# Patient Record
Sex: Female | Born: 1950 | Race: White | Hispanic: No | Marital: Married | Smoking: Former smoker
Health system: Southern US, Community
[De-identification: ages and names within clinical notes are randomized; demographics above are authoritative.]

## PROBLEM LIST (undated history)

## (undated) DIAGNOSIS — G43909 Migraine, unspecified, not intractable, without status migrainosus: Secondary | ICD-10-CM

## (undated) DIAGNOSIS — M1990 Unspecified osteoarthritis, unspecified site: Secondary | ICD-10-CM

## (undated) DIAGNOSIS — F329 Major depressive disorder, single episode, unspecified: Secondary | ICD-10-CM

## (undated) DIAGNOSIS — R0600 Dyspnea, unspecified: Secondary | ICD-10-CM

## (undated) DIAGNOSIS — Z952 Presence of prosthetic heart valve: Secondary | ICD-10-CM

## (undated) DIAGNOSIS — I4819 Other persistent atrial fibrillation: Secondary | ICD-10-CM

## (undated) DIAGNOSIS — I493 Ventricular premature depolarization: Secondary | ICD-10-CM

## (undated) DIAGNOSIS — I5022 Chronic systolic (congestive) heart failure: Secondary | ICD-10-CM

## (undated) DIAGNOSIS — I428 Other cardiomyopathies: Secondary | ICD-10-CM

## (undated) DIAGNOSIS — F419 Anxiety disorder, unspecified: Secondary | ICD-10-CM

## (undated) DIAGNOSIS — I509 Heart failure, unspecified: Secondary | ICD-10-CM

## (undated) DIAGNOSIS — F32A Depression, unspecified: Secondary | ICD-10-CM

## (undated) HISTORY — DX: Migraine, unspecified, not intractable, without status migrainosus: G43.909

## (undated) HISTORY — DX: Presence of prosthetic heart valve: Z95.2

## (undated) HISTORY — DX: Chronic systolic (congestive) heart failure: I50.22

## (undated) HISTORY — DX: Anxiety disorder, unspecified: F41.9

## (undated) HISTORY — PX: TONSILLECTOMY AND ADENOIDECTOMY: SUR1326

## (undated) HISTORY — DX: Other persistent atrial fibrillation: I48.19

## (undated) HISTORY — DX: Ventricular premature depolarization: I49.3

---

## 1998-11-06 ENCOUNTER — Other Ambulatory Visit: Admission: RE | Admit: 1998-11-06 | Discharge: 1998-11-06 | Payer: Self-pay | Admitting: Obstetrics and Gynecology

## 2003-03-15 HISTORY — PX: EYE SURGERY: SHX253

## 2003-03-18 ENCOUNTER — Other Ambulatory Visit: Admission: RE | Admit: 2003-03-18 | Discharge: 2003-03-18 | Payer: Self-pay | Admitting: Obstetrics and Gynecology

## 2004-08-30 ENCOUNTER — Other Ambulatory Visit: Admission: RE | Admit: 2004-08-30 | Discharge: 2004-08-30 | Payer: Self-pay | Admitting: Obstetrics and Gynecology

## 2005-10-26 ENCOUNTER — Other Ambulatory Visit: Admission: RE | Admit: 2005-10-26 | Discharge: 2005-10-26 | Payer: Self-pay | Admitting: Obstetrics and Gynecology

## 2006-10-23 ENCOUNTER — Encounter: Admission: RE | Admit: 2006-10-23 | Discharge: 2006-10-23 | Payer: Self-pay | Admitting: Obstetrics and Gynecology

## 2006-12-12 ENCOUNTER — Other Ambulatory Visit: Admission: RE | Admit: 2006-12-12 | Discharge: 2006-12-12 | Payer: Self-pay | Admitting: Obstetrics and Gynecology

## 2008-02-01 ENCOUNTER — Emergency Department (HOSPITAL_COMMUNITY): Admission: EM | Admit: 2008-02-01 | Discharge: 2008-02-01 | Payer: Self-pay | Admitting: Emergency Medicine

## 2008-03-28 ENCOUNTER — Other Ambulatory Visit: Admission: RE | Admit: 2008-03-28 | Discharge: 2008-03-28 | Payer: Self-pay | Admitting: Obstetrics and Gynecology

## 2010-04-04 ENCOUNTER — Encounter: Payer: Self-pay | Admitting: Obstetrics and Gynecology

## 2010-07-27 NOTE — Consult Note (Signed)
Wanda [name], Wanda [name] NO.:  [id number]   MEDICAL RECORD NO.:  [id number]          PATIENT TYPE:  EMS   LOCATION:  [facility]                         FACILITY:  [facility]   PHYSICIAN:  Nicki [name], M.D.     DATE OF BIRTH:  [birth year]   DATE OF CONSULTATION:  DATE OF DISCHARGE:  02/01/2008                                 CONSULTATION   PATIENT PROFILE:  Wanda [name] is a 60 year old female who is referred to  [facility] Emergency Room by Wanda [name] for evaluation of chest  pain.  The patient was also seen by Wanda [name] in the [facility]  Emergency Room and cardiology evaluation was requested.   HISTORY OF PRESENT ILLNESS:  Wanda [name] is a 60 year old.  She  denies any known prior history of cardiac disease.  She admits to being  very healthy for most of her life.  On Wednesday evening, 2 days ago,  she first developed an episode of chest discomfort, which seemed to be  lower substernal/epigastric associated with mild burning.  She took Tums  with relief.  On Thursday evening, she again developed recurrent  episodes of chest discomfort.  At times, it felt like a burning.  It  seemed to go up epigastric to the chest and possibly back.  She did take  Tums with some mild benefit, but not complete relief.  This persisted  for most of the night.  It was not associated with any shortness of  breath or diaphoresis.  She saw Wanda [name] this morning at Urgent Care and  presented from there to [facility] for further evaluation.  She  has been pain free through the entire day.  She was evaluated in the  emergency room by Wanda [name] who felt most likely her pain was  GI.  She was given Protonix and has not had any recurrence of  discomfort.  Upon further questioning, the patient denies any exertional  component to any of her chest pain.  She denies any exertional shortness  of breath.  There is no history of known gallbladder disease.  She  denies any history of  known pancreatitis.   She admits to being allergic to DUST.   Her current medications include a transdermal estrogen patch as well as  oral progesterone.  She denies any prior surgical illness or any  surgeries.   Socially, she is the sister of a former patient of mine, Wanda [name], who unfortunately passed away in 2009/02/16secondary to  lung CA.  He did have aortic stenosis and had undergone aortic valve  replacement surgery.  She is married for 33 years.  She has 2 children,  ages 39 and 13, who are healthy.  She does not smoke.  She only drinks  alcohol rarely on a social basis.   FAMILY HISTORY:  Notable that her mother is aged 66, living in an  assisted living and has spinal stenosis.  Father died at age 23 with an  abdominal aortic aneurysm.  She had 1 brother, Wanda Hua  Mayford [name], who  passed away.  She has 1 sister who is well.   REVIEW OF SYSTEMS:  Negative for fever, chills, or night sweats.  She  recently was started on Adderall 20 mg approximately 6 weeks ago for  possible attention deficit disorder.  She does work in the [employer].  She denies any wheezing.  She denies any  shortness of breath.  She denies any tachy palpitations.  She is unaware  of any known heart murmur.  She denies any fatty food intolerance.  She  denies abdominal pain.  She denies any GI or GU history.  She denies any  cramps in her legs with walking.  She denies any calf swelling or  tenderness.  She denies edema.  She denies dysuria.   PHYSICAL EXAMINATION:  GENERAL:  She is a very pleasant 60 year old  female who is in no acute distress and looks comfortable.  VITAL SIGNS:  Her blood pressure was 120/78, pulse was regular at 74,  and respirations were 16.  HEENT:  Unremarkable.  She was normocephalic and atraumatic.  Pupils  were equal, round, and reactive to light and accommodation.  Her mouth  and pharynx were benign.  NECK:  There was no JVD.  She did not  have thyroid nodules.  CHEST:  She did not have chest wall tenderness to palpation.  LUNGS:  Clear.  HEART:  Rhythm was regular.  There was a faint 1/6 systolic murmur along  the left sternal border.  There was no S3 gallop.  There was no S4.  ABDOMEN:  She did not have epigastric tenderness to palpation.  She did  not have right upper quadrant tenderness to palpation.  Abdominal aorta  was not increased by palpation.  EXTREMITIES:  Pulses were 2+.  She had negative Homans sign.  She did  not have any calf tenderness.  She did not have any cords.  There was no  edema.   Her EKG from Urgent Care was reviewed which showed sinus rhythm at 72  beats per minute, even though the computer had read it as accelerated  junctional rhythm, which it was not.  There were nonspecific acute  changes.   I did repeat an EKG done at [facility] Emergency Room at 1446, which  shows normal sinus rhythm.  She has mild RV conduction delay, but there  are no significant ST-T changes.   Hemoglobin was 12.1, hematocrit 36.2, platelet count 264,000, and white  count was normal at 6.7.  Sodium 141, potassium 4.8, chloride 105, CO2  of 30, glucose 87, BUN 14, and creatinine 0.89.  Live function studies  were normal.  Lipase was normal at 19.  Urinalysis was notable in that  she did have 21-50 white blood cells with few bacteria.  A culture was  sent.  Oxygen by pulse oximetry was 99% saturation.   My impression is that [name] a very pleasant 60 year old female  who denies any known cardiac history.  She recently had a 2-day history  of chest discomfort typically with lying down, on both instances after  eating and initially relieved with Tums.  She was given a Protonix  earlier today in the emergency room and has been pain free ever since.  Her subsequent EKG was normal.  We will obtain cardiac markers.  If the  cardiac markers are negative, she will then be sent home today and will  be given a  prescription for empiric Protonix 40  mg as well as baby  aspirin.  She will also be given a prescription for Macrobid for 1 week.  She will be also told to take a baby aspirin 81 mg.  If her cardiac  markers are equivocal, she will be kept overnight for serial enzymes.  We plan to perform an outpatient nuclear stress test and 2-D echo  Doppler study early next week for further evaluation to make certain  this is not on any cardiac origin.  In the future, she may ultimately  need an abdominal ultrasound/gallbladder ultrasound.  Further  recommendation will be forthcoming upon the completion of this study.           ______________________________  Nicki [name], M.D.     TK/MEDQ  D:  02/01/2008  T:  02/02/2008  Job:  [id number]   cc:   Brett Canales A. Cleta [name], M.D.

## 2010-12-15 LAB — POCT CARDIAC MARKERS
CKMB, poc: <1 — ABNORMAL LOW
Myoglobin, poc: 33.4

## 2010-12-15 LAB — COMPREHENSIVE METABOLIC PANEL
AST: 15
Albumin: 3.7
Alkaline Phosphatase: 35 — ABNORMAL LOW
Chloride: 105
GFR calc Af Amer: >60
Potassium: 4.8
Sodium: 141
Total Bilirubin: 0.7
Total Protein: 5.5 — ABNORMAL LOW

## 2010-12-15 LAB — URINALYSIS, ROUTINE W REFLEX MICROSCOPIC
Bilirubin Urine: NEGATIVE
Hgb urine dipstick: NEGATIVE
Ketones, ur: NEGATIVE
Protein, ur: NEGATIVE
Urobilinogen, UA: 0.2

## 2010-12-15 LAB — DIFFERENTIAL
Basophils Absolute: 0
Basophils Relative: 0
Eosinophils Relative: 3
Monocytes Absolute: 0.5
Monocytes Relative: 7

## 2010-12-15 LAB — CBC
Platelets: 264
RDW: 13.5
WBC: 6.7

## 2010-12-15 LAB — CK TOTAL AND CKMB (NOT AT ARMC)
CK, MB: 0.7
Total CK: 23

## 2010-12-15 LAB — URINE CULTURE: Colony Count: 1000

## 2010-12-15 LAB — URINE MICROSCOPIC-ADD ON

## 2013-04-26 ENCOUNTER — Encounter: Payer: Self-pay | Admitting: Obstetrics & Gynecology

## 2013-05-14 ENCOUNTER — Ambulatory Visit: Payer: Self-pay | Admitting: Obstetrics and Gynecology

## 2013-05-14 ENCOUNTER — Ambulatory Visit: Payer: Self-pay | Admitting: Obstetrics & Gynecology

## 2013-05-27 ENCOUNTER — Encounter: Payer: Self-pay | Admitting: Obstetrics & Gynecology

## 2013-05-28 ENCOUNTER — Other Ambulatory Visit: Payer: Self-pay | Admitting: Obstetrics & Gynecology

## 2013-05-28 ENCOUNTER — Encounter: Payer: Self-pay | Admitting: Obstetrics & Gynecology

## 2013-05-28 ENCOUNTER — Ambulatory Visit (INDEPENDENT_AMBULATORY_CARE_PROVIDER_SITE_OTHER): Payer: BC Managed Care – PPO | Admitting: Obstetrics & Gynecology

## 2013-05-28 VITALS — BP 138/62 | HR 60 | Resp 16 | Ht 64.0 in | Wt 138.0 lb

## 2013-05-28 DIAGNOSIS — Z1239 Encounter for other screening for malignant neoplasm of breast: Secondary | ICD-10-CM

## 2013-05-28 DIAGNOSIS — E559 Vitamin D deficiency, unspecified: Secondary | ICD-10-CM

## 2013-05-28 DIAGNOSIS — Z0000 Encounter for general adult medical examination without abnormal findings: Secondary | ICD-10-CM

## 2013-05-28 DIAGNOSIS — Z01419 Encounter for gynecological examination (general) (routine) without abnormal findings: Secondary | ICD-10-CM

## 2013-05-28 DIAGNOSIS — E2839 Other primary ovarian failure: Secondary | ICD-10-CM

## 2013-05-28 DIAGNOSIS — Z23 Encounter for immunization: Secondary | ICD-10-CM

## 2013-05-28 DIAGNOSIS — Z1231 Encounter for screening mammogram for malignant neoplasm of breast: Secondary | ICD-10-CM

## 2013-05-28 DIAGNOSIS — Z1211 Encounter for screening for malignant neoplasm of colon: Secondary | ICD-10-CM

## 2013-05-28 LAB — HEMOGLOBIN, FINGERSTICK: Hemoglobin, fingerstick: 12.9 g/dL (ref 12.0–16.0)

## 2013-05-28 MED ORDER — ESTRADIOL 0.05 MG/24HR TD PTTW: 1.0000 | MEDICATED_PATCH | TRANSDERMAL | Status: DC

## 2013-05-28 MED ORDER — PROGESTERONE MICRONIZED 100 MG PO CAPS: 100.0000 mg | ORAL_CAPSULE | Freq: Every day | ORAL | Status: DC

## 2013-05-28 NOTE — Patient Instructions (Signed)

## 2013-05-28 NOTE — Progress Notes (Signed)
63 y.o. G3P2 MarriedCaucasianF here for annual exam.  Has had a lot going on with family.  Has been stretching the dosage out by using the patch for more than three or four days.  No vaginal bleeding.  Doesn't have hot flashes even when she misses changing her patch.  Patient's last menstrual period was 08/12/2004.          Sexually active: yes  The current method of family planning is vasectomy.    Exercising: yes  walking and exercise classes Smoker:  no  Health Maintenance: Pap:  05/11/12 WNL/negative HR HPV History of abnormal Pap:  no MMG:  10/23/06-normal Colonoscopy:  none BMD:   none TDaP:  today Screening Labs: today, Hb today: 12.9, Urine today: WBC-trace   reports that she has quit smoking. She has never used smokeless tobacco. She reports that she drinks alcohol. She reports that she does not use illicit drugs.  Past Medical History  Diagnosis Date  . Anxiety   . Migraine     Past Surgical History  Procedure Laterality Date  . Tonsillectomy and adenoidectomy      Current Outpatient Prescriptions  Medication Sig Dispense Refill  . ALPRAZolam (XANAX) 0.25 MG tablet Take 0.25 mg by mouth at bedtime as needed for anxiety.      Marland Kitchen. amphetamine-dextroamphetamine (ADDERALL) 20 MG tablet       . estradiol (VIVELLE-DOT) 0.05 MG/24HR patch Place 1 patch onto the skin 2 (two) times a week.      Marland Kitchen. ibuprofen (ADVIL,MOTRIN) 200 MG tablet Take 200 mg by mouth every 6 (six) hours as needed.      . progesterone (PROMETRIUM) 100 MG capsule Take 100 mg by mouth daily.      . Pseudoephedrine HCl (SUDAFED PO) Take by mouth as needed.      . rizatriptan (MAXALT-MLT) 10 MG disintegrating tablet Take 10 mg by mouth as needed for migraine. May repeat in 2 hours if needed       No current facility-administered medications for this visit.    Family History  Problem Relation Age of Onset  . Diabetes Mother   . Ovarian cancer Maternal Grandmother 80  . Breast cancer Sister [id number]  . Congestive  Heart Failure Maternal Grandfather   . Heart disease Brother     valve replacement  . Deep vein thrombosis Father   . Deep vein thrombosis Brother   . Deep vein thrombosis Paternal Aunt     x2  . Deep vein thrombosis Paternal Grandmother   . Lung cancer Brother     ROS:  Pertinent items are noted in HPI.  Otherwise, a comprehensive ROS was negative.  Exam:   BP 138/62  Pulse 60  Resp 16  Ht 5\' 4"  (1.626 m)  Wt 138 lb (62.596 kg)  BMI 23.68 kg/m2  LMP 08/12/2004  Weight change: +4lbs  Height: 5\' 4"  (162.6 cm)  Ht Readings from Last 3 Encounters:  05/28/13 5\' 4"  (1.626 m)    General appearance: alert, cooperative and appears stated age Head: Normocephalic, without obvious abnormality, atraumatic Neck: no adenopathy, supple, symmetrical, trachea midline and thyroid normal to inspection and palpation Lungs: clear to auscultation bilaterally Breasts: normal appearance, no masses or tenderness Heart: regular rate and rhythm Abdomen: soft, non-tender; bowel sounds normal; no masses,  no organomegaly Extremities: extremities normal, atraumatic, no cyanosis or edema Skin: Skin color, texture, turgor normal. No rashes or lesions Lymph nodes: Cervical, supraclavicular, and axillary nodes normal. No abnormal inguinal nodes palpated Neurologic:  Grossly normal   Pelvic: External genitalia:  no lesions              Urethra:  normal appearing urethra with no masses, tenderness or lesions              Bartholins and Skenes: normal                 Vagina: normal appearing vagina with normal color and discharge, no lesions              Cervix: no lesions              Pap taken: no Bimanual Exam:  Uterus:  normal size, contour, position, consistency, mobility, non-tender              Adnexa: normal adnexa and no mass, fullness, tenderness               Rectovaginal: Confirms               Anus:  normal sphincter tone, no lesions  A:  Well Woman with normal exam PMP, on  HRT Anxiety Migraines  P:   Mammogram and BMD scheduled 06/12/13 at 2pm for pt at breast center pap smear with neg HR HPV 2/14.  No pap today. Rx for one month for minivelle 0.05mg  twice weekly and prometrium 100mg  daily.  Will RF for year when MMG is done. Tdap today. Declines colonoscopy.  Ifob given. return annually or prn  An After Visit Summary was printed and given to the patient.

## 2013-05-29 LAB — LIPID PANEL
Cholesterol: 203 mg/dL — ABNORMAL HIGH (ref 0–200)
HDL: 80 mg/dL (ref >39)
LDL CALC: 112 mg/dL — AB (ref 0–99)
TRIGLYCERIDES: 53 mg/dL (ref <150)
Total CHOL/HDL Ratio: 2.5 Ratio
VLDL: 11 mg/dL (ref 0–40)

## 2013-05-29 LAB — COMPREHENSIVE METABOLIC PANEL
ALBUMIN: 4.6 g/dL (ref 3.5–5.2)
ALT: 9 U/L (ref 0–35)
AST: 13 U/L (ref 0–37)
Alkaline Phosphatase: 41 U/L (ref 39–117)
BUN: 12 mg/dL (ref 6–23)
CALCIUM: 9 mg/dL (ref 8.4–10.5)
CHLORIDE: 104 meq/L (ref 96–112)
CO2: 26 mEq/L (ref 19–32)
Creat: 1.05 mg/dL (ref 0.50–1.10)
GLUCOSE: 87 mg/dL (ref 70–99)
POTASSIUM: 4.3 meq/L (ref 3.5–5.3)
Sodium: 138 mEq/L (ref 135–145)
Total Bilirubin: 0.4 mg/dL (ref 0.2–1.2)
Total Protein: 6.7 g/dL (ref 6.0–8.3)

## 2013-05-29 LAB — TSH: TSH: 1.554 u[IU]/mL (ref 0.350–4.500)

## 2013-05-29 LAB — VITAMIN D 25 HYDROXY (VIT D DEFICIENCY, FRACTURES): Vit D, 25-Hydroxy: 12 ng/mL — ABNORMAL LOW (ref 30–89)

## 2013-06-04 MED ORDER — VITAMIN D (ERGOCALCIFEROL) 1.25 MG (50000 UNIT) PO CAPS: 50000.0000 [IU] | ORAL_CAPSULE | ORAL | Status: DC

## 2013-06-10 ENCOUNTER — Telehealth: Payer: Self-pay | Admitting: Obstetrics & Gynecology

## 2013-06-10 NOTE — Telephone Encounter (Signed)
Patient calling requesting refills for generic Maxalt be sent to [facility].

## 2013-06-10 NOTE — Telephone Encounter (Signed)
Patient also requesting information on where we scheduled her for her screening MMG this year? She says we scheduled it for her at the last visit.

## 2013-06-10 NOTE — Telephone Encounter (Signed)
Dr. Hyacinth Meeker, can you review? Patient requests refill for Maxalt.   Triage RN: Patient scheduled [facility] for 06/12/13 at 1400.

## 2013-06-11 MED ORDER — RIZATRIPTAN BENZOATE 10 MG PO TBDP: 10.0000 mg | ORAL_TABLET | ORAL | Status: DC | PRN

## 2013-06-11 NOTE — Telephone Encounter (Signed)
Spoke with patient. Patient advised that she has her Mammogram and Dexa scheduled at the [facility] for 06/12/13 at 1400 and that her refill for Maxalt was sent to [facility]. Patient verbalizes understanding and is agreeable.  Routing to provider for final review. Patient agreeable to disposition. Will close encounter

## 2013-06-11 NOTE — Telephone Encounter (Signed)
RX refill for maxalt done to [facility].

## 2013-06-12 ENCOUNTER — Ambulatory Visit
Admission: RE | Admit: 2013-06-12 | Discharge: 2013-06-12 | Disposition: A | Discharge status: Home / Self Care | Payer: BC Managed Care – PPO | Source: Ambulatory Visit | Attending: Obstetrics & Gynecology | Admitting: Obstetrics & Gynecology

## 2013-06-12 DIAGNOSIS — Z1231 Encounter for screening mammogram for malignant neoplasm of breast: Secondary | ICD-10-CM

## 2013-06-12 DIAGNOSIS — E2839 Other primary ovarian failure: Secondary | ICD-10-CM

## 2013-06-14 ENCOUNTER — Other Ambulatory Visit: Payer: Self-pay | Admitting: Obstetrics & Gynecology

## 2013-06-14 DIAGNOSIS — R928 Other abnormal and inconclusive findings on diagnostic imaging of breast: Secondary | ICD-10-CM

## 2013-06-20 ENCOUNTER — Ambulatory Visit
Admission: RE | Admit: 2013-06-20 | Discharge: 2013-06-20 | Disposition: A | Discharge status: Home / Self Care | Payer: BC Managed Care – PPO | Source: Ambulatory Visit | Attending: Obstetrics & Gynecology | Admitting: Obstetrics & Gynecology

## 2013-06-20 DIAGNOSIS — R928 Other abnormal and inconclusive findings on diagnostic imaging of breast: Secondary | ICD-10-CM

## 2013-06-26 ENCOUNTER — Other Ambulatory Visit: Payer: BC Managed Care – PPO

## 2013-07-19 ENCOUNTER — Other Ambulatory Visit: Payer: Self-pay | Admitting: Obstetrics & Gynecology

## 2013-07-19 NOTE — Telephone Encounter (Signed)
eScribe request from [facility] for refill on PROMETRIUM & MINIVELLE Last filled - 05/28/13 X 1 MONTH Last AEX - 05/28/13 Next AEX - 06/02/14 Diagnostic MMG and [place] on 06/20/13 showed benign findings.   Per last OV note:   Rx for one month for minivelle 0.05mg  twice weekly and prometrium 100mg  daily. Will RF for year when MMG is done.  Please advise refills.

## 2013-07-26 ENCOUNTER — Telehealth: Payer: Self-pay | Admitting: Obstetrics & Gynecology

## 2013-07-26 NOTE — Telephone Encounter (Signed)
Patient said she got a letter from [facility] saying to call the office about results from bone density.

## 2013-07-30 NOTE — Telephone Encounter (Signed)
Attempted to call patient on mobile. Unable to leave message. Voicemail not set up.

## 2013-07-30 NOTE — Telephone Encounter (Signed)
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  Hips have a little more osteopenia present.  Needs to get 1200-1500mg  total calcium (dietary and supplement) and 800 IU Vit D.  Recheck BMD 2 years ------

## 2013-07-30 NOTE — Telephone Encounter (Signed)
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 Hips have a little more osteopenia present. Needs to get 1200-1500mg  total calcium (dietary and supplement) and 800 IU Vit D. Recheck BMD 2 years

## 2013-07-31 NOTE — Telephone Encounter (Signed)
Call to patient. She was given message from Dr. Hyacinth Meeker.  She declines to schedule follow up vitamin D level check. She states she is taking the weekly vitamin D but not a daily calcium or a daily vitamin D. Discussed need for daily calcium and vitamin D for good bone health.   She will call back to schedule recheck of vitamin D.   Routing to provider for final review. Patient agreeable to disposition. Will close encounter

## 2013-09-03 ENCOUNTER — Telehealth: Payer: Self-pay | Admitting: Obstetrics & Gynecology

## 2013-09-03 NOTE — Telephone Encounter (Signed)
Routing to Dr. Hyacinth Meeker as Wanda [name]. Patient to reception states she will call back to reschedule. Will close encounter.

## 2013-09-03 NOTE — Telephone Encounter (Signed)
Patient cancel her vit d check lab for thrusday said she would call back to reschedule but wanted to make sure it didn't fall through.

## 2013-09-05 ENCOUNTER — Other Ambulatory Visit: Payer: BC Managed Care – PPO

## 2013-09-23 ENCOUNTER — Encounter: Payer: Self-pay | Admitting: Obstetrics & Gynecology

## 2013-09-23 NOTE — Progress Notes (Signed)
Pt did not return ifob kit.

## 2013-10-10 ENCOUNTER — Telehealth: Payer: Self-pay

## 2013-10-10 NOTE — Telephone Encounter (Signed)
Spoke with patient-she was at the hospital with in laws-? Father in law or brother in law not doing well. I apologized for catching her there and let her know if there is anything she needs from [place] to call. She appreciated the call with the Vitamin D recheck reminder. States she will try to remember to call back to schedule this when things slow down. Should I keep this in my reminder for a couple of months and then call back or take out of box? Please advise.

## 2013-10-10 NOTE — Telephone Encounter (Signed)
Message copied by Elisha Headland on Thu Oct 10, 2013  1:19 PM ------      Message from: Jerene Bears      Created: Wed Oct 09, 2013 10:46 AM      Regarding: labs       Pt hasn't come for follow up Vit D check.  Order is placed.  Can you call her please?            MSM ------

## 2013-10-10 NOTE — Telephone Encounter (Signed)
OK to remove from in box.  Thank you for calling.

## 2013-12-17 ENCOUNTER — Telehealth: Payer: Self-pay

## 2013-12-17 NOTE — Telephone Encounter (Signed)
Message copied by Elisha Headland on Tue Dec 17, 2013  9:42 AM ------      Message from: Jerene Bears      Created: Mon Nov 25, 2013  7:12 AM      Regarding: Vit D       Pt hasn't come for follow up Vit D level.  Can you call her please?  Order is placed.  Thanks.            MSM ------

## 2013-12-17 NOTE — Telephone Encounter (Signed)
Spoke with patient-states she is aware that this needs to be checked and is not trying to make excuses, but her father in law did pass and her mother in law is not doing well. She had her mammogram and was recalled for extra views. States she has been dealing with a lot of things and completely forgot about the vitamin d recheck. She will call back to schedule this when she has a day open. Aware can call same day and get in.//kn

## 2013-12-17 NOTE — Telephone Encounter (Signed)
Thank you. Encounter closed. 

## 2014-01-13 ENCOUNTER — Encounter: Payer: Self-pay | Admitting: Obstetrics & Gynecology

## 2014-06-02 ENCOUNTER — Telehealth: Payer: Self-pay | Admitting: Obstetrics & Gynecology

## 2014-06-02 ENCOUNTER — Ambulatory Visit: Payer: Self-pay | Admitting: Obstetrics & Gynecology

## 2014-06-02 NOTE — Telephone Encounter (Signed)
Unable to reach patient to reschedule her AEX for today with Dr. Hyacinth Meeker that was cancelled due to a surgery conflict.

## 2014-06-09 ENCOUNTER — Ambulatory Visit (INDEPENDENT_AMBULATORY_CARE_PROVIDER_SITE_OTHER): Payer: BC Managed Care – PPO | Admitting: Obstetrics & Gynecology

## 2014-06-09 ENCOUNTER — Encounter: Payer: Self-pay | Admitting: Obstetrics & Gynecology

## 2014-06-09 VITALS — BP 128/66 | HR 84 | Resp 16 | Ht 64.0 in | Wt 137.0 lb

## 2014-06-09 DIAGNOSIS — Z1211 Encounter for screening for malignant neoplasm of colon: Secondary | ICD-10-CM

## 2014-06-09 DIAGNOSIS — Z124 Encounter for screening for malignant neoplasm of cervix: Secondary | ICD-10-CM

## 2014-06-09 DIAGNOSIS — Z01419 Encounter for gynecological examination (general) (routine) without abnormal findings: Secondary | ICD-10-CM | POA: Diagnosis not present

## 2014-06-09 DIAGNOSIS — E559 Vitamin D deficiency, unspecified: Secondary | ICD-10-CM

## 2014-06-09 DIAGNOSIS — Z0000 Encounter for general adult medical examination without abnormal findings: Secondary | ICD-10-CM

## 2014-06-09 LAB — POCT URINALYSIS DIPSTICK
BILIRUBIN UA: NEGATIVE
GLUCOSE UA: NEGATIVE
Ketones, UA: NEGATIVE
Leukocytes, UA: NEGATIVE
Nitrite, UA: NEGATIVE
PH UA: 7
Protein, UA: NEGATIVE
RBC UA: NEGATIVE
Urobilinogen, UA: NEGATIVE

## 2014-06-09 MED ORDER — RIZATRIPTAN BENZOATE 10 MG PO TBDP: 10.0000 mg | ORAL_TABLET | ORAL | Status: DC | PRN

## 2014-06-09 MED ORDER — ESTRADIOL 0.05 MG/24HR TD PTTW: MEDICATED_PATCH | TRANSDERMAL | Status: DC

## 2014-06-09 MED ORDER — PROGESTERONE MICRONIZED 100 MG PO CAPS: ORAL_CAPSULE | ORAL | Status: DC

## 2014-06-09 NOTE — Progress Notes (Signed)
64 y.o. G3P2 MarriedCaucasianF here for annual exam.  Doing well.  No emergent or urgent care issues this past year.  D/w pt last year's MMG and breast density finding.  Reports the last year was hard as she lost her father-in-law and her mother.  They lived here so she provided a lot of care.    Denies vaginal bleeding.    Patient's last menstrual period was 08/12/2004.          Sexually active: Yes.    The current method of family planning is post menopausal status.    Exercising: Yes.    Walking, cardio class Smoker:  no  Health Maintenance: Pap: 04/2012 neg. HR HPV:Neg History of abnormal Pap:  no MMG: 06/20/13 [place] Right BIRADS2:Benign  Self Breast Exam: yes, every month.  Colonoscopy:  Never BMD:  06/2013, 0.3/-2.0 TDaP: 2015 Screening Labs: Will discuss with provider first, Hb today: , Urine today: pending   reports that she has quit smoking. She has never used smokeless tobacco. She reports that she drinks alcohol. She reports that she does not use illicit drugs.  Past Medical History  Diagnosis Date  . Anxiety   . Migraine     Past Surgical History  Procedure Laterality Date  . Tonsillectomy and adenoidectomy      Current Outpatient Prescriptions  Medication Sig Dispense Refill  . amphetamine-dextroamphetamine (ADDERALL) 20 MG tablet     . ibuprofen (ADVIL,MOTRIN) 200 MG tablet Take 200 mg by mouth every 6 (six) hours as needed.    Marland Kitchen LORazepam (ATIVAN) 0.5 MG tablet     . MINIVELLE 0.05 MG/24HR patch APPLY 1 PATCH 2 TIMES A WEEK AS DIRECTED. 8 patch 10  . Multiple Vitamin (MULTIVITAMIN) tablet Take 1 tablet by mouth daily.    . progesterone (PROMETRIUM) 100 MG capsule TAKE 1 CAPSULE AT BEDTIME. 30 capsule 10  . Pseudoephedrine HCl (SUDAFED PO) Take by mouth as needed.    . rizatriptan (MAXALT-MLT) 10 MG disintegrating tablet Take 1 tablet (10 mg total) by mouth as needed for migraine. May repeat in 2 hours if needed 9 tablet 11   No current facility-administered  medications for this visit.    Family History  Problem Relation Age of Onset  . Diabetes Mother   . Ovarian cancer Maternal Grandmother 80  . Breast cancer Sister 67  . Congestive Heart Failure Maternal Grandfather   . Heart disease Brother     valve replacement  . Deep vein thrombosis Father   . Deep vein thrombosis Brother   . Deep vein thrombosis Paternal Aunt     x2  . Deep vein thrombosis Paternal Grandmother   . Lung cancer Brother     ROS:  Pertinent items are noted in HPI.  Otherwise, a comprehensive ROS was negative.  Exam:    General appearance: alert, cooperative and appears stated age Head: Normocephalic, without obvious abnormality, atraumatic Neck: no adenopathy, supple, symmetrical, trachea midline and thyroid normal to inspection and palpation Lungs: clear to auscultation bilaterally Breasts: normal appearance, no masses or tenderness Heart: regular rate and rhythm Abdomen: soft, non-tender; bowel sounds normal; no masses,  no organomegaly Extremities: extremities normal, atraumatic, no cyanosis or edema Skin: Skin color, texture, turgor normal. No rashes or lesions Lymph nodes: Cervical, supraclavicular, and axillary nodes normal. No abnormal inguinal nodes palpated Neurologic: Grossly normal   Pelvic: External genitalia:  no lesions              Urethra:  normal appearing urethra with  no masses, tenderness or lesions              Bartholins and Skenes: normal                 Vagina: normal appearing vagina with normal color and discharge, no lesions              Cervix: no lesions              Pap taken: Yes.   Bimanual Exam:  Uterus:  normal size, contour, position, consistency, mobility, non-tender              Adnexa: normal adnexa and no mass, fullness, tenderness               Rectovaginal: Confirms               Anus:  normal sphincter tone, no lesions  Chaperone was present for exam.  A:  Well Woman with normal exam PMP, on  HRT Anxiety Migraines Vit D deficiency   P: Mammogram yearly recommended.  D/W pt 3D MMG due to breast density pap smear with neg HR HPV 2/14. Pap today. RF for Minivelle 0.05mg  patches twice weekly and Prometrium  nightly.  RFs x 1 year. Declines colonoscopy. Ifob given.  Was given last year but she didn't return. Lipids, CMP, TSH last year.  Repeat Vit D today. Maxalt MLT  prn migraine, may repeat 48 hrs.  #9/13 RF. return annually or prn

## 2014-06-10 LAB — VITAMIN D 25 HYDROXY (VIT D DEFICIENCY, FRACTURES): VIT D 25 HYDROXY: 23 ng/mL — AB (ref 30–100)

## 2014-06-11 LAB — IPS PAP TEST WITH REFLEX TO HPV

## 2014-07-06 ENCOUNTER — Encounter: Payer: Self-pay | Admitting: Obstetrics & Gynecology

## 2014-07-06 NOTE — Progress Notes (Signed)
Fecal occult test not returned.  Letter written to be mailed 07/07/14.

## 2014-08-05 NOTE — Progress Notes (Signed)
Fecal occult testing not returned.  Letter was written to remind pt. 

## 2015-06-16 ENCOUNTER — Other Ambulatory Visit: Payer: Self-pay | Admitting: Obstetrics & Gynecology

## 2015-06-16 NOTE — Telephone Encounter (Signed)
Medication refill request: Minivelle 0.05 mg ; Progesterone 100 mg Last AEX:  06/09/2014 with SM  Next AEX: 08/18/15 with SM Last MMG (if hormonal medication request): 06/20/2013 right breast mass ; right breast u/s showed a simple appearing cyst in the right breast; no evidence of malignancy bi-rads 2: benign Refill authorized: Please advise

## 2015-07-28 ENCOUNTER — Other Ambulatory Visit: Payer: Self-pay | Admitting: Obstetrics & Gynecology

## 2015-07-28 NOTE — Telephone Encounter (Signed)
Medication refill request: Maxalt   Last AEX:  06-09-14  Next AEX: 08-18-15 Last MMG (if hormonal medication request): 06-20-13 U/S WNL  Refill authorized: please advise

## 2015-08-18 ENCOUNTER — Encounter: Payer: Self-pay | Admitting: Obstetrics & Gynecology

## 2015-08-18 ENCOUNTER — Ambulatory Visit (INDEPENDENT_AMBULATORY_CARE_PROVIDER_SITE_OTHER): Payer: BC Managed Care – PPO | Admitting: Obstetrics & Gynecology

## 2015-08-18 VITALS — BP 144/60 | HR 72 | Resp 18 | Ht 63.5 in | Wt 140.0 lb

## 2015-08-18 DIAGNOSIS — Z0000 Encounter for general adult medical examination without abnormal findings: Secondary | ICD-10-CM

## 2015-08-18 DIAGNOSIS — Z205 Contact with and (suspected) exposure to viral hepatitis: Secondary | ICD-10-CM

## 2015-08-18 DIAGNOSIS — Z01419 Encounter for gynecological examination (general) (routine) without abnormal findings: Secondary | ICD-10-CM | POA: Diagnosis not present

## 2015-08-18 DIAGNOSIS — Z1211 Encounter for screening for malignant neoplasm of colon: Secondary | ICD-10-CM

## 2015-08-18 LAB — HEMOCCULT GUIAC POC 1CARD (OFFICE): Fecal Occult Blood, POC: NEGATIVE

## 2015-08-18 LAB — COMPREHENSIVE METABOLIC PANEL
ALBUMIN: 4.6 g/dL (ref 3.6–5.1)
ALT: 12 U/L (ref 6–29)
AST: 16 U/L (ref 10–35)
Alkaline Phosphatase: 40 U/L (ref 33–130)
BILIRUBIN TOTAL: 0.4 mg/dL (ref 0.2–1.2)
BUN: 21 mg/dL (ref 7–25)
CALCIUM: 9.7 mg/dL (ref 8.6–10.4)
CHLORIDE: 104 mmol/L (ref 98–110)
CO2: 25 mmol/L (ref 20–31)
CREATININE: 1.19 mg/dL — AB (ref 0.50–0.99)
Glucose, Bld: 88 mg/dL (ref 65–99)
Potassium: 5.2 mmol/L (ref 3.5–5.3)
SODIUM: 139 mmol/L (ref 135–146)
TOTAL PROTEIN: 6.9 g/dL (ref 6.1–8.1)

## 2015-08-18 LAB — LIPID PANEL
CHOLESTEROL: 247 mg/dL — AB (ref 125–200)
HDL: 108 mg/dL (ref >=46)
LDL Cholesterol: 124 mg/dL (ref <130)
Total CHOL/HDL Ratio: 2.3 Ratio (ref <=5.0)
Triglycerides: 73 mg/dL (ref <150)
VLDL: 15 mg/dL (ref <30)

## 2015-08-18 LAB — CBC
HCT: 40.4 % (ref 35.0–45.0)
Hemoglobin: 13.6 g/dL (ref 11.7–15.5)
MCH: 30.9 pg (ref 27.0–33.0)
MCHC: 33.7 g/dL (ref 32.0–36.0)
MCV: 91.8 fL (ref 80.0–100.0)
MPV: 11.4 fL (ref 7.5–12.5)
PLATELETS: 291 10*3/uL (ref 140–400)
RBC: 4.4 MIL/uL (ref 3.80–5.10)
RDW: 13.3 % (ref 11.0–15.0)
WBC: 7.1 10*3/uL (ref 3.8–10.8)

## 2015-08-18 LAB — TSH: TSH: 1.39 m[IU]/L

## 2015-08-18 MED ORDER — RIZATRIPTAN BENZOATE 10 MG PO TBDP: ORAL_TABLET | ORAL | Status: DC

## 2015-08-18 MED ORDER — ESTRADIOL 0.05 MG/24HR TD PTTW: 1.0000 | MEDICATED_PATCH | TRANSDERMAL | Status: DC

## 2015-08-18 MED ORDER — PROGESTERONE MICRONIZED 100 MG PO CAPS: ORAL_CAPSULE | ORAL | Status: DC

## 2015-08-18 MED ORDER — CYCLOBENZAPRINE HCL 10 MG PO TABS: 10.0000 mg | ORAL_TABLET | Freq: Three times a day (TID) | ORAL | Status: DC | PRN

## 2015-08-18 NOTE — Progress Notes (Signed)
65 y.o. G3P2 MarriedCaucasianF here for annual exam.  Having some anxiety today.  Woke up this morning and has a "still neck".  Reports she is having trouble moving her neck due to the pain.  Declines starting pneumonia vaccines at this time.  Has declined Zoster vaccine.    Continues to decline colonoscopy testing.  Declines doing stool test for blood at home.    Patient's last menstrual period was 08/12/2004.          Sexually active: No.  The current method of family planning is post menopausal status.    Exercising: Yes.    cardio, weights Smoker:  no  Health Maintenance: Pap:  06/09/14 Neg. 04/2012 Neg. HR HPV:neg History of abnormal Pap:  yes MMG:  06/20/13 [place] Right BIRADS2:benign  Colonoscopy:  Never BMD:   06/13/13 Osteopenia  TDaP:  05/2013 Pneumonia vaccine(s):  No Zostavax:   No Hep C testing: No Screening Labs: Here, Urine today: Unable to void    reports that she has quit smoking. She has never used smokeless tobacco. She reports that she drinks alcohol. She reports that she does not use illicit drugs.  Past Medical History  Diagnosis Date  . Anxiety   . Migraine     Past Surgical History  Procedure Laterality Date  . Tonsillectomy and adenoidectomy      Current Outpatient Prescriptions  Medication Sig Dispense Refill  . EVEKEO 10 MG TABS     . ibuprofen (ADVIL,MOTRIN) 200 MG tablet Take 200 mg by mouth every 6 (six) hours as needed.    Marland Kitchen LORazepam (ATIVAN) 0.5 MG tablet Take 0.5 mg by mouth as needed.     Marland Kitchen MINIVELLE 0.05 MG/24HR patch APPLY 1 PATCH 2 TIMES A WEEK AS DIRECTED. 8 patch 2  . Multiple Vitamin (MULTIVITAMIN) tablet Take 1 tablet by mouth daily.    . progesterone (PROMETRIUM) 100 MG capsule TAKE 1 CAPSULE AT BEDTIME. 30 capsule 2  . Pseudoephedrine HCl (SUDAFED PO) Take by mouth as needed.    . rizatriptan (MAXALT-MLT) 10 MG disintegrating tablet TAKE 1 TABLET AT ONSET OF HEADACHE AND 1 TABLET 2 HOURS LATER IF NEEDED. 9 tablet 0  .  amphetamine-dextroamphetamine (ADDERALL) 20 MG tablet Take 20 mg by mouth daily. Reported on 08/18/2015     No current facility-administered medications for this visit.    Family History  Problem Relation Age of Onset  . Diabetes Mother   . Ovarian cancer Maternal Grandmother 80  . Breast cancer Sister 35  . Congestive Heart Failure Maternal Grandfather   . Heart disease Brother     valve replacement  . Deep vein thrombosis Father   . Deep vein thrombosis Brother   . Deep vein thrombosis Paternal Aunt     x2  . Deep vein thrombosis Paternal Grandmother   . Lung cancer Brother     ROS:  Pertinent items are noted in HPI.  Otherwise, a comprehensive ROS was negative.  Exam:   BP 144/60 mmHg  Pulse 72  Resp 18  Ht 5' 3.5" (1.613 m)  Wt 140 lb (63.504 kg)  BMI 24.41 kg/m2  LMP 08/12/2004  Weight change: +2#  Height: 5' 3.5" (161.3 cm)  Ht Readings from Last 3 Encounters:  08/18/15 5' 3.5" (1.613 m)  06/09/14  (1.626 m)  05/28/13  (1.626 m)    General appearance: alert, cooperative and appears stated age Head: Normocephalic, without obvious abnormality, atraumatic Neck: no adenopathy, supple, symmetrical, trachea midline and thyroid normal  to inspection and palpation Lungs: clear to auscultation bilaterally Breasts: normal appearance, no masses or tenderness Heart: regular rate and rhythm Abdomen: soft, non-tender; bowel sounds normal; no masses,  no organomegaly Extremities: extremities normal, atraumatic, no cyanosis or edema Skin: Skin color, texture, turgor normal. No rashes or lesions Lymph nodes: Cervical, supraclavicular, and axillary nodes normal. No abnormal inguinal nodes palpated Neurologic: Grossly normal   Pelvic: External genitalia:  no lesions              Urethra:  normal appearing urethra with no masses, tenderness or lesions              Bartholins and Skenes: normal                 Vagina: normal appearing vagina with normal color and  discharge, no lesions              Cervix: no lesions              Pap taken: No. Bimanual Exam:  Uterus:  normal size, contour, position, consistency, mobility, non-tender              Adnexa: normal adnexa and no mass, fullness, tenderness               Rectovaginal: Confirms               Anus:  normal sphincter tone, no lesions  Chaperone was present for exam.  A:  Well Woman with normal exam PMP, on HRT Anxiety Migraines Neck pain Vit D deficiency  Skipped heart beats  P: Mammogram yearly recommended. D/W pt 3D MMG due to breast density pap smear with neg HR HPV 2/14. Pap today. RF for Minivelle 0.05mg  patches twice weekly and Prometrium 100mg  nightly. RFs x 1 year.  Pt does not want change dosage at this time.  Risks and benefits reviewed.  She is clearly aware and desires to continue. Declines colonoscopy. Ifob given. Was given last year but she didn't return. Lipids, CMP, TSH, CBC, Vit D.  Hep C antibody today. Declines starting pneumonia or zoster vaccine Maxalt 10mg  po x 1, repeat 2 hours prn migraine, may repeat 48 hrs. #9/5 RF. Called pt's PCP today for pt to be seen this week, if possible Flexeril 10mg  up ot TID for neck pain.  #30/0RF return annually or prn

## 2015-08-18 NOTE — Addendum Note (Signed)
Addended by: Jerene Bears on: 08/18/2015 03:58 PM   Modules accepted: Kipp Brood

## 2015-08-18 NOTE — Progress Notes (Signed)
Scheduled patient while in office for primary care appointment with Dr.Betty [place]and at [facility] at [place] on 6/7/207 at 8 am. She is agreeable to date and time.

## 2015-08-19 ENCOUNTER — Ambulatory Visit (INDEPENDENT_AMBULATORY_CARE_PROVIDER_SITE_OTHER): Payer: BC Managed Care – PPO | Admitting: Family Medicine

## 2015-08-19 ENCOUNTER — Encounter: Payer: Self-pay | Admitting: Family Medicine

## 2015-08-19 VITALS — BP 110/60 | HR 76 | Temp 98.6°F | Resp 12 | Ht 63.5 in | Wt 141.6 lb

## 2015-08-19 DIAGNOSIS — R944 Abnormal results of kidney function studies: Secondary | ICD-10-CM | POA: Diagnosis not present

## 2015-08-19 DIAGNOSIS — I493 Ventricular premature depolarization: Secondary | ICD-10-CM | POA: Diagnosis not present

## 2015-08-19 DIAGNOSIS — R010 Benign and innocent cardiac murmurs: Secondary | ICD-10-CM

## 2015-08-19 DIAGNOSIS — R011 Cardiac murmur, unspecified: Secondary | ICD-10-CM

## 2015-08-19 LAB — VITAMIN D 25 HYDROXY (VIT D DEFICIENCY, FRACTURES): VIT D 25 HYDROXY: 21 ng/mL — AB (ref 30–100)

## 2015-08-19 LAB — HEPATITIS C ANTIBODY: HCV Ab: NEGATIVE

## 2015-08-19 NOTE — Patient Instructions (Addendum)
A few things to remember from today's visit:   1. PVC (premature ventricular contraction)  - EKG 12-Lead  2. Heart murmur previously undiagnosed  Today physical exam was otherwise normal, except for heart normal. Because some abnormalities on your EKG today plus some shortness of breath when climbing stairs plus physical findings I think is appropriate to arrange cardiology appointment. Decrease caffeine consumption, avoid over-the-counter decongestants/cold medications, and seek immediate medical attention if any chest pain or other warning signs as discussed.  Labs done yesterday reviewed, no major abnormalities except for kidney (creatinine) slightly above normal. This can be re-checked in 3-4 months or before any test recommended by cardiologists.    If you sign-up for My chart, you can communicate easier with [place] in case you have any question or concern.

## 2015-08-19 NOTE — Progress Notes (Signed)
HPI:   Ms. Wanda [name] is a 65 y.o. female, who is here today concerned about "PVC" heard on examination during her routine gyn OV. According to pt, she was told to arrange appt with PCP to address this issue.  She is otherwise healthy except for mild hypercholesterinemia,migraine, and anxiety.  She exercises regularly(low impact and stretching exercises) about twice per week, no problems doing so. No history of hypertension or any heart disease. FHX negative for CAD and CVA. + CHF among aunts, brother had "electric" heart abnormality. Denies severe/frequent headache, visual changes, chest pain, dyspnea, palpitation, claudication, focal weakness, or edema.   She mentions that about 3-4 years ago she had an EKG done in an acute care facility because she was having mid chest pain, it was reported as "fine", she hasn't had any problems since then. She reports drinking 2 cups of coffee daily. Some shortness of breath when she climbs stairs for the past year, stable, attributed to age. No associated palpitations, dizziness, or diaphoresis.  She had labs done recently as part of her physical. She has Hx of ADHD and anxiety, on Evekeo and Lorazepam prn; currently she follows with psychiatrist. Leanora Ivanoff OTC Sudafed as needed for allergies, not sure if she has taken it recently.    Chemistry      Component Value Date/Time   NA 139 08/18/2015 1600   K 5.2 08/18/2015 1600   CL 104 08/18/2015 1600   CO2 25 08/18/2015 1600   BUN 21 08/18/2015 1600   CREATININE 1.19* 08/18/2015 1600   CREATININE 0.89 02/01/2008 1205      Component Value Date/Time   CALCIUM 9.7 08/18/2015 1600   ALKPHOS 40 08/18/2015 1600   AST 16 08/18/2015 1600   ALT 12 08/18/2015 1600   BILITOT 0.4 08/18/2015 1600     Lab Results  Component Value Date   TSH 1.39 08/18/2015     Review of Systems  Constitutional: Negative for fever, activity change, appetite change, fatigue and unexpected weight change.  HENT:  Negative for mouth sores, nosebleeds and trouble swallowing.   Eyes: Negative for redness and visual disturbance.  Respiratory: Negative for cough, shortness of breath and wheezing.   Cardiovascular: Negative for chest pain, palpitations and leg swelling.  Gastrointestinal: Negative for nausea, vomiting and abdominal pain.       Negative for changes in bowel habits.  Endocrine: Negative for cold intolerance and heat intolerance.  Genitourinary: Negative for dysuria, hematuria, decreased urine volume and difficulty urinating.  Skin: Negative for color change and rash.  Allergic/Immunologic: Positive for environmental allergies.  Neurological: Negative for dizziness, seizures, syncope, weakness, numbness and headaches.  Psychiatric/Behavioral: Negative for confusion and sleep disturbance. The patient is nervous/anxious.       Current Outpatient Prescriptions on File Prior to Visit  Medication Sig Dispense Refill  . cyclobenzaprine (FLEXERIL) 10 MG tablet Take 1 tablet (10 mg total) by mouth 3 (three) times daily as needed for muscle spasms. 30 tablet 0  . estradiol (MINIVELLE) 0.05 MG/24HR patch Place 1 patch (0.05 mg total) onto the skin 2 (two) times a week. 8 patch 13  . EVEKEO 10 MG TABS     . ibuprofen (ADVIL,MOTRIN) 200 MG tablet Take 200 mg by mouth every 6 (six) hours as needed.    Marland Kitchen LORazepam (ATIVAN) 0.5 MG tablet Take 0.5 mg by mouth as needed.     . Multiple Vitamin (MULTIVITAMIN) tablet Take 1 tablet by mouth daily.    Marland Kitchen  progesterone (PROMETRIUM) 100 MG capsule TAKE 1 CAPSULE AT BEDTIME. 30 capsule 13  . Pseudoephedrine HCl (SUDAFED PO) Take by mouth as needed.    . rizatriptan (MAXALT-MLT) 10 MG disintegrating tablet TAKE 1 TABLET AT ONSET OF HEADACHE AND 1 TABLET 2 HOURS LATER IF NEEDED. 9 tablet 5  . amphetamine-dextroamphetamine (ADDERALL) 20 MG tablet Take 20 mg by mouth daily. Reported on 08/18/2015     No current facility-administered medications on file prior to visit.       Past Medical History  Diagnosis Date  . Anxiety   . Migraine    Allergies  Allergen Reactions  . Codeine     nausea    Social History   Social History  . Marital Status: Married    Spouse Name: N/A  . Number of Children: N/A  . Years of Education: N/A   Social History Main Topics  . Smoking status: Former Games developer  . Smokeless tobacco: Never Used     Comment: in college  . Alcohol Use: Yes     Comment: 1-2 glasses of wine  . Drug Use: No  . Sexual Activity:    Partners: Male    Birth Control/ Protection: Other-see comments, Post-menopausal     Comment: vasectomy   Other Topics Concern  . None   Social History Narrative    Filed Vitals:   08/19/15 0815  BP: 110/60  Pulse: 76  Temp: 98.6 F (37 C)  Resp: 12   Body mass index is 24.69 kg/(m^2).  SpO2 Readings from Last 3 Encounters:  08/19/15 98%       Physical Exam  Constitutional: She is oriented to person, place, and time. She appears well-developed and well-nourished. She does not appear ill. No distress.  HENT:  Head: Atraumatic.  Eyes: Conjunctivae and EOM are normal. Pupils are equal, round, and reactive to light.  Neck: No thyromegaly present.  Cardiovascular: Normal rate and regular rhythm.   Murmur heard. Pulses:      Dorsalis pedis pulses are 2+ on the right side, and 2+ on the left side.  Diastolic I-II/VI heard mainly on LUSB, softer on RUSB. Soft SEM also heard RUSB.  Respiratory: Effort normal and breath sounds normal. No respiratory distress.  GI: Soft. She exhibits no mass. There is no tenderness.  Musculoskeletal: She exhibits no edema.  Lymphadenopathy:    She has no cervical adenopathy.  Neurological: She is alert and oriented to person, place, and time.  Skin: Skin is warm. No rash noted.  Psychiatric: She has a normal mood and affect. Her speech is normal.  Well groomed, good eye contact.     ASSESSMENT AND PLAN:     Merilee was seen today for new patient (initial  visit).  Diagnoses and all orders for this visit:  PVC (premature ventricular contraction)  Possible causes discussed, I didn't noted on auscultation but on EKG.       SR, LAD ? LAFB, incomplete RBBB, PVC. No other EKG available. Asymptomatic. Recommended decreasing caffeine intake and avoiding over-the-counter medications. Instructed about warning signs.  -     EKG 12-Lead -     Ambulatory referral to Cardiology  Heart murmur previously undiagnosed  Is not aware of history of heart murmur, I think it is mainly diastolic. Because abnormal EKG plus she is reporting some shortness of breath upon climbing stairs I think cardiology referral is appropriate at this time. Clearly instructed about warning signs.  -     Ambulatory referral to Cardiology  Abnormal renal function test  Caution with NSAID's. Will repeat in 3-4 months.  -     Basic metabolic panel; Future           -She was advised to return or notify a doctor immediately if symptoms worsen or persist or new concerns arise.She voices understanding.       Casimiro Lienhard G. [place], MD  [facility]. [place] office.

## 2015-08-19 NOTE — Progress Notes (Signed)
Pre visit review using our clinic review tool, if applicable. No additional management support is needed unless otherwise documented below in the visit note. 

## 2015-08-26 ENCOUNTER — Other Ambulatory Visit: Payer: Self-pay | Admitting: *Deleted

## 2015-08-26 DIAGNOSIS — R899 Unspecified abnormal finding in specimens from other organs, systems and tissues: Secondary | ICD-10-CM

## 2015-08-26 DIAGNOSIS — E559 Vitamin D deficiency, unspecified: Secondary | ICD-10-CM

## 2015-08-26 MED ORDER — VITAMIN D (ERGOCALCIFEROL) 1.25 MG (50000 UNIT) PO CAPS: 50000.0000 [IU] | ORAL_CAPSULE | ORAL | Status: DC

## 2015-11-05 ENCOUNTER — Other Ambulatory Visit: Payer: Self-pay

## 2015-11-19 ENCOUNTER — Other Ambulatory Visit (INDEPENDENT_AMBULATORY_CARE_PROVIDER_SITE_OTHER): Payer: BC Managed Care – PPO

## 2015-11-19 DIAGNOSIS — E559 Vitamin D deficiency, unspecified: Secondary | ICD-10-CM

## 2015-11-19 DIAGNOSIS — R899 Unspecified abnormal finding in specimens from other organs, systems and tissues: Secondary | ICD-10-CM

## 2015-11-19 LAB — BASIC METABOLIC PANEL
BUN: 20 mg/dL (ref 7–25)
CALCIUM: 9.3 mg/dL (ref 8.6–10.4)
CHLORIDE: 104 mmol/L (ref 98–110)
CO2: 26 mmol/L (ref 20–31)
Creat: 1.27 mg/dL — ABNORMAL HIGH (ref 0.50–0.99)
GLUCOSE: 81 mg/dL (ref 65–99)
Potassium: 4.4 mmol/L (ref 3.5–5.3)
SODIUM: 140 mmol/L (ref 135–146)

## 2015-11-20 LAB — VITAMIN D 25 HYDROXY (VIT D DEFICIENCY, FRACTURES): VIT D 25 HYDROXY: 44 ng/mL (ref 30–100)

## 2015-11-25 ENCOUNTER — Encounter: Payer: Self-pay | Admitting: Obstetrics & Gynecology

## 2015-11-25 DIAGNOSIS — E559 Vitamin D deficiency, unspecified: Secondary | ICD-10-CM | POA: Insufficient documentation

## 2015-11-25 DIAGNOSIS — R748 Abnormal levels of other serum enzymes: Secondary | ICD-10-CM | POA: Insufficient documentation

## 2015-11-26 ENCOUNTER — Telehealth: Payer: Self-pay | Admitting: *Deleted

## 2015-11-26 NOTE — Telephone Encounter (Signed)
Message left to return call to Wanda [name] at [phone].    

## 2015-11-26 NOTE — Telephone Encounter (Signed)
-----   Message from Jerene Bears, MD sent at 11/25/2015 10:42 AM EDT ----- Please let pt know her Vit D is improved to 44.  She's been taking 50k weekly.  She can transition to 2000 IU of Vit D (OTC is fine).  Her creatine is still mildly elevated.  I do want her to follow up with her PCP regarding this.  Does she feel comfortable calling her PCP or do we need to make an appt for her?  Thanks.

## 2015-12-08 NOTE — Telephone Encounter (Signed)
Patient notified of results. Patient states she has been busy and just hasn't had a chance to return the call. Patient states that she will call and make an appointment with her PCP, and if she has any problems scheduling she will call back and RN will help her get an appointment scheduled.

## 2016-01-18 ENCOUNTER — Ambulatory Visit (INDEPENDENT_AMBULATORY_CARE_PROVIDER_SITE_OTHER): Payer: BC Managed Care – PPO | Admitting: Cardiovascular Disease

## 2016-01-18 ENCOUNTER — Encounter: Payer: Self-pay | Admitting: Cardiovascular Disease

## 2016-01-18 VITALS — BP 150/50 | HR 74 | Ht 63.5 in | Wt 143.4 lb

## 2016-01-18 DIAGNOSIS — R011 Cardiac murmur, unspecified: Secondary | ICD-10-CM | POA: Diagnosis not present

## 2016-01-18 DIAGNOSIS — I499 Cardiac arrhythmia, unspecified: Secondary | ICD-10-CM

## 2016-01-18 DIAGNOSIS — I498 Other specified cardiac arrhythmias: Secondary | ICD-10-CM

## 2016-01-18 NOTE — Progress Notes (Signed)
Cardiology Office Note Date:  01/18/2016   ID:  Wanda [name], DOB Jan 02, 1951, MRN [id number]  PCP:  Betty [place]az[place]and, MD  Cardiologist:  Tonny Bollman, MD    Chief Complaint  Patient presents with  . Shortness of Breath     History of Present Illness: Wanda [name] is a 65 y.o. female who presents for evaluation of PVC's and an abnormal EKG. Also was noted to have a heart murmur on physical examination.  Patient is physically active. She participates in routine low-impact exercise. She has no exertional symptoms with aerobics. She does admit to exertional dyspnea with climbing stairs. This is not new for her. Feels palpitations when she is anxious, otherwise no complaints. Denies lightheadedness, dizziness, syncope.   There is no family history of premature CAD or CHF. Aunt with CHF - lived to be 96. Father and brother had 'blood clots in the legs.' Father died of AAA.   The patient has no personal history of cardiac disease. She has been healthy without any hospitalizations or major surgeries in the past.  Past Medical History:  Diagnosis Date  . Anxiety   . Migraine     Past Surgical History:  Procedure Laterality Date  . TONSILLECTOMY AND ADENOIDECTOMY      Current Outpatient Prescriptions  Medication Sig Dispense Refill  . amphetamine-dextroamphetamine (ADDERALL) 20 MG tablet Take 20 mg by mouth daily. Reported on 08/18/2015    . cyclobenzaprine (FLEXERIL) 10 MG tablet Take 1 tablet (10 mg total) by mouth 3 (three) times daily as needed for muscle spasms. 30 tablet 0  . estradiol (MINIVELLE) 0.05 MG/24HR patch Place 1 patch (0.05 mg total) onto the skin 2 (two) times a week. 8 patch 13  . EVEKEO 10 MG TABS     . ibuprofen (ADVIL,MOTRIN) 200 MG tablet Take 200 mg by mouth every 6 (six) hours as needed.    Marland Kitchen LORazepam (ATIVAN) 0.5 MG tablet Take 0.5 mg by mouth as needed.     . Multiple Vitamin (MULTIVITAMIN) tablet Take 1 tablet by mouth daily.    . progesterone (PROMETRIUM)  100 MG capsule TAKE 1 CAPSULE AT BEDTIME. 30 capsule 13  . Pseudoephedrine HCl (SUDAFED PO) Take by mouth as needed.    . rizatriptan (MAXALT-MLT) 10 MG disintegrating tablet TAKE 1 TABLET AT ONSET OF HEADACHE AND 1 TABLET 2 HOURS LATER IF NEEDED. 9 tablet 5  . Vitamin D, Ergocalciferol, (DRISDOL) 50000 units CAPS capsule Take 1 capsule (50,000 Units total) by mouth every 7 (seven) days. 12 capsule 0   No current facility-administered medications for this visit.     Allergies:   Codeine   Social History:  The patient  reports that she has quit smoking. She has never used smokeless tobacco. She reports that she drinks alcohol. She reports that she does not use drugs.   Family History:  The patient's  family history includes Breast cancer (age of onset: 45) in her sister; Congestive Heart Failure in her maternal grandfather; Deep vein thrombosis in her brother, father, paternal aunt, and paternal grandmother; Diabetes in her mother; Heart disease in her brother; Lung cancer in her brother; Ovarian cancer (age of onset: 19) in her maternal grandmother.   ROS:  Please see the history of present illness.  Otherwise, review of systems is positive for palpitations, shortness of breath, neck pain (bone spurs).  All other systems are reviewed and negative.   PHYSICAL EXAM: VS:  BP (!) 150/50 (BP Location: Right Arm, Cuff Size:  Normal)   Pulse 74   Ht 5' 3.5" (1.613 m)   Wt 65 kg (143 lb 6.4 oz)   LMP 08/12/2004   BMI 25.00 kg/m  , BMI Body mass index is 25 kg/m. GEN: Well nourished, well developed, in no acute distress  HEENT: normal  Neck: no JVD, no masses. Bilateral carotid bruits likely transmitted murmur Cardiac: Regular with frequent premature beats. There is a grade 3/6 harsh diastolic decrescendo murmur loudest at the right upper sternal border and a 2/6 early systolic murmur at the right upper sternal border      Respiratory:  clear to auscultation bilaterally, normal work of  breathing GI: soft, nontender, nondistended, + BS MS: no deformity or atrophy  Ext: no pretibial edema, pedal pulses 2+= bilaterally Skin: warm and dry, no rash Neuro:  Strength and sensation are intact Psych: euthymic mood, full affect  EKG:  EKG is ordered today. The ekg ordered today shows normal sinus rhythm with ventricular bigeminy, left anterior fascicular block, nonspecific ST abnormality  Recent Labs: 08/18/2015: ALT 12; Hemoglobin 13.6; Platelets 291; TSH 1.39 11/19/2015: BUN 20; Creat 1.27; Potassium 4.4; Sodium 140   Lipid Panel     Component Value Date/Time   CHOL 247 (H) 08/18/2015 1600   TRIG 73 08/18/2015 1600   HDL 108 08/18/2015 1600   CHOLHDL 2.3 08/18/2015 1600   VLDL 15 08/18/2015 1600   LDLCALC 124 08/18/2015 1600      Wt Readings from Last 3 Encounters:  01/18/16 65 kg (143 lb 6.4 oz)  08/19/15 64.2 kg (141 lb 9.6 oz)  08/18/15 63.5 kg (140 lb)     ASSESSMENT AND PLAN: 1.  Cardiac murmur suggestive of aortic insufficiency: Discussed plan to obtain echocardiogram to better evaluate the etiology of the patient's murmur. She understands we will be able to evaluate her heart valves as well as LV function. We discussed potential findings and treatment plans which could very is much as long-term observation versus need for cardiac surgery. Hopefully this will be something we can observe over time since she really seems to have minimal symptoms. We discussed potential etiologies of aortic insufficiency such as aortic dilatation, congenitally bicuspid aortic valve, and other possible valvular abnormalities such as prolapse. Will discuss further once her echocardiogram is obtained.  2. Ventricular bigeminy: Patient with minimal symptoms. We'll check a 24-hour Holter monitor. Will check an echo as above.  Current medicines are reviewed with the patient today.  The patient does not have concerns regarding medicines.  Labs/ tests ordered today include:   Orders Placed  This Encounter  Procedures  . Holter monitor - 24 hour  . EKG 12-Lead  . ECHOCARDIOGRAM COMPLETE   Disposition:   FU pending test results  Signed, Tonny Bollmanooper, Tymeir Weathington, MD  01/18/2016 6:17 PM    [facility] [address], [place], [place]  [address] Phone: [phone]; Fax: [phone]

## 2016-01-18 NOTE — Patient Instructions (Signed)
Medication Instructions:  Your physician recommends that you continue on your current medications as directed. Please refer to the Current Medication list given to you today.  Labwork: No new orders.   Testing/Procedures: Your physician has requested that you have an echocardiogram. Echocardiography is a painless test that uses sound waves to create images of your heart. It provides your doctor with information about the size and shape of your heart and how well your heart's chambers and valves are working. This procedure takes approximately one hour. There are no restrictions for this procedure.  Your physician has recommended that you wear a 24 hour holter monitor. Holter monitors are medical devices that record the heart's electrical activity. Doctors most often use these monitors to diagnose arrhythmias. Arrhythmias are problems with the speed or rhythm of the heartbeat. The monitor is a small, portable device. You can wear one while you do your normal daily activities. This is usually used to diagnose what is causing palpitations/syncope (passing out).  Follow-Up: We will arrange further follow-up after testing is complete.   Any Other Special Instructions Will Be Listed Below (If Applicable).     If you need a refill on your cardiac medications before your next appointment, please call your pharmacy.

## 2016-02-08 ENCOUNTER — Ambulatory Visit (INDEPENDENT_AMBULATORY_CARE_PROVIDER_SITE_OTHER): Payer: BC Managed Care – PPO

## 2016-02-08 ENCOUNTER — Ambulatory Visit (HOSPITAL_COMMUNITY): Payer: BC Managed Care – PPO | Attending: Cardiovascular Disease

## 2016-02-08 ENCOUNTER — Other Ambulatory Visit: Payer: Self-pay

## 2016-02-08 DIAGNOSIS — Z87891 Personal history of nicotine dependence: Secondary | ICD-10-CM | POA: Diagnosis not present

## 2016-02-08 DIAGNOSIS — I080 Rheumatic disorders of both mitral and aortic valves: Secondary | ICD-10-CM | POA: Insufficient documentation

## 2016-02-08 DIAGNOSIS — R011 Cardiac murmur, unspecified: Secondary | ICD-10-CM | POA: Insufficient documentation

## 2016-02-08 DIAGNOSIS — I499 Cardiac arrhythmia, unspecified: Secondary | ICD-10-CM | POA: Diagnosis not present

## 2016-02-08 DIAGNOSIS — I498 Other specified cardiac arrhythmias: Secondary | ICD-10-CM

## 2016-02-17 ENCOUNTER — Telehealth: Payer: Self-pay | Admitting: Cardiovascular Disease

## 2016-02-17 NOTE — Telephone Encounter (Signed)
Patient returned call.  Advised patient of Echo and Holter monitor results.  Offered her an appt with Dr Excell Seltzer on 02-19-16 at 10:30 per Lauren's note.  She accepted it and said that she would be here then.

## 2016-02-17 NOTE — Telephone Encounter (Signed)
Follow Up: ° ° °Returning Lauren's call from yesterday. °

## 2016-02-17 NOTE — Telephone Encounter (Signed)
Called, left msg to call back.

## 2016-02-18 ENCOUNTER — Encounter: Payer: Self-pay | Admitting: Cardiovascular Disease

## 2016-02-19 ENCOUNTER — Encounter: Payer: Self-pay | Admitting: Cardiovascular Disease

## 2016-02-19 ENCOUNTER — Ambulatory Visit (INDEPENDENT_AMBULATORY_CARE_PROVIDER_SITE_OTHER): Payer: BC Managed Care – PPO | Admitting: Cardiovascular Disease

## 2016-02-19 VITALS — BP 132/48 | HR 80 | Ht 64.0 in | Wt 140.8 lb

## 2016-02-19 DIAGNOSIS — I351 Nonrheumatic aortic (valve) insufficiency: Secondary | ICD-10-CM | POA: Diagnosis not present

## 2016-02-19 NOTE — Progress Notes (Signed)
Cardiology Office Note Date:  02/19/2016   ID:  RUTH KOVICH, DOB 1950-05-25, MRN [id number]  PCP:  Betty Swaziland, MD  Cardiologist:  Tonny Bollman, MD    Chief Complaint  Patient presents with  . Follow-up    ECHO and Monitor results     History of Present Illness: Wanda [name] is a 65 y.o. female who presents for follow-up evaluation. The patient was initially seen 01/18/2016 for evaluation of an abnormal EKG and newly diagnosed heart murmur. She was noted to have ventricular bigeminy on EKG. She had a heart murmur suggestive of aortic insufficiency and an echocardiogram was recommended. The echocardiogram demonstrated severe aortic insufficiency, dilated left ventricle, and mild to moderate LV systolic dysfunction with LVEF 40-45%. Holter monitor demonstrated frequent PVCs and periods of ventricular bigeminy.  The patient is here with her husband today. She reports no change in symptoms. To summarize, she has shortness of breath with climbing stairs. She is able to participating low level exercise without any exertional symptoms. She has not had heart palpitations, chest pain, chest pressure, lightheadedness, or syncope. She denies orthopnea, PND, or leg swelling.   Past Medical History:  Diagnosis Date  . Anxiety   . Migraine     Past Surgical History:  Procedure Laterality Date  . TONSILLECTOMY AND ADENOIDECTOMY      Current Outpatient Prescriptions  Medication Sig Dispense Refill  . amphetamine-dextroamphetamine (ADDERALL) 20 MG tablet Take 20 mg by mouth daily. Reported on 08/18/2015    . cyclobenzaprine (FLEXERIL) 10 MG tablet Take 1 tablet (10 mg total) by mouth 3 (three) times daily as needed for muscle spasms. 30 tablet 0  . estradiol (MINIVELLE) 0.05 MG/24HR patch Place 1 patch (0.05 mg total) onto the skin 2 (two) times a week. 8 patch 13  . EVEKEO 10 MG TABS Take 5 mg by mouth daily.     Marland Kitchen ibuprofen (ADVIL,MOTRIN) 200 MG tablet Take 200 mg by mouth every 6 (six)  hours as needed (pain).     . LORazepam (ATIVAN) 0.5 MG tablet Take 0.5 mg by mouth as directed.     . Multiple Vitamin (MULTIVITAMIN) tablet Take 1 tablet by mouth daily.    . progesterone (PROMETRIUM) 100 MG capsule Take 100 mg by mouth at bedtime.    . Pseudoephedrine HCl (SUDAFED PO) Take 1 tablet by mouth 2 (two) times daily as needed (congestion).     . rizatriptan (MAXALT-MLT) 10 MG disintegrating tablet Take 10 mg by mouth once as needed for migraine. May repeat in 2 hours if needed    . Vitamin D, Ergocalciferol, (DRISDOL) 50000 units CAPS capsule Take 1 capsule (50,000 Units total) by mouth every 7 (seven) days. 12 capsule 0   No current facility-administered medications for this visit.     Allergies:   Codeine   Social History:  The patient  reports that she has quit smoking. She has never used smokeless tobacco. She reports that she drinks alcohol. She reports that she does not use drugs.   Family History:  The patient's  family history includes Breast cancer (age of onset: 59) in her sister; Congestive Heart Failure in her maternal grandfather; Deep vein thrombosis in her brother, father, paternal aunt, and paternal grandmother; Diabetes in her mother; Heart disease in her brother; Lung cancer in her brother; Ovarian cancer (age of onset: 20) in her maternal grandmother.    ROS:  Please see the history of present illness.   All other systems are reviewed  and negative.    PHYSICAL EXAM: VS:  BP (!) 132/48   Pulse 80   Ht 5\' 4"  (1.626 m)   Wt 140 lb 12.8 oz (63.9 kg)   LMP 08/12/2004   BMI 24.17 kg/m  , BMI Body mass index is 24.17 kg/m. GEN: Well nourished, well developed, in no acute distress  Remainder of exam is deferred today as our time is spent in discussion  EKG:  EKG is not ordered today.  Recent Labs: 08/18/2015: ALT 12; Hemoglobin 13.6; Platelets 291; TSH 1.39 11/19/2015: BUN 20; Creat 1.27; Potassium 4.4; Sodium 140   Lipid Panel     Component Value  Date/Time   CHOL 247 (H) 08/18/2015 1600   TRIG 73 08/18/2015 1600   HDL 108 08/18/2015 1600   CHOLHDL 2.3 08/18/2015 1600   VLDL 15 08/18/2015 1600   LDLCALC 124 08/18/2015 1600      Wt Readings from Last 3 Encounters:  02/19/16 140 lb 12.8 oz (63.9 kg)  01/18/16 143 lb 6.4 oz (65 kg)  08/19/15 141 lb 9.6 oz (64.2 kg)     Cardiac Studies Reviewed: 2D Echo 02-08-2015: Left ventricle:  The cavity size was moderately dilated. Wall thickness was normal. Systolic function was mildly to moderately reduced. The estimated ejection fraction was in the range of 40% to 45%.  Mild diffuse hypokinesis with no identifiable regional variations. Features are consistent with a pseudonormal left ventricular filling pattern, with concomitant abnormal relaxation and increased filling pressure (grade 2 diastolic dysfunction).  ------------------------------------------------------------------- Aortic valve:  Poorly visualized.  Possibly bicuspid; mildly thickened, mildly calcified leaflets.  Doppler:   There was very mild stenosis.   There was severe regurgitation directed eccentrically in the LVOT and towards the mitral anterior leaflet.   VTI ratio of LVOT to aortic valve: 0.56. Valve area (VTI): 1.77 cm^2. Indexed valve area (VTI): 1.03 cm^2/m^2. Peak velocity ratio of LVOT to aortic valve: 0.54. Valve area (Vmax): 1.7 cm^2. Indexed valve area (Vmax): 0.99 cm^2/m^2. Mean velocity ratio of LVOT to aortic valve: 0.54. Valve area (Vmean): 1.71 cm^2. Indexed valve area (Vmean): 0.99 cm^2/m^2.    Mean gradient (S): 14 mm Hg. Peak gradient (S): 28 mm Hg.  ------------------------------------------------------------------- Aorta:  The aorta was without evidence of coarctation. Aortic root: The aortic root was normal in size. Ascending aorta: The ascending aorta was normal in size.  ------------------------------------------------------------------- Mitral valve:   Mildly thickened leaflets .   Doppler:  There was mild regurgitation.    Peak gradient (D): 8 mm Hg.  ------------------------------------------------------------------- Left atrium:  The atrium was mildly dilated.  ------------------------------------------------------------------- Atrial septum:  No defect or patent foramen ovale was identified.   ------------------------------------------------------------------- Right ventricle:  The cavity size was normal. Wall thickness was normal. Systolic function was normal.  ------------------------------------------------------------------- Pulmonic valve:    Structurally normal valve.   Cusp separation was normal.  Doppler:  Transvalvular velocity was within the normal range. There was no regurgitation.  ------------------------------------------------------------------- Tricuspid valve:   Structurally normal valve.   Leaflet separation was normal.  Doppler:  Transvalvular velocity was within the normal range. There was no regurgitation.  ------------------------------------------------------------------- Pulmonary artery:    Systolic pressure could not be accurately estimated.  ------------------------------------------------------------------- Right atrium:  The atrium was normal in size.  ------------------------------------------------------------------- Pericardium:  There was no pericardial effusion.  ------------------------------------------------------------------- Systemic veins: Inferior vena cava: The vessel was normal in size. The respirophasic diameter changes were in the normal range (>= 50%), consistent with normal central venous pressure.  ------------------------------------------------------------------- Post  procedure conclusions Ascending Aorta:  - The aorta was without evidence of coarctation.  ------------------------------------------------------------------- Measurements   Left ventricle                             Value          Reference  LV ID, ED, PLAX chordal           (H)     64.3  mm       43 - 52  LV ID, ES, PLAX chordal           (H)     50.1  mm       23 - 38  LV fx shortening, PLAX chordal    (L)     22    %        >=29  LV PW thickness, ED                       9.4   mm       ---------  IVS/LV PW ratio, ED                       0.71           <=1.3  Stroke volume, 2D                         105   ml       ---------  Stroke volume/bsa, 2D                     61    ml/m^2   ---------  LV end-diastolic volume, 1-p A4C          195   ml       ---------  LV end-systolic volume, 1-p A4C           113   ml       ---------  LV ejection fraction, 1-p A4C             42    %        ---------  Stroke volume, 1-p A4C                    82    ml       ---------  LV end-diastolic volume/bsa, 1-p          114   ml/m^2   ---------  A4C  LV end-systolic volume/bsa, 1-p           66    ml/m^2   ---------  A4C  Stroke volume/bsa, 1-p A4C                48    ml/m^2   ---------  LV e&', lateral                            6.09  cm/s     ---------  LV E/e&', lateral                          22.66          ---------  LV e&', medial  4.35  cm/s     ---------  LV E/e&', medial                           31.72          ---------  LV e&', average                            5.22  cm/s     ---------  LV E/e&', average                          26.44          ---------    Ventricular septum                        Value          Reference  IVS thickness, ED                         6.66  mm       ---------    LVOT                                      Value          Reference  LVOT ID, S                                20    mm       ---------  LVOT area                                 3.14  cm^2     ---------  LVOT peak velocity, S                     142   cm/s     ---------  LVOT mean velocity, S                     91.5  cm/s     ---------  LVOT VTI, S                               33.4   cm       ---------  LVOT peak gradient, S                     8     mm Hg    ---------    Aortic valve                              Value          Reference  Aortic valve peak velocity, S             263   cm/s     ---------  Aortic valve mean velocity, S             168   cm/s     ---------  Aortic valve VTI, S  59.2  cm       ---------  Aortic mean gradient, S                   14    mm Hg    ---------  Aortic peak gradient, S                   28    mm Hg    ---------  VTI ratio, LVOT/AV                        0.56           ---------  Aortic valve area, VTI                    1.77  cm^2     ---------  Aortic valve area/bsa, VTI                1.03  cm^2/m^2 ---------  Velocity ratio, peak, LVOT/AV             0.54           ---------  Aortic valve area, peak velocity          1.7   cm^2     ---------  Aortic valve area/bsa, peak               0.99  cm^2/m^2 ---------  velocity  Velocity ratio, mean, LVOT/AV             0.54           ---------  Aortic valve area, mean velocity          1.71  cm^2     ---------  Aortic valve area/bsa, mean               0.99  cm^2/m^2 ---------  velocity  Aortic regurg pressure half-time          234   ms       ---------    Aorta                                     Value          Reference  Aortic root ID, ED                        33    mm       ---------  Ascending aorta ID, A-P, S                32    mm       ---------    Left atrium                               Value          Reference  LA ID, A-P, ES                            35    mm       ---------  LA ID/bsa, A-P                            2.04  cm/m^2   <=  2.2  LA volume, S                              70.7  ml       ---------  LA volume/bsa, S                          41.1  ml/m^2   ---------  LA volume, ES, 1-p A4C                    52.6  ml       ---------  LA volume/bsa, ES, 1-p A4C                30.6  ml/m^2   ---------  LA volume, ES, 1-p A2C                     89.9  ml       ---------  LA volume/bsa, ES, 1-p A2C                52.3  ml/m^2   ---------    Mitral valve                              Value          Reference  Mitral E-wave peak velocity               138   cm/s     ---------  Mitral A-wave peak velocity               129   cm/s     ---------  Mitral deceleration time          (H)     232   ms       150 - 230  Mitral peak gradient, D                   8     mm Hg    ---------  Mitral E/A ratio, peak                    1.1            ---------    Systemic veins                            Value          Reference  Estimated CVP                             3     mm Hg    ---------    Right ventricle                           Value          Reference  TAPSE                                     24.8  mm       ---------  RV s&', lateral, S  11.4  cm/s     ---------  Holter monitor 02-08-2016: Study Highlights   The basic rhythm is sinus. There are frequent PVCs, periods of ventricular bigeminy, and occasional ventricular couplets and triplets.   ASSESSMENT AND PLAN: Severe, stage D aortic insufficiency: The patient has mild shortness of breath as her only symptom attributable to aortic insufficiency. However, this is new over the past year and I do suspect it is related to her valvular heart disease and cardiomyopathy. I have personally reviewed her echo images. She has severe aortic insufficiency with a dilated left ventricle and reduced LV systolic function. She and her husband are counseled at length today. They understand that even if she were completely asymptomatic, her echo findings are a class I indication for aortic valve replacement. We had a lengthy discussion regarding treatment options, further evaluation which will include cardiac catheterization and might include a CT scan of the chest. She will be referred to Dr. Tyrone Sage for consideration of aortic valve replacement. She would like to wait until after  the first of the year to do her heart catheterization. I have reviewed the risks, indications, and alternatives to cardiac catheterization with the patient. Risks include but are not limited to bleeding, infection, vascular injury, stroke, myocardial infection, arrhythmia, kidney injury, radiation-related injury in the case of prolonged fluoroscopy use, emergency cardiac surgery, and death. The patient understands the risks of serious complication is 1-2 in 1000 with diagnostic cardiac cath.  I advised her that she can continue with her low level exercise, but should not participating in any vigorous exercise at this point. Plan to initiate medical therapy for cardiomyopathy following surgery. However, in the setting of severe aortic insufficiency, I don't think it would be prudent to start her on a beta blocker which could potentially have consequences of prolonging her ventricular filling time, increasing stroke volume, and widening her pulse pressure further.  Current medicines are reviewed with the patient today.  The patient does not have concerns regarding medicines.  Labs/ tests ordered today include:  No orders of the defined types were placed in this encounter.  Disposition:   Referral to [facility] - Dr Tyrone Sage, schedule cardiac cath for preoperative assessment  Signed, Tonny Bollman, MD  02/19/2016 11:08 AM    [facility] [address] [place], [place], [place]  [address] Phone: [phone]; Fax: [phone]

## 2016-02-19 NOTE — Patient Instructions (Signed)
Medication Instructions:  Your physician recommends that you continue on your current medications as directed. Please refer to the Current Medication list given to you today.  Labwork: No new orders.   Testing/Procedures: Your physician has requested that you have a cardiac catheterization. Cardiac catheterization is used to diagnose and/or treat various heart conditions. Doctors may recommend this procedure for a number of different reasons. The most common reason is to evaluate chest pain. Chest pain can be a symptom of coronary artery disease (CAD), and cardiac catheterization can show whether plaque is narrowing or blocking your heart's arteries. This procedure is also used to evaluate the valves, as well as measure the blood flow and oxygen levels in different parts of your heart. For further information please visit [email or web address] Please follow instruction sheet, as given.  Follow-Up: You have been referred to Dr Sheliah Plane for discussion of Aortic Valve Replacement due to Aortic Insufficiency. Dr Dennie Maizes office will contact you with appointment.    Any Other Special Instructions Will Be Listed Below (If Applicable).     If you need a refill on your cardiac medications before your next appointment, please call your pharmacy.

## 2016-02-25 ENCOUNTER — Encounter: Payer: Self-pay | Admitting: Cardiothoracic Surgery

## 2016-02-25 ENCOUNTER — Institutional Professional Consult (permissible substitution) (INDEPENDENT_AMBULATORY_CARE_PROVIDER_SITE_OTHER): Payer: BC Managed Care – PPO | Admitting: Cardiothoracic Surgery

## 2016-02-25 ENCOUNTER — Other Ambulatory Visit: Payer: Self-pay | Admitting: *Deleted

## 2016-02-25 VITALS — BP 139/65 | HR 76 | Resp 16 | Ht 64.0 in | Wt 140.0 lb

## 2016-02-25 DIAGNOSIS — I350 Nonrheumatic aortic (valve) stenosis: Secondary | ICD-10-CM | POA: Diagnosis not present

## 2016-02-25 DIAGNOSIS — I351 Nonrheumatic aortic (valve) insufficiency: Secondary | ICD-10-CM

## 2016-02-25 NOTE — Patient Instructions (Signed)
Aortic Valve Replacement °Aortic valve replacement is surgical replacement of an aortic valve that cannot be repaired. This procedure may be done to replace: °· A narrow aortic valve (aortic valve stenosis). °· A deformed aortic valve (bicuspid aortic valve). °· An aortic valve that does not close all the way (aortic insufficiency). ° °The aortic valve is replaced with artificial (prosthetic) valve. Three types of prosthetic valves are available: °· Mechanical valves made entirely from man-made materials. °· Donor valves from human donors. These are only used in special situations. °· Biological valves made from animal tissues. ° °The type of prosthetic valve used will be determined based on various factors, including your age, your lifestyle, and other medical conditions you have. This procedure is done using an open surgical technique, meaning that a large incision will be made in your chest over your heart. °Tell a health care provider about: °· Any allergies you have. °· All medicines you are taking, including vitamins, herbs, eye drops, creams, and over-the-counter medicines. °· Any problems you or family members have had with anesthetic medicines. °· Any blood disorders you have. °· Any surgeries you have had. °· Any medical conditions you have. °· Whether you are pregnant or may be pregnant. °What are the risks? °Generally, this is a safe procedure. However, problems may occur, including: °· Infection. °· Bleeding. °· Allergic reactions to medicines. °· Damage to other structures or organs. °· Blood clotting caused by the prosthetic valve. °· Failure of the prosthetic valve. ° °What happens before the procedure? °· Ask your health care provider about: °? Changing or stopping your regular medicines. This is especially important if you are taking diabetes medicines or blood thinners. °? Taking medicines such as aspirin and ibuprofen. These medicines can thin your blood. Do not take these medicines before your  procedure if your health care provider instructs you not to. °· Follow instructions from your health care provider about eating or drinking restrictions. °· You may be given antibiotic medicine to help prevent infection. °· You may have tests, such as: °? Echocardiogram. °? Electrocardiogram (ECG). °? Blood tests. °? Urine tests. °· Plan to have someone take you home when you leave the hospital. °· Plan to have someone with you for at least 24 hours after you leave the hospital. °What happens during the procedure? °· To reduce your risk of infection: °? Your health care team will wash or sanitize their hands. °? Your skin will be washed with soap. °· An IV tube will be inserted into one of your veins. °· You will be given one or more of the following: °? A medicine to help you relax (sedative). °? A medicine to make you fall asleep (general anesthetic). °· You will be placed on a heart-lung bypass machine. This machine provides oxygen to your blood while your heart is undergoing surgery. °· An incision will be made in your chest, over your heart. °· Your damaged aortic valve will be removed. °· A prosthetic valve will be sewn into your heart. °· Your incision will be closed with stitches (sutures), skin glue, or adhesive tape. °· A bandage (dressing) will be placed over your incision. °The procedure may vary among health care providers and hospitals. °What happens after the procedure? °· Your blood pressure, heart rate, breathing rate, and blood oxygen level will be monitored often until the medicines you were given have worn off. °· Do not drive until your health care provider approves. °This information is not intended to replace advice   given to you by your health care provider. Make sure you discuss any questions you have with your health care provider. °Document Released: 07/20/2004 Document Revised: 08/06/2015 Document Reviewed: 02/01/2015 °[employer] Interactive Patient Education © 2017 [employer] ° °

## 2016-02-25 NOTE — Progress Notes (Signed)
[address]       [place] [id number]             [phone]                    [facility]anda [name] Medical Record #[id number] Date of Birth: 09/16/1950  Referring: Wanda [name], Michael, MD Primary Care: Wanda SwazilandJordan, MD  Chief Complaint:    Chief Complaint  Patient presents with  . Aortic Stenosis    Surgical eval, Cardiac Cath 02/19/16, ECHO 02/08/16    History of Present Illness:    Wanda [name] 65 y.o. female is seen in the office  today for Evaluation of aortic insufficiency. The patient was seen 01/18/2016 by cardiology because of  an abnormal EKG and newly diagnosed heart murmur. She was noted to have ventricular bigeminy on EKG. She had a heart murmur suggestive of aortic insufficiency and an echocardiogram was done. The echocardiogram demonstrated severe aortic insufficiency, dilated left ventricle, and mild to moderate LV systolic dysfunction with LVEF 40-45%. Holter monitor demonstrated frequent PVCs and periods of ventricular bigeminy. Heart catheterization has not been done  She has shortness of breath with climbing stairs. She is able to participating low level exercise without any exertional symptoms. She has not had heart palpitations, chest pain, chest pressure, lightheadedness, or syncope. She denies orthopnea, PND, or leg swelling.   Patient's family history is significant for her brother Wanda [name] date of birth 09/[birth year]1940 who had a history of pulmonary embolus and DVT in his late 4250s. In May 2004 he underwent aortic valve replacement with a 25 mechanical St. Jude valve and ligation of his left atrial appendage for a critically stenotic bicuspid aortic valve.  In 2080 presented with advanced stage lung cancer which he ultimately succumbed to. I do not have specific records concerning the brothers hypercoagulable state but he had been started on lifelong Coumadin by his hematologist prior to valve replacement.   Current Activity/ Functional  Status:  Patient is independent with mobility/ambulation, transfers, ADL's, IADL's.   Zubrod Score: At the time of surgery this patient's most appropriate activity status/level should be described as: []     0    Normal activity, no symptoms []     1    Restricted in physical strenuous activity but ambulatory, able to do out light work []     2    Ambulatory and capable of self care, unable to do work activities, up and about               >50 % of waking hours                              []     3    Only limited self care, in bed greater than 50% of waking hours []     4    Completely disabled, no self care, confined to bed or chair []     5    Moribund   Past Medical History:  Diagnosis Date  . Anxiety   . Migraine     Past Surgical History:  Procedure Laterality Date  . TONSILLECTOMY AND ADENOIDECTOMY      Family History  Problem Relation Age of Onset  . Diabetes Mother   . Ovarian cancer Maternal Grandmother 80  . Breast cancer Sister 2848  . Congestive Heart Failure Maternal Grandfather   . Heart disease  Brother     valve replacement  . Deep vein thrombosis Father   . Deep vein thrombosis Brother   . Deep vein thrombosis Paternal Aunt     x2  . Deep vein thrombosis Paternal Grandmother   . Lung cancer Brother     Social History   Social History  . Marital status: Married    Spouse name: N/A  . Number of children: N/A  . Years of education: N/A   Occupational History  . Not on file.   Social History Main Topics  . Smoking status: Former Games developer  . Smokeless tobacco: Never Used     Comment: in college  . Alcohol use Yes     Comment: 1-2 glasses of wine  . Drug use: No  . Sexual activity: Yes    Partners: Male    Birth control/ protection: Other-see comments, Post-menopausal     Comment: vasectomy   Other Topics Concern  . Not on file   Social History Narrative  . No narrative on file    History  Smoking Status  . Former Smoker  Smokeless Tobacco    . Never Used    Comment: in college    History  Alcohol Use  . Yes    Comment: 1-2 glasses of wine     Allergies  Allergen Reactions  . Codeine     nausea    Current Outpatient Prescriptions  Medication Sig Dispense Refill  . amphetamine-dextroamphetamine (ADDERALL) 20 MG tablet Take 20 mg by mouth daily. Reported on 08/18/2015    . cyclobenzaprine (FLEXERIL) 10 MG tablet Take 1 tablet (10 mg total) by mouth 3 (three) times daily as needed for muscle spasms. 30 tablet 0  . estradiol (MINIVELLE) 0.05 MG/24HR patch Place 1 patch (0.05 mg total) onto the skin 2 (two) times a week. 8 patch 13  . ibuprofen (ADVIL,MOTRIN) 200 MG tablet Take 200 mg by mouth every 6 (six) hours as needed (pain).     . LORazepam (ATIVAN) 0.5 MG tablet Take 0.5 mg by mouth as directed.     . Multiple Vitamin (MULTIVITAMIN) tablet Take 1 tablet by mouth daily.    . progesterone (PROMETRIUM) 100 MG capsule Take 100 mg by mouth at bedtime.    . Pseudoephedrine HCl (SUDAFED PO) Take 1 tablet by mouth 2 (two) times daily as needed (congestion).     . rizatriptan (MAXALT-MLT) 10 MG disintegrating tablet Take 10 mg by mouth once as needed for migraine. May repeat in 2 hours if needed    . Vitamin D, Ergocalciferol, (DRISDOL) 50000 units CAPS capsule Take 1 capsule (50,000 Units total) by mouth every 7 (seven) days. 12 capsule 0   No current facility-administered medications for this visit.       Review of Systems:     Cardiac Review of Systems: Y or N  Chest Pain [ n   ]  Resting SOB [n   ] Exertional SOB  Cove.Etienne  ]  Orthopnea [  n]   Pedal Edema [ n  ]    Palpitations Cove.Etienne  ] Syncope  [n  ]   Presyncope [ y  ]  General Review of Systems: [Y] = yes [  ]=no Constitional: recent weight change [ n ];  Wt loss over the last 3 months [   ] anorexia [  ]; fatigue [  ]; nausea [  ]; night sweats [  ]; fever [  ]; or chills [  ];  Dental: poor dentition[ n ]; Last Dentist visit: 3 months ago  Eye : blurred vision [   ]; diplopia [   ]; vision changes [  ];  Amaurosis fugax[  ]; Resp: cough [ n ];  wheezing[n  ];  hemoptysis[  ]; shortness of breath[  ]; paroxysmal nocturnal dyspnea[  ]; dyspnea on exertion[y  ]; or orthopnea[n  ];  GI:  gallstones[  ], vomiting[  ];  dysphagia[  ]; melena[  ];  hematochezia [  ]; heartburn[  ];   Hx of  Colonoscopy[  ]; GU: kidney stones [  ]; hematuria[  ];   dysuria [  ];  nocturia[  ];  history of     obstruction [  ]; urinary frequency [  ]             Skin: rash, swelling[  ];, hair loss[  ];  peripheral edema[  ];  or itching[  ]; Musculosketetal: myalgias[ n ];  joint swelling[  ];  joint erythema[n  ];  joint pain[  ];  back pain[  ];  Heme/Lymph: bruising[n  ];  bleeding[ n ];  anemia[n  ];  Neuro: TIA[ n ];  headaches[y  ];  stroke[ n ];  vertigo[ n ];  seizures[n  ];   paresthesias[  ];  difficulty walking[  n];  Psych:depression[  ]; anxiety[y  ];  Endocrine: diabetes[ n ];  thyroid dysfunction[ n ];  Immunizations: Flu up to date [  ]; Pneumococcal up to date [  ];  Other:  Physical Exam: BP 139/65 (BP Location: Right Arm, Patient Position: Sitting, Cuff Size: Normal)   Pulse 76   Resp 16   Ht 5\' 4"  (1.626 m)   Wt 140 lb (63.5 kg)   LMP 08/12/2004   SpO2 99% Comment: RA  BMI 24.03 kg/m   PHYSICAL EXAMINATION: General appearance: alert, cooperative, appears stated age, distracted and no distress Head: Normocephalic, without obvious abnormality, atraumatic Neck: no adenopathy, no carotid bruit, no JVD, supple, symmetrical, trachea midline and thyroid not enlarged, symmetric, no tenderness/mass/nodules Lymph nodes: Cervical, supraclavicular, and axillary nodes normal. Resp: clear to auscultation bilaterally Back: symmetric, no curvature. ROM normal. No CVA tenderness. Cardio: diastolic murmur: holodiastolic 4/6, blowing throughout the precordium GI: soft, non-tender; bowel sounds normal; no masses,  no organomegaly Extremities: extremities normal,  atraumatic, no cyanosis or edema and Homans sign is negative, no sign of DVT Neurologic: Grossly normal  Diagnostic Studies & Laboratory data:     Recent Radiology Findings:   No results found.   I have independently reviewed the above radiologic studies.  Recent Lab Findings: Lab Results  Component Value Date   WBC 7.1 08/18/2015   HGB 13.6 08/18/2015   HCT 40.4 08/18/2015   PLT 291 08/18/2015   GLUCOSE 81 11/19/2015   CHOL 247 (H) 08/18/2015   TRIG 73 08/18/2015   HDL 108 08/18/2015   LDLCALC 124 08/18/2015   ALT 12 08/18/2015   AST 16 08/18/2015   NA 140 11/19/2015   K 4.4 11/19/2015   CL 104 11/19/2015   CREATININE 1.27 (H) 11/19/2015   BUN 20 11/19/2015   CO2 26 11/19/2015   TSH 1.39 08/18/2015   NO Cardiac Cath:  ECHO: Result status: Final result                           *[facility]*  [address]                        [place], [place] [address]                            [phone]  ------------------------------------------------------------------- Transthoracic Echocardiography  Patient:    Wanda [name], Wanda [name] MR #:       [id number] Study Date: 02/08/2016 Gender:     F Age:        22 Height:     161.3 cm Weight:     65 kg BSA:        1.72 m^2 Pt. Status: Room:   ATTENDING    Wanda Bollman, MD  ORDERING     Wanda Bollman, MD  REFERRING    Wanda Bollman, MD  SONOGRAPHER  Dewitt Hoes, RDCS  PERFORMING   [facility], Outpatient  cc:  ------------------------------------------------------------------- LV EF: 40% -   45%  ------------------------------------------------------------------- Indications:      Murmur (R01.1).  ------------------------------------------------------------------- History:   Risk factors:  Former tobacco use.  ------------------------------------------------------------------- Study Conclusions  - Left ventricle: The cavity size was moderately dilated. Wall   thickness was  normal. Systolic function was mildly to moderately   reduced. The estimated ejection fraction was in the range of 40%   to 45%. Mild diffuse hypokinesis with no identifiable regional   variations. Features are consistent with a pseudonormal left   ventricular filling pattern, with concomitant abnormal relaxation   and increased filling pressure (grade 2 diastolic dysfunction). - Aortic valve: Possibly bicuspid; mildly thickened, mildly   calcified leaflets. There was very mild stenosis. There was   severe regurgitation directed eccentrically in the LVOT and   towards the mitral anterior leaflet. - Mitral valve: There was mild regurgitation. - Left atrium: The atrium was mildly dilated. - Atrial septum: No defect or patent foramen ovale was identified.  ------------------------------------------------------------------- Study data:  No prior study was available for comparison.  Study status:  Routine.  Procedure:  The patient reported no pain pre or post test. Transthoracic echocardiography. Image quality was adequate.  Study completion:  There were no complications. Transthoracic echocardiography.  M-mode, complete 2D, spectral Doppler, and color Doppler.  Birthdate:  Patient birthdate: 05-05-1950.  Age:  Patient is 65 yr old.  Sex:  Gender: female. BMI: 25 kg/m^2.  Blood pressure:     150/50  Patient status: Outpatient.  Study date:  Study date: 02/08/2016. Study time: 01:08 PM.  Location:  [facility]  -------------------------------------------------------------------  ------------------------------------------------------------------- Left ventricle:  The cavity size was moderately dilated. Wall thickness was normal. Systolic function was mildly to moderately reduced. The estimated ejection fraction was in the range of 40% to 45%.  Mild diffuse hypokinesis with no identifiable regional variations. Features are consistent with a pseudonormal left ventricular filling  pattern, with concomitant abnormal relaxation and increased filling pressure (grade 2 diastolic dysfunction).  ------------------------------------------------------------------- Aortic valve:  Poorly visualized.  Possibly bicuspid; mildly thickened, mildly calcified leaflets.  Doppler:   There was very mild stenosis.   There was severe regurgitation directed eccentrically in the LVOT and towards the mitral anterior leaflet.   VTI ratio of LVOT to aortic valve: 0.56. Valve area (VTI): 1.77 cm^2. Indexed valve area (VTI): 1.03 cm^2/m^2. Peak velocity ratio of LVOT to aortic valve: 0.54. Valve area (Vmax): 1.7 cm^2. Indexed valve area (Vmax): 0.99 cm^2/m^2. Mean velocity ratio of LVOT to aortic valve:  0.54. Valve area (Vmean): 1.71 cm^2. Indexed valve area (Vmean): 0.99 cm^2/m^2.    Mean gradient (S): 14 mm Hg. Peak gradient (S): 28 mm Hg.  ------------------------------------------------------------------- Aorta:  The aorta was without evidence of coarctation. Aortic root: The aortic root was normal in size. Ascending aorta: The ascending aorta was normal in size.  ------------------------------------------------------------------- Mitral valve:   Mildly thickened leaflets .  Doppler:  There was mild regurgitation.    Peak gradient (D): 8 mm Hg.  ------------------------------------------------------------------- Left atrium:  The atrium was mildly dilated.  ------------------------------------------------------------------- Atrial septum:  No defect or patent foramen ovale was identified.   ------------------------------------------------------------------- Right ventricle:  The cavity size was normal. Wall thickness was normal. Systolic function was normal.  ------------------------------------------------------------------- Pulmonic valve:    Structurally normal valve.   Cusp separation was normal.  Doppler:  Transvalvular velocity was within the normal range. There was  no regurgitation.  ------------------------------------------------------------------- Tricuspid valve:   Structurally normal valve.   Leaflet separation was normal.  Doppler:  Transvalvular velocity was within the normal range. There was no regurgitation.  ------------------------------------------------------------------- Pulmonary artery:    Systolic pressure could not be accurately estimated.  ------------------------------------------------------------------- Right atrium:  The atrium was normal in size.  ------------------------------------------------------------------- Pericardium:  There was no pericardial effusion.  ------------------------------------------------------------------- Systemic veins: Inferior vena cava: The vessel was normal in size. The respirophasic diameter changes were in the normal range (>= 50%), consistent with normal central venous pressure.  ------------------------------------------------------------------- Post procedure conclusions Ascending Aorta:  - The aorta was without evidence of coarctation.  ------------------------------------------------------------------- Measurements   Left ventricle                            Value          Reference  LV ID, ED, PLAX chordal           (H)     64.3  mm       43 - 52  LV ID, ES, PLAX chordal           (H)     50.1  mm       23 - 38  LV fx shortening, PLAX chordal    (L)     22    %        >=29  LV PW thickness, ED                       9.4   mm       ---------  IVS/LV PW ratio, ED                       0.71           <=1.3  Stroke volume, 2D                         105   ml       ---------  Stroke volume/bsa, 2D                     61    ml/m^2   ---------  LV end-diastolic volume, 1-p A4C          195   ml       ---------  LV end-systolic volume, 1-p A4C           113   ml       ---------  LV ejection fraction, 1-p A4C             42    %        ---------  Stroke volume, 1-p A4C                     82    ml       ---------  LV end-diastolic volume/bsa, 1-p          114   ml/m^2   ---------  A4C  LV end-systolic volume/bsa, 1-p           66    ml/m^2   ---------  A4C  Stroke volume/bsa, 1-p A4C                48    ml/m^2   ---------  LV e&', lateral                            6.09  cm/s     ---------  LV E/e&', lateral                          22.66          ---------  LV e&', medial                             4.35  cm/s     ---------  LV E/e&', medial                           31.72          ---------  LV e&', average                            5.22  cm/s     ---------  LV E/e&', average                          26.44          ---------    Ventricular septum                        Value          Reference  IVS thickness, ED                         6.66  mm       ---------    LVOT                                      Value          Reference  LVOT ID, S                                20    mm       ---------  LVOT area                                 3.14  cm^2     ---------  LVOT peak velocity, S                     142   cm/s     ---------  LVOT mean velocity, S                     91.5  cm/s     ---------  LVOT VTI, S                               33.4  cm       ---------  LVOT peak gradient, S                     8     mm Hg    ---------    Aortic valve                              Value          Reference  Aortic valve peak velocity, S             263   cm/s     ---------  Aortic valve mean velocity, S             168   cm/s     ---------  Aortic valve VTI, S                       59.2  cm       ---------  Aortic mean gradient, S                   14    mm Hg    ---------  Aortic peak gradient, S                   28    mm Hg    ---------  VTI ratio, LVOT/AV                        0.56           ---------  Aortic valve area, VTI                    1.77  cm^2     ---------  Aortic valve area/bsa, VTI                1.03  cm^2/m^2 ---------  Velocity ratio,  peak, LVOT/AV             0.54           ---------  Aortic valve area, peak velocity          1.7   cm^2     ---------  Aortic valve area/bsa, peak               0.99  cm^2/m^2 ---------  velocity  Velocity ratio, mean, LVOT/AV             0.54           ---------  Aortic valve area, mean velocity          1.71  cm^2     ---------  Aortic valve area/bsa, mean               0.99  cm^2/m^2 ---------  velocity  Aortic regurg pressure half-time          234   ms       ---------    Aorta                                     Value          Reference  Aortic root ID, ED                        33    mm       ---------  Ascending aorta ID, A-P, S                32    mm       ---------    Left atrium                               Value          Reference  LA ID, A-P, ES                            35    mm       ---------  LA ID/bsa, A-P                            2.04  cm/m^2   <=2.2  LA volume, S                              70.7  ml       ---------  LA volume/bsa, S                          41.1  ml/m^2   ---------  LA volume, ES, 1-p A4C                    52.6  ml       ---------  LA volume/bsa, ES, 1-p A4C                30.6  ml/m^2   ---------  LA volume, ES, 1-p A2C                    89.9  ml       ---------  LA volume/bsa, ES, 1-p A2C                52.3  ml/m^2   ---------    Mitral valve                              Value          Reference  Mitral E-wave peak velocity               138   cm/s     ---------  Mitral A-wave peak velocity               129   cm/s     ---------  Mitral deceleration time          (H)     232   ms  150 - 230  Mitral peak gradient, D                   8     mm Hg    ---------  Mitral E/A ratio, peak                    1.1            ---------    Systemic veins                            Value          Reference  Estimated CVP                             3     mm Hg    ---------    Right ventricle                           Value          Reference   TAPSE                                     24.8  mm       ---------  RV s&', lateral, S                         11.4  cm/s     ---------  Legend: (L)  and  (H)  mark values outside specified reference range.  ------------------------------------------------------------------- Prepared and Electronically Authenticated by  Thurmon Fair, MD 2017-11-27T16:15:17     Assessment / Plan:   Patient with symptomatic aortic insufficiency with a bicuspid aortic valve and depressed ejection fraction- I discussed with the patient proceeding in the relatively near future with aortic valve replacement. She has a cardiac catheterization scheduled for January 18. I discussed with her and her husband the risks and options of aortic valve replacement and discussed mechanical versus tissue valve replacement.  The patient has a family history in a brother of hypercoagulable state, her brother also had a bicuspid aortic valve and aortic valve replacement for severe aortic stenosis.  To evaluate the size of her aorta accurately prior to surgery a CTA of the chest will be plan in the near future, prior to her  cardiac catheterization    After her catheterization I will see her back in the office and again review with her the findings and planned aortic valve replacement.  I  spent 40 minutes counseling the patient face to face and 50% or more the  time was spent in counseling and coordination of care. The total time spent in the appointment was 60 minutes.  [name] MD      [address] [place] [place] [address] Office [phone]   Beeper [phone]  02/25/2016 10:47 AM

## 2016-03-01 ENCOUNTER — Other Ambulatory Visit: Payer: Self-pay | Admitting: Cardiovascular Disease

## 2016-03-01 DIAGNOSIS — I351 Nonrheumatic aortic (valve) insufficiency: Secondary | ICD-10-CM

## 2016-03-14 DIAGNOSIS — I509 Heart failure, unspecified: Secondary | ICD-10-CM

## 2016-03-14 DIAGNOSIS — R0600 Dyspnea, unspecified: Secondary | ICD-10-CM

## 2016-03-14 DIAGNOSIS — M1990 Unspecified osteoarthritis, unspecified site: Secondary | ICD-10-CM

## 2016-03-14 HISTORY — DX: Dyspnea, unspecified: R06.00

## 2016-03-14 HISTORY — DX: Unspecified osteoarthritis, unspecified site: M19.90

## 2016-03-14 HISTORY — DX: Heart failure, unspecified: I50.9

## 2016-03-23 ENCOUNTER — Other Ambulatory Visit: Payer: BC Managed Care – PPO | Admitting: *Deleted

## 2016-03-23 DIAGNOSIS — I351 Nonrheumatic aortic (valve) insufficiency: Secondary | ICD-10-CM

## 2016-03-23 NOTE — Addendum Note (Signed)
Addended by: Tonita Phoenix on: 03/23/2016 12:30 PM   Modules accepted: Orders

## 2016-03-24 ENCOUNTER — Other Ambulatory Visit: Payer: BC Managed Care – PPO

## 2016-03-24 ENCOUNTER — Ambulatory Visit
Admission: RE | Admit: 2016-03-24 | Discharge: 2016-03-24 | Disposition: A | Discharge status: Home / Self Care | Payer: BC Managed Care – PPO | Source: Ambulatory Visit | Attending: Cardiothoracic Surgery | Admitting: Cardiothoracic Surgery

## 2016-03-24 DIAGNOSIS — I350 Nonrheumatic aortic (valve) stenosis: Secondary | ICD-10-CM

## 2016-03-24 DIAGNOSIS — I351 Nonrheumatic aortic (valve) insufficiency: Secondary | ICD-10-CM

## 2016-03-24 LAB — CBC WITH DIFFERENTIAL/PLATELET
BASOS ABS: 0.1 10*3/uL (ref 0.0–0.2)
Basos: 2 %
EOS (ABSOLUTE): 0.2 10*3/uL (ref 0.0–0.4)
Eos: 4 %
HEMOGLOBIN: 12.4 g/dL (ref 11.1–15.9)
Hematocrit: 37.1 % (ref 34.0–46.6)
IMMATURE GRANULOCYTES: 0 %
Immature Grans (Abs): 0 10*3/uL (ref 0.0–0.1)
Lymphocytes Absolute: 1.3 10*3/uL (ref 0.7–3.1)
Lymphs: 34 %
MCH: 30.6 pg (ref 26.6–33.0)
MCHC: 33.4 g/dL (ref 31.5–35.7)
MCV: 92 fL (ref 79–97)
MONOCYTES: 11 %
Monocytes Absolute: 0.4 10*3/uL (ref 0.1–0.9)
NEUTROS ABS: 1.9 10*3/uL (ref 1.4–7.0)
Neutrophils: 49 %
PLATELETS: 256 10*3/uL (ref 150–379)
RBC: 4.05 x10E6/uL (ref 3.77–5.28)
RDW: 14.1 % (ref 12.3–15.4)
WBC: 3.9 10*3/uL (ref 3.4–10.8)

## 2016-03-24 LAB — BASIC METABOLIC PANEL
BUN/Creatinine Ratio: 16 (ref 12–28)
BUN: 19 mg/dL (ref 8–27)
CALCIUM: 9 mg/dL (ref 8.7–10.3)
CHLORIDE: 102 mmol/L (ref 96–106)
CO2: 23 mmol/L (ref 18–29)
Creatinine, Ser: 1.17 mg/dL — ABNORMAL HIGH (ref 0.57–1.00)
GFR calc non Af Amer: 50 mL/min/{1.73_m2} — ABNORMAL LOW (ref >59)
GFR, EST AFRICAN AMERICAN: 57 mL/min/{1.73_m2} — AB (ref >59)
Glucose: 93 mg/dL (ref 65–99)
Potassium: 4.9 mmol/L (ref 3.5–5.2)
Sodium: 142 mmol/L (ref 134–144)

## 2016-03-24 LAB — PROTIME-INR
INR: 1 (ref 0.8–1.2)
Prothrombin Time: 10.2 s (ref 9.1–12.0)

## 2016-03-24 MED ORDER — IOPAMIDOL (ISOVUE-370) INJECTION 76%
75.0000 mL | Freq: Once | INTRAVENOUS | Status: AC | PRN
  Administered 2016-03-24: 75 mL via INTRAVENOUS

## 2016-04-04 ENCOUNTER — Other Ambulatory Visit: Payer: Self-pay | Admitting: Cardiovascular Disease

## 2016-04-05 ENCOUNTER — Ambulatory Visit (HOSPITAL_COMMUNITY)
Admission: RE | Admit: 2016-04-05 | Discharge: 2016-04-05 | Disposition: A | Discharge status: Home / Self Care | Payer: BC Managed Care – PPO | Source: Ambulatory Visit | Attending: Cardiovascular Disease | Admitting: Cardiovascular Disease

## 2016-04-05 ENCOUNTER — Encounter (HOSPITAL_COMMUNITY)
Admission: RE | Disposition: A | Discharge status: Home / Self Care | Payer: Self-pay | Source: Ambulatory Visit | Attending: Cardiovascular Disease

## 2016-04-05 DIAGNOSIS — I2510 Atherosclerotic heart disease of native coronary artery without angina pectoris: Secondary | ICD-10-CM | POA: Insufficient documentation

## 2016-04-05 DIAGNOSIS — Z87891 Personal history of nicotine dependence: Secondary | ICD-10-CM | POA: Insufficient documentation

## 2016-04-05 DIAGNOSIS — I351 Nonrheumatic aortic (valve) insufficiency: Secondary | ICD-10-CM | POA: Diagnosis not present

## 2016-04-05 DIAGNOSIS — F419 Anxiety disorder, unspecified: Secondary | ICD-10-CM | POA: Insufficient documentation

## 2016-04-05 DIAGNOSIS — Z833 Family history of diabetes mellitus: Secondary | ICD-10-CM | POA: Insufficient documentation

## 2016-04-05 DIAGNOSIS — I493 Ventricular premature depolarization: Secondary | ICD-10-CM | POA: Diagnosis not present

## 2016-04-05 DIAGNOSIS — Z8249 Family history of ischemic heart disease and other diseases of the circulatory system: Secondary | ICD-10-CM | POA: Diagnosis not present

## 2016-04-05 DIAGNOSIS — Z801 Family history of malignant neoplasm of trachea, bronchus and lung: Secondary | ICD-10-CM | POA: Diagnosis not present

## 2016-04-05 DIAGNOSIS — G43909 Migraine, unspecified, not intractable, without status migrainosus: Secondary | ICD-10-CM | POA: Diagnosis not present

## 2016-04-05 DIAGNOSIS — I2584 Coronary atherosclerosis due to calcified coronary lesion: Secondary | ICD-10-CM | POA: Insufficient documentation

## 2016-04-05 DIAGNOSIS — Z803 Family history of malignant neoplasm of breast: Secondary | ICD-10-CM | POA: Diagnosis not present

## 2016-04-05 DIAGNOSIS — Z8041 Family history of malignant neoplasm of ovary: Secondary | ICD-10-CM | POA: Diagnosis not present

## 2016-04-05 LAB — POCT I-STAT 3, VENOUS BLOOD GAS (G3P V)
ACID-BASE DEFICIT: 3 mmol/L — AB (ref 0.0–2.0)
ACID-BASE DEFICIT: 3 mmol/L — AB (ref 0.0–2.0)
BICARBONATE: 23.2 mmol/L (ref 20.0–28.0)
BICARBONATE: 22.9 mmol/L (ref 20.0–28.0)
O2 SAT: 73 %
O2 Saturation: 78 %
PCO2 VEN: 43.6 mmHg — AB (ref 44.0–60.0)
PO2 VEN: 41 mmHg (ref 32.0–45.0)
TCO2: 25 mmol/L (ref 0–100)
TCO2: 24 mmol/L (ref 0–100)
pCO2, Ven: 43.8 mmHg — ABNORMAL LOW (ref 44.0–60.0)
pH, Ven: 7.333 (ref 7.250–7.430)
pH, Ven: 7.329 (ref 7.250–7.430)
pO2, Ven: 46 mmHg — ABNORMAL HIGH (ref 32.0–45.0)

## 2016-04-05 LAB — POCT I-STAT 3, ART BLOOD GAS (G3+)
Acid-base deficit: 2 mmol/L (ref 0.0–2.0)
Bicarbonate: 23 mmol/L (ref 20.0–28.0)
O2 Saturation: 99 %
TCO2: 24 mmol/L (ref 0–100)
pCO2 arterial: 40.3 mmHg (ref 32.0–48.0)
pH, Arterial: 7.364 (ref 7.350–7.450)
pO2, Arterial: 144 mmHg — ABNORMAL HIGH (ref 83.0–108.0)

## 2016-04-05 SURGERY — RIGHT HEART CATH AND CORONARY ANGIOGRAPHY
Anesthesia: LOCAL

## 2016-04-05 MED ORDER — ONDANSETRON HCL 4 MG/2ML IJ SOLN: 4.0000 mg | Freq: Four times a day (QID) | INTRAMUSCULAR | Status: DC | PRN

## 2016-04-05 MED ORDER — SODIUM CHLORIDE 0.9 % IV SOLN: INTRAVENOUS | Status: DC

## 2016-04-05 MED ORDER — SODIUM CHLORIDE 0.9% FLUSH: 3.0000 mL | INTRAVENOUS | Status: DC | PRN

## 2016-04-05 MED ORDER — IOPAMIDOL (ISOVUE-370) INJECTION 76%
INTRAVENOUS | Status: DC | PRN
  Administered 2016-04-05: 60 mL via INTRA_ARTERIAL

## 2016-04-05 MED ORDER — HEPARIN SODIUM (PORCINE) 1000 UNIT/ML IJ SOLN
INTRAMUSCULAR | Status: AC
  Filled 2016-04-05: qty 1

## 2016-04-05 MED ORDER — MIDAZOLAM HCL 2 MG/2ML IJ SOLN
INTRAMUSCULAR | Status: DC | PRN
  Administered 2016-04-05: 2 mg via INTRAVENOUS
  Administered 2016-04-05: 1 mg via INTRAVENOUS

## 2016-04-05 MED ORDER — IOPAMIDOL (ISOVUE-370) INJECTION 76%
INTRAVENOUS | Status: AC
  Filled 2016-04-05: qty 100

## 2016-04-05 MED ORDER — HEPARIN (PORCINE) IN NACL 2-0.9 UNIT/ML-% IJ SOLN
INTRAMUSCULAR | Status: DC | PRN
  Administered 2016-04-05: 1000 mL

## 2016-04-05 MED ORDER — HEPARIN (PORCINE) IN NACL 2-0.9 UNIT/ML-% IJ SOLN
INTRAMUSCULAR | Status: AC
  Filled 2016-04-05: qty 1000

## 2016-04-05 MED ORDER — FENTANYL CITRATE (PF) 100 MCG/2ML IJ SOLN
INTRAMUSCULAR | Status: AC
  Filled 2016-04-05: qty 2

## 2016-04-05 MED ORDER — SODIUM CHLORIDE 0.9% FLUSH: 3.0000 mL | Freq: Two times a day (BID) | INTRAVENOUS | Status: DC

## 2016-04-05 MED ORDER — HEPARIN (PORCINE) IN NACL 2-0.9 UNIT/ML-% IJ SOLN
INTRAMUSCULAR | Status: DC | PRN
  Administered 2016-04-05: 10 mL via INTRA_ARTERIAL

## 2016-04-05 MED ORDER — SODIUM CHLORIDE 0.9 % WEIGHT BASED INFUSION
3.0000 mL/kg/h | INTRAVENOUS | Status: AC
  Administered 2016-04-05: 3 mL/kg/h via INTRAVENOUS

## 2016-04-05 MED ORDER — LIDOCAINE HCL (PF) 1 % IJ SOLN
INTRAMUSCULAR | Status: AC
  Filled 2016-04-05: qty 30

## 2016-04-05 MED ORDER — ACETAMINOPHEN 325 MG PO TABS: 650.0000 mg | ORAL_TABLET | ORAL | Status: DC | PRN

## 2016-04-05 MED ORDER — SODIUM CHLORIDE 0.9 % IV SOLN: 250.0000 mL | INTRAVENOUS | Status: DC | PRN

## 2016-04-05 MED ORDER — MIDAZOLAM HCL 2 MG/2ML IJ SOLN
INTRAMUSCULAR | Status: AC
  Filled 2016-04-05: qty 2

## 2016-04-05 MED ORDER — VERAPAMIL HCL 2.5 MG/ML IV SOLN
INTRAVENOUS | Status: AC
  Filled 2016-04-05: qty 2

## 2016-04-05 MED ORDER — FENTANYL CITRATE (PF) 100 MCG/2ML IJ SOLN
INTRAMUSCULAR | Status: DC | PRN
  Administered 2016-04-05 (×2): 25 ug via INTRAVENOUS

## 2016-04-05 MED ORDER — HEPARIN SODIUM (PORCINE) 1000 UNIT/ML IJ SOLN
INTRAMUSCULAR | Status: DC | PRN
  Administered 2016-04-05: 4000 [IU] via INTRAVENOUS

## 2016-04-05 MED ORDER — SODIUM CHLORIDE 0.9 % WEIGHT BASED INFUSION: 1.0000 mL/kg/h | INTRAVENOUS | Status: DC

## 2016-04-05 MED ORDER — LIDOCAINE HCL (PF) 1 % IJ SOLN
INTRAMUSCULAR | Status: DC | PRN
  Administered 2016-04-05 (×2): 2 mL

## 2016-04-05 SURGICAL SUPPLY — 12 items
CATH 5FR JL3.5 JR4 ANG PIG MP (CATHETERS) ×2 IMPLANT
CATH BALLN WEDGE 5F 110CM (CATHETERS) ×1 IMPLANT
DEVICE RAD COMP TR BAND LRG (VASCULAR PRODUCTS) ×1 IMPLANT
GLIDESHEATH SLEND SS 6F .021 (SHEATH) ×1 IMPLANT
GUIDEWIRE INQWIRE 1.5J.035X260 (WIRE) ×1 IMPLANT
INQWIRE 1.5J .035X260CM (WIRE) ×2
KIT HEART LEFT (KITS) ×2 IMPLANT
PACK CARDIAC CATHETERIZATION (CUSTOM PROCEDURE TRAY) ×2 IMPLANT
SHEATH FAST CATH BRACH 5F 5CM (SHEATH) ×1 IMPLANT
SYR MEDRAD MARK V 150ML (SYRINGE) ×2 IMPLANT
TRANSDUCER W/STOPCOCK (MISCELLANEOUS) ×2 IMPLANT
TUBING CIL FLEX 10 FLL-RA (TUBING) ×2 IMPLANT

## 2016-04-05 NOTE — Progress Notes (Signed)
Dr Excell Seltzer in and checked right arm and no new orders

## 2016-04-05 NOTE — H&P (Signed)
Cardiology Office Note Date:  02/19/2016   ID:  Wanda [name], DOB 10-Jun-1950, MRN [id number]  PCP:  Betty Swaziland, MD          Cardiologist:  Tonny Bollman, MD                   Chief Complaint  Patient presents with  . Follow-up    ECHO and Monitor results     History of Present Illness: Wanda [name] is a 66 y.o. female who presents for follow-up evaluation. The patient was initially seen 01/18/2016 for evaluation of an abnormal EKG and newly diagnosed heart murmur. She was noted to have ventricular bigeminy on EKG. She had a heart murmur suggestive of aortic insufficiency and an echocardiogram was recommended. The echocardiogram demonstrated severe aortic insufficiency, dilated left ventricle, and mild to moderate LV systolic dysfunction with LVEF 40-45%. Holter monitor demonstrated frequent PVCs and periods of ventricular bigeminy.  The patient is here with her husband today. She reports no change in symptoms. To summarize, she has shortness of breath with climbing stairs. She is able to participating low level exercise without any exertional symptoms. She has not had heart palpitations, chest pain, chest pressure, lightheadedness, or syncope. She denies orthopnea, PND, or leg swelling.       Past Medical History:  Diagnosis Date  . Anxiety   . Migraine          Past Surgical History:  Procedure Laterality Date  . TONSILLECTOMY AND ADENOIDECTOMY            Current Outpatient Prescriptions  Medication Sig Dispense Refill  . amphetamine-dextroamphetamine (ADDERALL) 20 MG tablet Take 20 mg by mouth daily. Reported on 08/18/2015    . cyclobenzaprine (FLEXERIL) 10 MG tablet Take 1 tablet (10 mg total) by mouth 3 (three) times daily as needed for muscle spasms. 30 tablet 0  . estradiol (MINIVELLE) 0.05 MG/24HR patch Place 1 patch (0.05 mg total) onto the skin 2 (two) times a week. 8 patch 13  . EVEKEO 10 MG TABS Take 5 mg by mouth daily.     Marland Kitchen ibuprofen  (ADVIL,MOTRIN) 200 MG tablet Take 200 mg by mouth every 6 (six) hours as needed (pain).     . LORazepam (ATIVAN) 0.5 MG tablet Take 0.5 mg by mouth as directed.     . Multiple Vitamin (MULTIVITAMIN) tablet Take 1 tablet by mouth daily.    . progesterone (PROMETRIUM) 100 MG capsule Take 100 mg by mouth at bedtime.    . Pseudoephedrine HCl (SUDAFED PO) Take 1 tablet by mouth 2 (two) times daily as needed (congestion).     . rizatriptan (MAXALT-MLT) 10 MG disintegrating tablet Take 10 mg by mouth once as needed for migraine. May repeat in 2 hours if needed    . Vitamin D, Ergocalciferol, (DRISDOL) 50000 units CAPS capsule Take 1 capsule (50,000 Units total) by mouth every 7 (seven) days. 12 capsule 0   No current facility-administered medications for this visit.     Allergies:   Codeine   Social History:  The patient  reports that she has quit smoking. She has never used smokeless tobacco. She reports that she drinks alcohol. She reports that she does not use drugs.   Family History:  The patient's  family history includes Breast cancer (age of onset: 43) in her sister; Congestive Heart Failure in her maternal grandfather; Deep vein thrombosis in her brother, father, paternal aunt, and paternal grandmother; Diabetes in her mother; Heart disease  in her brother; Lung cancer in her brother; Ovarian cancer (age of onset: 19) in her maternal grandmother.    ROS:  Please see the history of present illness.   All other systems are reviewed and negative.    PHYSICAL EXAM: VS:  BP (!) 132/48   Pulse 80   Ht 5\' 4"  (1.626 m)   Wt 140 lb 12.8 oz (63.9 kg)   LMP 08/12/2004   BMI 24.17 kg/m  , BMI Body mass index is 24.17 kg/m. GEN: Well nourished, well developed, in no acute distress  Remainder of exam is deferred today as our time is spent in discussion  EKG:  EKG is not ordered today.  Recent Labs: 08/18/2015: ALT 12; Hemoglobin 13.6; Platelets 291; TSH 1.39 11/19/2015: BUN  20; Creat 1.27; Potassium 4.4; Sodium 140   Lipid Panel  Labs (Brief)          Component Value Date/Time   CHOL 247 (H) 08/18/2015 1600   TRIG 73 08/18/2015 1600   HDL 108 08/18/2015 1600   CHOLHDL 2.3 08/18/2015 1600   VLDL 15 08/18/2015 1600   LDLCALC 124 08/18/2015 1600           Wt Readings from Last 3 Encounters:  02/19/16 140 lb 12.8 oz (63.9 kg)  01/18/16 143 lb 6.4 oz (65 kg)  08/19/15 141 lb 9.6 oz (64.2 kg)     Cardiac Studies Reviewed: 2D Echo 02-08-2015: Left ventricle: The cavity size was moderately dilated. Wall thickness was normal. Systolic function was mildly to moderately reduced. The estimated ejection fraction was in the range of 40% to 45%. Mild diffuse hypokinesis with no identifiable regional variations. Features are consistent with a pseudonormal left ventricular filling pattern, with concomitant abnormal relaxation and increased filling pressure (grade 2 diastolic dysfunction).  ------------------------------------------------------------------- Aortic valve: Poorly visualized. Possibly bicuspid; mildly thickened, mildly calcified leaflets. Doppler: There was very mild stenosis. There was severe regurgitation directed eccentrically in the LVOT and towards the mitral anterior leaflet. VTI ratio of LVOT to aortic valve: 0.56. Valve area (VTI): 1.77 cm^2. Indexed valve area (VTI): 1.03 cm^2/m^2. Peak velocity ratio of LVOT to aortic valve: 0.54. Valve area (Vmax): 1.7 cm^2. Indexed valve area (Vmax): 0.99 cm^2/m^2. Mean velocity ratio of LVOT to aortic valve: 0.54. Valve area (Vmean): 1.71 cm^2. Indexed valve area (Vmean): 0.99 cm^2/m^2. Mean gradient (S): 14 mm Hg. Peak gradient (S): 28 mm Hg.  ------------------------------------------------------------------- Aorta: The aorta was without evidence of coarctation. Aortic root: The aortic root was normal in size. Ascending aorta: The ascending aorta was normal in  size.  ------------------------------------------------------------------- Mitral valve: Mildly thickened leaflets . Doppler: There was mild regurgitation. Peak gradient (D): 8 mm Hg.  ------------------------------------------------------------------- Left atrium: The atrium was mildly dilated.  ------------------------------------------------------------------- Atrial septum: No defect or patent foramen ovale was identified.  ------------------------------------------------------------------- Right ventricle: The cavity size was normal. Wall thickness was normal. Systolic function was normal.  ------------------------------------------------------------------- Pulmonic valve: Structurally normal valve. Cusp separation was normal. Doppler: Transvalvular velocity was within the normal range. There was no regurgitation.  ------------------------------------------------------------------- Tricuspid valve: Structurally normal valve. Leaflet separation was normal. Doppler: Transvalvular velocity was within the normal range. There was no regurgitation.  ------------------------------------------------------------------- Pulmonary artery: Systolic pressure could not be accurately estimated.  ------------------------------------------------------------------- Right atrium: The atrium was normal in size.  ------------------------------------------------------------------- Pericardium: There was no pericardial effusion.  ------------------------------------------------------------------- Systemic veins: Inferior vena cava: The vessel was normal in size. The respirophasic diameter changes were in the normal range (>= 50%), consistent with normal  central venous pressure.  ------------------------------------------------------------------- Post procedure conclusions Ascending Aorta:  - The aorta was without evidence of  coarctation.  ------------------------------------------------------------------- Measurements  Left ventricle Value Reference LV ID, ED, PLAX chordal (H) 64.3 mm 43 - 52 LV ID, ES, PLAX chordal (H) 50.1 mm 23 - 38 LV fx shortening, PLAX chordal (L) 22 % >=29 LV PW thickness, ED 9.4 mm --------- IVS/LV PW ratio, ED 0.71 <=1.3 Stroke volume, 2D 105 ml --------- Stroke volume/bsa, 2D 61 ml/m^2 --------- LV end-diastolic volume, 1-p A4C 195 ml --------- LV end-systolic volume, 1-p A4C 113 ml --------- LV ejection fraction, 1-p A4C 42 % --------- Stroke volume, 1-p A4C 82 ml --------- LV end-diastolic volume/bsa, 1-p 114 ml/m^2 --------- A4C LV end-systolic volume/bsa, 1-p 66 ml/m^2 --------- A4C Stroke volume/bsa, 1-p A4C 48 ml/m^2 --------- LV e&', lateral 6.09 cm/s --------- LV E/e&', lateral 22.66 --------- LV e&', medial 4.35 cm/s --------- LV E/e&', medial 31.72 --------- LV e&', average 5.22 cm/s --------- LV E/e&', average 26.44 ---------  Ventricular septum Value Reference IVS thickness, ED 6.66 mm ---------  LVOT Value Reference LVOT ID, S 20 mm --------- LVOT area 3.14 cm^2 --------- LVOT peak velocity, S  142 cm/s --------- LVOT mean velocity, S 91.5 cm/s --------- LVOT VTI, S 33.4 cm --------- LVOT peak gradient, S 8 mm Hg ---------  Aortic valve Value Reference Aortic valve peak velocity, S 263 cm/s --------- Aortic valve mean velocity, S 168 cm/s --------- Aortic valve VTI, S 59.2 cm --------- Aortic mean gradient, S 14 mm Hg --------- Aortic peak gradient, S 28 mm Hg --------- VTI ratio, LVOT/AV 0.56 --------- Aortic valve area, VTI 1.77 cm^2 --------- Aortic valve area/bsa, VTI 1.03 cm^2/m^2 --------- Velocity ratio, peak, LVOT/AV 0.54 --------- Aortic valve area, peak velocity 1.7 cm^2 --------- Aortic valve area/bsa, peak 0.99 cm^2/m^2 --------- velocity Velocity ratio, mean, LVOT/AV 0.54 --------- Aortic valve area, mean velocity 1.71 cm^2 --------- Aortic valve area/bsa, mean 0.99 cm^2/m^2 --------- velocity Aortic regurg pressure half-time 234 ms ---------  Aorta Value Reference Aortic root ID, ED 33 mm --------- Ascending aorta ID, A-P, S 32 mm ---------  Left atrium Value Reference LA ID, A-P, ES 35 mm --------- LA ID/bsa, A-P 2.04 cm/m^2 <=2.2 LA volume, S 70.7 ml --------- LA volume/bsa, S 41.1 ml/m^2 --------- LA volume,  ES, 1-p A4C 52.6 ml --------- LA volume/bsa, ES, 1-p A4C 30.6 ml/m^2 --------- LA volume, ES, 1-p A2C 89.9 ml --------- LA volume/bsa, ES, 1-p A2C 52.3 ml/m^2 ---------  Mitral valve Value Reference Mitral E-wave peak velocity 138 cm/s --------- Mitral A-wave peak velocity 129 cm/s --------- Mitral deceleration time (H) 232 ms 150 - 230 Mitral peak gradient, D 8 mm Hg --------- Mitral E/A ratio, peak 1.1 ---------  Systemic veins Value Reference Estimated CVP 3 mm Hg ---------  Right ventricle Value Reference TAPSE 24.8 mm --------- RV s&', lateral, S 11.4 cm/s ---------  Holter monitor 02-08-2016: Study Highlights   The basic rhythm is sinus. There are frequent PVCs, periods of ventricular bigeminy, and occasional ventricular couplets and triplets.   ASSESSMENT AND PLAN: Severe, stage D aortic insufficiency: The patient has mild shortness of breath as her only symptom attributable to aortic insufficiency. However, this is new over the past year and I do suspect it is related to her valvular heart disease and cardiomyopathy. I have personally reviewed her echo images. She has severe aortic insufficiency with a dilated left ventricle and reduced LV systolic function. She and her husband are counseled at length today. They understand that even  if she were completely asymptomatic, her echo findings are a class I indication for aortic valve replacement. We had a lengthy discussion regarding treatment options, further evaluation which will include cardiac  catheterization and might include a CT scan of the chest. She will be referred to Dr. Tyrone Sage for consideration of aortic valve replacement. She would like to wait until after the first of the year to do her heart catheterization. I have reviewed the risks, indications, and alternatives to cardiac catheterization with the patient. Risks include but are not limited to bleeding, infection, vascular injury, stroke, myocardial infection, arrhythmia, kidney injury, radiation-related injury in the case of prolonged fluoroscopy use, emergency cardiac surgery, and death. The patient understands the risks of serious complication is 1-2 in 1000 with diagnostic cardiac cath.  I advised her that she can continue with her low level exercise, but should not participating in any vigorous exercise at this point. Plan to initiate medical therapy for cardiomyopathy following surgery. However, in the setting of severe aortic insufficiency, I don't think it would be prudent to start her on a beta blocker which could potentially have consequences of prolonging her ventricular filling time, increasing stroke volume, and widening her pulse pressure further.  Current medicines are reviewed with the patient today.  The patient does not have concerns regarding medicines.  Labs/ tests ordered today include:  No orders of the defined types were placed in this encounter.  Disposition:   Referral to [facility] - Dr Tyrone Sage, schedule cardiac cath for preoperative assessment  Signed, Tonny Bollman, MD  02/19/2016 11:08 AM    [facility] [address] [place], [place], [place]  [address] Phone: [phone]; Fax: [phone]   ADDENDUM 03-26-2016: Pt evaluated. No change in clinical symptoms since her last evaluation outlined above. She has seen Dr Tyrone Sage with [facility] and plans for aortic valve replacement after undergoing diagnostic right and left heart catheterization today. All questions answered. Reviewed  risks and indications of cardiac cath again with the patient and her husband.   Tonny Bollman 04/05/2016 12:37 PM

## 2016-04-05 NOTE — Discharge Instructions (Signed)

## 2016-04-07 ENCOUNTER — Ambulatory Visit (INDEPENDENT_AMBULATORY_CARE_PROVIDER_SITE_OTHER): Payer: BC Managed Care – PPO | Admitting: Cardiothoracic Surgery

## 2016-04-07 ENCOUNTER — Other Ambulatory Visit: Payer: Self-pay | Admitting: *Deleted

## 2016-04-07 VITALS — BP 143/67 | HR 77 | Resp 16 | Ht 64.0 in | Wt 140.0 lb

## 2016-04-07 DIAGNOSIS — I350 Nonrheumatic aortic (valve) stenosis: Secondary | ICD-10-CM

## 2016-04-07 DIAGNOSIS — I351 Nonrheumatic aortic (valve) insufficiency: Secondary | ICD-10-CM

## 2016-04-07 NOTE — [place] Notes (Signed)
[address]       [place] [id number]             [phone]                    [name] [facility] Medical Record #[id number] Date of Birth: [birth year]  Referring: Tonny Bollman, MD Primary Care: Betty Swaziland, MD  Chief Complaint:    No chief complaint on file.   History of Present Illness:    Wanda [name] 66 y.o. female is seen in the office  today for Evaluation of aortic insufficiency. The patient was seen 01/18/2016 by cardiology because of  an abnormal EKG and newly diagnosed heart murmur. She was noted to have ventricular bigeminy on EKG. She had a heart murmur suggestive of aortic insufficiency and an echocardiogram was done. The echocardiogram demonstrated severe aortic insufficiency, dilated left ventricle, and mild to moderate LV systolic dysfunction with LVEF 40-45%. Holter monitor demonstrated frequent PVCs and periods of ventricular bigeminy. Heart catheterization has not been done  She has shortness of breath with climbing stairs. She is able to participating low level exercise without any exertional symptoms. She has not had heart palpitations, chest pain, chest pressure, lightheadedness, or syncope. She denies orthopnea, PND, or leg swelling.   Patient's family history is significant for her brother Wanda [name] date of birth 09/[birth year]1940 who had a history of pulmonary embolus and DVT in his late 71s. In May 2004 he underwent aortic valve replacement with a 25 mechanical St. Jude valve and ligation of his left atrial appendage for a critically stenotic bicuspid aortic valve.  In 2080 presented with advanced stage lung cancer which he ultimately succumbed to. I do not have specific records concerning the brothers hypercoagulable state but he had been started on lifelong Coumadin by his hematologist prior to valve replacement.   Current Activity/ Functional Status:  Patient is independent with mobility/ambulation, transfers, ADL's,  IADL's.   Zubrod Score: At the time of surgery this patient's most appropriate activity status/level should be described as: []     0    Normal activity, no symptoms []     1    Restricted in physical strenuous activity but ambulatory, able to do out light work []     2    Ambulatory and capable of self care, unable to do work activities, up and about               >50 % of waking hours                              []     3    Only limited self care, in bed greater than 50% of waking hours []     4    Completely disabled, no self care, confined to bed or chair []     5    Moribund   Past Medical History:  Diagnosis Date  . Anxiety   . Migraine     Past Surgical History:  Procedure Laterality Date  . TONSILLECTOMY AND ADENOIDECTOMY      Family History  Problem Relation Age of Onset  . Diabetes Mother   . Ovarian cancer Maternal Grandmother 80  . Breast cancer Sister 82  . Congestive Heart Failure Maternal Grandfather   . Heart disease Brother     valve replacement  . Deep vein thrombosis Father   .  Deep vein thrombosis Brother   . Deep vein thrombosis Paternal Aunt     x2  . Deep vein thrombosis Paternal Grandmother   . Lung cancer Brother     Social History   Social History  . Marital status: Married    Spouse name: N/A  . Number of children: N/A  . Years of education: N/A   Occupational History  . Not on file.   Social History Main Topics  . Smoking status: Former Games developer  . Smokeless tobacco: Never Used     Comment: in college  . Alcohol use Yes     Comment: 1-2 glasses of wine  . Drug use: No  . Sexual activity: Yes    Partners: Male    Birth control/ protection: Other-see comments, Post-menopausal     Comment: vasectomy   Other Topics Concern  . Not on file   Social History Narrative  . No narrative on file    History  Smoking Status  . Former Smoker  Smokeless Tobacco  . Never Used    Comment: in college    History  Alcohol Use  . Yes     Comment: 1-2 glasses of wine     Allergies  Allergen Reactions  . Codeine Nausea And Vomiting    Current Outpatient Prescriptions  Medication Sig Dispense Refill  . amphetamine-dextroamphetamine (ADDERALL) 20 MG tablet Take 20 mg by mouth daily. Reported on 08/18/2015    . estradiol (MINIVELLE) 0.05 MG/24HR patch Place 1 patch (0.05 mg total) onto the skin 2 (two) times a week. 8 patch 13  . ibuprofen (ADVIL,MOTRIN) 200 MG tablet Take 200-400 mg by mouth every 6 (six) hours as needed (pain).     . LORazepam (ATIVAN) 0.5 MG tablet Take 0.5 mg by mouth daily as needed for anxiety.     . progesterone (PROMETRIUM) 100 MG capsule Take 100 mg by mouth at bedtime.    . pseudoephedrine (SUDAFED) 30 MG tablet Take 30-60 mg by mouth 2 (two) times daily as needed for congestion.    . rizatriptan (MAXALT-MLT) 10 MG disintegrating tablet Take 10 mg by mouth once as needed for migraine. May repeat in 2 hours if needed     No current facility-administered medications for this visit.       Review of Systems:     Cardiac Review of Systems: Y or N  Chest Pain [ n   ]  Resting SOB [n   ] Exertional SOB  Cove.Etienne  ]  Orthopnea [  n]   Pedal Edema [ n  ]    Palpitations Cove.Etienne  ] Syncope  [n  ]   Presyncope [ y  ]  General Review of Systems: [Y] = yes [  ]=no Constitional: recent weight change [ n ];  Wt loss over the last 3 months [   ] anorexia [  ]; fatigue [  ]; nausea [  ]; night sweats [  ]; fever [  ]; or chills [  ];          Dental: poor dentition[ n ]; Last Dentist visit: 3 months ago  Eye : blurred vision [  ]; diplopia [   ]; vision changes [  ];  Amaurosis fugax[  ]; Resp: cough [ n ];  wheezing[n  ];  hemoptysis[  ]; shortness of breath[  ]; paroxysmal nocturnal dyspnea[  ]; dyspnea on exertion[y  ]; or orthopnea[n  ];  GI:  gallstones[  ], vomiting[  ];  dysphagia[  ]; melena[  ];  hematochezia [  ]; heartburn[  ];   Hx of  Colonoscopy[  ]; GU: kidney stones [  ]; hematuria[  ];   dysuria [  ];   nocturia[  ];  history of     obstruction [  ]; urinary frequency [  ]             Skin: rash, swelling[  ];, hair loss[  ];  peripheral edema[  ];  or itching[  ]; Musculosketetal: myalgias[ n ];  joint swelling[  ];  joint erythema[n  ];  joint pain[  ];  back pain[  ];  Heme/Lymph: bruising[n  ];  bleeding[ n ];  anemia[n  ];  Neuro: TIA[ n ];  headaches[y  ];  stroke[ n ];  vertigo[ n ];  seizures[n  ];   paresthesias[  ];  difficulty walking[  n];  Psych:depression[  ]; anxiety[y  ];  Endocrine: diabetes[ n ];  thyroid dysfunction[ n ];  Immunizations: Flu up to date [  ]; Pneumococcal up to date [  ];  Other:  Physical Exam: LMP 08/12/2004   PHYSICAL EXAMINATION: General appearance: alert, cooperative, appears stated age, distracted and no distress Head: Normocephalic, without obvious abnormality, atraumatic Neck: no adenopathy, no carotid bruit, no JVD, supple, symmetrical, trachea midline and thyroid not enlarged, symmetric, no tenderness/mass/nodules Lymph nodes: Cervical, supraclavicular, and axillary nodes normal. Resp: clear to auscultation bilaterally Back: symmetric, no curvature. ROM normal. No CVA tenderness. Cardio: diastolic murmur: holodiastolic 4/6, blowing throughout the precordium GI: soft, non-tender; bowel sounds normal; no masses,  no organomegaly Extremities: extremities normal, atraumatic, no cyanosis or edema and Homans sign is negative, no sign of DVT Neurologic: Grossly normal  Diagnostic Studies & Laboratory data:     Recent Radiology Findings:   Ct Angio Chest Aorta W &/or Wo Contrast  Result Date: 03/24/2016 CLINICAL DATA:  Shortness of breath. History of aortic insufficiency. EXAM: CT ANGIOGRAPHY CHEST WITH CONTRAST TECHNIQUE: Multidetector CT imaging of the chest was performed using the standard protocol during bolus administration of intravenous contrast. Multiplanar CT image reconstructions and MIPs were obtained to evaluate the vascular anatomy.  CONTRAST:  75 cc Isovue 370 COMPARISON:  Chest x-ray 02/01/2008 FINDINGS: Chest wall: No breast masses, supraclavicular or axillary lymphadenopathy. The thyroid gland appears normal. Cardiovascular: The heart is within normal limits in size. The left ventricle is dilated. Moderate thinning of the myocardium at the apex of the left ventricle possibly due to remote infarct. No definite aneurysm. The aorta is normal in caliber. No dissection or atherosclerotic calcifications. The branch vessels are normal. A few scattered coronary artery calcifications are noted. The pulmonary arteries appear normal. No pericardial effusion. Mediastinum/Nodes: No mesenteric or retroperitoneal mass or adenopathy. The esophagus is grossly normal. Lungs/Pleura: No acute pulmonary findings. No infiltrates, edema or pneumothorax. There are small scattered pulmonary nodules. The largest nodule is in the left upper lobe on image number 62 measures 5 mm. There is also a 3 mm right lower lobe nodule on image number 65 an 80 4 mm nodule in the left lower lobe on image number 109. No pleural effusion. No bronchiectasis or interstitial lung disease. Upper Abdomen: No significant upper abdominal findings. The upper abdominal aorta and branch vessels. Musculoskeletal: No significant bony findings.  The Review of the MIP images confirms the above findings. IMPRESSION: 1. Normal CT appearance of the thoracic aorta and upper abdominal aorta. No aneurysm or dissection. 2. Dilated left ventricle  and findings suspicious for remote infarct at the apex of the left ventricle. 3. Small pulmonary nodules as discussed above. No follow-up needed if patient is low-risk (and has no known or suspected primary neoplasm). Non-contrast chest CT can be considered in 12 months if patient is high-risk. This recommendation follows the consensus statement: Guidelines for Management of Incidental Pulmonary Nodules Detected on CT Images: From the Fleischner Society 2017;  Radiology 2017; 284:228-243. Electronically Signed   By: Rudie Meyer M.D.   On: 03/24/2016 12:34     I have independently reviewed the above radiologic studies.  Recent Lab Findings: Lab Results  Component Value Date   WBC 3.9 03/23/2016   HGB 13.6 08/18/2015   HCT 37.1 03/23/2016   PLT 256 03/23/2016   GLUCOSE 93 03/23/2016   CHOL 247 (H) 08/18/2015   TRIG 73 08/18/2015   HDL 108 08/18/2015   LDLCALC 124 08/18/2015   ALT 12 08/18/2015   AST 16 08/18/2015   NA 142 03/23/2016   K 4.9 03/23/2016   CL 102 03/23/2016   CREATININE 1.17 (H) 03/23/2016   BUN 19 03/23/2016   CO2 23 03/23/2016   TSH 1.39 08/18/2015   INR 1.0 03/23/2016     ECHO: Result status: Final result                           *[facility]*                        [address]                        [place], [place] [address]                            [phone]  ------------------------------------------------------------------- Transthoracic Echocardiography  Patient:    Minnetta, Sandora MR #:       [id number] Study Date: 02/08/2016 Gender:     F Age:        67 Height:     161.3 cm Weight:     65 kg BSA:        1.72 m^2 Pt. Status: Room:   ATTENDING    Tonny Bollman, MD  ORDERING     Tonny Bollman, MD  REFERRING    Tonny Bollman, MD  SONOGRAPHER  Dewitt Hoes, RDCS  PERFORMING   [facility], Outpatient  cc:  ------------------------------------------------------------------- LV EF: 40% -   45%  ------------------------------------------------------------------- Indications:      Murmur (R01.1).  ------------------------------------------------------------------- History:   Risk factors:  Former tobacco use.  ------------------------------------------------------------------- Study Conclusions  - Left ventricle: The cavity size was moderately dilated. Wall   thickness was normal. Systolic function was mildly to moderately   reduced. The estimated ejection fraction  was in the range of 40%   to 45%. Mild diffuse hypokinesis with no identifiable regional   variations. Features are consistent with a pseudonormal left   ventricular filling pattern, with concomitant abnormal relaxation   and increased filling pressure (grade 2 diastolic dysfunction). - Aortic valve: Possibly bicuspid; mildly thickened, mildly   calcified leaflets. There was very mild stenosis. There was   severe regurgitation directed eccentrically in the LVOT and   towards the mitral anterior leaflet. - Mitral valve: There was mild regurgitation. - Left atrium: The atrium was mildly dilated. - Atrial septum: No defect or  patent foramen ovale was identified.  ------------------------------------------------------------------- Study data:  No prior study was available for comparison.  Study status:  Routine.  Procedure:  The patient reported no pain pre or post test. Transthoracic echocardiography. Image quality was adequate.  Study completion:  There were no complications. Transthoracic echocardiography.  M-mode, complete 2D, spectral Doppler, and color Doppler.  Birthdate:  Patient birthdate: [birth year].  Age:  Patient is 66 yr old.  Sex:  Gender: female. BMI: 25 kg/m^2.  Blood pressure:     150/50  Patient status: Outpatient.  Study date:  Study date: 02/08/2016. Study time: 01:08 PM.  Location:  [facility]  -------------------------------------------------------------------  ------------------------------------------------------------------- Left ventricle:  The cavity size was moderately dilated. Wall thickness was normal. Systolic function was mildly to moderately reduced. The estimated ejection fraction was in the range of 40% to 45%.  Mild diffuse hypokinesis with no identifiable regional variations. Features are consistent with a pseudonormal left ventricular filling pattern, with concomitant abnormal relaxation and increased filling pressure (grade 2 diastolic  dysfunction).  ------------------------------------------------------------------- Aortic valve:  Poorly visualized.  Possibly bicuspid; mildly thickened, mildly calcified leaflets.  Doppler:   There was very mild stenosis.   There was severe regurgitation directed eccentrically in the LVOT and towards the mitral anterior leaflet.   VTI ratio of LVOT to aortic valve: 0.56. Valve area (VTI): 1.77 cm^2. Indexed valve area (VTI): 1.03 cm^2/m^2. Peak velocity ratio of LVOT to aortic valve: 0.54. Valve area (Vmax): 1.7 cm^2. Indexed valve area (Vmax): 0.99 cm^2/m^2. Mean velocity ratio of LVOT to aortic valve: 0.54. Valve area (Vmean): 1.71 cm^2. Indexed valve area (Vmean): 0.99 cm^2/m^2.    Mean gradient (S): 14 mm Hg. Peak gradient (S): 28 mm Hg.  ------------------------------------------------------------------- Aorta:  The aorta was without evidence of coarctation. Aortic root: The aortic root was normal in size. Ascending aorta: The ascending aorta was normal in size.  ------------------------------------------------------------------- Mitral valve:   Mildly thickened leaflets .  Doppler:  There was mild regurgitation.    Peak gradient (D): 8 mm Hg.  ------------------------------------------------------------------- Left atrium:  The atrium was mildly dilated.  ------------------------------------------------------------------- Atrial septum:  No defect or patent foramen ovale was identified.   ------------------------------------------------------------------- Right ventricle:  The cavity size was normal. Wall thickness was normal. Systolic function was normal.  ------------------------------------------------------------------- Pulmonic valve:    Structurally normal valve.   Cusp separation was normal.  Doppler:  Transvalvular velocity was within the normal range. There was no  regurgitation.  ------------------------------------------------------------------- Tricuspid valve:   Structurally normal valve.   Leaflet separation was normal.  Doppler:  Transvalvular velocity was within the normal range. There was no regurgitation.  ------------------------------------------------------------------- Pulmonary artery:    Systolic pressure could not be accurately estimated.  ------------------------------------------------------------------- Right atrium:  The atrium was normal in size.  ------------------------------------------------------------------- Pericardium:  There was no pericardial effusion.  ------------------------------------------------------------------- Systemic veins: Inferior vena cava: The vessel was normal in size. The respirophasic diameter changes were in the normal range (>= 50%), consistent with normal central venous pressure.  ------------------------------------------------------------------- Post procedure conclusions Ascending Aorta:  - The aorta was without evidence of coarctation.  ------------------------------------------------------------------- Measurements   Left ventricle                            Value          Reference  LV ID, ED, PLAX chordal           (H)     64.3  mm       43 - 52  LV ID, ES, PLAX chordal           (H)     50.1  mm       23 - 38  LV fx shortening, PLAX chordal    (L)     22    %        >=29  LV PW thickness, ED                       9.4   mm       ---------  IVS/LV PW ratio, ED                       0.71           <=1.3  Stroke volume, 2D                         105   ml       ---------  Stroke volume/bsa, 2D                     61    ml/m^2   ---------  LV end-diastolic volume, 1-p A4C          195   ml       ---------  LV end-systolic volume, 1-p A4C           113   ml       ---------  LV ejection fraction, 1-p A4C             42    %        ---------  Stroke volume, 1-p A4C                     82    ml       ---------  LV end-diastolic volume/bsa, 1-p          114   ml/m^2   ---------  A4C  LV end-systolic volume/bsa, 1-p           66    ml/m^2   ---------  A4C  Stroke volume/bsa, 1-p A4C                48    ml/m^2   ---------  LV e&', lateral                            6.09  cm/s     ---------  LV E/e&', lateral                          22.66          ---------  LV e&', medial                             4.35  cm/s     ---------  LV E/e&', medial                           31.72          ---------  LV e&', average  5.22  cm/s     ---------  LV E/e&', average                          26.44          ---------    Ventricular septum                        Value          Reference  IVS thickness, ED                         6.66  mm       ---------    LVOT                                      Value          Reference  LVOT ID, S                                20    mm       ---------  LVOT area                                 3.14  cm^2     ---------  LVOT peak velocity, S                     142   cm/s     ---------  LVOT mean velocity, S                     91.5  cm/s     ---------  LVOT VTI, S                               33.4  cm       ---------  LVOT peak gradient, S                     8     mm Hg    ---------    Aortic valve                              Value          Reference  Aortic valve peak velocity, S             263   cm/s     ---------  Aortic valve mean velocity, S             168   cm/s     ---------  Aortic valve VTI, S                       59.2  cm       ---------  Aortic mean gradient, S                   14    mm Hg    ---------  Aortic peak gradient, S  28    mm Hg    ---------  VTI ratio, LVOT/AV                        0.56           ---------  Aortic valve area, VTI                    1.77  cm^2     ---------  Aortic valve area/bsa, VTI                1.03  cm^2/m^2 ---------  Velocity ratio, peak,  LVOT/AV             0.54           ---------  Aortic valve area, peak velocity          1.7   cm^2     ---------  Aortic valve area/bsa, peak               0.99  cm^2/m^2 ---------  velocity  Velocity ratio, mean, LVOT/AV             0.54           ---------  Aortic valve area, mean velocity          1.71  cm^2     ---------  Aortic valve area/bsa, mean               0.99  cm^2/m^2 ---------  velocity  Aortic regurg pressure half-time          234   ms       ---------    Aorta                                     Value          Reference  Aortic root ID, ED                        33    mm       ---------  Ascending aorta ID, A-P, S                32    mm       ---------    Left atrium                               Value          Reference  LA ID, A-P, ES                            35    mm       ---------  LA ID/bsa, A-P                            2.04  cm/m^2   <=2.2  LA volume, S                              70.7  ml       ---------  LA volume/bsa, S  41.1  ml/m^2   ---------  LA volume, ES, 1-p A4C                    52.6  ml       ---------  LA volume/bsa, ES, 1-p A4C                30.6  ml/m^2   ---------  LA volume, ES, 1-p A2C                    89.9  ml       ---------  LA volume/bsa, ES, 1-p A2C                52.3  ml/m^2   ---------    Mitral valve                              Value          Reference  Mitral E-wave peak velocity               138   cm/s     ---------  Mitral A-wave peak velocity               129   cm/s     ---------  Mitral deceleration time          (H)     232   ms       150 - 230  Mitral peak gradient, D                   8     mm Hg    ---------  Mitral E/A ratio, peak                    1.1            ---------    Systemic veins                            Value          Reference  Estimated CVP                             3     mm Hg    ---------    Right ventricle                           Value          Reference  TAPSE                                      24.8  mm       ---------  RV s&', lateral, S                         11.4  cm/s     ---------  Legend: (L)  and  (H)  mark values outside specified reference range.  ------------------------------------------------------------------- Prepared and Electronically Authenticated by  Thurmon Fair, MD 2017-11-27T16:15:17   CATH: Procedures   Right Heart Cath and Coronary Angiography  Conclusion   1. Severe aortic valve regurgitation 2. Widely patent coronary arteries with minimal  irregularity of the LAD at the origin of the first diagonal 3. Normal right heart hemodynamics  Indications   Severe aortic regurgitation [I35.1 (ICD-10-CM)]  Procedural Details/Technique   Technical Details INDICATION: Severe aortic regurgitation, moderate LV dysfunction, preoperative study  PROCEDURAL DETAILS: There was an indwelling IV in a right antecubital vein. Using normal sterile technique, the IV was changed out for a 5 Fr brachial sheath over a 0.018 inch wire. The right wrist was then prepped, draped, and anesthetized with 1% lidocaine. Using the modified Seldinger technique a 5/6 French Slender sheath was placed in the right radial artery. Intra-arterial verapamil was administered through the radial artery sheath. IV heparin was administered after a JR4 catheter was advanced into the central aorta. A Swan-Ganz catheter was used for the right heart catheterization. Standard protocol was followed for recording of right heart pressures and sampling of oxygen saturations. Fick cardiac output was calculated. Standard Judkins catheters were used for selective coronary angiography and aortic root angiography. There were no immediate procedural complications. The patient was transferred to the post catheterization recovery area for further monitoring.       Estimated blood loss <50 mL.  During this procedure the patient was administered the following to achieve and  maintain moderate conscious sedation: Versed 3 mg, Fentanyl 50 mcg, while the patient's heart rate, blood pressure, and oxygen saturation were continuously monitored. The period of conscious sedation was 35 minutes, of which I was present face-to-face 100% of this time.    Coronary Findings   Dominance: Right  Left Anterior Descending  Prox LAD lesion, 20% stenosed. The lesion is calcified. there is minimal irregularity of the proximal LAD at the origin of the first diagonal with associated calcification.  Ramus Intermedius  Vessel is angiographically normal.  Left Circumflex  Vessel is angiographically normal.  Right Coronary Artery  Vessel is angiographically normal.  Right Heart   Right Heart Pressures There is a negative vasodilator response. LV EDP is normal.    Left Heart   Aortic Valve There is severe (4+) aortic regurgitation.    Coronary Diagrams   Diagnostic Diagram     Implants     No implant documentation for this case.  PACS Images   Show images for Cardiac catheterization   Link to Procedure Log   Procedure Log    Hemo Data   Flowsheet Row Most Recent Value  Fick Cardiac Output 5.13 L/min  Fick Cardiac Output Index 3.04 (L/min)/BSA  RA A Wave 15 mmHg  RA V Wave 8 mmHg  RA Mean 7 mmHg  RV Systolic Pressure 29 mmHg  RV Diastolic Pressure 3 mmHg  RV EDP 8 mmHg  PA Systolic Pressure 27 mmHg  PA Diastolic Pressure 13 mmHg  PA Mean 19 mmHg  PW A Wave 18 mmHg  PW V Wave 13 mmHg  PW Mean 11 mmHg  AO Systolic Pressure 141 mmHg  AO Diastolic Pressure 60 mmHg  AO Mean 91 mmHg  LV Systolic Pressure 149 mmHg  LV Diastolic Pressure 10 mmHg  LV EDP 18 mmHg  Arterial Occlusion Pressure Extended Systolic Pressure 133 mmHg  Arterial Occlusion Pressure Extended Diastolic Pressure 50 mmHg  Arterial Occlusion Pressure Extended Mean Pressure 81 mmHg  Left Ventricular Apex Extended Systolic Pressure 141 mmHg  Left Ventricular Apex Extended Diastolic Pressure 9  mmHg  Left Ventricular Apex Extended EDP Pressure 20 mmHg  QP/QS 1.24  TPVR Index 5.06 HRUI  TSVR Index 30 HRUI  PVR SVR Ratio 0.08  TPVR/TSVR Ratio  0.17     Assessment / Plan:   1/Patient with symptomatic aortic insufficiency with a bicuspid aortic valve and depressed ejection fraction- 2/ Normal CT appearance of the thoracic aorta and upper abdominal aorta. No aneurysm or dissection.      The patient has a family history in a brother of hypercoagulable state, her brother also had a bicuspid aortic valve and aortic valve replacement for severe aortic stenosis.  [name] MD      [address] [place] [place] [address] Office [phone]   Beeper [phone]  04/07/2016 11:11 AM

## 2016-04-28 NOTE — Pre-Procedure Instructions (Signed)
Wanda [name]  04/28/2016      [facility] - [place], [place] - [place] [address] [place] [address] Phone: [phone] Fax: [phone]    Your procedure is scheduled on Tues, Feb 20 @ 7:30 AM  Report to [facility] Admitting at 5:30 AM  Call this number if you have problems the morning of surgery:  [phone]   Remember:  Do not eat food or drink liquids after midnight.  Take these medicines the morning of surgery with A SIP OF WATER Ativan(Lorazepam)              Stop taking any Vitamins or Herbal Medications. No Goody's,BC's,Aleve,Advil,Motrin,Ibuprofen, or Fish Oil.    Do not wear jewelry, make-up or nail polish.  Do not wear lotions, powders, perfumes, or deoderant.  Do not shave 48 hours prior to surgery.    Do not bring valuables to the hospital.  [facility] is not responsible for any belongings or valuables.  Contacts, dentures or bridgework may not be worn into surgery.  Leave your suitcase in the car.  After surgery it may be brought to your room.  For patients admitted to the hospital, discharge time will be determined by your treatment team.  Patients discharged the day of surgery will not be allowed to drive home.    [facility]  Before surgery, you can play an important role.  Because skin is not sterile, your skin needs to be as free of germs as possible.  You can reduce the number of germs on you skin by washing with CHG (chlorahexidine gluconate) soap before surgery.  CHG is an antiseptic cleaner which kills germs and bonds with the skin to continue killing germs even after washing.  Please DO NOT use if you have an allergy to CHG or antibacterial soaps.  If your skin becomes reddened/irritated stop using the CHG and inform your nurse when you arrive at Short Stay.  Do not shave (including legs and underarms) for at least 48 hours prior to the first CHG  shower.  You may shave your face.  Please follow these instructions carefully:   1.  Shower with CHG Soap the night before surgery and the                                morning of Surgery.  2.  If you choose to wash your hair, wash your hair first as usual with your       normal shampoo.  3.  After you shampoo, rinse your hair and body thoroughly to remove the                      Shampoo.  4.  Use CHG as you would any other liquid soap.  You can apply chg directly       to the skin and wash gently with scrungie or a clean washcloth.  5.  Apply the CHG Soap to your body ONLY FROM THE NECK DOWN.        Do not use on open wounds or open sores.  Avoid contact with your eyes,       ears, mouth and genitals (private parts).  Wash genitals (private parts)       with your normal soap.  6.  Wash thoroughly, paying special attention to the area  where your surgery        will be performed.  7.  Thoroughly rinse your body with warm water from the neck down.  8.  DO NOT shower/wash with your normal soap after using and rinsing off       the CHG Soap.  9.  Pat yourself dry with a clean towel.            10.  Wear clean pajamas.            11.  Place clean sheets on your bed the night of your first shower and do not        sleep with pets.  Day of Surgery  Do not apply any lotions/deoderants the morning of surgery.  Please wear clean clothes to the hospital/surgery center.    Please read over the following fact sheets that you were given. Pain Booklet, Coughing and Deep Breathing, MRSA Information and Surgical Site Infection Prevention

## 2016-04-29 ENCOUNTER — Encounter (HOSPITAL_COMMUNITY)
Admission: RE | Admit: 2016-04-29 | Discharge: 2016-04-29 | Disposition: A | Discharge status: Home / Self Care | Payer: BC Managed Care – PPO | Source: Ambulatory Visit | Attending: Cardiothoracic Surgery | Admitting: Cardiothoracic Surgery

## 2016-04-29 ENCOUNTER — Ambulatory Visit (HOSPITAL_COMMUNITY)
Admission: RE | Admit: 2016-04-29 | Discharge: 2016-04-29 | Disposition: A | Discharge status: Home / Self Care | Payer: BC Managed Care – PPO | Source: Ambulatory Visit | Attending: Cardiothoracic Surgery | Admitting: Cardiothoracic Surgery

## 2016-04-29 ENCOUNTER — Encounter (HOSPITAL_COMMUNITY): Payer: Self-pay

## 2016-04-29 ENCOUNTER — Ambulatory Visit (HOSPITAL_BASED_OUTPATIENT_CLINIC_OR_DEPARTMENT_OTHER)
Admission: RE | Admit: 2016-04-29 | Discharge: 2016-04-29 | Disposition: A | Discharge status: Home / Self Care | Payer: BC Managed Care – PPO | Source: Ambulatory Visit | Attending: Cardiothoracic Surgery | Admitting: Cardiothoracic Surgery

## 2016-04-29 ENCOUNTER — Other Ambulatory Visit: Payer: Self-pay

## 2016-04-29 DIAGNOSIS — F419 Anxiety disorder, unspecified: Secondary | ICD-10-CM | POA: Insufficient documentation

## 2016-04-29 DIAGNOSIS — Z87891 Personal history of nicotine dependence: Secondary | ICD-10-CM | POA: Insufficient documentation

## 2016-04-29 DIAGNOSIS — I352 Nonrheumatic aortic (valve) stenosis with insufficiency: Secondary | ICD-10-CM | POA: Insufficient documentation

## 2016-04-29 DIAGNOSIS — Z79899 Other long term (current) drug therapy: Secondary | ICD-10-CM | POA: Diagnosis not present

## 2016-04-29 DIAGNOSIS — Z01818 Encounter for other preprocedural examination: Secondary | ICD-10-CM | POA: Diagnosis not present

## 2016-04-29 DIAGNOSIS — Z0183 Encounter for blood typing: Secondary | ICD-10-CM | POA: Insufficient documentation

## 2016-04-29 DIAGNOSIS — Z01812 Encounter for preprocedural laboratory examination: Secondary | ICD-10-CM | POA: Diagnosis not present

## 2016-04-29 DIAGNOSIS — I351 Nonrheumatic aortic (valve) insufficiency: Secondary | ICD-10-CM

## 2016-04-29 DIAGNOSIS — M1990 Unspecified osteoarthritis, unspecified site: Secondary | ICD-10-CM | POA: Diagnosis not present

## 2016-04-29 DIAGNOSIS — F329 Major depressive disorder, single episode, unspecified: Secondary | ICD-10-CM | POA: Diagnosis not present

## 2016-04-29 HISTORY — DX: Unspecified osteoarthritis, unspecified site: M19.90

## 2016-04-29 HISTORY — DX: Depression, unspecified: F32.A

## 2016-04-29 HISTORY — DX: Dyspnea, unspecified: R06.00

## 2016-04-29 HISTORY — DX: Major depressive disorder, single episode, unspecified: F32.9

## 2016-04-29 LAB — PROTIME-INR
INR: 0.98
Prothrombin Time: 13 seconds (ref 11.4–15.2)

## 2016-04-29 LAB — PULMONARY FUNCTION TEST
DL/VA % pred: 76 %
DL/VA: 3.69 ml/min/mmHg/L
DLCO unc % pred: 64 %
DLCO unc: 15.64 ml/min/mmHg
FEF 25-75 Post: 2.15 L/sec
FEF 25-75 Pre: 1.73 L/sec
FEF2575-%Change-Post: 24 %
FEF2575-%Pred-Post: 98 %
FEF2575-%Pred-Pre: 79 %
FEV1-%Change-Post: 6 %
FEV1-%Pred-Post: 90 %
FEV1-%Pred-Pre: 84 %
FEV1-Post: 2.21 L
FEV1-Pre: 2.08 L
FEV1FVC-%Change-Post: 2 %
FEV1FVC-%Pred-Pre: 98 %
FEV6-%Change-Post: 2 %
FEV6-%Pred-Post: 92 %
FEV6-%Pred-Pre: 89 %
FEV6-Post: 2.83 L
FEV6-Pre: 2.75 L
FEV6FVC-%Pred-Post: 104 %
FEV6FVC-%Pred-Pre: 104 %
FVC-%Change-Post: 4 %
FVC-%Pred-Post: 89 %
FVC-%Pred-Pre: 86 %
FVC-Post: 2.86 L
FVC-Pre: 2.75 L
Post FEV1/FVC ratio: 77 %
Post FEV6/FVC ratio: 100 %
Pre FEV1/FVC ratio: 75 %
Pre FEV6/FVC Ratio: 100 %
RV % pred: 111 %
RV: 2.31 L
TLC % pred: 102 %
TLC: 5.2 L

## 2016-04-29 LAB — VAS US DOPPLER PRE CABG
LEFT ECA DIAS: -10 cm/s
LEFT VERTEBRAL DIAS: 7 cm/s
Left CCA dist dias: 8 cm/s
Left CCA dist sys: 75 cm/s
Left CCA prox dias: 11 cm/s
Left CCA prox sys: 128 cm/s
Left ICA dist dias: -19 cm/s
Left ICA dist sys: -71 cm/s
Left ICA prox dias: 12 cm/s
Left ICA prox sys: 48 cm/s
RIGHT ECA DIAS: -9 cm/s
RIGHT VERTEBRAL DIAS: 9 cm/s
Right CCA prox dias: 11 cm/s
Right CCA prox sys: 129 cm/s
Right cca dist sys: -76 cm/s

## 2016-04-29 LAB — CBC
HCT: 35.3 % — ABNORMAL LOW (ref 36.0–46.0)
Hemoglobin: 11.5 g/dL — ABNORMAL LOW (ref 12.0–15.0)
MCH: 30.6 pg (ref 26.0–34.0)
MCHC: 32.6 g/dL (ref 30.0–36.0)
MCV: 93.9 fL (ref 78.0–100.0)
Platelets: 283 10*3/uL (ref 150–400)
RBC: 3.76 MIL/uL — ABNORMAL LOW (ref 3.87–5.11)
RDW: 13.6 % (ref 11.5–15.5)
WBC: 5.6 10*3/uL (ref 4.0–10.5)

## 2016-04-29 LAB — COMPREHENSIVE METABOLIC PANEL
ALT: 12 U/L — ABNORMAL LOW (ref 14–54)
AST: 17 U/L (ref 15–41)
Albumin: 4.1 g/dL (ref 3.5–5.0)
Alkaline Phosphatase: 37 U/L — ABNORMAL LOW (ref 38–126)
Anion gap: 7 (ref 5–15)
BUN: 12 mg/dL (ref 6–20)
CO2: 25 mmol/L (ref 22–32)
Calcium: 9.2 mg/dL (ref 8.9–10.3)
Chloride: 107 mmol/L (ref 101–111)
Creatinine, Ser: 0.98 mg/dL (ref 0.44–1.00)
GFR calc Af Amer: >60 mL/min (ref >60)
GFR calc non Af Amer: >60 mL/min (ref >60)
Glucose, Bld: 161 mg/dL — ABNORMAL HIGH (ref 65–99)
Potassium: 4.3 mmol/L (ref 3.5–5.1)
Sodium: 139 mmol/L (ref 135–145)
Total Bilirubin: 0.8 mg/dL (ref 0.3–1.2)
Total Protein: 6.3 g/dL — ABNORMAL LOW (ref 6.5–8.1)

## 2016-04-29 LAB — ABO/RH: ABO/RH(D): A NEG

## 2016-04-29 LAB — SURGICAL PCR SCREEN
MRSA, PCR: NEGATIVE
Staphylococcus aureus: NEGATIVE

## 2016-04-29 LAB — APTT: aPTT: 25 seconds (ref 24–36)

## 2016-04-29 LAB — URINALYSIS, ROUTINE W REFLEX MICROSCOPIC
Bilirubin Urine: NEGATIVE
Glucose, UA: NEGATIVE mg/dL
Hgb urine dipstick: NEGATIVE
Ketones, ur: NEGATIVE mg/dL
Leukocytes, UA: NEGATIVE
Nitrite: NEGATIVE
Protein, ur: NEGATIVE mg/dL
Specific Gravity, Urine: 1.005 (ref 1.005–1.030)
pH: 6 (ref 5.0–8.0)

## 2016-04-29 MED ORDER — ALBUTEROL SULFATE (2.5 MG/3ML) 0.083% IN NEBU
2.5000 mg | INHALATION_SOLUTION | Freq: Once | RESPIRATORY_TRACT | Status: AC
  Administered 2016-04-29: 2.5 mg via RESPIRATORY_TRACT

## 2016-04-29 NOTE — Progress Notes (Signed)
Called into the lab area by lab staff member. Pt face is flushed and leaning to the right side. Bp 81/30 heart rate 55. Skin cold and moist to touch. O2 sat 100% on Ra. Pt went unresponsive for a few sec. Regained responsiveness Alert and Orient. Rechecked bp at 1415 was 77/34 heart rate 51-55 o2 sat 100% on Ra. Called  Charlette Caffey called to assess pt. Assisted pt to a stretcher. BP rechecked at 1420 manual 78/26. Rapid responsive nurse called.  Rechecked at 1425 111/43 heart rate  64,  100% o2 sat on Ra. Rapid responsive nurse here to assess pt. Pt taken for an EKG.

## 2016-04-29 NOTE — Progress Notes (Signed)
Pre-op Cardiac Surgery  Carotid Findings:   There is no obvious evidence of hemodynamically significant internal artery stenosis bilaterally. Vertebral arteries are patent with antegrade flow.  Upper Extremity Right Left  Brachial Pressures 134-Triphasic 124-Triphasic  Radial Waveforms Triphasic Triphasic  Ulnar Waveforms Triphasic Triphasic  Palmar Arch (Allen's Test) Signal decreases <50% with radial compression, is unaffected with ulnar compression. Signal obliterates with radial compression, decreases >50% with ulnar compression.   04/29/2016 12:24 PM Gertie Fey, BS, RVT, RDCS, RDMS

## 2016-04-29 NOTE — Progress Notes (Signed)
Anesthesia PAT Evaluation: Patient is a 66 year old female scheduled for AVR on 05/03/16 by Dr. Tyrone Sage.   History includes severe AR, mild AS, former smoker, anxiety, migraines, arthritis, depression, T&A.   PCP is listed as Dr. Betty Swaziland. Cardiologist is Dr. Tonny Bollman.  Meds include Adderall, estradiol, Ativan, progesterone, Sudafed, Maxalt-MLT.  BP (!) 122/58   Pulse 81   Temp 36.8 C   Resp 20   Ht 5\' 4"  (1.626 m)   Wt 143 lb 1.6 oz (64.9 kg)   LMP 08/12/2004   SpO2 100%   BMI 24.56 kg/m   I was called to evaluate patient around 1415 following a brief syncopal episode (while sitting) during her ABG attempt.  1410: Per Scronce, RN: Syncopal episode for a few seconds. BP 81/30, HR 55, O2 sat 100%. Skin cold, moist. Pale.  1415: BP 77/34, HR 55, O2 sat 100%.  1420: BP 78/56. I arrived with gurney. 1425: (Patient on gurney with RN assist, HOB ~ 30-40 elevated) BP 111/34, HR 64, O2 sat 100%. Rapid response RN Stanton Kidney also at patient's side. Color improving. Patient A&O, facial symmetry present. No increased work of breathing. 1432: EKG done. Heart RRR with occasional ectopic beat. Holodiastolic murmur present. Lungs clear. A&O X 4. Facial symmetry present. Cardiology paged to reviewed. Between this time and 1500. Patient sat on edge of gurney. SBP > 110. HR 60's with short runs of bigeminy PVCs. I spoke with cardiologist Dr. Eden Emms. Patient with known AR and frequent PVCs (including bigeminy), only mild AS, and no significant CAD. She has known history of syncope with blood draws. She denied CP or acute SOB. She is aware of her PVC's, but felt this was also unchanged. She reported still feeling "weak", but she did not want or feel that she needed to go to the ED. Dr. Eden Emms felt that patient likely had a vasovagal syncope episode associated with her attempted ABG draw. If she continued to improve then he did not feel that she needed cardiology re-evaluation or ED visit at this point.   1504: Patient was able to walk down the PAT hallway on her own and stand for her CXR without recurrent syncope/presyncope. 1520: Following her CXR, patient still needed venous labs. ABG will be deferred to the day of surgery. Because she had also had history of syncope with venous blood draw, I decided to put her back on a gurney. She was taken over to the Holding area and placed on a monitor and labs drawn by RN with her lying with HOB down. She did not have recurrent syncope. She continued to have intermittent runs of bigeminy PVCs with underlying SR. Anesthesiologist Dr. Jacklynn Bue also briefly spoke with patient.  1525: BP sitting 136/49, HR 64. BP standing 124/33, HR 71. 1535: BP sitting 113/34, HR 68. BP standing 127/33, HR 69. Patient feels she continues to improve, but still feels weak. Color remains pink (not pale). She wants to go home. Discussed with Dr. Jacklynn Bue who is in agreement. Between 1535 and 1605, patient was also able to walk around her room independently without syncope/presyncope. I also discussed with CT surgeon Dr. Donata Clay (in Dr. Dennie Maizes absence). He advised that if she was on a b-blocker then to hold dose tonight to avoid hypotension; however, patient is not on a b-blocker. She was advised to contact on-call cardiology, present to ED, or to contact EMS (depending on the severity of any symptoms) if she has recurrent syncope, new CV symptoms, or just  feels poorly. She was taken to the hospital entrance at 1605 via wheelchair.     EKG 04/29/16: NSR, LAD, LVH with QRS widening and early repolarization. Baseline wanderer in V1-3.  Cardiac cath 04/05/16: Conclusion  1. Severe aortic valve regurgitation 2. Widely patent coronary arteries with minimal irregularity of the LAD at the origin of the first diagonal 3. Normal right heart hemodynamics   Echo 02/08/16: Study Conclusions - Left ventricle: The cavity size was moderately dilated. Wall   thickness was normal. Systolic  function was mildly to moderately   reduced. The estimated ejection fraction was in the range of 40%   to 45%. Mild diffuse hypokinesis with no identifiable regional   variations. Features are consistent with a pseudonormal left   ventricular filling pattern, with concomitant abnormal relaxation   and increased filling pressure (grade 2 diastolic dysfunction). - Aortic valve: Possibly bicuspid; mildly thickened, mildly   calcified leaflets. There was very mild stenosis. There was   severe regurgitation directed eccentrically in the LVOT and   towards the mitral anterior leaflet. - Mitral valve: There was mild regurgitation. - Left atrium: The atrium was mildly dilated. - Atrial septum: No defect or patent foramen ovale was identified.  24 hour Holter monitor 02/08/16: The basic rhythm is sinus. There are frequent PVCs, periods of ventricular bigeminy, and occasional ventricular couplets and triplets.  Carotid U/S 04/29/16: Summary: There is no obvious evidence of hemodynamically significant internal carotid artery stenosis bilaterally. Vertebral arteries are patent with antegrade flow.   CTA Chest 03/24/16: IMPRESSION: 1. Normal CT appearance of the thoracic aorta and upper abdominal aorta. No aneurysm or dissection. 2. 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 3. Small pulmonary nodules as discussed above. No follow-up needed if patient is low-risk (and has no known or suspected primary neoplasm). Non-contrast chest CT can be considered in 12 months if patient is high-risk. This recommendation follows the consensus statement: Guidelines for Management of Incidental Pulmonary Nodules Detected on CT Images: From the Fleischner Society 2017; Radiology 2017; 284:228-243.  CXR 04/29/16: IMPRESSION: No active cardiopulmonary disease.  PFTs 04/29/16: FVC 2.75 (86%), FEV1 2.08 (84%), DLCOunc 15.64 (64%).   Preoperative labs  noted. A1c in process.   If no acute changes then I anticipate that she can proceed as planned. She had syncope with hypotension and bradycardia during attempted ABG draw. Advised she discuss with her anesthesia team timing of ABG on the day of surgery in hopes to avoid recurrent episode.  Velna Ochs [facility] Short Stay Center/Anesthesiology Phone [phone] 04/29/2016 4:42 PM

## 2016-04-30 LAB — HEMOGLOBIN A1C
Hgb A1c MFr Bld: 5.4 % (ref 4.8–5.6)
Mean Plasma Glucose: 108 mg/dL

## 2016-05-02 MED ORDER — TRANEXAMIC ACID 1000 MG/10ML IV SOLN
1.5000 mg/kg/h | INTRAVENOUS | Status: AC
  Administered 2016-05-03: 1.5 mg/kg/h via INTRAVENOUS
  Filled 2016-05-02: qty 25

## 2016-05-02 MED ORDER — CHLORHEXIDINE GLUCONATE 0.12 % MT SOLN: 15.0000 mL | Freq: Once | OROMUCOSAL | Status: DC

## 2016-05-02 MED ORDER — POTASSIUM CHLORIDE 2 MEQ/ML IV SOLN
80.0000 meq | INTRAVENOUS | Status: DC
  Filled 2016-05-02: qty 40

## 2016-05-02 MED ORDER — NITROGLYCERIN IN D5W 200-5 MCG/ML-% IV SOLN
2.0000 ug/min | INTRAVENOUS | Status: AC
  Administered 2016-05-03: 5 ug/min via INTRAVENOUS
  Filled 2016-05-02: qty 250

## 2016-05-02 MED ORDER — TRANEXAMIC ACID (OHS) BOLUS VIA INFUSION
15.0000 mg/kg | INTRAVENOUS | Status: AC
  Administered 2016-05-03: 973.5 mg via INTRAVENOUS
  Filled 2016-05-02: qty 974

## 2016-05-02 MED ORDER — MAGNESIUM SULFATE 50 % IJ SOLN
40.0000 meq | INTRAMUSCULAR | Status: DC
  Filled 2016-05-02: qty 10

## 2016-05-02 MED ORDER — SODIUM CHLORIDE 0.9 % IV SOLN
INTRAVENOUS | Status: DC
  Filled 2016-05-02: qty 30

## 2016-05-02 MED ORDER — METOPROLOL TARTRATE 12.5 MG HALF TABLET: 12.5000 mg | ORAL_TABLET | Freq: Once | ORAL | Status: DC

## 2016-05-02 MED ORDER — DEXTROSE 5 % IV SOLN
1.5000 g | INTRAVENOUS | Status: AC
  Administered 2016-05-03: 1.5 g via INTRAVENOUS
  Administered 2016-05-03: .75 g via INTRAVENOUS
  Filled 2016-05-02: qty 1.5

## 2016-05-02 MED ORDER — SODIUM CHLORIDE 0.9 % IV SOLN
INTRAVENOUS | Status: AC
  Administered 2016-05-03: 1 [IU]/h via INTRAVENOUS
  Filled 2016-05-02: qty 2.5

## 2016-05-02 MED ORDER — PLASMA-LYTE 148 IV SOLN
INTRAVENOUS | Status: DC
  Filled 2016-05-02: qty 2.5

## 2016-05-02 MED ORDER — DEXTROSE 5 % IV SOLN
750.0000 mg | INTRAVENOUS | Status: DC
  Filled 2016-05-02: qty 750

## 2016-05-02 MED ORDER — DEXMEDETOMIDINE HCL IN NACL 400 MCG/100ML IV SOLN
0.1000 ug/kg/h | INTRAVENOUS | Status: AC
  Administered 2016-05-03: .2 ug/kg/h via INTRAVENOUS
  Filled 2016-05-02: qty 100

## 2016-05-02 MED ORDER — PHENYLEPHRINE HCL 10 MG/ML IJ SOLN
30.0000 ug/min | INTRAMUSCULAR | Status: AC
  Administered 2016-05-03: 25 ug/min via INTRAVENOUS
  Filled 2016-05-02: qty 2

## 2016-05-02 MED ORDER — DEXTROSE 5 % IV SOLN
0.0000 ug/min | INTRAVENOUS | Status: DC
  Filled 2016-05-02: qty 4

## 2016-05-02 MED ORDER — TRANEXAMIC ACID (OHS) PUMP PRIME SOLUTION
2.0000 mg/kg | INTRAVENOUS | Status: DC
  Filled 2016-05-02: qty 1.3

## 2016-05-02 MED ORDER — DOPAMINE-DEXTROSE 3.2-5 MG/ML-% IV SOLN
0.0000 ug/kg/min | INTRAVENOUS | Status: DC
  Filled 2016-05-02: qty 250

## 2016-05-02 MED ORDER — VANCOMYCIN HCL 10 G IV SOLR
1250.0000 mg | INTRAVENOUS | Status: AC
  Administered 2016-05-03: 1250 mg via INTRAVENOUS
  Filled 2016-05-02: qty 1250

## 2016-05-03 ENCOUNTER — Inpatient Hospital Stay (HOSPITAL_COMMUNITY): Payer: BC Managed Care – PPO

## 2016-05-03 ENCOUNTER — Inpatient Hospital Stay (HOSPITAL_COMMUNITY): Payer: BC Managed Care – PPO | Admitting: Anesthesiology

## 2016-05-03 ENCOUNTER — Inpatient Hospital Stay (HOSPITAL_COMMUNITY)
Admission: RE | Admit: 2016-05-03 | Discharge: 2016-05-10 | DRG: 219 | Disposition: A | Discharge status: Home / Self Care | Payer: BC Managed Care – PPO | Source: Ambulatory Visit | Attending: Cardiothoracic Surgery | Admitting: Cardiothoracic Surgery

## 2016-05-03 ENCOUNTER — Encounter (HOSPITAL_COMMUNITY): Payer: Self-pay | Admitting: Anesthesiology

## 2016-05-03 ENCOUNTER — Encounter (HOSPITAL_COMMUNITY)
Admission: RE | Disposition: A | Discharge status: Home / Self Care | Payer: Self-pay | Source: Ambulatory Visit | Attending: Cardiothoracic Surgery

## 2016-05-03 ENCOUNTER — Inpatient Hospital Stay (HOSPITAL_COMMUNITY): Payer: BC Managed Care – PPO | Admitting: Vascular Surgery

## 2016-05-03 DIAGNOSIS — I351 Nonrheumatic aortic (valve) insufficiency: Secondary | ICD-10-CM | POA: Diagnosis present

## 2016-05-03 DIAGNOSIS — F329 Major depressive disorder, single episode, unspecified: Secondary | ICD-10-CM | POA: Diagnosis present

## 2016-05-03 DIAGNOSIS — J90 Pleural effusion, not elsewhere classified: Secondary | ICD-10-CM

## 2016-05-03 DIAGNOSIS — J9811 Atelectasis: Secondary | ICD-10-CM | POA: Diagnosis not present

## 2016-05-03 DIAGNOSIS — Z794 Long term (current) use of insulin: Secondary | ICD-10-CM

## 2016-05-03 DIAGNOSIS — Q231 Congenital insufficiency of aortic valve: Secondary | ICD-10-CM | POA: Diagnosis present

## 2016-05-03 DIAGNOSIS — I110 Hypertensive heart disease with heart failure: Secondary | ICD-10-CM | POA: Diagnosis present

## 2016-05-03 DIAGNOSIS — I480 Paroxysmal atrial fibrillation: Secondary | ICD-10-CM | POA: Diagnosis not present

## 2016-05-03 DIAGNOSIS — D696 Thrombocytopenia, unspecified: Secondary | ICD-10-CM | POA: Diagnosis present

## 2016-05-03 DIAGNOSIS — G43909 Migraine, unspecified, not intractable, without status migrainosus: Secondary | ICD-10-CM | POA: Diagnosis present

## 2016-05-03 DIAGNOSIS — Z79899 Other long term (current) drug therapy: Secondary | ICD-10-CM | POA: Diagnosis not present

## 2016-05-03 DIAGNOSIS — I5041 Acute combined systolic (congestive) and diastolic (congestive) heart failure: Secondary | ICD-10-CM | POA: Diagnosis not present

## 2016-05-03 DIAGNOSIS — Z952 Presence of prosthetic heart valve: Secondary | ICD-10-CM | POA: Diagnosis not present

## 2016-05-03 DIAGNOSIS — J939 Pneumothorax, unspecified: Secondary | ICD-10-CM | POA: Diagnosis not present

## 2016-05-03 DIAGNOSIS — M1990 Unspecified osteoarthritis, unspecified site: Secondary | ICD-10-CM | POA: Diagnosis present

## 2016-05-03 DIAGNOSIS — I9789 Other postprocedural complications and disorders of the circulatory system, not elsewhere classified: Secondary | ICD-10-CM | POA: Diagnosis not present

## 2016-05-03 DIAGNOSIS — R110 Nausea: Secondary | ICD-10-CM | POA: Diagnosis not present

## 2016-05-03 DIAGNOSIS — Z9689 Presence of other specified functional implants: Secondary | ICD-10-CM

## 2016-05-03 DIAGNOSIS — Z87891 Personal history of nicotine dependence: Secondary | ICD-10-CM

## 2016-05-03 DIAGNOSIS — E876 Hypokalemia: Secondary | ICD-10-CM | POA: Diagnosis not present

## 2016-05-03 DIAGNOSIS — D62 Acute posthemorrhagic anemia: Secondary | ICD-10-CM | POA: Diagnosis not present

## 2016-05-03 DIAGNOSIS — F419 Anxiety disorder, unspecified: Secondary | ICD-10-CM | POA: Diagnosis present

## 2016-05-03 HISTORY — PX: AORTIC VALVE REPLACEMENT: SHX41

## 2016-05-03 HISTORY — PX: TEE WITHOUT CARDIOVERSION: SHX5443

## 2016-05-03 LAB — POCT I-STAT 3, ART BLOOD GAS (G3+)
Acid-Base Excess: 2 mmol/L (ref 0.0–2.0)
Acid-Base Excess: 5 mmol/L — ABNORMAL HIGH (ref 0.0–2.0)
Acid-base deficit: 3 mmol/L — ABNORMAL HIGH (ref 0.0–2.0)
Acid-base deficit: 4 mmol/L — ABNORMAL HIGH (ref 0.0–2.0)
Acid-base deficit: 5 mmol/L — ABNORMAL HIGH (ref 0.0–2.0)
Bicarbonate: 27.2 mmol/L (ref 20.0–28.0)
Bicarbonate: 28.8 mmol/L — ABNORMAL HIGH (ref 20.0–28.0)
Bicarbonate: 23.2 mmol/L (ref 20.0–28.0)
Bicarbonate: 17.8 mmol/L — ABNORMAL LOW (ref 20.0–28.0)
Bicarbonate: 20.8 mmol/L (ref 20.0–28.0)
O2 Saturation: 100 %
O2 Saturation: 100 %
O2 Saturation: 99 %
O2 Saturation: 99 %
O2 Saturation: 97 %
Patient temperature: 37.3
Patient temperature: 37
Patient temperature: 37.1
TCO2: 28 mmol/L (ref 0–100)
TCO2: 30 mmol/L (ref 0–100)
TCO2: 24 mmol/L (ref 0–100)
TCO2: 19 mmol/L (ref 0–100)
TCO2: 22 mmol/L (ref 0–100)
pCO2 arterial: 43.1 mmHg (ref 32.0–48.0)
pCO2 arterial: 38.1 mmHg (ref 32.0–48.0)
pCO2 arterial: 44 mmHg (ref 32.0–48.0)
pCO2 arterial: 23.2 mmHg — ABNORMAL LOW (ref 32.0–48.0)
pCO2 arterial: 40.9 mmHg (ref 32.0–48.0)
pH, Arterial: 7.407 (ref 7.350–7.450)
pH, Arterial: 7.486 — ABNORMAL HIGH (ref 7.350–7.450)
pH, Arterial: 7.331 — ABNORMAL LOW (ref 7.350–7.450)
pH, Arterial: 7.494 — ABNORMAL HIGH (ref 7.350–7.450)
pH, Arterial: 7.314 — ABNORMAL LOW (ref 7.350–7.450)
pO2, Arterial: 502 mmHg — ABNORMAL HIGH (ref 83.0–108.0)
pO2, Arterial: 516 mmHg — ABNORMAL HIGH (ref 83.0–108.0)
pO2, Arterial: 129 mmHg — ABNORMAL HIGH (ref 83.0–108.0)
pO2, Arterial: 124 mmHg — ABNORMAL HIGH (ref 83.0–108.0)
pO2, Arterial: 105 mmHg (ref 83.0–108.0)

## 2016-05-03 LAB — GLUCOSE, CAPILLARY
Glucose-Capillary: 122 mg/dL — ABNORMAL HIGH (ref 65–99)
Glucose-Capillary: 116 mg/dL — ABNORMAL HIGH (ref 65–99)
Glucose-Capillary: 110 mg/dL — ABNORMAL HIGH (ref 65–99)
Glucose-Capillary: 134 mg/dL — ABNORMAL HIGH (ref 65–99)
Glucose-Capillary: 131 mg/dL — ABNORMAL HIGH (ref 65–99)
Glucose-Capillary: 145 mg/dL — ABNORMAL HIGH (ref 65–99)
Glucose-Capillary: 128 mg/dL — ABNORMAL HIGH (ref 65–99)
Glucose-Capillary: 165 mg/dL — ABNORMAL HIGH (ref 65–99)
Glucose-Capillary: 163 mg/dL — ABNORMAL HIGH (ref 65–99)
Glucose-Capillary: 122 mg/dL — ABNORMAL HIGH (ref 65–99)

## 2016-05-03 LAB — PROTIME-INR
INR: 1.4
Prothrombin Time: 17.3 seconds — ABNORMAL HIGH (ref 11.4–15.2)

## 2016-05-03 LAB — POCT I-STAT, CHEM 8
BUN: 14 mg/dL (ref 6–20)
BUN: 13 mg/dL (ref 6–20)
BUN: 11 mg/dL (ref 6–20)
BUN: 12 mg/dL (ref 6–20)
BUN: 12 mg/dL (ref 6–20)
BUN: 11 mg/dL (ref 6–20)
Calcium, Ion: 1.23 mmol/L (ref 1.15–1.40)
Calcium, Ion: 1.22 mmol/L (ref 1.15–1.40)
Calcium, Ion: 0.89 mmol/L — CL (ref 1.15–1.40)
Calcium, Ion: 1.03 mmol/L — ABNORMAL LOW (ref 1.15–1.40)
Calcium, Ion: 1.06 mmol/L — ABNORMAL LOW (ref 1.15–1.40)
Calcium, Ion: 1.18 mmol/L (ref 1.15–1.40)
Chloride: 105 mmol/L (ref 101–111)
Chloride: 105 mmol/L (ref 101–111)
Chloride: 100 mmol/L — ABNORMAL LOW (ref 101–111)
Chloride: 106 mmol/L (ref 101–111)
Chloride: 105 mmol/L (ref 101–111)
Chloride: 107 mmol/L (ref 101–111)
Creatinine, Ser: 0.7 mg/dL (ref 0.44–1.00)
Creatinine, Ser: 0.6 mg/dL (ref 0.44–1.00)
Creatinine, Ser: 0.5 mg/dL (ref 0.44–1.00)
Creatinine, Ser: 0.6 mg/dL (ref 0.44–1.00)
Creatinine, Ser: 0.6 mg/dL (ref 0.44–1.00)
Creatinine, Ser: 0.8 mg/dL (ref 0.44–1.00)
Glucose, Bld: 92 mg/dL (ref 65–99)
Glucose, Bld: 97 mg/dL (ref 65–99)
Glucose, Bld: 85 mg/dL (ref 65–99)
Glucose, Bld: 127 mg/dL — ABNORMAL HIGH (ref 65–99)
Glucose, Bld: 114 mg/dL — ABNORMAL HIGH (ref 65–99)
Glucose, Bld: 139 mg/dL — ABNORMAL HIGH (ref 65–99)
HCT: 26 % — ABNORMAL LOW (ref 36.0–46.0)
HCT: 26 % — ABNORMAL LOW (ref 36.0–46.0)
HCT: 20 % — ABNORMAL LOW (ref 36.0–46.0)
HCT: 23 % — ABNORMAL LOW (ref 36.0–46.0)
HCT: 22 % — ABNORMAL LOW (ref 36.0–46.0)
HCT: 31 % — ABNORMAL LOW (ref 36.0–46.0)
Hemoglobin: 8.8 g/dL — ABNORMAL LOW (ref 12.0–15.0)
Hemoglobin: 8.8 g/dL — ABNORMAL LOW (ref 12.0–15.0)
Hemoglobin: 6.8 g/dL — CL (ref 12.0–15.0)
Hemoglobin: 7.8 g/dL — ABNORMAL LOW (ref 12.0–15.0)
Hemoglobin: 7.5 g/dL — ABNORMAL LOW (ref 12.0–15.0)
Hemoglobin: 10.5 g/dL — ABNORMAL LOW (ref 12.0–15.0)
Potassium: 4 mmol/L (ref 3.5–5.1)
Potassium: 4.1 mmol/L (ref 3.5–5.1)
Potassium: 4 mmol/L (ref 3.5–5.1)
Potassium: 5.5 mmol/L — ABNORMAL HIGH (ref 3.5–5.1)
Potassium: 4.4 mmol/L (ref 3.5–5.1)
Potassium: 4.5 mmol/L (ref 3.5–5.1)
Sodium: 141 mmol/L (ref 135–145)
Sodium: 140 mmol/L (ref 135–145)
Sodium: 140 mmol/L (ref 135–145)
Sodium: 139 mmol/L (ref 135–145)
Sodium: 141 mmol/L (ref 135–145)
Sodium: 139 mmol/L (ref 135–145)
TCO2: 28 mmol/L (ref 0–100)
TCO2: 29 mmol/L (ref 0–100)
TCO2: 31 mmol/L (ref 0–100)
TCO2: 28 mmol/L (ref 0–100)
TCO2: 28 mmol/L (ref 0–100)
TCO2: 21 mmol/L (ref 0–100)

## 2016-05-03 LAB — CBC
HCT: 35.9 % — ABNORMAL LOW (ref 36.0–46.0)
HCT: 33.2 % — ABNORMAL LOW (ref 36.0–46.0)
HEMOGLOBIN: 11.9 g/dL — AB (ref 12.0–15.0)
Hemoglobin: 11 g/dL — ABNORMAL LOW (ref 12.0–15.0)
MCH: 30 pg (ref 26.0–34.0)
MCH: 30 pg (ref 26.0–34.0)
MCHC: 33.1 g/dL (ref 30.0–36.0)
MCHC: 33.1 g/dL (ref 30.0–36.0)
MCV: 90.4 fL (ref 78.0–100.0)
MCV: 90.5 fL (ref 78.0–100.0)
Platelets: 150 10*3/uL (ref 150–400)
Platelets: 136 10*3/uL — ABNORMAL LOW (ref 150–400)
RBC: 3.97 MIL/uL (ref 3.87–5.11)
RBC: 3.67 MIL/uL — ABNORMAL LOW (ref 3.87–5.11)
RDW: 15.1 % (ref 11.5–15.5)
RDW: 15 % (ref 11.5–15.5)
WBC: 14.2 10*3/uL — AB (ref 4.0–10.5)
WBC: 14.1 10*3/uL — ABNORMAL HIGH (ref 4.0–10.5)

## 2016-05-03 LAB — CREATININE, SERUM
Creatinine, Ser: 0.9 mg/dL (ref 0.44–1.00)
GFR calc Af Amer: >60 mL/min (ref >60)
GFR calc non Af Amer: >60 mL/min (ref >60)

## 2016-05-03 LAB — POCT I-STAT 4, (NA,K, GLUC, HGB,HCT)
Glucose, Bld: 130 mg/dL — ABNORMAL HIGH (ref 65–99)
HCT: 33 % — ABNORMAL LOW (ref 36.0–46.0)
Hemoglobin: 11.2 g/dL — ABNORMAL LOW (ref 12.0–15.0)
Potassium: 3.9 mmol/L (ref 3.5–5.1)
Sodium: 140 mmol/L (ref 135–145)

## 2016-05-03 LAB — MAGNESIUM: Magnesium: 2.9 mg/dL — ABNORMAL HIGH (ref 1.7–2.4)

## 2016-05-03 LAB — COOXEMETRY PANEL
Carboxyhemoglobin: 1 % (ref 0.5–1.5)
Methemoglobin: 1.4 % (ref 0.0–1.5)
O2 Saturation: 71.4 %
Total hemoglobin: 11 g/dL — ABNORMAL LOW (ref 12.0–16.0)

## 2016-05-03 LAB — PLATELET COUNT: Platelets: 137 10*3/uL — ABNORMAL LOW (ref 150–400)

## 2016-05-03 LAB — APTT: APTT: 37 s — AB (ref 24–36)

## 2016-05-03 LAB — HEMOGLOBIN AND HEMATOCRIT, BLOOD
HCT: 24.1 % — ABNORMAL LOW (ref 36.0–46.0)
Hemoglobin: 8.1 g/dL — ABNORMAL LOW (ref 12.0–15.0)

## 2016-05-03 LAB — PREPARE RBC (CROSSMATCH)

## 2016-05-03 SURGERY — REPLACEMENT, AORTIC VALVE, OPEN
Anesthesia: General

## 2016-05-03 MED ORDER — PROGESTERONE MICRONIZED 100 MG PO CAPS
100.0000 mg | ORAL_CAPSULE | Freq: Every day | ORAL | Status: DC
  Administered 2016-05-04 – 2016-05-09 (×6): 100 mg via ORAL
  Filled 2016-05-03 (×6): qty 1

## 2016-05-03 MED ORDER — ACETAMINOPHEN 500 MG PO TABS
1000.0000 mg | ORAL_TABLET | Freq: Four times a day (QID) | ORAL | Status: AC
Start: 2016-05-04 — End: 2016-05-08
  Administered 2016-05-04 – 2016-05-08 (×13): 1000 mg via ORAL
  Filled 2016-05-03 (×14): qty 2

## 2016-05-03 MED ORDER — EPHEDRINE 5 MG/ML INJ
INTRAVENOUS | Status: AC
  Filled 2016-05-03: qty 10

## 2016-05-03 MED ORDER — ORAL CARE MOUTH RINSE
15.0000 mL | OROMUCOSAL | Status: DC
  Administered 2016-05-03 (×2): 15 mL via OROMUCOSAL

## 2016-05-03 MED ORDER — BISACODYL 10 MG RE SUPP: 10.0000 mg | Freq: Every day | RECTAL | Status: DC

## 2016-05-03 MED ORDER — LACTATED RINGERS IV SOLN: 500.0000 mL | Freq: Once | INTRAVENOUS | Status: DC | PRN

## 2016-05-03 MED ORDER — FENTANYL CITRATE (PF) 250 MCG/5ML IJ SOLN
INTRAMUSCULAR | Status: DC | PRN
  Administered 2016-05-03 (×2): 100 ug via INTRAVENOUS
  Administered 2016-05-03 (×3): 50 ug via INTRAVENOUS
  Administered 2016-05-03 (×3): 100 ug via INTRAVENOUS
  Administered 2016-05-03: 250 ug via INTRAVENOUS
  Administered 2016-05-03 (×3): 100 ug via INTRAVENOUS

## 2016-05-03 MED ORDER — SODIUM CHLORIDE 0.9 % IJ SOLN
OROMUCOSAL | Status: DC | PRN
  Administered 2016-05-03 (×3): 4 mL via TOPICAL

## 2016-05-03 MED ORDER — METOPROLOL TARTRATE 25 MG/10 ML ORAL SUSPENSION: 12.5000 mg | Freq: Two times a day (BID) | ORAL | Status: DC

## 2016-05-03 MED ORDER — INSULIN REGULAR BOLUS VIA INFUSION
0.0000 [IU] | Freq: Three times a day (TID) | INTRAVENOUS | Status: DC
  Filled 2016-05-03: qty 10

## 2016-05-03 MED ORDER — CALCIUM CHLORIDE 10 % IV SOLN
INTRAVENOUS | Status: DC | PRN
  Administered 2016-05-03: 200 mg via INTRAVENOUS
  Administered 2016-05-03: 100 mg via INTRAVENOUS

## 2016-05-03 MED ORDER — FAMOTIDINE IN NACL 20-0.9 MG/50ML-% IV SOLN
20.0000 mg | Freq: Two times a day (BID) | INTRAVENOUS | Status: AC
  Administered 2016-05-03: 20 mg via INTRAVENOUS

## 2016-05-03 MED ORDER — HEMOSTATIC AGENTS (NO CHARGE) OPTIME
TOPICAL | Status: DC | PRN
  Administered 2016-05-03 (×2): 1 via TOPICAL

## 2016-05-03 MED ORDER — PROTAMINE SULFATE 10 MG/ML IV SOLN
INTRAVENOUS | Status: AC
  Filled 2016-05-03: qty 25

## 2016-05-03 MED ORDER — EPHEDRINE SULFATE-NACL 50-0.9 MG/10ML-% IV SOSY
PREFILLED_SYRINGE | INTRAVENOUS | Status: DC | PRN
  Administered 2016-05-03 (×3): 5 mg via INTRAVENOUS

## 2016-05-03 MED ORDER — LACTATED RINGERS IV SOLN
INTRAVENOUS | Status: DC | PRN
  Administered 2016-05-03: 07:00:00 via INTRAVENOUS

## 2016-05-03 MED ORDER — METOPROLOL TARTRATE 5 MG/5ML IV SOLN
2.5000 mg | INTRAVENOUS | Status: DC | PRN
  Filled 2016-05-03: qty 5

## 2016-05-03 MED ORDER — METOPROLOL TARTRATE 12.5 MG HALF TABLET: 12.5000 mg | ORAL_TABLET | Freq: Two times a day (BID) | ORAL | Status: DC

## 2016-05-03 MED ORDER — SODIUM CHLORIDE 0.9 % IJ SOLN
INTRAMUSCULAR | Status: AC
  Filled 2016-05-03: qty 10

## 2016-05-03 MED ORDER — ACETAMINOPHEN 160 MG/5ML PO SOLN: 650.0000 mg | Freq: Once | ORAL | Status: AC

## 2016-05-03 MED ORDER — PROPOFOL 10 MG/ML IV BOLUS
INTRAVENOUS | Status: AC
  Filled 2016-05-03: qty 20

## 2016-05-03 MED ORDER — PHENYLEPHRINE HCL 10 MG/ML IJ SOLN
0.0000 ug/min | INTRAMUSCULAR | Status: DC
  Administered 2016-05-03: 15 ug/min via INTRAVENOUS
  Filled 2016-05-03 (×2): qty 2

## 2016-05-03 MED ORDER — VECURONIUM BROMIDE 10 MG IV SOLR
INTRAVENOUS | Status: AC
  Filled 2016-05-03: qty 10

## 2016-05-03 MED ORDER — MORPHINE SULFATE (PF) 2 MG/ML IV SOLN
2.0000 mg | INTRAVENOUS | Status: DC | PRN
  Administered 2016-05-03 – 2016-05-04 (×12): 2 mg via INTRAVENOUS
  Filled 2016-05-03: qty 1
  Filled 2016-05-03: qty 2
  Filled 2016-05-03 (×7): qty 1
  Filled 2016-05-03: qty 2

## 2016-05-03 MED ORDER — LACTATED RINGERS IV SOLN
INTRAVENOUS | Status: DC
  Administered 2016-05-03: 19:00:00 via INTRAVENOUS

## 2016-05-03 MED ORDER — MORPHINE SULFATE (PF) 2 MG/ML IV SOLN: 1.0000 mg | INTRAVENOUS | Status: AC | PRN

## 2016-05-03 MED ORDER — FENTANYL CITRATE (PF) 250 MCG/5ML IJ SOLN
INTRAMUSCULAR | Status: AC
  Filled 2016-05-03: qty 5

## 2016-05-03 MED ORDER — ONDANSETRON HCL 4 MG/2ML IJ SOLN
4.0000 mg | Freq: Four times a day (QID) | INTRAMUSCULAR | Status: DC | PRN
  Administered 2016-05-03 – 2016-05-09 (×10): 4 mg via INTRAVENOUS
  Filled 2016-05-03 (×11): qty 2

## 2016-05-03 MED ORDER — ASPIRIN EC 325 MG PO TBEC
325.0000 mg | DELAYED_RELEASE_TABLET | Freq: Every day | ORAL | Status: DC
  Administered 2016-05-05 – 2016-05-07 (×3): 325 mg via ORAL
  Filled 2016-05-03 (×3): qty 1

## 2016-05-03 MED ORDER — LACTATED RINGERS IV SOLN
INTRAVENOUS | Status: DC
  Administered 2016-05-03: 13:00:00 via INTRAVENOUS

## 2016-05-03 MED ORDER — DEXTROSE 5 % IV SOLN
1.5000 g | Freq: Two times a day (BID) | INTRAVENOUS | Status: AC
  Administered 2016-05-03 – 2016-05-05 (×4): 1.5 g via INTRAVENOUS
  Filled 2016-05-03 (×4): qty 1.5

## 2016-05-03 MED ORDER — CALCIUM CHLORIDE 10 % IV SOLN
INTRAVENOUS | Status: AC
  Filled 2016-05-03: qty 10

## 2016-05-03 MED ORDER — PROTAMINE SULFATE 10 MG/ML IV SOLN
INTRAVENOUS | Status: DC | PRN
  Administered 2016-05-03: 190 mg via INTRAVENOUS

## 2016-05-03 MED ORDER — ACETAMINOPHEN 650 MG RE SUPP
650.0000 mg | Freq: Once | RECTAL | Status: AC
  Administered 2016-05-03: 650 mg via RECTAL

## 2016-05-03 MED ORDER — DOCUSATE SODIUM 100 MG PO CAPS
200.0000 mg | ORAL_CAPSULE | Freq: Every day | ORAL | Status: DC
  Administered 2016-05-05 – 2016-05-10 (×6): 200 mg via ORAL
  Filled 2016-05-03 (×6): qty 2

## 2016-05-03 MED ORDER — ROCURONIUM BROMIDE 50 MG/5ML IV SOSY
PREFILLED_SYRINGE | INTRAVENOUS | Status: AC
  Filled 2016-05-03: qty 10

## 2016-05-03 MED ORDER — POTASSIUM CHLORIDE 2 MEQ/ML IV SOLN
30.0000 meq | Freq: Once | INTRAVENOUS | Status: AC
  Administered 2016-05-03: 30 meq via INTRAVENOUS
  Filled 2016-05-03: qty 15

## 2016-05-03 MED ORDER — PROPOFOL 10 MG/ML IV BOLUS
INTRAVENOUS | Status: DC | PRN
  Administered 2016-05-03: 100 mg via INTRAVENOUS

## 2016-05-03 MED ORDER — NITROGLYCERIN IN D5W 200-5 MCG/ML-% IV SOLN: 0.0000 ug/min | INTRAVENOUS | Status: DC

## 2016-05-03 MED ORDER — ROCURONIUM BROMIDE 10 MG/ML (PF) SYRINGE
PREFILLED_SYRINGE | INTRAVENOUS | Status: DC | PRN
  Administered 2016-05-03 (×2): 50 mg via INTRAVENOUS

## 2016-05-03 MED ORDER — CHLORHEXIDINE GLUCONATE 4 % EX LIQD: 30.0000 mL | CUTANEOUS | Status: DC

## 2016-05-03 MED ORDER — MIDAZOLAM HCL 10 MG/2ML IJ SOLN
INTRAMUSCULAR | Status: AC
  Filled 2016-05-03: qty 2

## 2016-05-03 MED ORDER — SODIUM CHLORIDE 0.9 % IV SOLN: INTRAVENOUS | Status: DC

## 2016-05-03 MED ORDER — ALBUMIN HUMAN 5 % IV SOLN
250.0000 mL | INTRAVENOUS | Status: AC | PRN
  Administered 2016-05-03 – 2016-05-04 (×4): 250 mL via INTRAVENOUS
  Filled 2016-05-03 (×2): qty 250

## 2016-05-03 MED ORDER — SODIUM CHLORIDE 0.9% FLUSH
3.0000 mL | INTRAVENOUS | Status: DC | PRN
  Administered 2016-05-06: 3 mL via INTRAVENOUS
  Filled 2016-05-03: qty 3

## 2016-05-03 MED ORDER — DEXMEDETOMIDINE HCL IN NACL 200 MCG/50ML IV SOLN
INTRAVENOUS | Status: AC
  Filled 2016-05-03: qty 50

## 2016-05-03 MED ORDER — ASPIRIN 81 MG PO CHEW: 324.0000 mg | CHEWABLE_TABLET | Freq: Every day | ORAL | Status: DC

## 2016-05-03 MED ORDER — 0.9 % SODIUM CHLORIDE (POUR BTL) OPTIME
TOPICAL | Status: DC | PRN
  Administered 2016-05-03: 1000 mL
  Administered 2016-05-03: 5000 mL

## 2016-05-03 MED ORDER — SODIUM CHLORIDE 0.9% FLUSH
3.0000 mL | Freq: Two times a day (BID) | INTRAVENOUS | Status: DC
  Administered 2016-05-04 – 2016-05-09 (×7): 3 mL via INTRAVENOUS

## 2016-05-03 MED ORDER — VECURONIUM BROMIDE 10 MG IV SOLR
INTRAVENOUS | Status: DC | PRN
  Administered 2016-05-03: 4 mg via INTRAVENOUS
  Administered 2016-05-03: 3 mg via INTRAVENOUS
  Administered 2016-05-03: 4 mg via INTRAVENOUS

## 2016-05-03 MED ORDER — TRAMADOL HCL 50 MG PO TABS
50.0000 mg | ORAL_TABLET | ORAL | Status: DC | PRN
  Administered 2016-05-06: 50 mg via ORAL
  Administered 2016-05-06: 100 mg via ORAL
  Administered 2016-05-06 (×2): 50 mg via ORAL
  Administered 2016-05-07 – 2016-05-10 (×12): 100 mg via ORAL
  Filled 2016-05-03: qty 2
  Filled 2016-05-03: qty 1
  Filled 2016-05-03 (×5): qty 2
  Filled 2016-05-03 (×2): qty 1
  Filled 2016-05-03 (×7): qty 2

## 2016-05-03 MED ORDER — MILRINONE LACTATE IN DEXTROSE 20-5 MG/100ML-% IV SOLN
0.2000 ug/kg/min | INTRAVENOUS | Status: DC
  Administered 2016-05-03: 0.3 ug/kg/min via INTRAVENOUS
  Administered 2016-05-04: 0.2 ug/kg/min via INTRAVENOUS
  Filled 2016-05-03: qty 100

## 2016-05-03 MED ORDER — MILRINONE LACTATE IN DEXTROSE 20-5 MG/100ML-% IV SOLN
0.1250 ug/kg/min | INTRAVENOUS | Status: AC
  Administered 2016-05-03: .3 ug/kg/min via INTRAVENOUS
  Filled 2016-05-03: qty 100

## 2016-05-03 MED ORDER — HEPARIN SODIUM (PORCINE) 1000 UNIT/ML IJ SOLN
INTRAMUSCULAR | Status: DC | PRN
  Administered 2016-05-03: 10000 [IU] via INTRAVENOUS
  Administered 2016-05-03: 19000 [IU] via INTRAVENOUS

## 2016-05-03 MED ORDER — GLYCOPYRROLATE 0.2 MG/ML IJ SOLN
INTRAMUSCULAR | Status: DC | PRN
  Administered 2016-05-03: 0.4 mg via INTRAVENOUS

## 2016-05-03 MED ORDER — ACETAMINOPHEN 160 MG/5ML PO SOLN: 1000.0000 mg | Freq: Four times a day (QID) | ORAL | Status: AC

## 2016-05-03 MED ORDER — SODIUM CHLORIDE 0.9 % IV SOLN: 10.0000 mL/h | Freq: Once | INTRAVENOUS | Status: DC

## 2016-05-03 MED ORDER — DEXMEDETOMIDINE HCL IN NACL 200 MCG/50ML IV SOLN: 0.0000 ug/kg/h | INTRAVENOUS | Status: DC

## 2016-05-03 MED ORDER — SODIUM CHLORIDE 0.9 % IV SOLN
INTRAVENOUS | Status: DC
  Administered 2016-05-03: 2.1 [IU]/h via INTRAVENOUS
  Filled 2016-05-03 (×2): qty 2.5

## 2016-05-03 MED ORDER — SODIUM CHLORIDE 0.9 % IV SOLN
250.0000 mL | INTRAVENOUS | Status: DC
  Administered 2016-05-04: 250 mL via INTRAVENOUS

## 2016-05-03 MED ORDER — CHLORHEXIDINE GLUCONATE 0.12 % MT SOLN
15.0000 mL | OROMUCOSAL | Status: AC
Start: 2016-05-03 — End: 2016-05-03
  Administered 2016-05-03: 15 mL via OROMUCOSAL

## 2016-05-03 MED ORDER — SODIUM CHLORIDE 0.45 % IV SOLN
INTRAVENOUS | Status: DC | PRN
  Administered 2016-05-03: 13:00:00 via INTRAVENOUS

## 2016-05-03 MED ORDER — MIDAZOLAM HCL 5 MG/5ML IJ SOLN
INTRAMUSCULAR | Status: DC | PRN
  Administered 2016-05-03: 2 mg via INTRAVENOUS
  Administered 2016-05-03: 4 mg via INTRAVENOUS
  Administered 2016-05-03 (×2): 2 mg via INTRAVENOUS

## 2016-05-03 MED ORDER — AMPHETAMINE-DEXTROAMPHETAMINE 10 MG PO TABS
20.0000 mg | ORAL_TABLET | Freq: Every day | ORAL | Status: DC
  Administered 2016-05-05: 20 mg via ORAL
  Filled 2016-05-03 (×6): qty 2

## 2016-05-03 MED ORDER — PANTOPRAZOLE SODIUM 40 MG PO TBEC
40.0000 mg | DELAYED_RELEASE_TABLET | Freq: Every day | ORAL | Status: DC
  Administered 2016-05-05 – 2016-05-10 (×6): 40 mg via ORAL
  Filled 2016-05-03 (×6): qty 1

## 2016-05-03 MED ORDER — FENTANYL CITRATE (PF) 250 MCG/5ML IJ SOLN
INTRAMUSCULAR | Status: AC
  Filled 2016-05-03: qty 20

## 2016-05-03 MED ORDER — MAGNESIUM SULFATE 4 GM/100ML IV SOLN
4.0000 g | Freq: Once | INTRAVENOUS | Status: AC
  Administered 2016-05-03: 4 g via INTRAVENOUS
  Filled 2016-05-03: qty 100

## 2016-05-03 MED ORDER — BISACODYL 5 MG PO TBEC
10.0000 mg | DELAYED_RELEASE_TABLET | Freq: Every day | ORAL | Status: DC
  Administered 2016-05-05 – 2016-05-10 (×5): 10 mg via ORAL
  Filled 2016-05-03 (×6): qty 2

## 2016-05-03 MED ORDER — ORAL CARE MOUTH RINSE: 15.0000 mL | Freq: Two times a day (BID) | OROMUCOSAL | Status: DC

## 2016-05-03 MED ORDER — CHLORHEXIDINE GLUCONATE 0.12% ORAL RINSE (MEDLINE KIT): 15.0000 mL | Freq: Two times a day (BID) | OROMUCOSAL | Status: DC

## 2016-05-03 MED ORDER — HEPARIN SODIUM (PORCINE) 1000 UNIT/ML IJ SOLN
INTRAMUSCULAR | Status: AC
  Filled 2016-05-03: qty 2

## 2016-05-03 MED ORDER — LORAZEPAM 0.5 MG PO TABS
0.5000 mg | ORAL_TABLET | Freq: Every day | ORAL | Status: DC | PRN
  Administered 2016-05-07 – 2016-05-09 (×4): 0.5 mg via ORAL
  Filled 2016-05-03 (×4): qty 1

## 2016-05-03 MED ORDER — VANCOMYCIN HCL IN DEXTROSE 1-5 GM/200ML-% IV SOLN
1000.0000 mg | Freq: Once | INTRAVENOUS | Status: AC
  Administered 2016-05-03: 1000 mg via INTRAVENOUS
  Filled 2016-05-03: qty 200

## 2016-05-03 MED ORDER — MIDAZOLAM HCL 2 MG/2ML IJ SOLN
2.0000 mg | INTRAMUSCULAR | Status: DC | PRN
  Administered 2016-05-03: 2 mg via INTRAVENOUS
  Filled 2016-05-03: qty 2

## 2016-05-03 MED FILL — Potassium Chloride Inj 2 mEq/ML: INTRAVENOUS | Qty: 40 | Status: AC

## 2016-05-03 MED FILL — Sodium Chloride IV Soln 0.9%: INTRAVENOUS | Qty: 2000 | Status: AC

## 2016-05-03 MED FILL — Heparin Sodium (Porcine) Inj 1000 Unit/ML: INTRAMUSCULAR | Qty: 30 | Status: AC

## 2016-05-03 MED FILL — Mannitol IV Soln 20%: INTRAVENOUS | Qty: 500 | Status: AC

## 2016-05-03 MED FILL — Lidocaine HCl IV Inj 20 MG/ML: INTRAVENOUS | Qty: 5 | Status: AC

## 2016-05-03 MED FILL — Magnesium Sulfate Inj 50%: INTRAMUSCULAR | Qty: 10 | Status: AC

## 2016-05-03 MED FILL — Heparin Sodium (Porcine) Inj 1000 Unit/ML: INTRAMUSCULAR | Qty: 10 | Status: AC

## 2016-05-03 MED FILL — Sodium Bicarbonate IV Soln 8.4%: INTRAVENOUS | Qty: 50 | Status: AC

## 2016-05-03 MED FILL — Electrolyte-R (PH 7.4) Solution: INTRAVENOUS | Qty: 3000 | Status: AC

## 2016-05-03 SURGICAL SUPPLY — 78 items
ADAPTER CARDIO PERF ANTE/RETRO (ADAPTER) ×2 IMPLANT
ADPR PRFSN 84XANTGRD RTRGD (ADAPTER) ×1
AGENT HMST KT MTR STRL THRMB (HEMOSTASIS) ×1
APL SKNCLS STERI-STRIP NONHPOA (GAUZE/BANDAGES/DRESSINGS) ×1
APPLICATOR COTTON TIP 6IN STRL (MISCELLANEOUS) IMPLANT
BAG DECANTER FOR FLEXI CONT (MISCELLANEOUS) ×1 IMPLANT
BENZOIN TINCTURE PRP APPL 2/3 (GAUZE/BANDAGES/DRESSINGS) ×1 IMPLANT
BIOPATCH RED 1 DISK 7.0 (GAUZE/BANDAGES/DRESSINGS) ×1 IMPLANT
BLADE STERNUM SYSTEM 6 (BLADE) ×2 IMPLANT
BLADE SURG 15 STRL LF DISP TIS (BLADE) ×1 IMPLANT
BLADE SURG 15 STRL SS (BLADE) ×2
BOOT SUTURE AID YELLOW STND (SUTURE) IMPLANT
CANISTER SUCTION 2500CC (MISCELLANEOUS) ×2 IMPLANT
CANNULA GUNDRY RCSP 15FR (MISCELLANEOUS) ×2 IMPLANT
CANNULA VEN 2 STAGE (MISCELLANEOUS) ×1 IMPLANT
CATH CPB KIT GERHARDT (MISCELLANEOUS) ×2 IMPLANT
CATH HEART VENT LEFT (CATHETERS) ×1 IMPLANT
CATH THORACIC 28FR (CATHETERS) ×2 IMPLANT
CATH/SQUID NICHOLS JEHLE COR (CATHETERS) ×1 IMPLANT
CHLORAPREP W/TINT 10.5 ML (MISCELLANEOUS) ×1 IMPLANT
CONN ST 1/4X3/8  BEN (MISCELLANEOUS) ×2
CONN ST 1/4X3/8 BEN (MISCELLANEOUS) IMPLANT
CONT SPEC 4OZ CLIKSEAL STRL BL (MISCELLANEOUS) ×1 IMPLANT
CRADLE DONUT ADULT HEAD (MISCELLANEOUS) ×2 IMPLANT
DRAIN CHANNEL 28F RND 3/8 FF (WOUND CARE) ×3 IMPLANT
DRAPE CARDIOVASCULAR INCISE (DRAPES) ×2
DRAPE SLUSH/WARMER DISC (DRAPES) ×1 IMPLANT
DRAPE SRG 135X102X78XABS (DRAPES) ×1 IMPLANT
DRSG AQUACEL AG ADV 3.5X14 (GAUZE/BANDAGES/DRESSINGS) ×2 IMPLANT
ELECT CAUTERY BLADE 6.4 (BLADE) ×2 IMPLANT
ELECT REM PT RETURN 9FT ADLT (ELECTROSURGICAL) ×4
ELECTRODE REM PT RTRN 9FT ADLT (ELECTROSURGICAL) ×2 IMPLANT
FELT TEFLON 1X6 (MISCELLANEOUS) ×3 IMPLANT
GAUZE SPONGE 4X4 12PLY STRL (GAUZE/BANDAGES/DRESSINGS) ×2 IMPLANT
GLOVE BIO SURGEON STRL SZ 6.5 (GLOVE) ×5 IMPLANT
GLOVE BIOGEL M 6.5 STRL (GLOVE) ×5 IMPLANT
GLOVE BIOGEL PI IND STRL 6 (GLOVE) IMPLANT
GLOVE BIOGEL PI IND STRL 6.5 (GLOVE) IMPLANT
GLOVE BIOGEL PI INDICATOR 6 (GLOVE) ×3
GLOVE BIOGEL PI INDICATOR 6.5 (GLOVE) ×3
GOWN STRL REUS W/ TWL LRG LVL3 (GOWN DISPOSABLE) ×4 IMPLANT
GOWN STRL REUS W/TWL LRG LVL3 (GOWN DISPOSABLE) ×12
HEMOSTAT POWDER SURGIFOAM 1G (HEMOSTASIS) ×6 IMPLANT
HEMOSTAT SURGICEL 2X14 (HEMOSTASIS) ×2 IMPLANT
INSERT FOGARTY XLG (MISCELLANEOUS) IMPLANT
KIT BASIN OR (CUSTOM PROCEDURE TRAY) ×2 IMPLANT
KIT ROOM TURNOVER OR (KITS) ×2 IMPLANT
KIT SUCTION CATH 14FR (SUCTIONS) ×4 IMPLANT
LEAD PACING MYOCARDI (MISCELLANEOUS) ×2 IMPLANT
LINE VENT (MISCELLANEOUS) ×1 IMPLANT
NS IRRIG 1000ML POUR BTL (IV SOLUTION) ×10 IMPLANT
PACK OPEN HEART (CUSTOM PROCEDURE TRAY) ×2 IMPLANT
PAD ARMBOARD 7.5X6 YLW CONV (MISCELLANEOUS) ×4 IMPLANT
SET CARDIOPLEGIA MPS 5001102 (MISCELLANEOUS) ×1 IMPLANT
SPONGE GAUZE 4X4 12PLY STER LF (GAUZE/BANDAGES/DRESSINGS) ×1 IMPLANT
SURGIFLO W/THROMBIN 8M KIT (HEMOSTASIS) ×1 IMPLANT
SUT BONE WAX W31G (SUTURE) ×2 IMPLANT
SUT ETHIBON 2 0 V 52N 30 (SUTURE) ×4 IMPLANT
SUT PROLENE 3 0 RB 1 (SUTURE) ×2 IMPLANT
SUT PROLENE 3 0 SH1 36 (SUTURE) ×2 IMPLANT
SUT PROLENE 4 0 RB 1 (SUTURE) ×8
SUT PROLENE 4-0 RB1 .5 CRCL 36 (SUTURE) IMPLANT
SUT PROLENE 6 0 C 1 30 (SUTURE) ×2 IMPLANT
SUT STEEL 6MS V (SUTURE) ×2 IMPLANT
SUT STEEL SZ 6 DBL 3X14 BALL (SUTURE) ×2 IMPLANT
SUT VIC AB 1 CTX 18 (SUTURE) ×4 IMPLANT
SUTURE E-PAK OPEN HEART (SUTURE) ×2 IMPLANT
SYSTEM SAHARA CHEST DRAIN ATS (WOUND CARE) ×2 IMPLANT
TAPE CLOTH SURG 4X10 WHT LF (GAUZE/BANDAGES/DRESSINGS) ×1 IMPLANT
TOWEL GREEN STERILE (TOWEL DISPOSABLE) ×5 IMPLANT
TOWEL GREEN STERILE FF (TOWEL DISPOSABLE) ×3 IMPLANT
TOWEL OR 17X24 6PK STRL BLUE (TOWEL DISPOSABLE) ×2 IMPLANT
TOWEL OR 17X26 10 PK STRL BLUE (TOWEL DISPOSABLE) ×2 IMPLANT
TRAY FOLEY IC TEMP SENS 16FR (CATHETERS) ×2 IMPLANT
UNDERPAD 30X30 (UNDERPADS AND DIAPERS) ×2 IMPLANT
VALVE MAGNA EASE 21MM (Prosthesis & Implant Heart) ×1 IMPLANT
VENT LEFT HEART 12002 (CATHETERS) ×2
WATER STERILE IRR 1000ML POUR (IV SOLUTION) ×4 IMPLANT

## 2016-05-03 NOTE — Anesthesia Procedure Notes (Signed)
Procedure Name: Intubation Date/Time: 05/03/2016 7:44 AM Performed by: Kyung Rudd Pre-anesthesia Checklist: Patient identified, Emergency Drugs available, Suction available and Patient being monitored Patient Re-evaluated:Patient Re-evaluated prior to inductionOxygen Delivery Method: Circle system utilized Preoxygenation: Pre-oxygenation with 100% oxygen Intubation Type: IV induction Ventilation: Mask ventilation without difficulty Laryngoscope Size: Mac and 3 Grade View: Grade II Tube type: Oral Tube size: 7.5 mm Number of attempts: 1 Airway Equipment and Method: Stylet Placement Confirmation: ETT inserted through vocal cords under direct vision,  positive ETCO2 and breath sounds checked- equal and bilateral Secured at: 21 cm Tube secured with: Tape Dental Injury: Teeth and Oropharynx as per pre-operative assessment

## 2016-05-03 NOTE — Op Note (Signed)
NAMEMARGARY, ATTALLA NO.:  [id number]  MEDICAL RECORD NO.:  [id number]  LOCATION:  [facility]                         FACILITY:  [facility]  PHYSICIAN:  Sheliah Plane, MD    DATE OF BIRTH:  21-Jan-1951  DATE OF PROCEDURE:  05/03/2016 DATE OF DISCHARGE:                              OPERATIVE REPORT   PREOPERATIVE DIAGNOSIS:  Bicuspid aortic valve with severe aortic insufficiency.  POSTOPERATIVE DIAGNOSIS:  Bicuspid aortic valve with severe aortic insufficiency.  SURGICAL PROCEDURE:  Aortic valve replacement with pericardial tissue valve [employer], 21 mm model 3300TFX, serial [id number].  SURGEON:  Sheliah Plane, MD  FIRST ASSISTANT:  Lin Landsman, PA.  BRIEF HISTORY:  The patient is a 66 year old female, who had a known bicuspid valve without significant consequences until recently.  She has a family history of bicuspid aortic valve and dilated ascending aorta. Her brother had a replacement of ascending aorta and aortic valve 10-12 years ago.  Recently, in late 2017, the patient had noted increasing symptoms of shortness of breath with minimal exertion, which is a significant change in her overall physical ability.  She saw Dr. Excell Seltzer. Echocardiogram confirmed mild LV dysfunction with severe aortic insufficiency.  Because of her increasing symptoms and degree of insufficiency, cardiac catheterization was performed, which showed no significant coronary artery disease.  A CT scan of the chest was obtained for accurate measurement of her ascending aorta, which was not dilated.  The patient was referred for replacement of her aortic valve. After careful discussion with her and her husband concerning the risks and options of aortic valve replacement, she was willing to proceed.  We discussed in detail the pros and cons of mechanical versus tissue valve. She did not wish to have a mechanical valve.  She agreed to proceeding and signed informed  consent.  DESCRIPTION OF PROCEDURE:  With Swan-Ganz and arterial line monitors in place, the patient underwent general endotracheal anesthesia without incident.  Skin of the chest and legs was prepped with Betadine and draped in usual sterile manner.  Appropriate time-out was performed and we proceeded with a median sternotomy.  The pericardium was then opened. Dr. Chilton Si had placed the TEE probe, confirming the preop diagnosis of aortic insufficiency and a bicuspid aortic valve.  The patient was systemically heparinized.  Ascending aorta was cannulated.  The right atrium was cannulated.  Retrograde cardioplegic catheter was placed and an aortic root vent cardioplegia needle was introduced into the ascending aorta.  The patient was placed on cardiopulmonary bypass 2.4 L/min/m2.  The patient's body temperature was cooled to 32 degrees.  A right superior pulmonary vein vent was also placed.  Aortic cross-clamp was applied and cold blood cardioplegia was administered.  Briefly until we obtained cardiac arrest and then additional 800 mL of retrograde cold blood cardioplegia was administered.  We then proceeded with a transverse aortotomy, which gave good exposure of a bicuspid aortic valve with a residual raphe between the right and left coronary cusp. The bicuspid valve was oriented with the anterior posteriorly.  The coronary ostium was free of disease.  Additional cold blood cardioplegia was administered directly down both coronary ostia.  The  aortic valve was then carefully excised.  A #2 Tycron pledgeted sutures were placed circumferentially around the anulus with the pledgets on the ventricular surface.  With the pledgets in place, the valve was sized for a 21 pericardial tissue valve.  An [employer] pericardial tissue valve, model 3300TFX, 21 mm, serial [id number] was selected.  The pledgeted sutures were used to secure the valve in place, a total of 14. The valve seated well  without impingement on the right or left coronary ostium.  The aortotomy was then closed with a horizontal mattress 4-0 Prolene suture over felt strips.  As we closed the aorta, additional warm cardioplegia was administered and the heart was allowed to passively fill and de-air.  Aortotomy was completed and aortic crossclamp was removed.  The patient's body temperature was rewarmed to 37 degrees.  She required electric defibrillation and returned to a sinus rhythm.  At this point, the CO2 that had been infused into the pericardial space was turned off with the patient rewarmed to 37 degrees and the TEE looking with just trace mitral regurgitation and well- functioning aortic valve prosthesis.  The patient was allowed to fill, ventilate, and was weaned from cardiopulmonary bypass without difficulty.  Aortic root vent was removed.  The vent had been removed prior to coming off.  The patient was decannulated in usual fashion. She remained hemodynamically stable.  Protamine sulfate was administered.  Atrial and ventricular pacing wires had been applied. The patient remained hemodynamically stable with operative field hemostatic.  Two mediastinal drains were left in place.  One inferior and one substernal.  The pericardium was loosely reapproximated.  The sternum was closed with #6 stainless steel wire.  Fascia was closed with interrupted 0 Vicryl, running 3-0 Vicryl in subcutaneous tissue, and 4-0 subcuticular stitch in skin edges.  Dry dressings were applied.  Sponge and needle counts were reported as correct after completion of the procedure.  The patient tolerated the procedure without obvious complication and was transferred to the surgical intensive care unit. She did require 1 unit of packed red blood cells because of low hematocrit below 20 while on pump.  Total crossclamp was 73 minutes.     Sheliah Plane, MD     EG/MEDQ  D:  05/03/2016  T:  05/03/2016  Job:  [id number]

## 2016-05-03 NOTE — H&P (Signed)
[address]       [place] [address]             [phone]                    Wanda [name] [facility] Medical Record #[id number] Date of Birth: 03-Jul-1950  Referring: Dr Excell Seltzer Primary Care: Betty Swazil[place], MD  Chief Complaint:    Leaking  Heart valve   History of Present Illness:    Wanda [name] 66 y.o. female is seen in the office   for Evaluation of aortic insufficiency. The patient was seen 01/18/2016 by cardiology because of  an abnormal EKG and newly diagnosed heart murmur. She was noted to have ventricular bigeminy on EKG. She had a heart murmur suggestive of aortic insufficiency and an echocardiogram was done. The echocardiogram demonstrated severe aortic insufficiency, dilated left ventricle, and mild to moderate LV systolic dysfunction with LVEF 40-45%. Holter monitor demonstrated frequent PVCs and periods of ventricular bigeminy. Heart catheterization has not been done  She has shortness of breath with climbing stairs. She is able to participating low level exercise without any exertional symptoms. She has not had heart palpitations, chest pain, chest pressure, lightheadedness, or syncope. She denies orthopnea, PND, or leg swelling.   Patient's family history is significant for her brother [name] date of birth 09/26/1940 who had a history of pulmonary embolus and DVT in his late 77s. In May 2004 he underwent aortic valve replacement with a 25 mechanical St. Jude valve and ligation of his left atrial appendage for a critically stenotic bicuspid aortic valve.  In 2080 presented with advanced stage lung cancer which he ultimately succumbed to. I do not have specific records concerning the brothers hypercoagulable state but he had been started on lifelong Coumadin by his hematologist prior to valve replacement.   Current Activity/ Functional Status:  Patient is independent with mobility/ambulation, transfers, ADL's, IADL's.   Zubrod Score: At  the time of surgery this patient's most appropriate activity status/level should be described as: []     0    Normal activity, no symptoms [x]     1    Restricted in physical strenuous activity but ambulatory, able to do out light work []     2    Ambulatory and capable of self care, unable to do work activities, up and about               >50 % of waking hours                              []     3    Only limited self care, in bed greater than 50% of waking hours []     4    Completely disabled, no self care, confined to bed or chair []     5    Moribund   Past Medical History:  Diagnosis Date  . Anxiety   . Aortic regurgitation   . Aortic stenosis   . Arthritis   . Depression   . Dyspnea   . Migraine     Past Surgical History:  Procedure Laterality Date  . TONSILLECTOMY AND ADENOIDECTOMY      Family History  Problem Relation Age of Onset  . Diabetes Mother   . Ovarian cancer Maternal Grandmother 80  . Breast cancer Sister 48  . Congestive Heart Failure Maternal Grandfather   .  Heart disease Brother     valve replacement  . Deep vein thrombosis Father   . Deep vein thrombosis Brother   . Deep vein thrombosis Paternal Aunt     x2  . Deep vein thrombosis Paternal Grandmother   . Lung cancer Brother     Social History   Social History  . Marital status: Married    Spouse name: N/A  . Number of children: N/A  . Years of education: N/A   Occupational History  . Not on file.   Social History Main Topics  . Smoking status: Former Games developer  . Smokeless tobacco: Never Used     Comment: in college  . Alcohol use Yes     Comment: 1-2 glasses of wine  . Drug use: No  . Sexual activity: Yes    Partners: Male    Birth control/ protection: Other-see comments, Post-menopausal     Comment: vasectomy      History  Smoking Status  . Former Smoker  Smokeless Tobacco  . Never Used    Comment: in college    History  Alcohol Use  . Yes    Comment: 1-2 glasses of wine      Allergies  Allergen Reactions  . Codeine Nausea And Vomiting  . Vicodin [Hydrocodone-Acetaminophen] Nausea And Vomiting    Current Facility-Administered Medications  Medication Dose Route Frequency Provider Last Rate Last Dose  . cefUROXime (ZINACEF) 1.5 g in dextrose 5 % 50 mL IVPB  1.5 g Intravenous To OR [name], MD      . cefUROXime (ZINACEF) 750 mg in dextrose 5 % 50 mL IVPB  750 mg Intravenous To OR [name], MD      . chlorhexidine (PERIDEX) 0.12 % solution 15 mL  15 mL Mouth/Throat Once [name], MD      . dexmedetomidine (PRECEDEX) 400 MCG/100ML (4 mcg/mL) infusion  0.1-0.7 mcg/kg/hr Intravenous To OR [name], MD      . DOPamine (INTROPIN) 800 mg in dextrose 5 % 250 mL (3.2 mg/mL) infusion  0-10 mcg/kg/min Intravenous To OR [name], MD      . EPINEPHrine (ADRENALIN) 4 mg in dextrose 5 % 250 mL (0.016 mg/mL) infusion  0-10 mcg/min Intravenous To OR [name], MD      . heparin 2,500 Units, papaverine 30 mg in electrolyte-148 (PLASMALYTE-148) 500 mL irrigation   Irrigation To OR [name], MD      . heparin 30,000 units/NS 1000 mL solution for CELLSAVER   Other To OR [name], MD      . insulin regular (NOVOLIN R,HUMULIN R) 250 Units in sodium chloride 0.9 % 250 mL (1 Units/mL) infusion   Intravenous To OR [name], MD      . magnesium sulfate (IV Push/IM) injection 40 mEq  40 mEq Other To OR [name], MD      . metoprolol tartrate (LOPRESSOR) tablet 12.5 mg  12.5 mg Oral Once [name], MD      . nitroGLYCERIN 50 mg in dextrose 5 % 250 mL (0.2 mg/mL) infusion  2-200 mcg/min Intravenous To OR [name], MD      . phenylephrine (NEO-SYNEPHRINE) 20 mg in sodium chloride 0.9 % 250 mL (0.08 mg/mL) infusion  30-200 mcg/min Intravenous To OR [name], MD      . potassium chloride injection 80 mEq  80 mEq Other To OR [name], MD      .  tranexamic acid  (CYKLOKAPRON) 2,500 mg in sodium chloride 0.9 % 250 mL (10 mg/mL) infusion  1.5 mg/kg/hr Intravenous To OR [name], MD      . tranexamic acid (CYKLOKAPRON) bolus via infusion - over 30 minutes 973.5 mg  15 mg/kg Intravenous To OR [name], MD      . tranexamic acid (CYKLOKAPRON) pump prime solution 130 mg  2 mg/kg Intracatheter To OR [name], MD      . vancomycin (VANCOCIN) 1,250 mg in sodium chloride 0.9 % 250 mL IVPB  1,250 mg Intravenous To OR [name], MD       Facility-Administered Medications Ordered in Other Encounters  Medication Dose Route Frequency Provider Last Rate Last Dose  . fentaNYL (SUBLIMAZE) injection   Intravenous Anesthesia Intra-op Quentin Ore, CRNA   50 mcg at 05/03/16 [phone]  . midazolam (VERSED) 5 MG/5ML injection    Anesthesia Intra-op Quentin Ore, CRNA   2 mg at 05/03/16 3664      Review of Systems:     Cardiac Review of Systems: Y or N  Chest Pain [ n   ]  Resting SOB [n   ] Exertional SOB  Cove.Etienne  ]  Orthopnea [  n]   Pedal Edema [ n  ]    Palpitations Cove.Etienne  ] Syncope  [n  ]   Presyncope [ y  ]  General Review of Systems: [Y] = yes [  ]=no Constitional: recent weight change [ n ];  Wt loss over the last 3 months [   ] anorexia [  ]; fatigue [  ]; nausea [  ]; night sweats [  ]; fever [  ]; or chills [  ];          Dental: poor dentition[ n ]; Last Dentist visit: 3 months ago  Eye : blurred vision [  ]; diplopia [   ]; vision changes [  ];  Amaurosis fugax[  ]; Resp: cough [ n ];  wheezing[n  ];  hemoptysis[  ]; shortness of breath[  ]; paroxysmal nocturnal dyspnea[  ]; dyspnea on exertion[y  ]; or orthopnea[n  ];  GI:  gallstones[  ], vomiting[  ];  dysphagia[  ]; melena[  ];  hematochezia [  ]; heartburn[  ];   Hx of  Colonoscopy[  ]; GU: kidney stones [  ]; hematuria[  ];   dysuria [  ];  nocturia[  ];  history of     obstruction [  ]; urinary frequency [  ]             Skin: rash, swelling[  ];, hair loss[  ];  peripheral edema[   ];  or itching[  ]; Musculosketetal: myalgias[ n ];  joint swelling[  ];  joint erythema[n  ];  joint pain[  ];  back pain[  ];  Heme/Lymph: bruising[n  ];  bleeding[ n ];  anemia[n  ];  Neuro: TIA[ n ];  headaches[y  ];  stroke[ n ];  vertigo[ n ];  seizures[n  ];   paresthesias[  ];  difficulty walking[  n];  Psych:depression[  ]; anxiety[y  ];  Endocrine: diabetes[ n ];  thyroid dysfunction[ n ];  Immunizations: Flu up to date [  ]; Pneumococcal up to date [  ];  Other:  Physical Exam: BP (!) 147/51   Pulse 73   Temp 98.9 F (37.2 C) (Oral)   Resp 18  LMP 08/12/2004   SpO2 98%   PHYSICAL EXAMINATION: General appearance: alert, cooperative, appears stated age, distracted and no distress Head: Normocephalic, without obvious abnormality, atraumatic Neck: no adenopathy, no carotid bruit, no JVD, supple, symmetrical, trachea midline and thyroid not enlarged, symmetric, no tenderness/mass/nodules Lymph nodes: Cervical, supraclavicular, and axillary nodes normal. Resp: clear to auscultation bilaterally Back: symmetric, no curvature. ROM normal. No CVA tenderness. Cardio: diastolic murmur: holodiastolic 4/6, blowing throughout the precordium GI: soft, non-tender; bowel sounds normal; no masses,  no organomegaly Extremities: extremities normal, atraumatic, no cyanosis or edema and Homans sign is negative, no sign of DVT Neurologic: Grossly normal  Diagnostic Studies & Laboratory data:     Recent Radiology Findings:  Dg Chest 2 View  Result Date: 04/29/2016 CLINICAL DATA:  Preop evaluation for upcoming cardiac procedure EXAM: CHEST  2 VIEW COMPARISON:  02/01/2008, 03/24/2016 FINDINGS: Cardiac shadow is within normal limits. The lungs are well aerated bilaterally. Previously seen nodular densities bilaterally are not well appreciated on this exam. No focal infiltrate or sizable effusion is seen. No bony abnormality is noted. IMPRESSION: No active cardiopulmonary disease. Electronically  Signed   By: Alcide Clever M.D.   On: 04/29/2016 15:34   Ct Angio Chest Aorta W &/or Wo Contrast  Result Date: 03/24/2016 CLINICAL DATA:  Shortness of breath. History of aortic insufficiency. EXAM: CT ANGIOGRAPHY CHEST WITH CONTRAST TECHNIQUE: Multidetector CT imaging of the chest was performed using the standard protocol during bolus administration of intravenous contrast. Multiplanar CT image reconstructions and MIPs were obtained to evaluate the vascular anatomy. CONTRAST:  75 cc Isovue 370 COMPARISON:  Chest x-ray 02/01/2008 FINDINGS: Chest wall: No breast masses, supraclavicular or axillary lymphadenopathy. The thyroid gland appears normal. Cardiovascular: The heart is within normal limits in size. The left ventricle is dilated. Moderate thinning of the myocardium at the apex of the left ventricle possibly due to remote infarct. No definite aneurysm. The aorta is normal in caliber. No dissection or atherosclerotic calcifications. The branch vessels are normal. A few scattered coronary artery calcifications are noted. The pulmonary arteries appear normal. No pericardial effusion. Mediastinum/Nodes: No mesenteric or retroperitoneal mass or adenopathy. The esophagus is grossly normal. Lungs/Pleura: No acute pulmonary findings. No infiltrates, edema or pneumothorax. There are small scattered pulmonary nodules. The largest nodule is in the left upper lobe on image number 62 measures 5 mm. There is also a 3 mm right lower lobe nodule on image number 65 an 80 4 mm nodule in the left lower lobe on image number 109. No pleural effusion. No bronchiectasis or interstitial lung disease. Upper Abdomen: No significant upper abdominal findings. The upper abdominal aorta and branch vessels. Musculoskeletal: No significant bony findings.  The Review of the MIP images confirms the above findings. IMPRESSION: 1. Normal CT appearance of the thoracic aorta and upper abdominal aorta. No aneurysm or dissection. 2. 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 3. Small pulmonary nodules as discussed above. No follow-up needed if patient is low-risk (and has no known or suspected primary neoplasm). Non-contrast chest CT can be considered in 12 months if patient is high-risk. This recommendation follows the consensus statement: Guidelines for Management of Incidental Pulmonary Nodules Detected on CT Images: From the Fleischner Society 2017; Radiology 2017; 284:228-243. Electronically Signed   By: Rudie Meyer M.D.   On: 03/24/2016 12:34     I have independently reviewed the above radiologic studies.  Recent Lab Findings: Lab Results  Component Value Date   WBC 5.6 04/29/2016   HGB 11.5 (L) 04/29/2016   HCT 35.3 (L) 04/29/2016   PLT 283 04/29/2016   GLUCOSE 161 (H) 04/29/2016   CHOL 247 (H) 08/18/2015   TRIG 73 08/18/2015   HDL 108 08/18/2015   LDLCALC 124 08/18/2015   ALT 12 (L) 04/29/2016   AST 17 04/29/2016   NA 139 04/29/2016   K 4.3 04/29/2016   CL 107 04/29/2016   CREATININE 0.98 04/29/2016   BUN 12 04/29/2016   CO2 25 04/29/2016   TSH 1.39 08/18/2015   INR 0.98 04/29/2016   HGBA1C 5.4 04/29/2016     ECHO: Result status: Final result                           *[facility]*                        [address]                        [place], [place] [address]                            [phone]  ------------------------------------------------------------------- Transthoracic Echocardiography  Patient:    Wanda [name], Wanda [name] MR #:       [id number] Study Date: 02/08/2016 Gender:     F Age:        40 Height:     161.3 cm Weight:     65 kg BSA:        1.72 m^2 Pt. Status: Room:   ATTENDING    Tonny Bollman, MD  ORDERING     Tonny Bollman, MD  REFERRING    Tonny Bollman, MD  SONOGRAPHER  Dewitt Hoes, RDCS  PERFORMING   [facility], Outpatient  cc:  ------------------------------------------------------------------- LV  EF: 40% -   45%  ------------------------------------------------------------------- Indications:      Murmur (R01.1).  ------------------------------------------------------------------- History:   Risk factors:  Former tobacco use.  ------------------------------------------------------------------- Study Conclusions  - Left ventricle: The cavity size was moderately dilated. Wall   thickness was normal. Systolic function was mildly to moderately   reduced. The estimated ejection fraction was in the range of 40%   to 45%. Mild diffuse hypokinesis with no identifiable regional   variations. Features are consistent with a pseudonormal left   ventricular filling pattern, with concomitant abnormal relaxation   and increased filling pressure (grade 2 diastolic dysfunction). - Aortic valve: Possibly bicuspid; mildly thickened, mildly   calcified leaflets. There was very mild stenosis. There was   severe regurgitation directed eccentrically in the LVOT and   towards the mitral anterior leaflet. - Mitral valve: There was mild regurgitation. - Left atrium: The atrium was mildly dilated. - Atrial septum: No defect or patent foramen ovale was identified.  ------------------------------------------------------------------- Study data:  No prior study was available for comparison.  Study status:  Routine.  Procedure:  The patient reported no pain pre or post test. Transthoracic echocardiography. Image quality was adequate.  Study completion:  There were no complications. Transthoracic echocardiography.  M-mode, complete 2D, spectral Doppler, and color Doppler.  Birthdate:  Patient birthdate: [birth year].  Age:  Patient is 66 yr old.  Sex:  Gender: female. BMI: 25 kg/m^2.  Blood pressure:     150/50  Patient status: Outpatient.  Study date:  Study date:  02/08/2016. Study time: 01:08 PM.  Location:  [facility]  -------------------------------------------------------------------  ------------------------------------------------------------------- Left ventricle:  The cavity size was moderately dilated. Wall thickness was normal. Systolic function was mildly to moderately reduced. The estimated ejection fraction was in the range of 40% to 45%.  Mild diffuse hypokinesis with no identifiable regional variations. Features are consistent with a pseudonormal left ventricular filling pattern, with concomitant abnormal relaxation and increased filling pressure (grade 2 diastolic dysfunction).  ------------------------------------------------------------------- Aortic valve:  Poorly visualized.  Possibly bicuspid; mildly thickened, mildly calcified leaflets.  Doppler:   There was very mild stenosis.   There was severe regurgitation directed eccentrically in the LVOT and towards the mitral anterior leaflet.   VTI ratio of LVOT to aortic valve: 0.56. Valve area (VTI): 1.77 cm^2. Indexed valve area (VTI): 1.03 cm^2/m^2. Peak velocity ratio of LVOT to aortic valve: 0.54. Valve area (Vmax): 1.7 cm^2. Indexed valve area (Vmax): 0.99 cm^2/m^2. Mean velocity ratio of LVOT to aortic valve: 0.54. Valve area (Vmean): 1.71 cm^2. Indexed valve area (Vmean): 0.99 cm^2/m^2.    Mean gradient (S): 14 mm Hg. Peak gradient (S): 28 mm Hg.  ------------------------------------------------------------------- Aorta:  The aorta was without evidence of coarctation. Aortic root: The aortic root was normal in size. Ascending aorta: The ascending aorta was normal in size.  ------------------------------------------------------------------- Mitral valve:   Mildly thickened leaflets .  Doppler:  There was mild regurgitation.    Peak gradient (D): 8 mm Hg.  ------------------------------------------------------------------- Left atrium:  The atrium was mildly  dilated.  ------------------------------------------------------------------- Atrial septum:  No defect or patent foramen ovale was identified.   ------------------------------------------------------------------- Right ventricle:  The cavity size was normal. Wall thickness was normal. Systolic function was normal.  ------------------------------------------------------------------- Pulmonic valve:    Structurally normal valve.   Cusp separation was normal.  Doppler:  Transvalvular velocity was within the normal range. There was no regurgitation.  ------------------------------------------------------------------- Tricuspid valve:   Structurally normal valve.   Leaflet separation was normal.  Doppler:  Transvalvular velocity was within the normal range. There was no regurgitation.  ------------------------------------------------------------------- Pulmonary artery:    Systolic pressure could not be accurately estimated.  ------------------------------------------------------------------- Right atrium:  The atrium was normal in size.  ------------------------------------------------------------------- Pericardium:  There was no pericardial effusion.  ------------------------------------------------------------------- Systemic veins: Inferior vena cava: The vessel was normal in size. The respirophasic diameter changes were in the normal range (>= 50%), consistent with normal central venous pressure.  ------------------------------------------------------------------- Post procedure conclusions Ascending Aorta:  - The aorta was without evidence of coarctation.  ------------------------------------------------------------------- Measurements   Left ventricle                            Value          Reference  LV ID, ED, PLAX chordal           (H)     64.3  mm       43 - 52  LV ID, ES, PLAX chordal           (H)     50.1  mm       23 - 38  LV fx shortening, PLAX  chordal    (L)     22    %        >=29  LV PW thickness, ED  9.4   mm       ---------  IVS/LV PW ratio, ED                       0.71           <=1.3  Stroke volume, 2D                         105   ml       ---------  Stroke volume/bsa, 2D                     61    ml/m^2   ---------  LV end-diastolic volume, 1-p A4C          195   ml       ---------  LV end-systolic volume, 1-p A4C           113   ml       ---------  LV ejection fraction, 1-p A4C             42    %        ---------  Stroke volume, 1-p A4C                    82    ml       ---------  LV end-diastolic volume/bsa, 1-p          114   ml/m^2   ---------  A4C  LV end-systolic volume/bsa, 1-p           66    ml/m^2   ---------  A4C  Stroke volume/bsa, 1-p A4C                48    ml/m^2   ---------  LV e&', lateral                            6.09  cm/s     ---------  LV E/e&', lateral                          22.66          ---------  LV e&', medial                             4.35  cm/s     ---------  LV E/e&', medial                           31.72          ---------  LV e&', average                            5.22  cm/s     ---------  LV E/e&', average                          26.44          ---------    Ventricular septum                        Value          Reference  IVS thickness, ED  6.66  mm       ---------    LVOT                                      Value          Reference  LVOT ID, S                                20    mm       ---------  LVOT area                                 3.14  cm^2     ---------  LVOT peak velocity, S                     142   cm/s     ---------  LVOT mean velocity, S                     91.5  cm/s     ---------  LVOT VTI, S                               33.4  cm       ---------  LVOT peak gradient, S                     8     mm Hg    ---------    Aortic valve                              Value          Reference  Aortic valve peak  velocity, S             263   cm/s     ---------  Aortic valve mean velocity, S             168   cm/s     ---------  Aortic valve VTI, S                       59.2  cm       ---------  Aortic mean gradient, S                   14    mm Hg    ---------  Aortic peak gradient, S                   28    mm Hg    ---------  VTI ratio, LVOT/AV                        0.56           ---------  Aortic valve area, VTI                    1.77  cm^2     ---------  Aortic valve area/bsa, VTI                1.03  cm^2/m^2 ---------  Velocity ratio,  peak, LVOT/AV             0.54           ---------  Aortic valve area, peak velocity          1.7   cm^2     ---------  Aortic valve area/bsa, peak               0.99  cm^2/m^2 ---------  velocity  Velocity ratio, mean, LVOT/AV             0.54           ---------  Aortic valve area, mean velocity          1.71  cm^2     ---------  Aortic valve area/bsa, mean               0.99  cm^2/m^2 ---------  velocity  Aortic regurg pressure half-time          234   ms       ---------    Aorta                                     Value          Reference  Aortic root ID, ED                        33    mm       ---------  Ascending aorta ID, A-P, S                32    mm       ---------    Left atrium                               Value          Reference  LA ID, A-P, ES                            35    mm       ---------  LA ID/bsa, A-P                            2.04  cm/m^2   <=2.2  LA volume, S                              70.7  ml       ---------  LA volume/bsa, S                          41.1  ml/m^2   ---------  LA volume, ES, 1-p A4C                    52.6  ml       ---------  LA volume/bsa, ES, 1-p A4C                30.6  ml/m^2   ---------  LA volume, ES, 1-p A2C                    89.9  ml       ---------  LA volume/bsa, ES, 1-p A2C                52.3  ml/m^2   ---------    Mitral valve                              Value          Reference  Mitral  E-wave peak velocity               138   cm/s     ---------  Mitral A-wave peak velocity               129   cm/s     ---------  Mitral deceleration time          (H)     232   ms       150 - 230  Mitral peak gradient, D                   8     mm Hg    ---------  Mitral E/A ratio, peak                    1.1            ---------    Systemic veins                            Value          Reference  Estimated CVP                             3     mm Hg    ---------    Right ventricle                           Value          Reference  TAPSE                                     24.8  mm       ---------  RV s&', lateral, S                         11.4  cm/s     ---------  Legend: (L)  and  (H)  mark values outside specified reference range.  ------------------------------------------------------------------- Prepared and Electronically Authenticated by  Thurmon Fair, MD 2017-11-27T16:15:17   CATH: Procedures   Right Heart Cath and Coronary Angiography  Conclusion   1. Severe aortic valve regurgitation 2. Widely patent coronary arteries with minimal irregularity of the LAD at the origin of the first diagonal 3. Normal right heart hemodynamics  Indications   Severe aortic regurgitation [I35.1 (ICD-10-CM)]  Procedural Details/Technique   Technical Details INDICATION: Severe aortic regurgitation, moderate LV dysfunction, preoperative study  PROCEDURAL DETAILS: There was an indwelling IV in a right antecubital vein. Using normal sterile technique, the IV was changed out for a 5 Fr brachial sheath over a 0.018 inch wire. The right wrist was then prepped, draped, and anesthetized with 1% lidocaine. Using the modified Seldinger technique a 5/6 French Slender sheath was placed in the right radial artery. Intra-arterial  verapamil was administered through the radial artery sheath. IV heparin was administered after a JR4 catheter was advanced into the central aorta. A Swan-Ganz catheter  was used for the right heart catheterization. Standard protocol was followed for recording of right heart pressures and sampling of oxygen saturations. Fick cardiac output was calculated. Standard Judkins catheters were used for selective coronary angiography and aortic root angiography. There were no immediate procedural complications. The patient was transferred to the post catheterization recovery area for further monitoring.       Estimated blood loss <50 mL.  During this procedure the patient was administered the following to achieve and maintain moderate conscious sedation: Versed 3 mg, Fentanyl 50 mcg, while the patient's heart rate, blood pressure, and oxygen saturation were continuously monitored. The period of conscious sedation was 35 minutes, of which I was present face-to-face 100% of this time.    Coronary Findings   Dominance: Right  Left Anterior Descending  Prox LAD lesion, 20% stenosed. The lesion is calcified. there is minimal irregularity of the proximal LAD at the origin of the first diagonal with associated calcification.  Ramus Intermedius  Vessel is angiographically normal.  Left Circumflex  Vessel is angiographically normal.  Right Coronary Artery  Vessel is angiographically normal.  Right Heart   Right Heart Pressures There is a negative vasodilator response. LV EDP is normal.    Left Heart   Aortic Valve There is severe (4+) aortic regurgitation.    Coronary Diagrams   Diagnostic Diagram     Implants     No implant documentation for this case.  PACS Images   Show images for Cardiac catheterization   Link to Procedure Log   Procedure Log    Hemo Data   Flowsheet Row Most Recent Value  Fick Cardiac Output 5.13 L/min  Fick Cardiac Output Index 3.04 (L/min)/BSA  RA A Wave 15 mmHg  RA V Wave 8 mmHg  RA Mean 7 mmHg  RV Systolic Pressure 29 mmHg  RV Diastolic Pressure 3 mmHg  RV EDP 8 mmHg  PA Systolic Pressure 27 mmHg  PA Diastolic  Pressure 13 mmHg  PA Mean 19 mmHg  PW A Wave 18 mmHg  PW V Wave 13 mmHg  PW Mean 11 mmHg  AO Systolic Pressure 141 mmHg  AO Diastolic Pressure 60 mmHg  AO Mean 91 mmHg  LV Systolic Pressure 149 mmHg  LV Diastolic Pressure 10 mmHg  LV EDP 18 mmHg  Arterial Occlusion Pressure Extended Systolic Pressure 133 mmHg  Arterial Occlusion Pressure Extended Diastolic Pressure 50 mmHg  Arterial Occlusion Pressure Extended Mean Pressure 81 mmHg  Left Ventricular Apex Extended Systolic Pressure 141 mmHg  Left Ventricular Apex Extended Diastolic Pressure 9 mmHg  Left Ventricular Apex Extended EDP Pressure 20 mmHg  QP/QS 1.24  TPVR Index 5.06 HRUI  TSVR Index 30 HRUI  PVR SVR Ratio 0.08  TPVR/TSVR Ratio 0.17     Assessment / Plan:   1/Patient with symptomatic aortic insufficiency with a bicuspid aortic valve and depressed ejection fraction- 2/ Normal CT appearance of the thoracic aorta and upper abdominal aorta. No aneurysm or dissection.   I have had extensive discussion with patient about pros and cons of mechanical vs tissue valves , she prefers tissue valve.   The patient has a family history in a brother of hypercoagulable state, her brother also had a bicuspid aortic valve and aortic valve replacement for severe aortic stenosis.   The goals risks and alternatives of the planned surgical  procedure AVR   have been discussed with the patient in detail. The risks of the procedure including death, infection, stroke, myocardial infarction, bleeding, blood transfusion, heart block and need for pacer  have all been discussed specifically.  I have quoted Wanda [name] a 3 % of perioperative mortality and a complication rate as high as 35%. The patient's questions have been answered.Wanda [name] is willing  to proceed with the planned procedure.  [name] MD      [address] [place] [place] [address] Office [phone]   Beeper [phone]  05/03/2016 7:19 AM

## 2016-05-03 NOTE — Brief Op Note (Addendum)
05/03/2016  11:14 AM  PATIENT:  Wanda [name]  66 y.o. female  PRE-OPERATIVE DIAGNOSIS:  Severe aortic insufficiency  POST-OPERATIVE DIAGNOSIS:  Severe aortic insufficiency  PROCEDURE:  TRANSESOPHAGEAL ECHOCARDIOGRAM (TEE) , MEDIAN STERNOTOMY for AORTIC VALVE REPLACEMENT (AVR) WITH MAGNA EASE PERICARDIAL BIOPROSTHESIS size 21 MM   SURGEON:  Surgeon(s) and Role:    * [name], MD - Primary  PHYSICIAN ASSISTANT: Doree Fudge PA-C  ANESTHESIA:   general  EBL:  Total I/O In: 1800 [I.V.:1800] Out: 350 [Urine:350]  DRAINS: Chest tubes placed in the mediastinal  SPECIMEN:  Source of Specimen:  Native aortic valve leaflets  DISPOSITION OF SPECIMEN:  PATHOLOGY  COUNTS CORRECT:  YES  DICTATION: .Dragon Dictation  PLAN OF CARE: Admit to inpatient   PATIENT DISPOSITION:  ICU - intubated and hemodynamically stable.   Delay start of Pharmacological VTE agent (>24hrs) due to surgical blood loss or risk of bleeding: yes  BASELINE WEIGHT: 69.9 kg  One unit PRBS given for low hgb while on pump

## 2016-05-03 NOTE — Anesthesia Preprocedure Evaluation (Addendum)
Anesthesia Evaluation  Patient identified by MRN, date of birth, ID band Patient awake    Reviewed: Allergy & Precautions, NPO status , Patient's Chart, lab work & pertinent test results  Airway Mallampati: II  TM Distance: >3 FB     Dental  (+) Teeth Intact   Pulmonary shortness of breath, former smoker,    breath sounds clear to auscultation       Cardiovascular  Rhythm:Regular Rate:Normal  History noted. CG   Neuro/Psych  Headaches,    GI/Hepatic negative GI ROS, Neg liver ROS,   Endo/Other  negative endocrine ROS  Renal/GU negative Renal ROS     Musculoskeletal  (+) Arthritis ,   Abdominal   Peds  Hematology negative hematology ROS (+)   Anesthesia Other Findings   Reproductive/Obstetrics                            Anesthesia Physical Anesthesia Plan  ASA: III  Anesthesia Plan: General   Post-op Pain Management:    Induction: Intravenous  Airway Management Planned: Oral ETT  Additional Equipment: Arterial line, CVP, PA Cath, 3D TEE and Ultrasound Guidance Line Placement  Intra-op Plan:   Post-operative Plan: Post-operative intubation/ventilation  Informed Consent: I have reviewed the patients History and Physical, chart, labs and discussed the procedure including the risks, benefits and alternatives for the proposed anesthesia with the patient or authorized representative who has indicated his/her understanding and acceptance.   Dental advisory given  Plan Discussed with: CRNA and Anesthesiologist  Anesthesia Plan Comments:         Anesthesia Quick Evaluation

## 2016-05-03 NOTE — Procedures (Signed)
Extubation Procedure Note  Patient Details:   Name: Wanda [name] DOB: 11-07-1950 MRN: [id number]   Airway Documentation:     Evaluation  O2 sats: stable throughout Complications: No apparent complications Patient did tolerate procedure well. Bilateral Breath Sounds: Clear   Yes  Patient tolerated rapid wean. NIF -30 and VC 1.1 L. Positive for cuff leak. Patient extubated to a 2 Lpm nasal cannula. No signs of dyspnea or stridor. Patient instructed on the Incentive Spirometer achieving 600 mL three times. Patient resting comfortably. RN at bedside.   Ancil Boozer 05/03/2016, 5:47 PM

## 2016-05-03 NOTE — Anesthesia Procedure Notes (Signed)
Central Venous Catheter Insertion Performed by: Arta Bruce, anesthesiologist Start/End2/20/2018 6:30 AM, 05/03/2016 6:45 AM Preanesthetic checklist: patient identified, IV checked, risks and benefits discussed, surgical consent, monitors and equipment checked, pre-op evaluation, timeout performed and anesthesia consent Position: Trendelenburg Lidocaine 1% used for infiltration and patient sedated Hand hygiene performed , maximum sterile barriers used  and Seldinger technique used Sheath introducer Swan type:thermodilution Procedure performed using ultrasound guided technique. Ultrasound Notes:anatomy identified, needle tip was noted to be adjacent to the nerve/plexus identified, no ultrasound evidence of intravascular and/or intraneural injection and image(s) printed for medical record Following insertion, line sutured and dressing applied. Post procedure assessment: blood return through all ports, free fluid flow and no air  Patient tolerated the procedure well with no immediate complications.

## 2016-05-03 NOTE — Anesthesia Postprocedure Evaluation (Signed)
Anesthesia Post Note  Patient: Wanda [name]  Procedure(s) Performed: Procedure(s) (LRB): AORTIC VALVE REPLACEMENT (AVR) WITH MAGNA EASE PERICARDIAL BIOPROSTHESIS - AORTIC 21 MM MODEL 3300TFX (N/A) TRANSESOPHAGEAL ECHOCARDIOGRAM (TEE) (N/A)  Patient location during evaluation: SICU Anesthesia Type: General Level of consciousness: patient remains intubated per anesthesia plan Pain management: pain level controlled Vital Signs Assessment: post-procedure vital signs reviewed and stable Respiratory status: patient remains intubated per anesthesia plan Cardiovascular status: stable Anesthetic complications: no       Last Vitals:  Vitals:   05/03/16 1610 05/03/16 1615  BP:    Pulse: 86 86  Resp: 10 11  Temp: 37 C 37 C    Last Pain:  Vitals:   05/03/16 1300  TempSrc: Core (Comment)                 Renald Haithcock

## 2016-05-03 NOTE — OR Nursing (Signed)
1213 Second call made to SICU.

## 2016-05-03 NOTE — Progress Notes (Signed)
CT surgery p.m. Rounds  Patient doing well, extubated, neuro intact Heart rate 72 normal sinus rhythm Complaining of musculoskeletal pain-we'll give 1 dose of Toradol with normal BUN creatinine.

## 2016-05-03 NOTE — OR Nursing (Signed)
1243 Rolling call made to SICU. 

## 2016-05-03 NOTE — OR Nursing (Signed)
1143 First call made to SICU.

## 2016-05-03 NOTE — Progress Notes (Signed)
  Echocardiogram Echocardiogram Transesophageal has been performed.  Janalyn Harder 05/03/2016, 9:27 AM

## 2016-05-03 NOTE — Transfer of Care (Signed)
Immediate Anesthesia Transfer of Care Note  Patient: Raelyn J Billing  Procedure(s) Performed: Procedure(s): AORTIC VALVE REPLACEMENT (AVR) WITH MAGNA EASE PERICARDIAL BIOPROSTHESIS - AORTIC 21 MM MODEL 3300TFX (N/A) TRANSESOPHAGEAL ECHOCARDIOGRAM (TEE) (N/A)  Patient Location: SICU  Anesthesia Type:General  Level of Consciousness: sedated, unresponsive and Patient remains intubated per anesthesia plan  Airway & Oxygen Therapy: Patient remains intubated per anesthesia plan and Patient placed on Ventilator (see vital sign flow sheet for setting)  Post-op Assessment: Report given to RN and Post -op Vital signs reviewed and stable  Post vital signs: Reviewed and stable  Last Vitals:  Vitals:   05/03/16 0614  BP: (!) 147/51  Pulse: 73  Resp: 18  Temp: 37.2 C    Last Pain:  Vitals:   05/03/16 0614  TempSrc: Oral      Patients Stated Pain Goal: 3 (05/03/16 7106)  Complications: No apparent anesthesia complications

## 2016-05-04 ENCOUNTER — Inpatient Hospital Stay (HOSPITAL_COMMUNITY): Payer: BC Managed Care – PPO

## 2016-05-04 ENCOUNTER — Encounter (HOSPITAL_COMMUNITY): Payer: Self-pay | Admitting: Cardiothoracic Surgery

## 2016-05-04 LAB — BASIC METABOLIC PANEL
ANION GAP: 9 (ref 5–15)
BUN: 9 mg/dL (ref 6–20)
CALCIUM: 7.4 mg/dL — AB (ref 8.9–10.3)
CHLORIDE: 105 mmol/L (ref 101–111)
CO2: 21 mmol/L — ABNORMAL LOW (ref 22–32)
CREATININE: 0.84 mg/dL (ref 0.44–1.00)
GFR calc non Af Amer: >60 mL/min (ref >60)
Glucose, Bld: 112 mg/dL — ABNORMAL HIGH (ref 65–99)
Potassium: 4.5 mmol/L (ref 3.5–5.1)
SODIUM: 135 mmol/L (ref 135–145)

## 2016-05-04 LAB — CBC
HEMATOCRIT: 31 % — AB (ref 36.0–46.0)
HEMATOCRIT: 34 % — AB (ref 36.0–46.0)
HEMOGLOBIN: 10.1 g/dL — AB (ref 12.0–15.0)
Hemoglobin: 11 g/dL — ABNORMAL LOW (ref 12.0–15.0)
MCH: 29.8 pg (ref 26.0–34.0)
MCH: 29.9 pg (ref 26.0–34.0)
MCHC: 32.6 g/dL (ref 30.0–36.0)
MCHC: 32.4 g/dL (ref 30.0–36.0)
MCV: 91.4 fL (ref 78.0–100.0)
MCV: 92.4 fL (ref 78.0–100.0)
Platelets: 117 10*3/uL — ABNORMAL LOW (ref 150–400)
Platelets: 113 10*3/uL — ABNORMAL LOW (ref 150–400)
RBC: 3.39 MIL/uL — AB (ref 3.87–5.11)
RBC: 3.68 MIL/uL — ABNORMAL LOW (ref 3.87–5.11)
RDW: 15.4 % (ref 11.5–15.5)
RDW: 15.7 % — ABNORMAL HIGH (ref 11.5–15.5)
WBC: 12.7 10*3/uL — AB (ref 4.0–10.5)
WBC: 11.8 10*3/uL — ABNORMAL HIGH (ref 4.0–10.5)

## 2016-05-04 LAB — GLUCOSE, CAPILLARY
Glucose-Capillary: 103 mg/dL — ABNORMAL HIGH (ref 65–99)
Glucose-Capillary: 104 mg/dL — ABNORMAL HIGH (ref 65–99)
Glucose-Capillary: 105 mg/dL — ABNORMAL HIGH (ref 65–99)
Glucose-Capillary: 109 mg/dL — ABNORMAL HIGH (ref 65–99)
Glucose-Capillary: 116 mg/dL — ABNORMAL HIGH (ref 65–99)
Glucose-Capillary: 118 mg/dL — ABNORMAL HIGH (ref 65–99)
Glucose-Capillary: 120 mg/dL — ABNORMAL HIGH (ref 65–99)
Glucose-Capillary: 121 mg/dL — ABNORMAL HIGH (ref 65–99)
Glucose-Capillary: 96 mg/dL (ref 65–99)

## 2016-05-04 LAB — POCT I-STAT, CHEM 8
BUN: 14 mg/dL (ref 6–20)
Calcium, Ion: 1.22 mmol/L (ref 1.15–1.40)
Chloride: 101 mmol/L (ref 101–111)
Creatinine, Ser: 0.8 mg/dL (ref 0.44–1.00)
Glucose, Bld: 132 mg/dL — ABNORMAL HIGH (ref 65–99)
HCT: 33 % — ABNORMAL LOW (ref 36.0–46.0)
Hemoglobin: 11.2 g/dL — ABNORMAL LOW (ref 12.0–15.0)
Potassium: 4.8 mmol/L (ref 3.5–5.1)
Sodium: 136 mmol/L (ref 135–145)
TCO2: 23 mmol/L (ref 0–100)

## 2016-05-04 LAB — COOXEMETRY PANEL
Carboxyhemoglobin: 1.1 % (ref 0.5–1.5)
Methemoglobin: 1.4 % (ref 0.0–1.5)
O2 Saturation: 80.1 %
Total hemoglobin: 9.7 g/dL — ABNORMAL LOW (ref 12.0–16.0)

## 2016-05-04 LAB — CREATININE, SERUM
Creatinine, Ser: 0.91 mg/dL (ref 0.44–1.00)
GFR calc Af Amer: >60 mL/min (ref >60)

## 2016-05-04 LAB — MAGNESIUM
MAGNESIUM: 2.5 mg/dL — AB (ref 1.7–2.4)
Magnesium: 2.3 mg/dL (ref 1.7–2.4)

## 2016-05-04 MED ORDER — AMIODARONE HCL IN DEXTROSE 360-4.14 MG/200ML-% IV SOLN
30.0000 mg/h | INTRAVENOUS | Status: DC
  Administered 2016-05-04 – 2016-05-07 (×6): 30 mg/h via INTRAVENOUS
  Filled 2016-05-04 (×6): qty 200

## 2016-05-04 MED ORDER — METOPROLOL TARTRATE 12.5 MG HALF TABLET
12.5000 mg | ORAL_TABLET | Freq: Two times a day (BID) | ORAL | Status: DC
Start: 2016-05-05 — End: 2016-05-06
  Administered 2016-05-05 – 2016-05-06 (×3): 12.5 mg via ORAL
  Filled 2016-05-04 (×3): qty 1

## 2016-05-04 MED ORDER — PROMETHAZINE HCL 25 MG/ML IJ SOLN
6.2500 mg | Freq: Four times a day (QID) | INTRAMUSCULAR | Status: DC | PRN
  Administered 2016-05-04 – 2016-05-08 (×9): 6.25 mg via INTRAVENOUS
  Filled 2016-05-04 (×10): qty 1

## 2016-05-04 MED ORDER — ENOXAPARIN SODIUM 30 MG/0.3ML ~~LOC~~ SOLN
30.0000 mg | SUBCUTANEOUS | Status: DC
Start: 2016-05-04 — End: 2016-05-10
  Administered 2016-05-04 – 2016-05-09 (×6): 30 mg via SUBCUTANEOUS
  Filled 2016-05-04 (×6): qty 0.3

## 2016-05-04 MED ORDER — INSULIN ASPART 100 UNIT/ML ~~LOC~~ SOLN
0.0000 [IU] | SUBCUTANEOUS | Status: DC
  Administered 2016-05-05 – 2016-05-06 (×3): 2 [IU] via SUBCUTANEOUS

## 2016-05-04 MED ORDER — AMIODARONE HCL IN DEXTROSE 360-4.14 MG/200ML-% IV SOLN
INTRAVENOUS | Status: AC
  Filled 2016-05-04: qty 200

## 2016-05-04 MED ORDER — KETOROLAC TROMETHAMINE 15 MG/ML IJ SOLN
15.0000 mg | Freq: Once | INTRAMUSCULAR | Status: AC
  Administered 2016-05-04: 15 mg via INTRAVENOUS
  Filled 2016-05-04: qty 1

## 2016-05-04 MED ORDER — METOPROLOL TARTRATE 25 MG/10 ML ORAL SUSPENSION: 12.5000 mg | Freq: Two times a day (BID) | ORAL | Status: DC

## 2016-05-04 MED ORDER — FUROSEMIDE 10 MG/ML IJ SOLN
20.0000 mg | Freq: Once | INTRAMUSCULAR | Status: AC
  Administered 2016-05-04: 20 mg via INTRAVENOUS
  Filled 2016-05-04: qty 2

## 2016-05-04 MED ORDER — INSULIN ASPART 100 UNIT/ML ~~LOC~~ SOLN: 0.0000 [IU] | SUBCUTANEOUS | Status: DC

## 2016-05-04 MED ORDER — AMIODARONE LOAD VIA INFUSION
150.0000 mg | Freq: Once | INTRAVENOUS | Status: AC
  Administered 2016-05-04: 150 mg via INTRAVENOUS
  Filled 2016-05-04: qty 83.34

## 2016-05-04 MED ORDER — AMIODARONE HCL IN DEXTROSE 360-4.14 MG/200ML-% IV SOLN
60.0000 mg/h | INTRAVENOUS | Status: AC
Start: 2016-05-04 — End: 2016-05-04
  Administered 2016-05-04: 60 mg/h via INTRAVENOUS
  Filled 2016-05-04: qty 200

## 2016-05-04 MED FILL — Heparin Sodium (Porcine) Inj 1000 Unit/ML: INTRAMUSCULAR | Qty: 2500 | Status: AC

## 2016-05-04 NOTE — Progress Notes (Signed)
Spoke with Dr. Dorris Fetch regarding pt's minimal UOP (~15cc/hr). Pt has been fluid overloaded and did not receive any diuresis today. Crt 0.9 today. MD ordered one time dose 20mg  IV Lasix.  Will administer and continue to monitor.  Peyton Bottoms, RN 8:45 PM 05/04/16

## 2016-05-04 NOTE — Progress Notes (Addendum)
[facility] DAILY ICU PROGRESS NOTE                   [address]            [place] [address]          [phone]   1 Day Post-Op Procedure(s) (LRB): AORTIC VALVE REPLACEMENT (AVR) WITH MAGNA EASE PERICARDIAL BIOPROSTHESIS - AORTIC 21 MM MODEL 3300TFX (N/A) TRANSESOPHAGEAL ECHOCARDIOGRAM (TEE) (N/A)  Total Length of Stay:  LOS: 1 day   Subjective: Patient resting this am. Has incisional pain.  Objective: Vital signs in last 24 hours: Temp:  [98.1 F (36.7 C)-99.3 F (37.4 C)] 98.4 F (36.9 C) (02/21 0700) Pulse Rate:  [63-90] 76 (02/21 0700) Cardiac Rhythm: Normal sinus rhythm (02/21 0400) Resp:  [10-27] 11 (02/21 0700) BP: (92-121)/(60-82) 98/62 (02/21 0600) SpO2:  [92 %-100 %] 92 % (02/21 0700) Arterial Line BP: (86-129)/(50-77) 121/61 (02/21 0700) FiO2 (%):  [40 %-50 %] 40 % (02/20 1656) Weight:  [143 lb 1.6 oz (64.9 kg)-157 lb 13.6 oz (71.6 kg)] 157 lb 13.6 oz (71.6 kg) (02/21 0500)  Filed Weights   05/03/16 1300 05/04/16 0500  Weight: 143 lb 1.6 oz (64.9 kg) 157 lb 13.6 oz (71.6 kg)    Weight change:    Hemodynamic parameters for last 24 hours: PAP: (12-41)/(1-15) 41/15 CO:  [2.8 L/min-5.1 L/min] 4.8 L/min CI:  [1.7 L/min/m2-3 L/min/m2] 2.8 L/min/m2  Intake/Output from previous day: 02/20 0701 - 02/21 0700 In: 6033.5 [P.O.:140; I.V.:3888.5; Blood:500; IV Piggyback:1415] Out: 2710 [Urine:1520; Blood:800; Chest Tube:390]  Intake/Output this shift: No intake/output data recorded.  Current Meds: Scheduled Meds: . acetaminophen  1,000 mg Oral Q6H   Or  . acetaminophen (TYLENOL) oral liquid 160 mg/5 mL  1,000 mg Per Tube Q6H  . amphetamine-dextroamphetamine  20 mg Oral Daily  . aspirin EC  325 mg Oral Daily   Or  . aspirin  324 mg Per Tube Daily  . bisacodyl  10 mg Oral Daily   Or  . bisacodyl  10 mg Rectal Daily  . cefUROXime (ZINACEF)  IV  1.5 g Intravenous Q12H  . docusate sodium  200 mg Oral Daily  . famotidine (PEPCID) IV  20 mg  Intravenous Q12H  . insulin aspart  0-24 Units Subcutaneous Q4H  . mouth rinse  15 mL Mouth Rinse BID  . metoprolol tartrate  12.5 mg Oral BID   Or  . metoprolol tartrate  12.5 mg Per Tube BID  . [START ON 05/05/2016] pantoprazole  40 mg Oral Daily  . progesterone  100 mg Oral QHS  . sodium chloride flush  3 mL Intravenous Q12H   Continuous Infusions: . sodium chloride 10 mL/hr at 05/04/16 0600  . sodium chloride 250 mL (05/04/16 0617)  . sodium chloride 20 mL/hr at 05/04/16 0600  . dexmedetomidine Stopped (05/03/16 1620)  . lactated ringers 10 mL/hr at 05/04/16 0600  . lactated ringers 20 mL/hr at 05/04/16 0600  . milrinone 0.2 mcg/kg/min (05/04/16 0620)  . nitroGLYCERIN    . phenylephrine (NEO-SYNEPHRINE) Adult infusion 10 mcg/min (05/04/16 0600)   PRN Meds:.sodium chloride, lactated ringers, LORazepam, metoprolol, midazolam, morphine injection, ondansetron (ZOFRAN) IV, sodium chloride flush, traMADol  General appearance: alert, cooperative and no distress Neurologic: intact Heart: RRR, rub with chest tubes in place Lungs: Diminshed at bases bilaterally Abdomen: soft, non-tender; bowel sounds normal; no masses,  no organomegaly Extremities: SCDs in place Wound: Aquacel intact  Lab Results: CBC: Recent Labs  05/03/16 1837  05/03/16 1846 05/04/16 0413  WBC 14.1*  --  12.7*  HGB 11.0* 10.5* 10.1*  HCT 33.2* 31.0* 31.0*  PLT 136*  --  117*   BMET:  Recent Labs  05/03/16 1846 05/04/16 0413  NA 139 135  K 4.5 4.5  CL 107 105  CO2  --  21*  GLUCOSE 139* 112*  BUN 11 9  CREATININE 0.80 0.84  CALCIUM  --  7.4*    CMET: Lab Results  Component Value Date   WBC 12.7 (H) 05/04/2016   HGB 10.1 (L) 05/04/2016   HCT 31.0 (L) 05/04/2016   PLT 117 (L) 05/04/2016   GLUCOSE 112 (H) 05/04/2016   CHOL 247 (H) 08/18/2015   TRIG 73 08/18/2015   HDL 108 08/18/2015   LDLCALC 124 08/18/2015   ALT 12 (L) 04/29/2016   AST 17 04/29/2016   NA 135 05/04/2016   K 4.5 05/04/2016    CL 105 05/04/2016   CREATININE 0.84 05/04/2016   BUN 9 05/04/2016   CO2 21 (L) 05/04/2016   TSH 1.39 08/18/2015   INR 1.40 05/03/2016   HGBA1C 5.4 04/29/2016      PT/INR:  Recent Labs  05/03/16 1300  LABPROT 17.3*  INR 1.40   Radiology: Dg Chest Port 1 View  Result Date: 05/04/2016 CLINICAL DATA:  Status post aortic valve replacement.  Extubation. EXAM: PORTABLE CHEST 1 VIEW COMPARISON:  May 03, 2016 FINDINGS: Endotracheal tube and nasogastric tube have been removed. Swan-Ganz catheter tip is in the right main pulmonary artery. Mediastinal drain is present. Temporary pacemaker wires are attached to the right heart. There is a small right apical pneumothorax. There is interstitial edema with small pleural effusions bilaterally. There is airspace opacity with volume loss in the left lower lobe. There is mild cardiomegaly. The pulmonary vascularity shows evidence of a mild degree of pulmonary venous hypertension. No adenopathy evident. Patient is status post aortic valve replacement. IMPRESSION: Tube and catheter positions as described. There is currently a small right apical pneumothorax without tension component. This pneumothorax is felt to be approximately 5% with respect to size. There is a degree of underlying congestive heart failure. Opacity in the left lower lobe is enlarged part due to atelectasis. There may be either alveolar edema or pneumonia associated in the left lower lobe. The appearance is stable. The cardiac silhouette is stable. These results will be called to the ordering clinician or representative by the Radiologist Assistant, and communication documented in the PACS or zVision Dashboard. Electronically Signed   By: Bretta Bang III M.D.   On: 05/04/2016 07:45   Dg Chest Port 1 View  Result Date: 05/03/2016 CLINICAL DATA:  Postop AVR, ETT, swan, chest tube EXAM: PORTABLE CHEST 1 VIEW COMPARISON:  04/29/2016 FINDINGS: Endotracheal tube terminates 6 cm above the  carina. Suspected small left pleural effusion. Right lung is clear. No pneumothorax. Right IJ Swan-Ganz catheter terminates the main pulmonary artery. Bilateral mediastinal drains. Enteric tube terminates in the proximal stomach. IMPRESSION: Endotracheal tube terminates 6 cm above the carina. Additional support apparatus as above. Small left pleural effusion.  No pneumothorax. Electronically Signed   By: Charline Bills M.D.   On: 05/03/2016 13:31     Assessment/Plan: S/P Procedure(s) (LRB): AORTIC VALVE REPLACEMENT (AVR) WITH MAGNA EASE PERICARDIAL BIOPROSTHESIS - AORTIC 21 MM MODEL 3300TFX (N/A) TRANSESOPHAGEAL ECHOCARDIOGRAM (TEE) (N/A)  1.CV-SR in the 70's this am. EKG reviewed-SR with prolonged QT. CO/CI 5/2.9. On Milrinone and Neo Synephrine drips, and Lopressor 12.5 mg bid.  Will hold Lopressor until am and wean off drips as able. 2. Pulmonary-Chest tubes with 390 cc of output since surgery. No air leak. May be able to remove mediastinal later today if output remains low. On 1 liter of oxygen via [place]. Will wean to room air as tolerates. CXR this am shows small right apical pneumothorax, bibasilar atelectasis and pleural effusions. Encourage incentive spirometer. 3. Volume Overload-Will give 20 mg IV lasix later this am 4. ABL anemia-H and H stable at 10.1 and 31 5. CBGs 109/103/105. Pre op HGA1C 5.4. Will stop accu checks and SS PRN upon transfer 6. Mild thrombocytopenia-platelets 117,000 7. Please see progression orders.   Ardelle Balls PA-C 05/04/2016 8:03 AM   Patient alert and neuro intact, ci improved I have seen and examined Luanna Cole Selden and agree with the above assessment  and plan.  [name] MD Beeper [phone] Office [phone] 05/04/2016 8:52 AM

## 2016-05-04 NOTE — Progress Notes (Signed)
Patient ID: Wanda [name], female   DOB: [birth year], 66 y.o.   MRN: [id number] EVENING ROUNDS NOTE :     [address]       [place] [address]             [phone]                 1 Day Post-Op Procedure(s) (LRB): AORTIC VALVE REPLACEMENT (AVR) WITH MAGNA EASE PERICARDIAL BIOPROSTHESIS - AORTIC 21 MM MODEL 3300TFX (N/A) TRANSESOPHAGEAL ECHOCARDIOGRAM (TEE) (N/A)  Total Length of Stay:  LOS: 1 day  BP 114/88   Pulse (!) 116   Temp 97.3 F (36.3 C)   Resp 20   Ht 5\' 4"  (1.626 m)   Wt 157 lb 13.6 oz (71.6 kg)   LMP 08/12/2004   SpO2 94%   BMI 27.09 kg/m   .Intake/Output      02/20 0701 - 02/21 0700 02/21 0701 - 02/22 0700   P.O. 140    I.V. (mL/kg) 3918.5 (54.7) 547.3 (7.6)   Blood 500    Other 90    IV Piggyback 1415 50   Total Intake(mL/kg) 6063.5 (84.7) 597.3 (8.3)   Urine (mL/kg/hr) 1520 (0.9) 160 (0.2)   Blood 800 (0.5)    Chest Tube 390 (0.2) 0 (0)   Total Output 2710 160   Net +3353.5 +437.3          . sodium chloride Stopped (05/04/16 1000)  . sodium chloride 250 mL (05/04/16 0617)  . sodium chloride 20 mL/hr at 05/04/16 0600  . amiodarone 30 mg/hr (05/04/16 1530)  . dexmedetomidine Stopped (05/03/16 1620)  . lactated ringers 10 mL/hr at 05/04/16 0600  . lactated ringers 20 mL/hr at 05/04/16 0600  . milrinone 0.1 mcg/kg/min (05/04/16 1000)  . nitroGLYCERIN    . phenylephrine (NEO-SYNEPHRINE) Adult infusion Stopped (05/04/16 0800)     Lab Results  Component Value Date   WBC 11.8 (H) 05/04/2016   HGB 11.0 (L) 05/04/2016   HCT 34.0 (L) 05/04/2016   PLT 113 (L) 05/04/2016   GLUCOSE 132 (H) 05/04/2016   CHOL 247 (H) 08/18/2015   TRIG 73 08/18/2015   HDL 108 08/18/2015   LDLCALC 124 08/18/2015   ALT 12 (L) 04/29/2016   AST 17 04/29/2016   NA 136 05/04/2016   K 4.8 05/04/2016   CL 101 05/04/2016   CREATININE 0.91 05/04/2016   BUN 14 05/04/2016   CO2 21 (L) 05/04/2016   TSH 1.39 08/18/2015   INR 1.40 05/03/2016   HGBA1C 5.4  04/29/2016   Developed afib, rate slowed with cordrone Neuro inatct Nausea   [name] MD  Beeper [phone] Office [phone] 05/04/2016 6:10 PM

## 2016-05-04 NOTE — Care Management Note (Signed)
Case Management Note  Patient Details  Name: MONSERRAT PAVEK MRN: [id number] Date of Birth: January 01, 1951  Subjective/Objective:       S/p AVR        Action/Plan:   PTA independent  from home.  CM attempted to assess pt however pt c/o nauseousness so CM will assess at a later time.    Expected Discharge Date:                  Expected Discharge Plan:     In-House Referral:     Discharge planning Services  CM Consult  Post Acute Care Choice:    Choice offered to:     DME Arranged:    DME Agency:     HH Arranged:    HH Agency:     Status of Service:  In process, will continue to follow  If discussed at Long Length of Stay Meetings, dates discussed:    Additional Comments:  Cherylann Parr, RN 05/04/2016, 10:26 AM

## 2016-05-05 ENCOUNTER — Inpatient Hospital Stay (HOSPITAL_COMMUNITY): Payer: BC Managed Care – PPO

## 2016-05-05 LAB — GLUCOSE, CAPILLARY
Glucose-Capillary: 123 mg/dL — ABNORMAL HIGH (ref 65–99)
Glucose-Capillary: 126 mg/dL — ABNORMAL HIGH (ref 65–99)
Glucose-Capillary: 121 mg/dL — ABNORMAL HIGH (ref 65–99)
Glucose-Capillary: 79 mg/dL (ref 65–99)
Glucose-Capillary: 76 mg/dL (ref 65–99)

## 2016-05-05 LAB — BASIC METABOLIC PANEL
Anion gap: 7 (ref 5–15)
BUN: 9 mg/dL (ref 6–20)
CO2: 25 mmol/L (ref 22–32)
Calcium: 8.4 mg/dL — ABNORMAL LOW (ref 8.9–10.3)
Chloride: 101 mmol/L (ref 101–111)
Creatinine, Ser: 0.75 mg/dL (ref 0.44–1.00)
GFR calc Af Amer: >60 mL/min (ref >60)
GFR calc non Af Amer: >60 mL/min (ref >60)
Glucose, Bld: 141 mg/dL — ABNORMAL HIGH (ref 65–99)
Potassium: 4.5 mmol/L (ref 3.5–5.1)
Sodium: 133 mmol/L — ABNORMAL LOW (ref 135–145)

## 2016-05-05 LAB — COOXEMETRY PANEL
Carboxyhemoglobin: 0.8 % (ref 0.5–1.5)
Methemoglobin: 1.2 % (ref 0.0–1.5)
O2 Saturation: 80.7 %
Total hemoglobin: 11.3 g/dL — ABNORMAL LOW (ref 12.0–16.0)

## 2016-05-05 LAB — CBC
HCT: 34.8 % — ABNORMAL LOW (ref 36.0–46.0)
HEMOGLOBIN: 11.1 g/dL — AB (ref 12.0–15.0)
MCH: 29.4 pg (ref 26.0–34.0)
MCHC: 31.9 g/dL (ref 30.0–36.0)
MCV: 92.3 fL (ref 78.0–100.0)
PLATELETS: 119 10*3/uL — AB (ref 150–400)
RBC: 3.77 MIL/uL — AB (ref 3.87–5.11)
RDW: 15.6 % — ABNORMAL HIGH (ref 11.5–15.5)
WBC: 12.7 10*3/uL — AB (ref 4.0–10.5)

## 2016-05-05 MED ORDER — FUROSEMIDE 10 MG/ML IJ SOLN
20.0000 mg | Freq: Two times a day (BID) | INTRAMUSCULAR | Status: DC
  Administered 2016-05-05 – 2016-05-07 (×5): 20 mg via INTRAVENOUS
  Filled 2016-05-05 (×5): qty 2

## 2016-05-05 MED ORDER — POTASSIUM CHLORIDE CRYS ER 10 MEQ PO TBCR
10.0000 meq | EXTENDED_RELEASE_TABLET | Freq: Once | ORAL | Status: AC
  Administered 2016-05-05: 10 meq via ORAL
  Filled 2016-05-05: qty 1

## 2016-05-05 NOTE — Progress Notes (Addendum)
TCTS DAILY ICU PROGRESS NOTE                   [address]            [place] [address]          [phone]   2 Days Post-Op Procedure(s) (LRB): AORTIC VALVE REPLACEMENT (AVR) WITH MAGNA EASE PERICARDIAL BIOPROSTHESIS - AORTIC 21 MM MODEL 3300TFX (N/A) TRANSESOPHAGEAL ECHOCARDIOGRAM (TEE) (N/A)  Total Length of Stay:  LOS: 2 days   Subjective: Patient in chair. She has nausea this am.  Objective: Vital signs in last 24 hours: Temp:  [97.3 F (36.3 C)-99.4 F (37.4 C)] 98.2 F (36.8 C) (02/22 0740) Pulse Rate:  [65-125] 74 (02/22 0700) Cardiac Rhythm: Normal sinus rhythm (02/22 0726) Resp:  [11-24] 20 (02/22 0700) BP: (100-143)/(65-100) 107/95 (02/22 0700) SpO2:  [91 %-100 %] 95 % (02/22 0700) Arterial Line BP: (107-132)/(56-74) 132/74 (02/21 1400) Weight:  [155 lb 8 oz (70.5 kg)] 155 lb 8 oz (70.5 kg) (02/22 0500)  Filed Weights   05/03/16 1300 05/04/16 0500 05/05/16 0500  Weight: 143 lb 1.6 oz (64.9 kg) 157 lb 13.6 oz (71.6 kg) 155 lb 8 oz (70.5 kg)    Weight change: 12 lb 6.4 oz (5.624 kg)   Hemodynamic parameters for last 24 hours: PAP: (21-24)/(14-20) 24/20  Intake/Output from previous day: 02/21 0701 - 02/22 0700 In: 1367.7 [P.O.:30; I.V.:1207.7; IV Piggyback:100] Out: 1440 [Urine:1440]  Intake/Output this shift: No intake/output data recorded.  Current Meds: Scheduled Meds: . acetaminophen  1,000 mg Oral Q6H   Or  . acetaminophen (TYLENOL) oral liquid 160 mg/5 mL  1,000 mg Per Tube Q6H  . amphetamine-dextroamphetamine  20 mg Oral Daily  . aspirin EC  325 mg Oral Daily   Or  . aspirin  324 mg Per Tube Daily  . bisacodyl  10 mg Oral Daily   Or  . bisacodyl  10 mg Rectal Daily  . docusate sodium  200 mg Oral Daily  . enoxaparin (LOVENOX) injection  30 mg Subcutaneous Q24H  . insulin aspart  0-24 Units Subcutaneous Q4H  . mouth rinse  15 mL Mouth Rinse BID  . metoprolol tartrate  12.5 mg Oral BID   Or  . metoprolol tartrate   12.5 mg Per Tube BID  . pantoprazole  40 mg Oral Daily  . progesterone  100 mg Oral QHS  . sodium chloride flush  3 mL Intravenous Q12H   Continuous Infusions: . sodium chloride Stopped (05/04/16 1000)  . sodium chloride 250 mL (05/04/16 0617)  . sodium chloride 20 mL/hr at 05/04/16 0600  . amiodarone 30 mg/hr (05/05/16 0700)  . dexmedetomidine Stopped (05/03/16 1620)  . lactated ringers Stopped (05/04/16 2200)  . lactated ringers 20 mL/hr at 05/05/16 0700  . milrinone 0.098 mcg/kg/min (05/05/16 0700)  . nitroGLYCERIN    . phenylephrine (NEO-SYNEPHRINE) Adult infusion Stopped (05/04/16 0800)   PRN Meds:.sodium chloride, lactated ringers, LORazepam, metoprolol, midazolam, morphine injection, ondansetron (ZOFRAN) IV, promethazine, sodium chloride flush, traMADol  General appearance: alert, cooperative and no distress Neurologic: intact Heart: RRR Lungs: Diminshed at bases bilaterally Abdomen: soft, non-tender; bowel sounds normal; no masses,  no organomegaly Extremities: Mild LE edema Wound: Aquacel intact  Lab Results: CBC:  Recent Labs  05/04/16 1645 05/05/16 0430  WBC 11.8* 12.7*  HGB 11.0* 11.1*  HCT 34.0* 34.8*  PLT 113* 119*   BMET:   Recent Labs  05/04/16 0413 05/04/16 1642 05/04/16 1645 05/05/16 0430  NA 135 136  --  133*  K 4.5 4.8  --  4.5  CL 105 101  --  101  CO2 21*  --   --  25  GLUCOSE 112* 132*  --  141*  BUN 9 14  --  9  CREATININE 0.84 0.80 0.91 0.75  CALCIUM 7.4*  --   --  8.4*    CMET: Lab Results  Component Value Date   WBC 12.7 (H) 05/05/2016   HGB 11.1 (L) 05/05/2016   HCT 34.8 (L) 05/05/2016   PLT 119 (L) 05/05/2016   GLUCOSE 141 (H) 05/05/2016   CHOL 247 (H) 08/18/2015   TRIG 73 08/18/2015   HDL 108 08/18/2015   LDLCALC 124 08/18/2015   ALT 12 (L) 04/29/2016   AST 17 04/29/2016   NA 133 (L) 05/05/2016   K 4.5 05/05/2016   CL 101 05/05/2016   CREATININE 0.75 05/05/2016   BUN 9 05/05/2016   CO2 25 05/05/2016   TSH 1.39  08/18/2015   INR 1.40 05/03/2016   HGBA1C 5.4 04/29/2016      PT/INR:   Recent Labs  05/03/16 1300  LABPROT 17.3*  INR 1.40   Radiology: Dg Chest Port 1 View  Result Date: 05/05/2016 CLINICAL DATA:  Chest pain EXAM: PORTABLE CHEST 1 VIEW COMPARISON:  May 04, 2016 FINDINGS: Swan-Ganz catheter has been removed. Mediastinal drain has been removed. Cordis tip is in the superior cava. There is a small right apical pneumothorax, marginally smaller I day prior. No tension component. There is cardiomegaly with bilateral pleural effusions. There is atelectatic change in both lung bases. There is slight interstitial edema. There is mild pulmonary venous hypertension. Patient is status post aortic valve replacement. IMPRESSION: Persistent changes indicative of a degree of congestive heart failure. Small right apical pneumothorax, marginally smaller than on previous study. Stable cardiac enlargement. Note that a right pleural effusion may be marginally larger than on 1 day prior, although differences may be due to slight differences in patient positioning. Electronically Signed   By: Bretta Bang III M.D.   On: 05/05/2016 07:34     Assessment/Plan: S/P Procedure(s) (LRB): AORTIC VALVE REPLACEMENT (AVR) WITH MAGNA EASE PERICARDIAL BIOPROSTHESIS - AORTIC 21 MM MODEL 3300TFX (N/A) TRANSESOPHAGEAL ECHOCARDIOGRAM (TEE) (N/A)  1.CV-She went into a fib with RVR yesterday. SR in the 70's this am.  On Milrinone and Amiodarone drips, and Lopressor 12.5 mg bid. Will stop Milrinone drip this am, Amiodarone drip in am and start oral. 2. Pulmonary-. CXR this am shows tiny right apical pneumothorax, bibasilar atelectasis and pleural effusions R>L. Encourage incentive spirometer. 3. Volume Overload-Will give 20 mg IV bid 4. ABL anemia-H and H stable at 11.1 and 34.8 5. CBGs 116/123/126. Pre op HGA1C 5.4. Will stop accu checks and SS PRN upon transfer 6. Mild thrombocytopenia-platelets 119,000 7. Hope  to transfer to floor soon   Wanda [name] 05/05/2016 7:53 AM   Holding sinus now D/c milrinone today , with nausea will wait on converting to po Amiodarone I have seen and examined Wanda [name] and agree with the above assessment  and plan.  Wanda Ovens MD Beeper [phone] Office [phone] 05/05/2016 8:17 AM

## 2016-05-05 NOTE — Plan of Care (Signed)
Problem: Activity: Goal: Risk for activity intolerance will decrease Outcome: Progressing Pt is able to get up and down to chair and ambulate around halls without difficulty.  Problem: Bowel/Gastric: Goal: Gastrointestinal status for postoperative course will improve Outcome: Progressing Pt had a bowel movement today  Problem: Respiratory: Goal: Levels of oxygenation will improve Outcome: Progressing Pt is currently tolerating room air well, with oxygen saturation in the mid to high 90s.

## 2016-05-05 NOTE — Progress Notes (Signed)
TCTS BRIEF SICU PROGRESS NOTE  2 Days Post-Op  S/P Procedure(s) (LRB): AORTIC VALVE REPLACEMENT (AVR) WITH MAGNA EASE PERICARDIAL BIOPROSTHESIS - AORTIC 21 MM MODEL 3300TFX (N/A) TRANSESOPHAGEAL ECHOCARDIOGRAM (TEE) (N/A)   Stable day NSR w/ stable BP Breathing comfortably w/ O2 sats 96% on RA UOP adequate  Plan: Continue current plan  Purcell Nails, MD 05/05/2016 6:10 PM

## 2016-05-06 ENCOUNTER — Inpatient Hospital Stay (HOSPITAL_COMMUNITY): Payer: BC Managed Care – PPO

## 2016-05-06 ENCOUNTER — Encounter (HOSPITAL_COMMUNITY): Payer: Self-pay | Admitting: *Deleted

## 2016-05-06 DIAGNOSIS — I9789 Other postprocedural complications and disorders of the circulatory system, not elsewhere classified: Secondary | ICD-10-CM

## 2016-05-06 DIAGNOSIS — I480 Paroxysmal atrial fibrillation: Secondary | ICD-10-CM

## 2016-05-06 LAB — BASIC METABOLIC PANEL
Anion gap: 6 (ref 5–15)
BUN: 10 mg/dL (ref 6–20)
CO2: 28 mmol/L (ref 22–32)
Calcium: 8.2 mg/dL — ABNORMAL LOW (ref 8.9–10.3)
Chloride: 101 mmol/L (ref 101–111)
Creatinine, Ser: 0.82 mg/dL (ref 0.44–1.00)
GFR calc Af Amer: >60 mL/min (ref >60)
GFR calc non Af Amer: >60 mL/min (ref >60)
Glucose, Bld: 102 mg/dL — ABNORMAL HIGH (ref 65–99)
Potassium: 3.7 mmol/L (ref 3.5–5.1)
Sodium: 135 mmol/L (ref 135–145)

## 2016-05-06 LAB — GLUCOSE, CAPILLARY
Glucose-Capillary: 128 mg/dL — ABNORMAL HIGH (ref 65–99)
Glucose-Capillary: 62 mg/dL — ABNORMAL LOW (ref 65–99)
Glucose-Capillary: 90 mg/dL (ref 65–99)
Glucose-Capillary: 90 mg/dL (ref 65–99)
Glucose-Capillary: 91 mg/dL (ref 65–99)
Glucose-Capillary: 98 mg/dL (ref 65–99)

## 2016-05-06 LAB — CBC
HEMATOCRIT: 31.5 % — AB (ref 36.0–46.0)
HEMOGLOBIN: 10.1 g/dL — AB (ref 12.0–15.0)
MCH: 29.5 pg (ref 26.0–34.0)
MCHC: 32.1 g/dL (ref 30.0–36.0)
MCV: 92.1 fL (ref 78.0–100.0)
Platelets: 108 10*3/uL — ABNORMAL LOW (ref 150–400)
RBC: 3.42 MIL/uL — AB (ref 3.87–5.11)
RDW: 15.2 % (ref 11.5–15.5)
WBC: 7.9 10*3/uL (ref 4.0–10.5)

## 2016-05-06 LAB — COOXEMETRY PANEL
Carboxyhemoglobin: 0.9 % (ref 0.5–1.5)
Methemoglobin: 1.3 % (ref 0.0–1.5)
O2 Saturation: 84.1 %
Total hemoglobin: 10.3 g/dL — ABNORMAL LOW (ref 12.0–16.0)

## 2016-05-06 MED ORDER — AMIODARONE LOAD VIA INFUSION
150.0000 mg | Freq: Once | INTRAVENOUS | Status: AC
  Administered 2016-05-06: 150 mg via INTRAVENOUS

## 2016-05-06 MED ORDER — WARFARIN SODIUM 2.5 MG PO TABS
2.5000 mg | ORAL_TABLET | Freq: Every day | ORAL | Status: DC
  Administered 2016-05-06: 2.5 mg via ORAL
  Filled 2016-05-06: qty 1

## 2016-05-06 MED ORDER — METOPROLOL TARTRATE 25 MG PO TABS
25.0000 mg | ORAL_TABLET | Freq: Three times a day (TID) | ORAL | Status: DC
  Administered 2016-05-06 – 2016-05-09 (×8): 25 mg via ORAL
  Filled 2016-05-06 (×12): qty 1

## 2016-05-06 MED ORDER — SODIUM CHLORIDE 0.9 % IV SOLN
30.0000 meq | Freq: Once | INTRAVENOUS | Status: AC
  Administered 2016-05-06: 30 meq via INTRAVENOUS
  Filled 2016-05-06: qty 15

## 2016-05-06 MED ORDER — WARFARIN - PHYSICIAN DOSING INPATIENT
Freq: Every day | Status: DC
  Administered 2016-05-06: 18:00:00

## 2016-05-06 MED ORDER — METOPROLOL TARTRATE 25 MG PO TABS: 25.0000 mg | ORAL_TABLET | Freq: Two times a day (BID) | ORAL | Status: DC

## 2016-05-06 NOTE — Consult Note (Signed)
Date: 05/06/2016               Patient Name:  Wanda [name] MRN: [id number]  DOB: Nov 10, 1950 Age / Sex: 66 y.o., female   PCP: [name] [place], MD         Requesting Physician: Dr. [name], MD    Consulting Reason:  A. fib      History of Present Illness: Patient is 66 year old female with a past medical history of bicuspid aortic valve and severe aortic regurgitation and underwent aortic valve replacement by cardiothoracic surgery on 05/31/2016. She went into A. fib with RVR after the surgery and was started on milrinone drip, amiodarone drip, and Lopressor 12.5 mg twice daily. She initially converted to normal sinus rhythm but is back into A. fib today. Currently on amiodarone drip and Lopressor.  Cardiology has been consulted for further evaluation of the A. fib.Patient reports having occasional palpitations since the surgery. Reports having dyspnea with exertion for the past 1 year. States she "blacked out" during a blood draw last week. States the pain at the sternotomy site is improving over time. Reports feeling weak since the surgery. Denies having any orthopnea or lower extremity edema.  Patient was seen on 01/18/2016 by cardiology for an abnormal EKG and a newly diagnosed heart murmur. She is noted to have ventricular bigeminy on EKG. She had a heart murmur suggestive of aortic insufficiency and echocardiogram was ordered. Echo done 02/08/2016 demonstrated left ventricular ejection fraction 40-45%, grade 2 diastolic dysfunction, mild aortic stenosis, and severe aortic regurgitation. Also demonstrated mild mitral regurgitation and mild left atrial dilation. Holter monitor in 01/2016 demonstrated frequent PVCs and periods of ventricular bigeminy. R heart cath done 04/05/16 showing severe aortic regurgitation, widely patent coronary arteries with minimal irregularity of the LAD at the origin of the first diagonal, and normal right heart hemodynamics. TSH last checked in June 2017 was  normal.  Meds: Current Facility-Administered Medications  Medication Dose Route Frequency Provider Last Rate Last Dose  . 0.9 %  sodium chloride infusion  250 mL Intravenous Continuous Ardelle Balls, PA-C 1 mL/hr at 05/04/16 0617 250 mL at 05/04/16 0617  . acetaminophen (TYLENOL) tablet 1,000 mg  1,000 mg Oral Q6H Donielle Margaretann Loveless, PA-C   1,000 mg at 05/06/16 0600   Or  . acetaminophen (TYLENOL) solution 1,000 mg  1,000 mg Per Tube Q6H Donielle Margaretann Loveless, PA-C      . amiodarone (NEXTERONE PREMIX) 360-4.14 MG/200ML-% (1.8 mg/mL) IV infusion  30 mg/hr Intravenous Continuous [name], MD 16.7 mL/hr at 05/06/16 0900 30 mg/hr at 05/06/16 0900  . amphetamine-dextroamphetamine (ADDERALL) tablet 20 mg  20 mg Oral Daily [name], MD   20 mg at 05/05/16 0920  . aspirin EC tablet 325 mg  325 mg Oral Daily Ardelle Balls, PA-C   325 mg at 05/06/16 4098   Or  . aspirin chewable tablet 324 mg  324 mg Per Tube Daily Donielle Margaretann Loveless, PA-C      . bisacodyl (DULCOLAX) EC tablet 10 mg  10 mg Oral Daily Ardelle Balls, PA-C   10 mg at 05/06/16 1191   Or  . bisacodyl (DULCOLAX) suppository 10 mg  10 mg Rectal Daily Donielle Margaretann Loveless, PA-C      . dexmedetomidine (PRECEDEX) 200 MCG/50ML (4 mcg/mL) infusion  0-0.7 mcg/kg/hr Intravenous Continuous Ardelle Balls, PA-C   Stopped at 05/03/16 1620  . docusate sodium (COLACE) capsule 200 mg  200 mg  Oral Daily Ardelle Balls, PA-C   200 mg at 05/06/16 1610  . enoxaparin (LOVENOX) injection 30 mg  30 mg Subcutaneous Q24H [name], MD   30 mg at 05/05/16 1702  . furosemide (LASIX) injection 20 mg  20 mg Intravenous BID Ardelle Balls, PA-C   20 mg at 05/06/16 0827  . insulin aspart (novoLOG) injection 0-24 Units  0-24 Units Subcutaneous Q4H Ardelle Balls, PA-C   2 Units at 05/06/16 9604  . lactated ringers infusion 500 mL  500 mL Intravenous Once PRN Ardelle Balls, PA-C      .  lactated ringers infusion   Intravenous Continuous Ardelle Balls, PA-C   Stopped at 05/04/16 2200  . lactated ringers infusion   Intravenous Continuous Ardelle Balls, PA-C 20 mL/hr at 05/06/16 0600    . LORazepam (ATIVAN) tablet 0.5 mg  0.5 mg Oral Daily PRN Ardelle Balls, PA-C      . metoprolol (LOPRESSOR) injection 2.5-5 mg  2.5-5 mg Intravenous Q2H PRN Ardelle Balls, PA-C      . metoprolol tartrate (LOPRESSOR) tablet 12.5 mg  12.5 mg Oral BID Ardelle Balls, PA-C   12.5 mg at 05/06/16 5409   Or  . metoprolol tartrate (LOPRESSOR) 25 mg/10 mL oral suspension 12.5 mg  12.5 mg Per Tube BID Ardelle Balls, PA-C      . midazolam (VERSED) injection 2 mg  2 mg Intravenous Q1H PRN Ardelle Balls, PA-C   2 mg at 05/03/16 1335  . morphine 2 MG/ML injection 2-5 mg  2-5 mg Intravenous Q1H PRN Ardelle Balls, PA-C   2 mg at 05/04/16 1655  . nitroGLYCERIN 50 mg in dextrose 5 % 250 mL (0.2 mg/mL) infusion  0-100 mcg/min Intravenous Titrated Donielle Margaretann Loveless, PA-C      . ondansetron [facility]) injection 4 mg  4 mg Intravenous Q6H PRN Ardelle Balls, PA-C   4 mg at 05/04/16 2242  . pantoprazole (PROTONIX) EC tablet 40 mg  40 mg Oral Daily Ardelle Balls, PA-C   40 mg at 05/06/16 8119  . phenylephrine (NEO-SYNEPHRINE) 20 mg in sodium chloride 0.9 % 250 mL (0.08 mg/mL) infusion  0-100 mcg/min Intravenous Titrated Ardelle Balls, PA-C   Stopped at 05/04/16 0800  . progesterone (PROMETRIUM) capsule 100 mg  100 mg Oral QHS Ardelle Balls, PA-C   100 mg at 05/05/16 2200  . promethazine (PHENERGAN) injection 6.25 mg  6.25 mg Intravenous Q6H PRN [name], MD   6.25 mg at 05/05/16 1937  . sodium chloride flush (NS) 0.9 % injection 3 mL  3 mL Intravenous Q12H Donielle Margaretann Loveless, PA-C   3 mL at 05/05/16 2200  . sodium chloride flush (NS) 0.9 % injection 3 mL  3 mL Intravenous PRN Ardelle Balls, PA-C   3 mL at 05/06/16 0830  .  traMADol (ULTRAM) tablet 50-100 mg  50-100 mg Oral Q4H PRN Ardelle Balls, PA-C   100 mg at 05/06/16 1478    Allergies: Allergies as of 04/07/2016 - Review Complete 04/07/2016  Allergen Reaction Noted  . Codeine Nausea And Vomiting 05/27/2013   Past Medical History:  Diagnosis Date  . Anxiety   . Aortic regurgitation   . Aortic stenosis   . Arthritis   . Depression   . Dyspnea   . Migraine    Past Surgical History:  Procedure Laterality Date  . AORTIC VALVE REPLACEMENT N/A 05/03/2016   Procedure:  AORTIC VALVE REPLACEMENT (AVR) WITH MAGNA EASE PERICARDIAL BIOPROSTHESIS - AORTIC 21 MM MODEL 3300TFX;  Surgeon: [name], MD;  Location: [facility] OR;  Service: Open Heart Surgery;  Laterality: N/A;  . TEE WITHOUT CARDIOVERSION N/A 05/03/2016   Procedure: TRANSESOPHAGEAL ECHOCARDIOGRAM (TEE);  Surgeon: [name], MD;  Location: [facility] OR;  Service: Open Heart Surgery;  Laterality: N/A;  . TONSILLECTOMY AND ADENOIDECTOMY     Family History  Problem Relation Age of Onset  . Diabetes Mother   . Ovarian cancer Maternal Grandmother 80  . Breast cancer Sister 98  . Congestive Heart Failure Maternal Grandfather   . Heart disease Brother     valve replacement  . Deep vein thrombosis Father   . Deep vein thrombosis Brother   . Deep vein thrombosis Paternal Aunt     x2  . Deep vein thrombosis Paternal Grandmother   . Lung cancer Brother    Social History   Social History  . Marital status: Married    Spouse name: N/A  . Number of children: N/A  . Years of education: N/A   Occupational History  . Not on file.   Social History Main Topics  . Smoking status: Former Games developer  . Smokeless tobacco: Never Used     Comment: in college  . Alcohol use Yes     Comment: 1-2 glasses of wine  . Drug use: No  . Sexual activity: Yes    Partners: Male    Birth control/ protection: Other-see comments, Post-menopausal     Comment: vasectomy   Other Topics Concern  . Not on file    Social History Narrative  . No narrative on file    Review of Systems: Pertinent positives mentioned in HPI. Remainder of all ROS negative.   Physical Exam: Blood pressure (!) 128/94, pulse 96, temperature 98.5 F (36.9 C), temperature source Oral, resp. rate 17, height 5\' 4"  (1.626 m), weight 152 lb 1.9 oz (69 kg), last menstrual period 08/12/2004, SpO2 96 %. Physical Exam  Constitutional: She is oriented to person, place, and time. She appears well-developed and well-nourished. No distress.  HENT:  Head: Normocephalic and atraumatic.  Eyes: EOM are normal.  Neck: Neck supple. No tracheal deviation present.  Cardiovascular: Intact distal pulses.  Exam reveals no gallop and no friction rub.   No murmur heard. Irregularly irregular rhythm  Pulmonary/Chest: Effort normal and breath sounds normal. No respiratory distress. She has no wheezes. She has no rales.  Dressing at the sternotomy site appears clean, dry, and intact.  Abdominal: Soft. Bowel sounds are normal. She exhibits no distension. There is no tenderness.  Musculoskeletal: She exhibits no edema.  Neurological: She is alert and oriented to person, place, and time.  Skin: Skin is warm and dry.    Lab results: Basic Metabolic Panel:  Recent Labs  61/44/31 0413  05/04/16 1645 05/05/16 0430 05/06/16 0355  NA 135  < >  --  133* 135  K 4.5  < >  --  4.5 3.7  CL 105  < >  --  101 101  CO2 21*  --   --  25 28  GLUCOSE 112*  < >  --  141* 102*  BUN 9  < >  --  9 10  CREATININE 0.84  < > 0.91 0.75 0.82  CALCIUM 7.4*  --   --  8.4* 8.2*  MG 2.3  --  2.5*  --   --   < > =  values in this interval not displayed.  CBC:  Recent Labs  05/05/16 0430 05/06/16 0355  WBC 12.7* 7.9  HGB 11.1* 10.1*  HCT 34.8* 31.5*  MCV 92.3 92.1  PLT 119* 108*   CBG:  Recent Labs  05/05/16 1224 05/05/16 1652 05/05/16 2037 05/06/16 0004 05/06/16 0355 05/06/16 0818  GLUCAP 121* 79 76 90 98 128*   Coagulation:  Recent  Labs  05/03/16 1300  LABPROT 17.3*  INR 1.40   Imaging results:  Dg Chest Port 1 View  Result Date: 05/06/2016 CLINICAL DATA:  Chest tube in place EXAM: PORTABLE CHEST 1 VIEW COMPARISON:  Yesterday FINDINGS: Right IJ sheath in stable position. Status post aortic valve replacement with normal heart size. Layering pleural effusions and atelectasis at the bases, mildly improved. Stable trace right apical pneumothorax. IMPRESSION: 1. Stable trace right apical pneumothorax. 2. Atelectasis and layering pleural effusions. Aeration is improved since yesterday. Electronically Signed   By: Marnee Spring M.D.   On: 05/06/2016 07:22   Dg Chest Port 1 View  Result Date: 05/05/2016 CLINICAL DATA:  Chest pain EXAM: PORTABLE CHEST 1 VIEW COMPARISON:  May 04, 2016 FINDINGS: Swan-Ganz catheter has been removed. Mediastinal drain has been removed. Cordis tip is in the superior cava. There is a small right apical pneumothorax, marginally smaller I day prior. No tension component. There is cardiomegaly with bilateral pleural effusions. There is atelectatic change in both lung bases. There is slight interstitial edema. There is mild pulmonary venous hypertension. Patient is status post aortic valve replacement. IMPRESSION: Persistent changes indicative of a degree of congestive heart failure. Small right apical pneumothorax, marginally smaller than on previous study. Stable cardiac enlargement. Note that a right pleural effusion may be marginally larger than on 1 day prior, although differences may be due to slight differences in patient positioning. Electronically Signed   By: Bretta Bang III M.D.   On: 05/05/2016 07:34    Other results: Telemetry: A. fib with heart rate in the 80s to low 100s.  Assessment, Plan, & Recommendations by Problem: Active Problems:   Aortic insufficiency  Paroxysmal A. Fib: CHA2DS2-VASc 2. Echo done 02/08/2016 showing mild left atrial dilation in the setting of mild mitral  regurgitation and severe aortic regurgitation. Her A. fib is likely in the setting of valvular heart disease. TSH checked in June 2017 was normal. Currently rate controlled with heart rate in the 80s to low 100s. -Continue amiodarone for rhythm control -Increase Lopressor to 25 mg twice a day -Patient will have to started on Warfarin for anticoagulation if she does not convert back to NSR in the next 24-48 hrs.   Volume overload: Echo done 02/08/2016 demonstrated left ventricular ejection fraction 40-45%, grade 2 diastolic dysfunction. Currently euvolemic on exam.  -Continue Lasix 20 mg twice daily  Signed: John Giovanni, MD 05/06/2016, 10:07 AM   Patient seen, examined. Available data reviewed. Agree with findings, assessment, and plan as outlined by Dr Loney Loh. The patient is known to me from the outpatient setting, followed for severe aortic insufficiency. She underwent bioprosthetic AVR 2/20 with a 21 mm [employer] pericardial tissue valve. Her post-operative course has been complicated by migraine headache and atrial fibrillation.   On exam she is alert, oriented, in NAD. Lungs CTA, heart irregular with a 2/6 SEM at the RUSB, no diastolic murmur. Abdomen is soft, NT. Extremities wtihout leg edema. There is 1+ bilateral hand edema. No rash.   Telemetry is reviewed. She converted from atrial fibrillation to sinus rhythm  after IV amiodarone earlier this week, but went back into atrial fibrillation at approximately 0600 today and remains in AF with heart rate in the 80-90's now.   Will increase oral metoprolol to 25 mg every 8 hours and would continue IV amiodarone for now. If she doesn't convert to sinus rhythm over the next 24 hours would consider initiation of warfarin. Will follow with you and discuss anticoagulation with Dr Tyrone Sage.   Tonny Bollman, M.D. 05/06/2016 12:33 PM

## 2016-05-06 NOTE — Progress Notes (Signed)
Patient ID: Wanda [name], female   DOB: 10/29/1950, 66 y.o.   MRN: [id number] EVENING ROUNDS NOTE :     [address]       [place] [address]             [phone]                 3 Days Post-Op Procedure(s) (LRB): AORTIC VALVE REPLACEMENT (AVR) WITH MAGNA EASE PERICARDIAL BIOPROSTHESIS - AORTIC 21 MM MODEL 3300TFX (N/A) TRANSESOPHAGEAL ECHOCARDIOGRAM (TEE) (N/A)  Total Length of Stay:  LOS: 3 days  BP (!) 84/62   Pulse 98   Temp 98.7 F (37.1 C) (Oral)   Resp 18   Ht 5\' 4"  (1.626 m)   Wt 152 lb 1.9 oz (69 kg)   LMP 08/12/2004   SpO2 99%   BMI 26.11 kg/m   .Intake/Output      02/22 0701 - 02/23 0700 02/23 0701 - 02/24 0700   P.O. 720    I.V. (mL/kg) 822.7 (11.9) 601.3 (8.7)   Other     IV Piggyback 315    Total Intake(mL/kg) 1857.7 (26.9) 601.3 (8.7)   Urine (mL/kg/hr) 2410 (1.5) 750 (0.9)   Stool 1 (0)    Chest Tube     Total Output 2411 750   Net -553.3 -148.7        Urine Occurrence  504 x     . sodium chloride 250 mL (05/04/16 0617)  . amiodarone 30 mg/hr (05/06/16 1556)  . dexmedetomidine Stopped (05/03/16 1620)  . lactated ringers Stopped (05/04/16 2200)  . lactated ringers 20 mL/hr at 05/06/16 0600  . nitroGLYCERIN    . phenylephrine (NEO-SYNEPHRINE) Adult infusion Stopped (05/04/16 0800)     Lab Results  Component Value Date   WBC 7.9 05/06/2016   HGB 10.1 (L) 05/06/2016   HCT 31.5 (L) 05/06/2016   PLT 108 (L) 05/06/2016   GLUCOSE 102 (H) 05/06/2016   CHOL 247 (H) 08/18/2015   TRIG 73 08/18/2015   HDL 108 08/18/2015   LDLCALC 124 08/18/2015   ALT 12 (L) 04/29/2016   AST 17 04/29/2016   NA 135 05/06/2016   K 3.7 05/06/2016   CL 101 05/06/2016   CREATININE 0.82 05/06/2016   BUN 10 05/06/2016   CO2 28 05/06/2016   TSH 1.39 08/18/2015   INR 1.40 05/03/2016   HGBA1C 5.4 04/29/2016   Still afib, rate slowed , feels better, walked around unit, waiting for stepdown bed now. Discussed with Dr Excell Seltzer Plan short term coumadin  started tonight   [name] MD  Beeper [phone] Office [phone] 05/06/2016 6:30 PM

## 2016-05-06 NOTE — Progress Notes (Signed)
Recheck: pt remains in atrial fibrillation. D/W Dr Tyrone Sage. We agree best to start anticoagulation with plans for short-term warfarin 6-8 weeks as long as she returns to sinus rhythm. Otherwise continue IV amio and oral metoprolol.  Tonny Bollman 05/06/2016 4:10 PM

## 2016-05-06 NOTE — Care Management Note (Signed)
Case Management Note  Patient Details  Name: MAKAIYA BARRACO MRN: [id number] Date of Birth: [birth year]  Subjective/Objective:       S/p AVR        Action/Plan:   PTA independent  from home.  CM attempted to assess pt however pt c/o nauseousness so CM will assess at a later time.    Expected Discharge Date:                  Expected Discharge Plan:     In-House Referral:     Discharge planning Services  CM Consult  Post Acute Care Choice:    Choice offered to:     DME Arranged:    DME Agency:     HH Arranged:    HH Agency:     Status of Service:  In process, will continue to follow  If discussed at Long Length of Stay Meetings, dates discussed:    Additional Comments: 05/06/2016 Pt is alert and oriented during assessment - confirmed that PTA completely independent.  Pt states her husband will be with her at discharge for however long recommended.  CM will continue to follow for discharge needs Cherylann Parr, RN 05/06/2016, 10:26 AM

## 2016-05-06 NOTE — Progress Notes (Signed)
TCTS DAILY ICU PROGRESS NOTE                   [address]            [place] [id number]          [phone]   3 Days Post-Op Procedure(s) (LRB): AORTIC VALVE REPLACEMENT (AVR) WITH MAGNA EASE PERICARDIAL BIOPROSTHESIS - AORTIC 21 MM MODEL 3300TFX (N/A) TRANSESOPHAGEAL ECHOCARDIOGRAM (TEE) (N/A)  Total Length of Stay:  LOS: 3 days   Subjective: Patient in chair. Nausea improved, back in afib 10-15 min ago 90-110  Objective: Vital signs in last 24 hours: Temp:  [97.4 F (36.3 C)-98.3 F (36.8 C)] 98 F (36.7 C) (02/23 0355) Pulse Rate:  [56-107] 107 (02/23 0700) Cardiac Rhythm: Normal sinus rhythm (02/22 2000) Resp:  [13-25] 17 (02/23 0700) BP: (97-153)/(70-104) 121/104 (02/23 0700) SpO2:  [89 %-100 %] 98 % (02/23 0700) Weight:  [152 lb 1.9 oz (69 kg)] 152 lb 1.9 oz (69 kg) (02/23 0500)  Filed Weights   05/04/16 0500 05/05/16 0500 05/06/16 0500  Weight: 157 lb 13.6 oz (71.6 kg) 155 lb 8 oz (70.5 kg) 152 lb 1.9 oz (69 kg)    Weight change: -3 lb 6.1 oz (-1.534 kg)   Hemodynamic parameters for last 24 hours:    Intake/Output from previous day: 02/22 0701 - 02/23 0700 In: 1821 [P.O.:720; I.V.:786; IV Piggyback:315] Out: 2411 [Urine:2410; Stool:1]  Intake/Output this shift: No intake/output data recorded.  Current Meds: Scheduled Meds: . acetaminophen  1,000 mg Oral Q6H   Or  . acetaminophen (TYLENOL) oral liquid 160 mg/5 mL  1,000 mg Per Tube Q6H  . amphetamine-dextroamphetamine  20 mg Oral Daily  . aspirin EC  325 mg Oral Daily   Or  . aspirin  324 mg Per Tube Daily  . bisacodyl  10 mg Oral Daily   Or  . bisacodyl  10 mg Rectal Daily  . docusate sodium  200 mg Oral Daily  . enoxaparin (LOVENOX) injection  30 mg Subcutaneous Q24H  . furosemide  20 mg Intravenous BID  . insulin aspart  0-24 Units Subcutaneous Q4H  . metoprolol tartrate  12.5 mg Oral BID   Or  . metoprolol tartrate  12.5 mg Per Tube BID  . pantoprazole  40 mg Oral Daily    . potassium chloride (KCL MULTIRUN) 30 mEq in 265 mL IVPB  30 mEq Intravenous Once  . progesterone  100 mg Oral QHS  . sodium chloride flush  3 mL Intravenous Q12H   Continuous Infusions: . sodium chloride Stopped (05/04/16 1000)  . sodium chloride 250 mL (05/04/16 0617)  . sodium chloride 20 mL/hr at 05/04/16 0600  . amiodarone 30 mg/hr (05/06/16 0600)  . dexmedetomidine Stopped (05/03/16 1620)  . lactated ringers Stopped (05/04/16 2200)  . lactated ringers 20 mL/hr at 05/06/16 0600  . nitroGLYCERIN    . phenylephrine (NEO-SYNEPHRINE) Adult infusion Stopped (05/04/16 0800)   PRN Meds:.sodium chloride, lactated ringers, LORazepam, metoprolol, midazolam, morphine injection, ondansetron (ZOFRAN) IV, promethazine, sodium chloride flush, traMADol  General appearance: alert, cooperative and no distress Neurologic: intact Heart: RRR Lungs: Diminshed at bases bilaterally Abdomen: soft, non-tender; bowel sounds normal; no masses,  no organomegaly Extremities: Mild LE edema Wound: Aquacel intact  Lab Results: CBC:  Recent Labs  05/05/16 0430 05/06/16 0355  WBC 12.7* 7.9  HGB 11.1* 10.1*  HCT 34.8* 31.5*  PLT 119* 108*   BMET:   Recent Labs  05/05/16 0430 05/06/16  0355  NA 133* 135  K 4.5 3.7  CL 101 101  CO2 25 28  GLUCOSE 141* 102*  BUN 9 10  CREATININE 0.75 0.82  CALCIUM 8.4* 8.2*    CMET: Lab Results  Component Value Date   WBC 7.9 05/06/2016   HGB 10.1 (L) 05/06/2016   HCT 31.5 (L) 05/06/2016   PLT 108 (L) 05/06/2016   GLUCOSE 102 (H) 05/06/2016   CHOL 247 (H) 08/18/2015   TRIG 73 08/18/2015   HDL 108 08/18/2015   LDLCALC 124 08/18/2015   ALT 12 (L) 04/29/2016   AST 17 04/29/2016   NA 135 05/06/2016   K 3.7 05/06/2016   CL 101 05/06/2016   CREATININE 0.82 05/06/2016   BUN 10 05/06/2016   CO2 28 05/06/2016   TSH 1.39 08/18/2015   INR 1.40 05/03/2016   HGBA1C 5.4 04/29/2016      PT/INR:   Recent Labs  05/03/16 1300  LABPROT 17.3*  INR  1.40   Radiology: Dg Chest Port 1 View  Result Date: 05/06/2016 CLINICAL DATA:  Chest tube in place EXAM: PORTABLE CHEST 1 VIEW COMPARISON:  Yesterday FINDINGS: Right IJ sheath in stable position. Status post aortic valve replacement with normal heart size. Layering pleural effusions and atelectasis at the bases, mildly improved. Stable trace right apical pneumothorax. IMPRESSION: 1. Stable trace right apical pneumothorax. 2. Atelectasis and layering pleural effusions. Aeration is improved since yesterday. Electronically Signed   By: Marnee Spring M.D.   On: 05/06/2016 07:22     Assessment/Plan: S/P Procedure(s) (LRB): AORTIC VALVE REPLACEMENT (AVR) WITH MAGNA EASE PERICARDIAL BIOPROSTHESIS - AORTIC 21 MM MODEL 3300TFX (N/A) TRANSESOPHAGEAL ECHOCARDIOGRAM (TEE) (N/A)  1.CV-Sinus yesterday now back in AF. Marland Kitchen  On  Amiodarone drips, and Lopressor 12.5 mg bid.   2. Pulmonary-. CXR this am shows tiny right apical pneumothorax, bibasilar atelectasis and pleural effusions R>L. Encourage incentive spirometer. 3. Volume Overload-Will give 20 mg IV bid 4. ABL anemia-H and H stable at 11.1 and 34.8  6. Mild thrombocytopenia-platelets 108,000 7. Hope to transfer to floor soon when af ib stable   bolus Cordarone this am, will ask cardiology to see   [name] [facility] 05/06/2016 7:43 AM

## 2016-05-07 ENCOUNTER — Inpatient Hospital Stay (HOSPITAL_COMMUNITY): Payer: BC Managed Care – PPO

## 2016-05-07 DIAGNOSIS — I351 Nonrheumatic aortic (valve) insufficiency: Secondary | ICD-10-CM

## 2016-05-07 LAB — TYPE AND SCREEN
Blood Product Expiration Date: 201803132359
Blood Product Expiration Date: 201803142359
Blood Product Expiration Date: 201803092359
Blood Product Expiration Date: 201803092359
Blood Product Expiration Date: 201803102359
Blood Product Expiration Date: 201803102359
ISSUE DATE / TIME: 201802200849
ISSUE DATE / TIME: 201802200849
ISSUE DATE / TIME: 201802122249
ISSUE DATE / TIME: 201802141842
Unit Type and Rh: 600
Unit Type and Rh: 600
Unit Type and Rh: 6200
Unit Type and Rh: 6200
Unit Type and Rh: 6200
Unit Type and Rh: 6200

## 2016-05-07 LAB — COOXEMETRY PANEL
Carboxyhemoglobin: 1.3 % (ref 0.5–1.5)
Methemoglobin: 0.7 % (ref 0.0–1.5)
O2 Saturation: 67 %
Total hemoglobin: 12 g/dL (ref 12.0–16.0)

## 2016-05-07 LAB — BASIC METABOLIC PANEL
Anion gap: 6 (ref 5–15)
BUN: 12 mg/dL (ref 6–20)
CO2: 31 mmol/L (ref 22–32)
Calcium: 8 mg/dL — ABNORMAL LOW (ref 8.9–10.3)
Chloride: 101 mmol/L (ref 101–111)
Creatinine, Ser: 0.87 mg/dL (ref 0.44–1.00)
GFR calc Af Amer: >60 mL/min (ref >60)
GFR calc non Af Amer: >60 mL/min (ref >60)
Glucose, Bld: 97 mg/dL (ref 65–99)
Potassium: 3.8 mmol/L (ref 3.5–5.1)
Sodium: 138 mmol/L (ref 135–145)

## 2016-05-07 LAB — CBC
HCT: 30.9 % — ABNORMAL LOW (ref 36.0–46.0)
Hemoglobin: 9.9 g/dL — ABNORMAL LOW (ref 12.0–15.0)
MCH: 30 pg (ref 26.0–34.0)
MCHC: 32 g/dL (ref 30.0–36.0)
MCV: 93.6 fL (ref 78.0–100.0)
Platelets: 140 10*3/uL — ABNORMAL LOW (ref 150–400)
RBC: 3.3 MIL/uL — ABNORMAL LOW (ref 3.87–5.11)
RDW: 15.2 % (ref 11.5–15.5)
WBC: 6.9 10*3/uL (ref 4.0–10.5)

## 2016-05-07 LAB — PROTIME-INR
INR: 1.03
Prothrombin Time: 13.5 seconds (ref 11.4–15.2)

## 2016-05-07 MED ORDER — SODIUM CHLORIDE 0.9% FLUSH
3.0000 mL | Freq: Two times a day (BID) | INTRAVENOUS | Status: DC
  Administered 2016-05-08 – 2016-05-09 (×2): 3 mL via INTRAVENOUS

## 2016-05-07 MED ORDER — MOVING RIGHT ALONG BOOK
Freq: Once | Status: DC
  Filled 2016-05-07: qty 1

## 2016-05-07 MED ORDER — SODIUM CHLORIDE 0.9% FLUSH: 3.0000 mL | INTRAVENOUS | Status: DC | PRN

## 2016-05-07 MED ORDER — WARFARIN SODIUM 2.5 MG PO TABS
2.5000 mg | ORAL_TABLET | Freq: Every day | ORAL | Status: AC
  Administered 2016-05-07: 2.5 mg via ORAL
  Filled 2016-05-07: qty 1

## 2016-05-07 MED ORDER — COUMADIN BOOK
Freq: Once | Status: AC
  Administered 2016-05-07: 13:00:00
  Filled 2016-05-07: qty 1

## 2016-05-07 MED ORDER — POTASSIUM CHLORIDE CRYS ER 20 MEQ PO TBCR
20.0000 meq | EXTENDED_RELEASE_TABLET | Freq: Every day | ORAL | Status: DC
  Administered 2016-05-08 – 2016-05-10 (×3): 20 meq via ORAL
  Filled 2016-05-07 (×3): qty 1

## 2016-05-07 MED ORDER — AMIODARONE HCL 200 MG PO TABS: 200.0000 mg | ORAL_TABLET | Freq: Two times a day (BID) | ORAL | Status: DC

## 2016-05-07 MED ORDER — AMIODARONE HCL 200 MG PO TABS
400.0000 mg | ORAL_TABLET | Freq: Two times a day (BID) | ORAL | Status: DC
  Administered 2016-05-07 – 2016-05-10 (×7): 400 mg via ORAL
  Filled 2016-05-07 (×7): qty 2

## 2016-05-07 MED ORDER — ASPIRIN EC 81 MG PO TBEC
81.0000 mg | DELAYED_RELEASE_TABLET | Freq: Every day | ORAL | Status: DC
  Administered 2016-05-08 – 2016-05-10 (×3): 81 mg via ORAL
  Filled 2016-05-07 (×3): qty 1

## 2016-05-07 MED ORDER — WARFARIN SODIUM 5 MG PO TABS
5.0000 mg | ORAL_TABLET | Freq: Every day | ORAL | Status: DC
Start: 2016-05-07 — End: 2016-05-07

## 2016-05-07 MED ORDER — SODIUM CHLORIDE 0.9 % IV SOLN: 250.0000 mL | INTRAVENOUS | Status: DC | PRN

## 2016-05-07 MED ORDER — FUROSEMIDE 40 MG PO TABS
40.0000 mg | ORAL_TABLET | Freq: Every day | ORAL | Status: DC
  Administered 2016-05-07 – 2016-05-10 (×4): 40 mg via ORAL
  Filled 2016-05-07 (×4): qty 1

## 2016-05-07 MED ORDER — WARFARIN SODIUM 5 MG PO TABS: 5.0000 mg | ORAL_TABLET | Freq: Once | ORAL | Status: DC

## 2016-05-07 NOTE — Progress Notes (Addendum)
TCTS DAILY ICU PROGRESS NOTE                   [address]            [place] [address]          [phone]   4 Days Post-Op Procedure(s) (LRB): AORTIC VALVE REPLACEMENT (AVR) WITH MAGNA EASE PERICARDIAL BIOPROSTHESIS - AORTIC 21 MM MODEL 3300TFX (N/A) TRANSESOPHAGEAL ECHOCARDIOGRAM (TEE) (N/A)  Total Length of Stay:  LOS: 4 days   Subjective: Patient still with nausea.  Objective: Vital signs in last 24 hours: Temp:  [97.7 F (36.5 C)-98.7 F (37.1 C)] 98.1 F (36.7 C) (02/24 0800) Pulse Rate:  [55-98] 55 (02/24 1100) Cardiac Rhythm: Atrial fibrillation (02/24 0800) Resp:  [10-22] 19 (02/24 1100) BP: (81-125)/(58-84) 118/82 (02/24 1100) SpO2:  [87 %-99 %] 93 % (02/24 1100) Weight:  [150 lb 11.2 oz (68.4 kg)] 150 lb 11.2 oz (68.4 kg) (02/24 0800)  Filed Weights   05/05/16 0500 05/06/16 0500 05/07/16 0800  Weight: 155 lb 8 oz (70.5 kg) 152 lb 1.9 oz (69 kg) 150 lb 11.2 oz (68.4 kg)     Intake/Output from previous day: 02/23 0701 - 02/24 0700 In: 1855.1 [P.O.:600; I.V.:1255.1] Out: 1725 [Urine:1725]  Intake/Output this shift: Total I/O In: 347 [P.O.:240; I.V.:107] Out: 400 [Urine:400]  Current Meds: Scheduled Meds: . acetaminophen  1,000 mg Oral Q6H   Or  . acetaminophen (TYLENOL) oral liquid 160 mg/5 mL  1,000 mg Per Tube Q6H  . [START ON 05/14/2016] amiodarone  200 mg Oral BID  . amiodarone  400 mg Oral BID  . amphetamine-dextroamphetamine  20 mg Oral Daily  . aspirin EC  325 mg Oral Daily   Or  . aspirin  324 mg Per Tube Daily  . bisacodyl  10 mg Oral Daily   Or  . bisacodyl  10 mg Rectal Daily  . docusate sodium  200 mg Oral Daily  . enoxaparin (LOVENOX) injection  30 mg Subcutaneous Q24H  . furosemide  20 mg Intravenous BID  . metoprolol tartrate  25 mg Oral Q8H  . pantoprazole  40 mg Oral Daily  . progesterone  100 mg Oral QHS  . sodium chloride flush  3 mL Intravenous Q12H  . warfarin  5 mg Oral q1800  . Warfarin - Physician  Dosing Inpatient   Does not apply q1800   Continuous Infusions: . sodium chloride 250 mL (05/04/16 0617)  . lactated ringers Stopped (05/07/16 0955)  . nitroGLYCERIN    . phenylephrine (NEO-SYNEPHRINE) Adult infusion Stopped (05/04/16 0800)   PRN Meds:.LORazepam, metoprolol, morphine injection, ondansetron (ZOFRAN) IV, promethazine, sodium chloride flush, traMADol  General appearance: alert, cooperative and no distress Neurologic: intact Heart: RRR Lungs: Diminshed at bases bilaterally Abdomen: soft, non-tender; bowel sounds normal; no masses,  no organomegaly Extremities: Mild LE edema Wound: Aquacel intact  Lab Results: CBC:  Recent Labs  05/06/16 0355 05/07/16 0345  WBC 7.9 6.9  HGB 10.1* 9.9*  HCT 31.5* 30.9*  PLT 108* 140*   BMET:   Recent Labs  05/06/16 0355 05/07/16 0345  NA 135 138  K 3.7 3.8  CL 101 101  CO2 28 31  GLUCOSE 102* 97  BUN 10 12  CREATININE 0.82 0.87  CALCIUM 8.2* 8.0*    CMET: Lab Results  Component Value Date   WBC 6.9 05/07/2016   HGB 9.9 (L) 05/07/2016   HCT 30.9 (L) 05/07/2016   PLT 140 (L) 05/07/2016  GLUCOSE 97 05/07/2016   CHOL 247 (H) 08/18/2015   TRIG 73 08/18/2015   HDL 108 08/18/2015   LDLCALC 124 08/18/2015   ALT 12 (L) 04/29/2016   AST 17 04/29/2016   NA 138 05/07/2016   K 3.8 05/07/2016   CL 101 05/07/2016   CREATININE 0.87 05/07/2016   BUN 12 05/07/2016   CO2 31 05/07/2016   TSH 1.39 08/18/2015   INR 1.03 05/07/2016   HGBA1C 5.4 04/29/2016      PT/INR:   Recent Labs  05/07/16 0345  LABPROT 13.5  INR 1.03   Radiology: Dg Chest Port 1 View  Result Date: 05/07/2016 CLINICAL DATA:  Aortic valve replacement. EXAM: PORTABLE CHEST 1 VIEW COMPARISON:  05/06/2016 and prior radiographs FINDINGS: Cardiomegaly, aortic valve replacement and right IJ central venous catheter sheath again noted. Improved bibasilar aeration with continued left lower lung atelectasis/ consolidation and small effusion. No definite  pneumothorax is noted. No other changes identified. IMPRESSION: Improved bibasilar aeration with continued left lower lung atelectasis/ consolidation and small effusion. No definite pneumothorax. Electronically Signed   By: Harmon Pier M.D.   On: 05/07/2016 07:49     Assessment/Plan: S/P Procedure(s) (LRB): AORTIC VALVE REPLACEMENT (AVR) WITH MAGNA EASE PERICARDIAL BIOPROSTHESIS - AORTIC 21 MM MODEL 3300TFX (N/A) TRANSESOPHAGEAL ECHOCARDIOGRAM (TEE) (N/A)  1.CV-PAF.  On Milrinone and Amiodarone 400 mg bid, Lopressor 12.5 mg bid, and Coumadin. INR 1.03. Continue with 2.5 mg Coumadin for now. 2. Pulmonary- On 1 liter of oxygen via [place]. CXR this am shows cardiomegaly, bibasilar atelectasis/effusions, no pneumothorax. Encourage incentive spirometer. 3. Volume Overload-On Lasix 20 mg IV bid 4. ABL anemia-H and H 9.9 and 30.9 5. Mild thrombocytopenia-platelets 140,000 6. Supplement potassium 7. Patient has nausea post op. Will have to monitor closely to make sure Amiodarone does not make it worse. 8. Will transfer to floor  ZIMMERMAN,DONIELLE M PA-C 05/07/2016 11:47 AM   Chest xray improved  Started on coumadin To step down  I hold on iv  Cordarone to avoid  Vein irritation, no central line  I have seen and examined Luanna Cole Leary and agree with the above assessment  and plan.  [name] MD [facility] [phone] Office [phone] 05/07/2016 1:47 PM

## 2016-05-07 NOTE — Progress Notes (Signed)
Patient ambulated to new room #2w22. Husband at bedside. All belongings and SCDs transferred with patient. SCDs placed on bed. Receiving RN and NT at bedside.

## 2016-05-07 NOTE — Progress Notes (Signed)
Advanced Heart Failure Rounding Note   Subjective:    Sitting up in chair. Feels better. Remains in AF on amio. Rates 60-85. Walked 3 laps yesterday.   Co-ox 67%. Diuresing with IV lasix  Denies SOB. Seems down.    Objective:   Weight Range:  Vital Signs:   Temp:  [96 F (35.6 C)-98.7 F (37.1 C)] 98.1 F (36.7 C) (02/24 0800) Pulse Rate:  [60-98] 67 (02/24 0800) Resp:  [10-22] 17 (02/24 0800) BP: (81-125)/(58-84) 101/61 (02/24 0800) SpO2:  [87 %-99 %] 95 % (02/24 0800) Weight:  [68.4 kg (150 lb 11.2 oz)] 68.4 kg (150 lb 11.2 oz) (02/24 0800) Last BM Date: 05/05/16  Weight change: Filed Weights   05/05/16 0500 05/06/16 0500 05/07/16 0800  Weight: 70.5 kg (155 lb 8 oz) 69 kg (152 lb 1.9 oz) 68.4 kg (150 lb 11.2 oz)    Intake/Output:   Intake/Output Summary (Last 24 hours) at 05/07/16 0903 Last data filed at 05/07/16 0800  Gross per 24 hour  Intake           1427.4 ml  Output             1425 ml  Net              2.4 ml     Physical Exam: General:  Sitting in chair No resp difficulty HEENT: normal Neck: supple.  RIJ cordis. Carotids 2+ bilat; no bruits. No lymphadenopathy or thryomegaly appreciated. Cor: PMI nondisplaced. Sternal dressing intact Iregular rate & rhythm. No rubs, gallops or murmurs. Lungs: clear Abdomen: soft, nontender, nondistended. No hepatosplenomegaly. No bruits or masses. Good bowel sounds. Extremities: no cyanosis, clubbing, rash, trace -1+ edema Neuro: alert & orientedx3, cranial nerves grossly intact. moves all 4 extremities w/o difficulty. Affect pleasant  Telemetry:  AF 65-85 Personally reviewed      Labs: Basic Metabolic Panel:  Recent Labs Lab 05/03/16 1837  05/04/16 0413 05/04/16 1642 05/04/16 1645 05/05/16 0430 05/06/16 0355 05/07/16 0345  NA  --   < > 135 136  --  133* 135 138  K  --   < > 4.5 4.8  --  4.5 3.7 3.8  CL  --   < > 105 101  --  101 101 101  CO2  --   --  21*  --   --  25 28 31   GLUCOSE  --   < >  112* 132*  --  141* 102* 97  BUN  --   < > 9 14  --  9 10 12   CREATININE 0.90  < > 0.84 0.80 0.91 0.75 0.82 0.87  CALCIUM  --   < > 7.4*  --   --  8.4* 8.2* 8.0*  MG 2.9*  --  2.3  --  2.5*  --   --   --   < > = values in this interval not displayed.  Liver Function Tests: No results for input(s): AST, ALT, ALKPHOS, BILITOT, PROT, ALBUMIN in the last 168 hours. No results for input(s): LIPASE, AMYLASE in the last 168 hours. No results for input(s): AMMONIA in the last 168 hours.  CBC:  Recent Labs Lab 05/04/16 0413 05/04/16 1642 05/04/16 1645 05/05/16 0430 05/06/16 0355 05/07/16 0345  WBC 12.7*  --  11.8* 12.7* 7.9 6.9  HGB 10.1* 11.2* 11.0* 11.1* 10.1* 9.9*  HCT 31.0* 33.0* 34.0* 34.8* 31.5* 30.9*  MCV 91.4  --  92.4 92.3 92.1 93.6  PLT 117*  --  113* 119* 108* 140*    Cardiac Enzymes: No results for input(s): CKTOTAL, CKMB, CKMBINDEX, TROPONINI in the last 168 hours.  BNP: BNP (last 3 results) No results for input(s): BNP in the last 8760 hours.  ProBNP (last 3 results) No results for input(s): PROBNP in the last 8760 hours.    Other results:  Imaging: Dg Chest Port 1 View  Result Date: 05/07/2016 CLINICAL DATA:  Aortic valve replacement. EXAM: PORTABLE CHEST 1 VIEW COMPARISON:  05/06/2016 and prior radiographs FINDINGS: Cardiomegaly, aortic valve replacement and right IJ central venous catheter sheath again noted. Improved bibasilar aeration with continued left lower lung atelectasis/ consolidation and small effusion. No definite pneumothorax is noted. No other changes identified. IMPRESSION: Improved bibasilar aeration with continued left lower lung atelectasis/ consolidation and small effusion. No definite pneumothorax. Electronically Signed   By: Harmon Pier M.D.   On: 05/07/2016 07:49   Dg Chest Port 1 View  Result Date: 05/06/2016 CLINICAL DATA:  Chest tube in place EXAM: PORTABLE CHEST 1 VIEW COMPARISON:  Yesterday FINDINGS: Right IJ sheath in stable  position. Status post aortic valve replacement with normal heart size. Layering pleural effusions and atelectasis at the bases, mildly improved. Stable trace right apical pneumothorax. IMPRESSION: 1. Stable trace right apical pneumothorax. 2. Atelectasis and layering pleural effusions. Aeration is improved since yesterday. Electronically Signed   By: Marnee Spring M.D.   On: 05/06/2016 07:22      Medications:     Scheduled Medications: . acetaminophen  1,000 mg Oral Q6H   Or  . acetaminophen (TYLENOL) oral liquid 160 mg/5 mL  1,000 mg Per Tube Q6H  . [START ON 05/14/2016] amiodarone  200 mg Oral BID  . amiodarone  400 mg Oral BID  . amphetamine-dextroamphetamine  20 mg Oral Daily  . aspirin EC  325 mg Oral Daily   Or  . aspirin  324 mg Per Tube Daily  . bisacodyl  10 mg Oral Daily   Or  . bisacodyl  10 mg Rectal Daily  . docusate sodium  200 mg Oral Daily  . enoxaparin (LOVENOX) injection  30 mg Subcutaneous Q24H  . furosemide  20 mg Intravenous BID  . metoprolol tartrate  25 mg Oral Q8H  . pantoprazole  40 mg Oral Daily  . progesterone  100 mg Oral QHS  . sodium chloride flush  3 mL Intravenous Q12H  . warfarin  5 mg Oral q1800  . Warfarin - Physician Dosing Inpatient   Does not apply q1800     Infusions: . sodium chloride 250 mL (05/04/16 0617)  . amiodarone 30 mg/hr (05/07/16 0800)  . lactated ringers 20 mL/hr at 05/07/16 0800  . nitroGLYCERIN    . phenylephrine (NEO-SYNEPHRINE) Adult infusion Stopped (05/04/16 0800)     PRN Medications:  LORazepam, metoprolol, morphine injection, ondansetron (ZOFRAN) IV, promethazine, sodium chloride flush, traMADol   Assessment:   1. 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 PAF     --chadsvasc 2 3. Acute systolic/diastolic HF    --EF 40-98% on pre-op ech 11/17  Plan/Discussion:    Remains in AF but well rate controlled, Will continue IV amio for today. Can switch to po tomorrow. Remains volume overloaded. Weight up 7  pounds from pre-op. Diuresing with IV lasix. Will continue.  On coumadin INR 1.07   Continue to ambulate.   Length of Stay: 4  Arvilla Meres MD 05/07/2016, 9:03 AM  Advanced Heart Failure Team Pager [phone] (M-F; 7a - 4p)  Please  contact [facility] for night-coverage after hours (4p -7a ) and weekends on [email or web address]

## 2016-05-07 NOTE — Plan of Care (Signed)
Problem: Nutritional: Goal: Risk for body nutrition deficit will decrease Outcome: Progressing Pt tolerating diet advance to Heart healthy and able to consume adequate proportions. Bowel sounds present and Pt reports passing flatus.  Problem: Pain Management: Goal: Pain level will decrease Outcome: Progressing Pt paun rate is staying at 2/2 with PRN Ultram and appropriate use of coughing pillow for splinting.

## 2016-05-08 ENCOUNTER — Inpatient Hospital Stay (HOSPITAL_COMMUNITY): Payer: BC Managed Care – PPO

## 2016-05-08 LAB — CBC
HCT: 32.7 % — ABNORMAL LOW (ref 36.0–46.0)
Hemoglobin: 10.3 g/dL — ABNORMAL LOW (ref 12.0–15.0)
MCH: 29.5 pg (ref 26.0–34.0)
MCHC: 31.5 g/dL (ref 30.0–36.0)
MCV: 93.7 fL (ref 78.0–100.0)
Platelets: 200 10*3/uL (ref 150–400)
RBC: 3.49 MIL/uL — ABNORMAL LOW (ref 3.87–5.11)
RDW: 14.9 % (ref 11.5–15.5)
WBC: 6.3 10*3/uL (ref 4.0–10.5)

## 2016-05-08 LAB — BASIC METABOLIC PANEL
ANION GAP: 8 (ref 5–15)
BUN: 11 mg/dL (ref 6–20)
CALCIUM: 8.4 mg/dL — AB (ref 8.9–10.3)
CO2: 33 mmol/L — ABNORMAL HIGH (ref 22–32)
Chloride: 97 mmol/L — ABNORMAL LOW (ref 101–111)
Creatinine, Ser: 0.98 mg/dL (ref 0.44–1.00)
Glucose, Bld: 92 mg/dL (ref 65–99)
Potassium: 4.1 mmol/L (ref 3.5–5.1)
Sodium: 138 mmol/L (ref 135–145)

## 2016-05-08 LAB — PROTIME-INR
INR: 1.02
Prothrombin Time: 13.4 seconds (ref 11.4–15.2)

## 2016-05-08 MED ORDER — WARFARIN SODIUM 5 MG PO TABS
5.0000 mg | ORAL_TABLET | ORAL | Status: AC
  Administered 2016-05-08: 5 mg via ORAL
  Filled 2016-05-08: qty 1

## 2016-05-08 MED ORDER — PATIENT'S GUIDE TO USING COUMADIN BOOK
Freq: Once | Status: DC
  Filled 2016-05-08: qty 1

## 2016-05-08 MED ORDER — WARFARIN SODIUM 5 MG PO TABS: 5.0000 mg | ORAL_TABLET | Freq: Every day | ORAL | Status: DC

## 2016-05-08 NOTE — Progress Notes (Signed)
EPW d/c'd per order and per protcol. Tips intact. VSS. Family and pt educated on need for bedrest x1h and q3m vitals. Call bell and phone within reach. Will continue to monitor

## 2016-05-08 NOTE — Discharge Summary (Signed)
Physician Discharge Summary       [address]       [name] [address]             [phone]    Patient ID: Wanda [name] MRN: [id number] DOB/AGE: 1950-11-13 66 y.o.  Admit date: 05/03/2016 Discharge date: 05/10/2016  Admission Diagnoses: 1. Bicuspid aortic valve 2. Severe aortic insufficiency  Active Diagnoses:  1. Anxiety 2. Arthritis 3. Migraine 4. Post op atrial fibrillation 5. ABL anemia  Consults: cardiology  Procedure (s):  Aortic valve replacement with pericardial tissue valve [employer], 21 mm model 3300TFX, serial [id number] by Dr. Tyrone [name] on 05/03/2016.  History of Presenting Illness: Wanda [name] 66 y.o. female is seen in the office   for Evaluation of aortic insufficiency. The patient was seen 01/18/2016 by cardiology because of  an abnormal EKG and newly diagnosed heart murmur. She was noted to have ventricular bigeminy on EKG. She had a heart murmur suggestive of aortic insufficiency and an echocardiogram was done. The echocardiogram demonstrated severe aortic insufficiency, dilated left ventricle, and mild to moderate LV systolic dysfunction with LVEF 40-45%. Holter monitor demonstrated frequent PVCs and periods of ventricular bigeminy. Heart catheterization has not been done  She has shortness of breath with climbing stairs. She is able to participating low level exercise without any exertional symptoms. She has not had heart palpitations, chest pain, chest pressure, lightheadedness, or syncope. She denies orthopnea, PND, or leg swelling.   Patient's family history is significant for her brother Wanda [name] date of birth 09/[birth year]1940 who had a history of pulmonary embolus and DVT in his late 38s. In May 2004 he underwent aortic valve replacement with a 25 mechanical St. Jude valve and ligation of his left atrial appendage for a critically stenotic bicuspid aortic valve.  In 2080 presented with advanced stage lung cancer which he  ultimately succumbed to. I do not have specific records concerning the brothers hypercoagulable state but he had been started on lifelong Coumadin by his hematologist prior to valve replacement. Dr. Tyrone [name]  had extensive discussion with patient about pros and cons of mechanical vs tissue valves , she prefers tissue valve. Potential risks, benefits, and complications of the surgery were discussed with the patient and she agreed to proceed with surgery. She was admitted on 05/03/2016 in order to undergo an aortic valve replacement.   Brief Hospital Course:  The patient was extubated the evening of surgery without difficulty. He/she remained afebrile and hemodynamically stable. Theone Murdoch, a line, chest tubes, and foley were removed early in the post operative course. Lopressor was started and titrated accordingly. She went into a fib with RVR. She was put on an Amiodarone drip. She was later put on oral Amiodarone. She was started on Coumadin. Her PT and INR were monitored daily. She is on Couamdin 5 mg daily. Her last INR was 1.45. She will need to have a PT and INR drawn 48 hours after discharge from the hospital. She was volume over loaded and diuresed. She had ABL anemia. She did not require a post op transfusion. Last H and H was 10.3 and 32.7 She had mild thrombocytopenia but this resolved as last platelet count was up to 200,000.Marland Kitchen She was weaned off the insulin drip.  The patient's HGA1C pre op was  5.4. The patient was felt surgically stable for transfer from the ICU to [facility]U for further convalescence on 05/07/2016. She continues to progress with cardiac rehab. She was ambulating  on room air. She has been tolerating a diet and has had a bowel movement. Epicardial pacing wires were removed on 05/08/2016.  She has converted to NSR and has maintained this rhythm greater than 24 hours. Chest tube sutures will be removed the day of discharge. The patient is felt surgically stable for discharge today.  Latest  Vital Signs: Blood pressure 117/79, pulse (!) 54, temperature 98.5 F (36.9 C), temperature source Oral, resp. rate 18, height 5\' 4"  (1.626 m), weight 147 lb 11.2 oz (67 kg), last menstrual period 08/12/2004, SpO2 92 %.  Physical Exam: Cardiovascular: RRR Pulmonary: Clear to auscultation bilaterally Abdomen: Soft, non tender, bowel sounds present. Extremities: Mild bilateral lower extremity edema. Wounds: Clean and dry.  No erythema or signs of infection.  Discharge Condition:Stable and discharged to home.   Recent laboratory studies:  Lab Results  Component Value Date   WBC 6.3 05/08/2016   HGB 10.3 (L) 05/08/2016   HCT 32.7 (L) 05/08/2016   MCV 93.7 05/08/2016   PLT 200 05/08/2016   Lab Results  Component Value Date   NA 137 05/10/2016   K 4.0 05/10/2016   CL 96 (L) 05/10/2016   CO2 34 (H) 05/10/2016   CREATININE 1.03 (H) 05/10/2016   GLUCOSE 97 05/10/2016    Diagnostic Studies: Dg Chest 2 View  Result Date: 05/08/2016 CLINICAL DATA:  Pleural effusion; s/p aortic valve replacement 05/03/16; former smoker EXAM: CHEST  2 VIEW COMPARISON:  05/07/2016 FINDINGS: Changes from cardiac surgery stable. There is no mediastinal widening. There persistent lung base opacities, left greater than right, most likely a combination of atelectasis and small pleural effusions. There is no pulmonary edema. No new lung abnormalities. No pneumothorax. Since the prior study, the right internal jugular Cordis has been removed. IMPRESSION: 1. No acute finding or evidence of an operative complication. 2. Persistent left greater than right lung base opacity consistent with combination of atelectasis and small effusions. Electronically Signed   By: Amie Portland M.D.   On: 05/08/2016 10:16   Dg Chest 2 View  Result Date: 04/29/2016 CLINICAL DATA:  Preop evaluation for upcoming cardiac procedure EXAM: CHEST  2 VIEW COMPARISON:  02/01/2008, 03/24/2016 FINDINGS: Cardiac shadow is within normal limits. The  lungs are well aerated bilaterally. Previously seen nodular densities bilaterally are not well appreciated on this exam. No focal infiltrate or sizable effusion is seen. No bony abnormality is noted. IMPRESSION: No active cardiopulmonary disease. Electronically Signed   By: Alcide Clever M.D.   On: 04/29/2016 15:34   Dg Chest Port 1 View  Result Date: 05/07/2016 CLINICAL DATA:  Aortic valve replacement. EXAM: PORTABLE CHEST 1 VIEW COMPARISON:  05/06/2016 and prior radiographs FINDINGS: Cardiomegaly, aortic valve replacement and right IJ central venous catheter sheath again noted. Improved bibasilar aeration with continued left lower lung atelectasis/ consolidation and small effusion. No definite pneumothorax is noted. No other changes identified. IMPRESSION: Improved bibasilar aeration with continued left lower lung atelectasis/ consolidation and small effusion. No definite pneumothorax. Electronically Signed   By: Harmon Pier M.D.   On: 05/07/2016 07:49   Dg Chest Port 1 View  Result Date: 05/06/2016 CLINICAL DATA:  Chest tube in place EXAM: PORTABLE CHEST 1 VIEW COMPARISON:  Yesterday FINDINGS: Right IJ sheath in stable position. Status post aortic valve replacement with normal heart size. Layering pleural effusions and atelectasis at the bases, mildly improved. Stable trace right apical pneumothorax. IMPRESSION: 1. Stable trace right apical pneumothorax. 2. Atelectasis and layering pleural effusions. Aeration is improved  since yesterday. Electronically Signed   By: Marnee Spring M.D.   On: 05/06/2016 07:22   Dg Chest Port 1 View  Result Date: 05/05/2016 CLINICAL DATA:  Chest pain EXAM: PORTABLE CHEST 1 VIEW COMPARISON:  May 04, 2016 FINDINGS: Swan-Ganz catheter has been removed. Mediastinal drain has been removed. Cordis tip is in the superior cava. There is a small right apical pneumothorax, marginally smaller I day prior. No tension component. There is cardiomegaly with bilateral pleural  effusions. There is atelectatic change in both lung bases. There is slight interstitial edema. There is mild pulmonary venous hypertension. Patient is status post aortic valve replacement. IMPRESSION: Persistent changes indicative of a degree of congestive heart failure. Small right apical pneumothorax, marginally smaller than on previous study. Stable cardiac enlargement. Note that a right pleural effusion may be marginally larger than on 1 day prior, although differences may be due to slight differences in patient positioning. Electronically Signed   By: Bretta Bang III M.D.   On: 05/05/2016 07:34   Dg Chest Port 1 View  Result Date: 05/04/2016 CLINICAL DATA:  Status post aortic valve replacement.  Extubation. EXAM: PORTABLE CHEST 1 VIEW COMPARISON:  May 03, 2016 FINDINGS: Endotracheal tube and nasogastric tube have been removed. Swan-Ganz catheter tip is in the right main pulmonary artery. Mediastinal drain is present. Temporary pacemaker wires are attached to the right heart. There is a small right apical pneumothorax. There is interstitial edema with small pleural effusions bilaterally. There is airspace opacity with volume loss in the left lower lobe. There is mild cardiomegaly. The pulmonary vascularity shows evidence of a mild degree of pulmonary venous hypertension. No adenopathy evident. Patient is status post aortic valve replacement. IMPRESSION: Tube and catheter positions as described. There is currently a small right apical pneumothorax without tension component. This pneumothorax is felt to be approximately 5% with respect to size. There is a degree of underlying congestive heart failure. Opacity in the left lower lobe is enlarged part due to atelectasis. There may be either alveolar edema or pneumonia associated in the left lower lobe. The appearance is stable. The cardiac silhouette is stable. These results will be called to the ordering clinician or representative by the Radiologist  Assistant, and communication documented in the PACS or zVision Dashboard. Electronically Signed   By: Bretta Bang III M.D.   On: 05/04/2016 07:45   Dg Chest Port 1 View  Result Date: 05/03/2016 CLINICAL DATA:  Postop AVR, ETT, swan, chest tube EXAM: PORTABLE CHEST 1 VIEW COMPARISON:  04/29/2016 FINDINGS: Endotracheal tube terminates 6 cm above the carina. Suspected small left pleural effusion. Right lung is clear. No pneumothorax. Right IJ Swan-Ganz catheter terminates the main pulmonary artery. Bilateral mediastinal drains. Enteric tube terminates in the proximal stomach. IMPRESSION: Endotracheal tube terminates 6 cm above the carina. Additional support apparatus as above. Small left pleural effusion.  No pneumothorax. Electronically Signed   By: Charline Bills M.D.   On: 05/03/2016 13:31   Discharge Instructions    Amb Referral to Cardiac Rehabilitation    Complete by:  As directed    Diagnosis:  Valve Replacement   Valve:  Aortic     Discharge Medications: Allergies as of 05/10/2016      Reactions   Codeine Nausea And Vomiting   Vicodin [hydrocodone-acetaminophen] Nausea And Vomiting      Medication List    STOP taking these medications   ibuprofen 200 MG tablet Commonly known as:  ADVIL,MOTRIN   pseudoephedrine 30 MG tablet  Commonly known as:  SUDAFED     TAKE these medications   amiodarone 200 MG tablet Commonly known as:  PACERONE Take 1 tablet (200 mg total) by mouth 2 (two) times daily. For 7 Days, then decrease to 200 mg daily Start taking on:  05/14/2016   amphetamine-dextroamphetamine 20 MG tablet Commonly known as:  ADDERALL Take 20 mg by mouth daily. Reported on 08/18/2015   aspirin 81 MG EC tablet Take 1 tablet (81 mg total) by mouth daily.   estradiol 0.05 MG/24HR patch Commonly known as:  MINIVELLE Place 1 patch (0.05 mg total) onto the skin 2 (two) times a week.   furosemide 40 MG tablet Commonly known as:  LASIX Take 1 tablet (40 mg total) by  mouth daily. For 7 Days   LORazepam 0.5 MG tablet Commonly known as:  ATIVAN Take 0.5 mg by mouth daily as needed for anxiety.   metoprolol tartrate 25 MG tablet Commonly known as:  LOPRESSOR Take 1 tablet (25 mg total) by mouth every 8 (eight) hours.   potassium chloride SA 20 MEQ tablet Commonly known as:  K-DUR,KLOR-CON Take 1 tablet (20 mEq total) by mouth daily.   progesterone 100 MG capsule Commonly known as:  PROMETRIUM Take 100 mg by mouth at bedtime.   rizatriptan 10 MG disintegrating tablet Commonly known as:  MAXALT-MLT Take 10 mg by mouth once as needed for migraine. May repeat in 2 hours if needed   traMADol 50 MG tablet Commonly known as:  ULTRAM Take 1-2 tablets (50-100 mg total) by mouth every 4 (four) hours as needed for moderate pain.   warfarin 5 MG tablet Commonly known as:  COUMADIN Take 1 tablet (5 mg total) by mouth daily at 6 PM.      The patient has been discharged on:   1.Beta Blocker:  Yes [ x  ]                              No   [   ]                              If No, reason:  2.Ace Inhibitor/ARB: Yes [   ]                                     No  [x    ]                                     If No, reason:Labile BP  3.Statin:   Yes [   ]                  No  [  x ]                  If No, reason:No CAD  4.Marlowe KaysCarlyle Basques   ]                  No   [   ]                  If No, reason:  Follow Up Appointments: Follow-up Information    [name], MD Follow up  on 06/16/2016.   Specialty:  Cardiothoracic Surgery Why:  PA/LAT CXR to be taken (at [facility] which is in the same building as Dr. Dennie Maizes office) one hour prior to office appointment; Appointment is at 2:30 Contact information: [address] [place] [address] [phone]        Tonny Bollman, MD Follow up.   Specialty:  Cardiology Why:  Call for a follow up appointment for 2 weeks Contact information: [address] [place] [address] [phone]        [facility] [employer] [facility] Follow up on 05/12/2016.   Specialty:  Cardiology Why:  Call to have a PT and INR (as is on Coumadin for a fib) for Thursday Contact information: [address], [address] [place] [address] [phone]          Signed: Marrion Coy 05/10/2016, 7:53 AM

## 2016-05-08 NOTE — Progress Notes (Signed)
Advanced Heart Failure Rounding Note   Subjective:    Sitting up in chair. Feels better. Remains in AF on amio. Rates 70-80s. Walking halls.    Remains on IV lasix. Weight down 3 pounds but still up 8 pounds from pre-op . Denies dyspnea    Objective:   Weight Range:  Vital Signs:   Temp:  [97.9 F (36.6 C)-98.5 F (36.9 C)] 98.5 F (36.9 C) (02/25 0300) Pulse Rate:  [55-91] 91 (02/25 0604) Resp:  [11-19] 14 (02/25 0300) BP: (99-118)/(67-91) 118/74 (02/25 0604) SpO2:  [92 %-98 %] 97 % (02/25 0300) Weight:  [70.2 kg (154 lb 12.2 oz)] 70.2 kg (154 lb 12.2 oz) (02/25 0500) Last BM Date: 05/06/16  Weight change: Filed Weights   05/06/16 0500 05/07/16 0800 05/08/16 0500  Weight: 69 kg (152 lb 1.9 oz) 68.4 kg (150 lb 11.2 oz) 70.2 kg (154 lb 12.2 oz)    Intake/Output:   Intake/Output Summary (Last 24 hours) at 05/08/16 0949 Last data filed at 05/07/16 2045  Gross per 24 hour  Intake           473.64 ml  Output              900 ml  Net          -426.36 ml     Physical Exam: General:  Sitting in bed No resp difficulty HEENT: normal Neck: supple.  JVP 8-9 Carotids 2+ bilat; no bruits. No lymphadenopathy or thryomegaly appreciated. Cor: PMI nondisplaced. Sternal wound ok Iregular rate & rhythm. No rubs, gallops or murmurs. Lungs: clear Abdomen: soft, nontender, nondistended. No hepatosplenomegaly. No bruits or masses. Good bowel sounds. Extremities: no cyanosis, clubbing, rash, no edema Neuro: alert & orientedx3, cranial nerves grossly intact. moves all 4 extremities w/o difficulty. Affect pleasant  Telemetry:  AF 70-85 Personally reviewed      Labs: Basic Metabolic Panel:  Recent Labs Lab 05/03/16 1837  05/04/16 0413 05/04/16 1642 05/04/16 1645 05/05/16 0430 05/06/16 0355 05/07/16 0345 05/08/16 0304  NA  --   < > 135 136  --  133* 135 138 138  K  --   < > 4.5 4.8  --  4.5 3.7 3.8 4.1  CL  --   < > 105 101  --  101 101 101 97*  CO2  --   --  21*  --    --  25 28 31  33*  GLUCOSE  --   < > 112* 132*  --  141* 102* 97 92  BUN  --   < > 9 14  --  9 10 12 11   CREATININE 0.90  < > 0.84 0.80 0.91 0.75 0.82 0.87 0.98  CALCIUM  --   < > 7.4*  --   --  8.4* 8.2* 8.0* 8.4*  MG 2.9*  --  2.3  --  2.5*  --   --   --   --   < > = values in this interval not displayed.  Liver Function Tests: No results for input(s): AST, ALT, ALKPHOS, BILITOT, PROT, ALBUMIN in the last 168 hours. No results for input(s): LIPASE, AMYLASE in the last 168 hours. No results for input(s): AMMONIA in the last 168 hours.  CBC:  Recent Labs Lab 05/04/16 1645 05/05/16 0430 05/06/16 0355 05/07/16 0345 05/08/16 0304  WBC 11.8* 12.7* 7.9 6.9 6.3  HGB 11.0* 11.1* 10.1* 9.9* 10.3*  HCT 34.0* 34.8* 31.5* 30.9* 32.7*  MCV 92.4 92.3 92.1 93.6  93.7  PLT 113* 119* 108* 140* 200    Cardiac Enzymes: No results for input(s): CKTOTAL, CKMB, CKMBINDEX, TROPONINI in the last 168 hours.  BNP: BNP (last 3 results) No results for input(s): BNP in the last 8760 hours.  ProBNP (last 3 results) No results for input(s): PROBNP in the last 8760 hours.    Other results:  Imaging: Dg Chest Port 1 View  Result Date: 05/07/2016 CLINICAL DATA:  Aortic valve replacement. EXAM: PORTABLE CHEST 1 VIEW COMPARISON:  05/06/2016 and prior radiographs FINDINGS: Cardiomegaly, aortic valve replacement and right IJ central venous catheter sheath again noted. Improved bibasilar aeration with continued left lower lung atelectasis/ consolidation and small effusion. No definite pneumothorax is noted. No other changes identified. IMPRESSION: Improved bibasilar aeration with continued left lower lung atelectasis/ consolidation and small effusion. No definite pneumothorax. Electronically Signed   By: Harmon Pier M.D.   On: 05/07/2016 07:49     Medications:     Scheduled Medications: . acetaminophen  1,000 mg Oral Q6H   Or  . acetaminophen (TYLENOL) oral liquid 160 mg/5 mL  1,000 mg Per Tube Q6H    . [START ON 05/14/2016] amiodarone  200 mg Oral BID  . amiodarone  400 mg Oral BID  . amphetamine-dextroamphetamine  20 mg Oral Daily  . aspirin EC  81 mg Oral Daily  . bisacodyl  10 mg Oral Daily   Or  . bisacodyl  10 mg Rectal Daily  . docusate sodium  200 mg Oral Daily  . enoxaparin (LOVENOX) injection  30 mg Subcutaneous Q24H  . furosemide  40 mg Oral Daily  . metoprolol tartrate  25 mg Oral Q8H  . moving right along book   Does not apply Once  . pantoprazole  40 mg Oral Daily  . potassium chloride  20 mEq Oral Daily  . progesterone  100 mg Oral QHS  . sodium chloride flush  3 mL Intravenous Q12H  . sodium chloride flush  3 mL Intravenous Q12H  . warfarin  5 mg Oral NOW  . Warfarin - Physician Dosing Inpatient   Does not apply q1800    Infusions: . sodium chloride 250 mL (05/04/16 0617)  . lactated ringers Stopped (05/07/16 0955)    PRN Medications: sodium chloride, LORazepam, metoprolol, morphine injection, ondansetron (ZOFRAN) IV, promethazine, sodium chloride flush, sodium chloride flush, traMADol   Assessment:   1. 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 PAF     --chadsvasc 2 3. Acute systolic/diastolic HF    --EF 02-72% on pre-op echo 11/17  Plan/Discussion:    Remains in AF but well rate controlled. Now on po amio. TCTS adjusting coumadin.   Volume status improving. Will switch to po lasix.   Continue to ambulate.   Length of Stay: 5  Bensimhon, Daniel MD 05/08/2016, 9:49 AM  Advanced Heart Failure Team Pager [phone] (M-F; 7a - 4p)  Please contact [facility] for night-coverage after hours (4p -7a ) and weekends on [email or web address]

## 2016-05-08 NOTE — Progress Notes (Addendum)
      [address]       [place] [address]             [phone]        5 Days Post-Op Procedure(s) (LRB): AORTIC VALVE REPLACEMENT (AVR) WITH MAGNA EASE PERICARDIAL BIOPROSTHESIS - AORTIC 21 MM MODEL 3300TFX (N/A) TRANSESOPHAGEAL ECHOCARDIOGRAM (TEE) (N/A)  Subjective: Patient states has less nausea this am.  Objective: Vital signs in last 24 hours: Temp:  [97.9 F (36.6 C)-98.5 F (36.9 C)] 98.5 F (36.9 C) (02/25 0300) Pulse Rate:  [55-91] 91 (02/25 0604) Cardiac Rhythm: Atrial fibrillation (02/25 0722) Resp:  [11-19] 14 (02/25 0300) BP: (99-118)/(61-91) 118/74 (02/25 0604) SpO2:  [92 %-98 %] 97 % (02/25 0300) Weight:  [150 lb 11.2 oz (68.4 kg)-154 lb 12.2 oz (70.2 kg)] 154 lb 12.2 oz (70.2 kg) (02/25 0500)  Pre op weight 65 kg Current Weight  05/08/16 154 lb 12.2 oz (70.2 kg)       Intake/Output from previous day: 02/24 0701 - 02/25 0700 In: 667 [P.O.:560; I.V.:107] Out: 900 [Urine:900]   Physical Exam:  Cardiovascular: IRRR IRRR Pulmonary: Clear to auscultation bilaterally Abdomen: Soft, non tender, bowel sounds present. Extremities: Mild bilateral lower extremity edema. Wounds: Clean and dry.  No erythema or signs of infection.  Lab Results: CBC: Recent Labs  05/07/16 0345 05/08/16 0304  WBC 6.9 6.3  HGB 9.9* 10.3*  HCT 30.9* 32.7*  PLT 140* 200   BMET:  Recent Labs  05/07/16 0345 05/08/16 0304  NA 138 138  K 3.8 4.1  CL 101 97*  CO2 31 33*  GLUCOSE 97 92  BUN 12 11  CREATININE 0.87 0.98  CALCIUM 8.0* 8.4*    PT/INR:  Lab Results  Component Value Date   INR 1.02 05/08/2016   INR 1.03 05/07/2016   INR 1.40 05/03/2016   ABG:  INR: Will add last result for INR, ABG once components are confirmed Will add last 4 CBG results once components are confirmed  Assessment/Plan:  1.CV-PAF. In a fib with a CVR. On Amiodarone 400 mg bid, Lopressor 12.5 mg bid, and Coumadin. INR 1.02. Will increase Coumadin to 5  mg. 2. Pulmonary- On 1 liter of oxygen via [place]. CXR this am appears stable  (cardiomegaly, bibasilar atelectasis/ small effusions.) Encourage incentive spirometer. 3. Volume Overload-On Lasix 40 mg daily 4. ABL anemia-H and H 10.3 and 32.7 5. Mild thrombocytopenia resolved-platelets up to 200,000 6. Remove EPW 7. Home in next 1-2 days once able to determine Coumadin dose  ZIMMERMAN,DONIELLE MPA-C 05/08/2016,7:42 AM  Dg Chest 2 View  Result Date: 05/08/2016 CLINICAL DATA:  Pleural effusion; s/p aortic valve replacement 05/03/16; former smoker EXAM: CHEST  2 VIEW COMPARISON:  05/07/2016 FINDINGS: Changes from cardiac surgery stable. There is no mediastinal widening. There persistent lung base opacities, left greater than right, most likely a combination of atelectasis and small pleural effusions. There is no pulmonary edema. No new lung abnormalities. No pneumothorax. Since the prior study, the right internal jugular Cordis has been removed. IMPRESSION: 1. No acute finding or evidence of an operative complication. 2. Persistent left greater than right lung base opacity consistent with combination of atelectasis and small effusions. Electronically Signed   By: Amie Portland M.D.   On: 05/08/2016 10:16   I have seen and examined Wanda [name] and agree with the above assessment  and plan.  [name] MD Beeper [phone] Office [phone] 05/08/2016 10:55 AM

## 2016-05-08 NOTE — Discharge Instructions (Addendum)
Information on my medicine - Coumadin   (Warfarin)  This medication education was reviewed with me or my healthcare representative as part of my discharge preparation.  The pharmacist that spoke with me during my hospital stay was:  Jodean Lima Rudisill, RPH  Why was Coumadin prescribed for you? Coumadin was prescribed for you because you have a blood clot or a medical condition that can cause an increased risk of forming blood clots. Blood clots can cause serious health problems by blocking the flow of blood to the heart, lung, or brain. Coumadin can prevent harmful blood clots from forming. As a reminder your indication for Coumadin is:   Blood Clot Prevention After Heart Valve Surgery  What test will check on my response to Coumadin? While on Coumadin (warfarin) you will need to have an INR test regularly to ensure that your dose is keeping you in the desired range. The INR (international normalized ratio) number is calculated from the result of the laboratory test called prothrombin time (PT).  If an INR APPOINTMENT HAS NOT ALREADY BEEN MADE FOR YOU please schedule an appointment to have this lab work done by your health care provider within 7 days. Your INR goal is usually a number between:  2 to 3 or your provider may give you a more narrow range like 2-2.5.  Ask your health care provider during an office visit what your goal INR is.  What  do you need to  know  About  COUMADIN? Take Coumadin (warfarin) exactly as prescribed by your healthcare provider about the same time each day.  DO NOT stop taking without talking to the doctor who prescribed the medication.  Stopping without other blood clot prevention medication to take the place of Coumadin may increase your risk of developing a new clot or stroke.  Get refills before you run out.  What do you do if you miss a dose? If you miss a dose, take it as soon as you remember on the same day then continue your regularly scheduled regimen the next  day.  Do not take two doses of Coumadin at the same time.  Important Safety Information A possible side effect of Coumadin (Warfarin) is an increased risk of bleeding. You should call your healthcare provider right away if you experience any of the following: ? Bleeding from an injury or your nose that does not stop. ? Unusual colored urine (red or dark brown) or unusual colored stools (red or black). ? Unusual bruising for unknown reasons. ? A serious fall or if you hit your head (even if there is no bleeding).  Some foods or medicines interact with Coumadin (warfarin) and might alter your response to warfarin. To help avoid this: ? Eat a balanced diet, maintaining a consistent amount of Vitamin K. ? Notify your provider about major diet changes you plan to make. ? Avoid alcohol or limit your intake to 1 drink for women and 2 drinks for men per day. (1 drink is 5 oz. wine, 12 oz. beer, or 1.5 oz. liquor.)  Make sure that ANY health care provider who prescribes medication for you knows that you are taking Coumadin (warfarin).  Also make sure the healthcare provider who is monitoring your Coumadin knows when you have started a new medication including herbals and non-prescription products.  Coumadin (Warfarin)  Major Drug Interactions  Increased Warfarin Effect Decreased Warfarin Effect  Alcohol (large quantities) Antibiotics (esp. Septra/Bactrim, Flagyl, Cipro) Amiodarone (Cordarone) Aspirin (ASA) Cimetidine (Tagamet) Megestrol (Megace) NSAIDs (  ibuprofen, naproxen, etc.) Piroxicam (Feldene) Propafenone (Rythmol SR) Propranolol (Inderal) Isoniazid (INH) Posaconazole (Noxafil) Barbiturates (Phenobarbital) Carbamazepine (Tegretol) Chlordiazepoxide (Librium) Cholestyramine (Questran) Griseofulvin Oral Contraceptives Rifampin Sucralfate (Carafate) Vitamin K   Coumadin (Warfarin) Major Herbal Interactions  Increased Warfarin Effect Decreased Warfarin Effect   Garlic Ginseng Ginkgo biloba Coenzyme Q10 Green tea St. Johns wort    Coumadin (Warfarin) FOOD Interactions  Eat a consistent number of servings per week of foods HIGH in Vitamin K (1 serving =  cup)  Collards (cooked, or boiled & drained) Kale (cooked, or boiled & drained) Mustard greens (cooked, or boiled & drained) Parsley *serving size only =  cup Spinach (cooked, or boiled & drained) Swiss chard (cooked, or boiled & drained) Turnip greens (cooked, or boiled & drained)  Eat a consistent number of servings per week of foods MEDIUM-HIGH in Vitamin K (1 serving = 1 cup)  Asparagus (cooked, or boiled & drained) Broccoli (cooked, boiled & drained, or raw & chopped) Brussel sprouts (cooked, or boiled & drained) *serving size only =  cup Lettuce, raw (green leaf, endive, romaine) Spinach, raw Turnip greens, raw & chopped   These websites have more information on Coumadin (warfarin):  http://www.king-russell.com/; https://www.hines.net/;    Aortic Valve Replacement, Care After Refer to this sheet in the next few weeks. These instructions provide you with information about caring for yourself after your procedure. Your health care provider may also give you more specific instructions. Your treatment has been planned according to current medical practices, but problems sometimes occur. Call your health care provider if you have any problems or questions after your procedure. What can I expect after the procedure? After the procedure, it is common to have:  Pain around your incision area.  A small amount of blood or clear fluid coming from your incision. Follow these instructions at home: Eating and drinking  Follow instructions from your health care provider about eating or drinking restrictions.  Limit alcohol intake to no more than 1 drink per day for nonpregnant women and 2 drinks per day for men. One drink equals 12 oz of beer, 5 oz of wine, or 1 oz of hard  liquor.  Limit how much caffeine you drink. Caffeine can affect your heart's rate and rhythm.  Drink enough fluid to keep your urine clear or pale yellow.  Eat a heart-healthy diet. This should include plenty of fresh fruits and vegetables. If you eat meat, it should be lean cuts. Avoid foods that are:  High in salt, saturated fat, or sugar.  Canned or highly processed.  [place]. Activity  Return to your normal activities as told by your health care provider. Ask your health care provider what activities are safe for you.  Exercise regularly once you have recovered, as told by your health care provider.  Avoid sitting for more than 2 hours at a time without moving. Get up and move around at least once every 1-2 hours. This helps to prevent blood clots in the legs.  Do not lift anything that is heavier than 10 lb (4.5 kg) until your health care provider approves.  Avoid pushing or pulling things with your arms until your health care provider approves. This includes pulling on handrails to help you climb stairs. Incision care  Follow instructions from your health care provider about how to take care of your incision. Make sure you:  Wash your hands with soap and water before you change your bandage (dressing). If soap and water are not available, use hand  sanitizer.  Change your dressing as told by your health care provider.  Leave stitches (sutures), skin glue, or adhesive strips in place. These skin closures may need to stay in place for 2 weeks or longer. If adhesive strip edges start to loosen and curl up, you may trim the loose edges. Do not remove adhesive strips completely unless your health care provider tells you to do that.  Check your incision area every day for signs of infection. Check for:  More redness, swelling, or pain.  More fluid or blood.  Warmth.  Pus or a bad smell. Medicines  Take over-the-counter and prescription medicines only as told by your health  care provider.  If you were prescribed an antibiotic medicine, take it as told by your health care provider. Do not stop taking the antibiotic even if you start to feel better. Travel  Avoid airplane travel for as long as told by your health care provider.  When you travel, bring a list of your medicines and a record of your medical history with you. Carry your medicines with you. Driving  Ask your health care provider when it is safe for you to drive. Do not drive until your health care provider approves.  Do not drive or operate heavy machinery while taking prescription pain medicine. Lifestyle  Do not use any tobacco products, such as cigarettes, chewing tobacco, or e-cigarettes. If you need help quitting, ask your health care provider.  Resume sexual activity as told by your health care provider. Do not use medicines for erectile dysfunction unless your health care provider approves, if this applies.  Work with your health care provider to keep your blood pressure and cholesterol under control, and to manage any other heart conditions that you have.  Maintain a healthy weight. General instructions  Do not take baths, swim, or use a hot tub until your health care provider approves.  Do not strain to have a bowel movement.  Avoid crossing your legs while sitting down.  Check your temperature every day for a fever. A fever may be a sign of infection.  If you are a woman and you plan to become pregnant, talk with your health care provider before you become pregnant.  Wear compression stockings if your health care provider instructs you to do this. These stockings help to prevent blood clots and reduce swelling in your legs.  Tell all health care providers who care for you that you have an artificial (prosthetic) aortic valve. If you have or have had heart disease or endocarditis, tell all health care providers about these conditions as well.  Keep all follow-up visits as told by  your health care provider. This is important. Contact a health care provider if:  You develop a skin rash.  You experience sudden, unexplained changes in your weight.  You have more redness, swelling, or pain around your incision.  You have more fluid or blood coming from your incision.  Your incision feels warm to the touch.  You have pus or a bad smell coming from your incision.  You have a fever. Get help right away if:  You develop chest pain that is different from the pain coming from your incision.  You develop shortness of breath or difficulty breathing.  You start to feel light-headed. These symptoms may represent a serious problem that is an emergency. Do not wait to see if the symptoms will go away. Get medical help right away. Call your local emergency services (911 in the  [place] Do not drive yourself to the hospital.  This information is not intended to replace advice given to you by your health care provider. Make sure you discuss any questions you have with your health care provider. Document Released: 09/16/2004 Document Revised: 08/06/2015 Document Reviewed: 02/01/2015 Elsevier Interactive Patient Education  2017 [employer]  What You Need to Know About Warfarin Warfarin is a blood thinner (anticoagulant). Anticoagulants help to prevent the formation of blood clots. They also help to stop the growth of blood clots. Who should use warfarin? Warfarin is prescribed for people who are at risk for developing harmful blood clots, such as people who have:  Surgically implanted mechanical heart valves.  Irregular heart rhythms (atrial fibrillation).  Certain clotting disorders.  A history of harmful blood clotting in the past. This includes people who have had:  A stroke.  Blood clot in the lungs (pulmonary embolism, or PE).  Blood clot in the legs (deep vein thrombosis, or DVT).  An existing blood clot. How is warfarin taken?   Warfarin is a medicine that  you take by mouth (orally). Warfarin tablets come in different strengths. Each tablet strength is a different color, with the amount of warfarin printed on the tablet. If you get a new prescription filled and the color of your tablet is different than usual, tell your pharmacist or health care provider immediately. What blood tests do I need while taking warfarin? The goal of warfarin therapy is to lessen the clotting tendency of blood, but not to prevent clotting completely. Your health care provider will monitor the anticoagulation effect of warfarin closely and will adjust your dose as needed. Warfarin is a medicine that needs to be closely monitored, so it is very important to keep all lab visits and follow-up visits with your health care provider. While taking warfarin, you will need to have blood tests (prothrombin tests, or PT tests) regularly to measure your blood clotting time. This type of test can be done with a finger stick or a blood draw. What does the INR test result mean? The PT test results will be reported as the International Normalized Ratio (INR). The INR tells your health care provider whether your dosage of warfarin needs to be changed. The longer it takes your blood to clot, the higher the INR. Your health care provider will tell you your target INR range. If your INR is not in your target range, your health care provider may adjust your dosage.  If your INR is above your target range, there is a risk of bleeding. Your dosage of warfarin may need to be decreased.  If your INR is below your target range, there is a risk of clotting. Your dosage of warfarin may need to be increased. How often is the INR test needed?  When you first start warfarin, you will usually have your INR checked every few days.  You may need to have INR tests done more than once a week until you are taking the correct dosage of warfarin.  After you have reached your target INR, your INR will be tested  less often. However, you will need to have your INR checked at least once every 4-6 weeks for the entire time you are taking warfarin. What are the side effects of warfarin? Too much warfarin can cause bleeding (hemorrhage) in any part of the body, such as:  Bleeding from the gums.  Unexplained bruises.  Bruises that get larger.  Blood in the urine.  Bloody or  dark stools.  Bleeding in the brain (hemorrhagic stroke).  A nosebleed that is not easily stopped.  Coughing up blood.  Vomiting blood. Warfarin use may also cause:  Skin rash or irritations  Nausea that does not go away.  Severe pain in the back or joints.  Painful toes that turn blue or purple (purple toe syndrome).  Painful ulcers that do not go away (skin necrosis). What are the signs and symptoms of a blood clot? Too little warfarin can increase the risk of blood clots in your legs, lungs, or arms. Signs and symptoms of a DVT in your leg or arm may include:  Pain or swelling in your leg or arm.  Skin that is red or warm to the touch on your arm or leg. Signs and symptoms of a pulmonary embolism may include:  Shortness of breath or difficulty breathing.  Chest pain.  Unexplained fever. What are the signs and symptoms of a stroke? If you are taking too much or too little warfarin, you can have a stroke. Signs and symptoms of a stroke may include:  Weakness or numbness of your face, arm, or leg, especially on one side of your body.  Confusion or trouble thinking clearly.  Difficulty seeing with one or both eyes.  Difficulty walking or moving your arms or legs.  Dizziness.  Loss of balance or coordination.  Trouble speaking, trouble understanding speech, or both (aphasia).  Sudden, severe headache with no known cause.  Partial or total loss of consciousness. What precautions do I need to take while using warfarin?   Take warfarin exactly as told by your health care provider. Doing this helps  you avoid bleeding or blood clots that could result in serious injury, pain, or disability.  Take your medicine at the same time every day. If you forget to take your dose of warfarin, take it as soon as you remember that day. If you do not remember on that day, do not take an extra dose the next day.  Contact your health care provider if you miss or take an extra dose. Do not change your dosage on your own to make up for missed or extra doses.  Wear or carry identification that says that you are taking warfarin.  Make sure that all health care providers, including your dentist, know you are taking warfarin.  If you need surgery, talk with your health care provider about whether you should stop taking warfarin before your surgery.  Avoid situations that cause bleeding. You may bleed more easily while taking warfarin. To limit bleeding, take the following actions:  Use a softer toothbrush.  Floss with waxed floss, not unwaxed floss.  Shave with an electric razor, not with a blade.  Limit your use of sharp objects.  Avoid potentially harmful activities, such as contact sports. What do I need to know about warfarin and pregnancy or breastfeeding?  Warfarin is not recommended during the first trimester of pregnancy due to an increased risk of birth defects. In certain situations, a woman may take warfarin after her first trimester of pregnancy.  If you are taking warfarin and you become pregnant or plan to become pregnant, contact your health care provider right away.  If you plan to breastfeed while taking warfarin, talk with your health care provider first. What do I need to know about warfarin and alcohol or drug use?  Avoid drinking alcohol, or limit alcohol intake to no more than 1 drink a day for nonpregnant women and  2 drinks a day for men. One drink equals 12 oz of beer, 5 oz of wine, or 1 oz of hard liquor.  If you change the amount of alcohol that you drink, tell your health  care provider. Your warfarin dosage may need to be changed.  Avoid tobacco products, such as cigarettes, chewing tobacco, and e-cigarettes. If you need help quitting, ask your health care provider.  If you change the amount of nicotine or tobacco that you use, tell your health care provider. Your warfarin dosage may need to be changed.  Avoid street drugs while taking warfarin. The effects of street drugs on warfarin are not known. What do I need to know about warfarin and other medicines or supplements?  Many prescription and over-the-counter medicines can interfere with warfarin. Talk with your health care provider or your pharmacist before starting or stopping any new medicines. This includes over-the-counter vitamins, dietary supplements, herbal medicines, and pain medicines. Your warfarin dosage may need to be adjusted.  Some common over-the-counter medicines that may increase the risk of bleeding while taking warfarin include:  Acetaminophen.  Aspirin.  NSAIDs, such as ibuprofen or naproxen.  Vitamin E. What do I need to know about warfarin and my diet?  It is important to maintain a normal, balanced diet while taking warfarin. Avoid major changes in your diet. If you are going to change your diet, talk with your health care provider before making changes.  Your health care provider may recommend that you work with a diet and nutrition specialist (dietitian).  Vitamin K decreases the effect of warfarin, and it is found in many foods. Eat a consistent amount of foods that contain vitamin K. For example, you may decide to eat 2 vitamin K-containing foods each day. Most foods that are high in vitamin K are green and leafy. Common foods that contain high amounts of vitamin K include:  Kale, raw or cooked.  Spinach, raw or cooked.  Collards, raw or cooked.  Swiss chard, raw or cooked.  Mustard greens, raw or cooked.  Turnip greens, raw or cooked.  Parsley, raw.  Broccoli,  cooked.  Noodles, eggs, and spinach, enriched.  Brussels sprouts, raw or cooked.  Beet greens, raw or cooked.  Endive, raw.  Cabbage, cooked.  Asparagus, cooked. Foods that contain moderate amounts of vitamin K include:  Broccoli, raw.  Cabbage, raw.  Bok choy, cooked.  Green leaf lettuce, raw  Prunes, stewed.  Rosita Fire.  Kiwi.  Edamame, cooked.  Romaine lettuce, raw.  Avocado.  Tuna, canned in oil.  Okra, cooked.  Black-eyed peas, cooked.  Green beans, cooked or raw.  Blueberries, raw.  Blackberries, raw.  Peas, cooked or raw. Contact a health care provider if:  You miss a dose.  You take an extra dose.  You plan to have any kind of surgery or procedure.  You are unable to take your medicine due to nausea, vomiting, or diarrhea.  You have any major changes in your diet or you plan to make any major changes in your diet.  You start or stop any over-the-counter medicine, prescription medicine, or dietary supplement.  You become pregnant, plan to become pregnant, or think you may be pregnant.  You have menstrual periods that are heavier than usual.  You have unusual bruising. Get help right away if:  You develop symptoms of an allergic reaction, such as:  Swelling of the lips, face, tongue, mouth, or throat.  Rash.  Itching.  Itchy, red, swollen areas of skin (  hives).  Trouble breathing.  Chest tightness.  You have:  Signs or symptoms of a stroke.  Signs or symptoms of a blood clot.  A fall or have an accident, especially if you hit your head.  Blood in your urine. Your urine may look reddish, pinkish, or tea-colored.  Blood in your stool. Your stool may be black or bright red.  Bleeding that does not stop after applying pressure to the area for 30 minutes.  Severe pain in your joints or back.  Purple or blue toes.  Skin ulcers that do not go away.  You vomit blood or cough up blood. The blood may be bright red, or it  may look like coffee grounds. These symptoms may represent a serious problem that is an emergency. Do not wait to see if the symptoms will go away. Get medical help right away. Call your local emergency services (911 in the [place] Do not drive yourself to the hospital.  Summary  Warfarin needs to be closely monitored with blood tests. It is very important to keep all lab visits and follow-up visits with your health care provider.  Make sure that you know your target INR range and your warfarin dosage.  Wear or carry identification that says that you are taking warfarin.  Take warfarin at the same time every day. Call your health care provider if you miss a dose or if you take an extra dose. Do not change the dosage of warfarin on your own.  Know the signs and symptoms of blood clots, bleeding, and a stroke. Know when to get emergency medical help.  Tell all health care providers who care for you that you are taking warfarin.  Talk with your health care provider or your pharmacist before starting or stopping any new medicines.  Monitor how much vitamin K you eat every day. Try to eat the same amount every day. This information is not intended to replace advice given to you by your health care provider. Make sure you discuss any questions you have with your health care provider. Document Released: 02/28/2005 Document Revised: 11/10/2015 Document Reviewed: 05/27/2015 Elsevier Interactive Patient Education  2017 [employer].

## 2016-05-09 DIAGNOSIS — Z952 Presence of prosthetic heart valve: Secondary | ICD-10-CM

## 2016-05-09 LAB — PROTIME-INR
INR: 1.15
Prothrombin Time: 14.8 seconds (ref 11.4–15.2)

## 2016-05-09 MED ORDER — WARFARIN SODIUM 5 MG PO TABS
5.0000 mg | ORAL_TABLET | Freq: Every day | ORAL | Status: DC
  Administered 2016-05-09: 5 mg via ORAL
  Filled 2016-05-09: qty 1

## 2016-05-09 NOTE — Progress Notes (Signed)
CARDIAC REHAB PHASE I   PRE:  Rate/Rhythm: 54 SB    BP: sitting 125/80    SaO2: 91-92 RA  MODE:  Ambulation: 1040 ft   POST:  Rate/Rhythm: 78 SR    BP: sitting 108/98, recheck 113/91     SaO2: 92 RA  Pt moving very well, independently. Quick pace. HR stable, no c/o. Rest x1. To recliner. Systolic low however diastolic elevated, see above. Discussed CRPII and will send referral to [place]. Also discussed diet with Coumadin and IS. Will f/u tomorrow. Can walk independently. [id number]   Harriet Masson CES, ACSM 05/09/2016 9:11 AM

## 2016-05-09 NOTE — Progress Notes (Signed)
CCMD notified that Pt converted to NSR at 0250 this am.

## 2016-05-09 NOTE — Progress Notes (Addendum)
Subjective:    Ambulating vigorously. Denies chest pain. No orthopnea or PND. Denies dyspnea. Denies palpitations.  Objective:   Weight Range:  Vital Signs:   Temp:  [98.3 F (36.8 C)-98.8 F (37.1 C)] 98.5 F (36.9 C) (02/26 0619) Pulse Rate:  [65-104] 65 (02/26 0619) Resp:  [17-20] 20 (02/26 0619) BP: (90-132)/(64-94) 132/83 (02/26 0619) SpO2:  [91 %-94 %] 91 % (02/26 0619) Weight:  [150 lb (68 kg)-151 lb 1.6 oz (68.5 kg)] 150 lb (68 kg) (02/26 0619) Last BM Date: 05/05/16  Weight change: Filed Weights   05/08/16 0500 05/08/16 0959 05/09/16 0619  Weight: 154 lb 12.2 oz (70.2 kg) 151 lb 1.6 oz (68.5 kg) 150 lb (68 kg)    Intake/Output:   Intake/Output Summary (Last 24 hours) at 05/09/16 0911 Last data filed at 05/08/16 1830  Gross per 24 hour  Intake              360 ml  Output                0 ml  Net              360 ml     Physical Exam: General:  Sitting in bed No resp difficulty HEENT: normal Neck: supple.  JVP while standing at the sink is not visible Cor: There is a scratchy right upper sternal systolic murmur. The wound is healing well. Rhythm is regular  Lungs: clear Abdomen: soft, nontender, nondistended. No hepatosplenomegaly. No bruits or masses. Good bowel sounds. Extremities: no cyanosis, clubbing, rash, no edema Neuro: alert & orientedx3, cranial nerves grossly intact. moves all 4 extremities w/o difficulty. Affect pleasant  Telemetry: Converted to normal sinus rhythm at 2 AM- personally reviewed.   Electrocardiogram: Last performed on 05/04/16 showing sinus rhythm with interventricular conduction delay. -Personally reviewed  Labs: Basic Metabolic Panel:  Recent Labs Lab 05/03/16 1837  05/04/16 0413 05/04/16 1642 05/04/16 1645 05/05/16 0430 05/06/16 0355 05/07/16 0345 05/08/16 0304  NA  --   < > 135 136  --  133* 135 138 138  K  --   < > 4.5 4.8  --  4.5 3.7 3.8 4.1  CL  --   < > 105 101  --  101 101 101 97*  CO2  --   --  21*  --    --  25 28 31  33*  GLUCOSE  --   < > 112* 132*  --  141* 102* 97 92  BUN  --   < > 9 14  --  9 10 12 11   CREATININE 0.90  < > 0.84 0.80 0.91 0.75 0.82 0.87 0.98  CALCIUM  --   < > 7.4*  --   --  8.4* 8.2* 8.0* 8.4*  MG 2.9*  --  2.3  --  2.5*  --   --   --   --   < > = values in this interval not displayed.  CBC:  Recent Labs Lab 05/04/16 1645 05/05/16 0430 05/06/16 0355 05/07/16 0345 05/08/16 0304  WBC 11.8* 12.7* 7.9 6.9 6.3  HGB 11.0* 11.1* 10.1* 9.9* 10.3*  HCT 34.0* 34.8* 31.5* 30.9* 32.7*  MCV 92.4 92.3 92.1 93.6 93.7  PLT 113* 119* 108* 140* 200    Cardiac Enzymes: No results for input(s): CKTOTAL, CKMB, CKMBINDEX, TROPONINI in the last 168 hours.  BNP: BNP (last 3 results) No results for input(s): BNP in the last 8760 hours.  ProBNP (last  3 results) No results for input(s): PROBNP in the last 8760 hours.    Other results:  Imaging: Dg Chest 2 View  Result Date: 05/08/2016 CLINICAL DATA:  Pleural effusion; s/p aortic valve replacement 05/03/16; former smoker EXAM: CHEST  2 VIEW COMPARISON:  05/07/2016 FINDINGS: Changes from cardiac surgery stable. There is no mediastinal widening. There persistent lung base opacities, left greater than right, most likely a combination of atelectasis and small pleural effusions. There is no pulmonary edema. No new lung abnormalities. No pneumothorax. Since the prior study, the right internal jugular Cordis has been removed. IMPRESSION: 1. No acute finding or evidence of an operative complication. 2. Persistent left greater than right lung base opacity consistent with combination of atelectasis and small effusions. Electronically Signed   By: Amie Portland M.D.   On: 05/08/2016 10:16     Echocardiogram 02/08/16: Study Conclusions  - Left ventricle: The cavity size was moderately dilated. Wall   thickness was normal. Systolic function was mildly to moderately   reduced. The estimated ejection fraction was in the range of 40%    to 45%. Mild diffuse hypokinesis with no identifiable regional   variations. Features are consistent with a pseudonormal left   ventricular filling pattern, with concomitant abnormal relaxation   and increased filling pressure (grade 2 diastolic dysfunction). - Aortic valve: Possibly bicuspid; mildly thickened, mildly   calcified leaflets. There was very mild stenosis. There was   severe regurgitation directed eccentrically in the LVOT and   towards the mitral anterior leaflet. - Mitral valve: There was mild regurgitation. - Left atrium: The atrium was mildly dilated. - Atrial septum: No defect or patent foramen ovale was identified.    Medications:     Scheduled Medications: . [START ON 05/14/2016] amiodarone  200 mg Oral BID  . amiodarone  400 mg Oral BID  . amphetamine-dextroamphetamine  20 mg Oral Daily  . aspirin EC  81 mg Oral Daily  . bisacodyl  10 mg Oral Daily   Or  . bisacodyl  10 mg Rectal Daily  . docusate sodium  200 mg Oral Daily  . enoxaparin (LOVENOX) injection  30 mg Subcutaneous Q24H  . furosemide  40 mg Oral Daily  . metoprolol tartrate  25 mg Oral Q8H  . moving right along book   Does not apply Once  . pantoprazole  40 mg Oral Daily  . patient's guide to using coumadin book   Does not apply Once  . potassium chloride  20 mEq Oral Daily  . progesterone  100 mg Oral QHS  . sodium chloride flush  3 mL Intravenous Q12H  . sodium chloride flush  3 mL Intravenous Q12H  . warfarin  5 mg Oral q1800  . Warfarin - Physician Dosing Inpatient   Does not apply q1800    Infusions: . sodium chloride 250 mL (05/04/16 0617)  . lactated ringers Stopped (05/07/16 0955)      Assessment:   1. 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 No aortic regurgitation is heard.  2. PAF - occurring postoperatively. Reverted to sinus rhythm last p.m. Oral amiodarone is now being given a current dose of 400 mg twice a day. This dose can be reduced to  200 mg twice a day at discharge and down to 200 mg daily after 2 weeks. Amiodarone continued for 4-6 weeks following surgery and then discontinued.  3. Acute systolic/diastolic HF - ambulating freely without difficulty. No evidence of volume overload.  Plan/Discussion:    Now in sinus rhythm. Amiodarone can be decreased to 200 mg by mouth twice a day at discharge and down to 200 mg daily after 2 weeks. Goal is to discontinue 6-8 weeks post discharge.  Tolerating diuretic therapy without difficulty. Follow volume status. Monitor kidney function on Lasix.  Continue to ambulate.   Length of Stay: 6  Lyn Records III MD 05/09/2016, 9:11 AM

## 2016-05-09 NOTE — Progress Notes (Addendum)
      [address]       [place] [address]             [phone]      6 Days Post-Op Procedure(s) (LRB): AORTIC VALVE REPLACEMENT (AVR) WITH MAGNA EASE PERICARDIAL BIOPROSTHESIS - AORTIC 21 MM MODEL 3300TFX (N/A) TRANSESOPHAGEAL ECHOCARDIOGRAM (TEE) (N/A)   Subjective:  Complains of headache.  No further nausea.  Objective: Vital signs in last 24 hours: Temp:  [98.3 F (36.8 C)-98.8 F (37.1 C)] 98.5 F (36.9 C) (02/26 0619) Pulse Rate:  [65-104] 65 (02/26 0619) Cardiac Rhythm: Sinus bradycardia (02/26 0716) Resp:  [17-20] 20 (02/26 0619) BP: (90-132)/(64-94) 132/83 (02/26 0619) SpO2:  [91 %-94 %] 91 % (02/26 0619) Weight:  [150 lb (68 kg)-151 lb 1.6 oz (68.5 kg)] 150 lb (68 kg) (02/26 0619)  Intake/Output from previous day: 02/25 0701 - 02/26 0700 In: 600 [P.O.:600] Out: -   General appearance: alert, cooperative and no distress Heart: regular rate and rhythm Lungs: clear to auscultation bilaterally Abdomen: soft, non-tender; bowel sounds normal; no masses,  no organomegaly Extremities: edema trace Wound: clean and dry  Lab Results:  Recent Labs  05/07/16 0345 05/08/16 0304  WBC 6.9 6.3  HGB 9.9* 10.3*  HCT 30.9* 32.7*  PLT 140* 200   BMET:  Recent Labs  05/07/16 0345 05/08/16 0304  NA 138 138  K 3.8 4.1  CL 101 97*  CO2 31 33*  GLUCOSE 97 92  BUN 12 11  CREATININE 0.87 0.98  CALCIUM 8.0* 8.4*    PT/INR:  Recent Labs  05/09/16 0208  LABPROT 14.8  INR 1.15   ABG    Component Value Date/Time   PHART 7.314 (L) 05/03/2016 1841   HCO3 20.8 05/03/2016 1841   TCO2 23 05/04/2016 1642   ACIDBASEDEF 5.0 (H) 05/03/2016 1841   O2SAT 67.0 05/07/2016 0350   CBG (last 3)   Recent Labs  05/06/16 1141 05/06/16 1206 05/06/16 1605  GLUCAP 62* 90 91    Assessment/Plan: S/P Procedure(s) (LRB): AORTIC VALVE REPLACEMENT (AVR) WITH MAGNA EASE PERICARDIAL BIOPROSTHESIS - AORTIC 21 MM MODEL 3300TFX (N/A) TRANSESOPHAGEAL  ECHOCARDIOGRAM (TEE) (N/A)  1. CV- PAF, currently Sinus Brady-continue Amiodarone, Lopressor... INR 1.15, repeat coumadin at 5 mg if no rise in INR will increase dose tomorrow 2. Pulm- continue aggressive pulm toilet 3. Renal- creatinine WNL, remains hypervolemic, continue Lasix, Hypokalemia resolved 4. Thrombocytopenia- improved 5. Dispo- patient stable, will remain in hospital today to monitor bradycardia, plan for discharge in AM if remains stable   LOS: 6 days    [name], [name] 05/09/2016  Now back in sinus, coumadin started  but not therapeutic yet  Poss. Home in am I have seen and examined Wanda [name] and agree with the above assessment  and plan.  [name] MD Beeper [phone] Office [phone] 05/09/2016 12:33 PM

## 2016-05-10 LAB — BASIC METABOLIC PANEL
Anion gap: 7 (ref 5–15)
BUN: 12 mg/dL (ref 6–20)
CALCIUM: 8.5 mg/dL — AB (ref 8.9–10.3)
CO2: 34 mmol/L — ABNORMAL HIGH (ref 22–32)
CREATININE: 1.03 mg/dL — AB (ref 0.44–1.00)
Chloride: 96 mmol/L — ABNORMAL LOW (ref 101–111)
GFR, EST NON AFRICAN AMERICAN: 57 mL/min — AB (ref >60)
Glucose, Bld: 97 mg/dL (ref 65–99)
Potassium: 4 mmol/L (ref 3.5–5.1)
SODIUM: 137 mmol/L (ref 135–145)

## 2016-05-10 LAB — PROTIME-INR
INR: 1.45
PROTHROMBIN TIME: 17.7 s — AB (ref 11.4–15.2)

## 2016-05-10 MED ORDER — WARFARIN SODIUM 5 MG PO TABS: 5.0000 mg | ORAL_TABLET | Freq: Every day | ORAL | 3 refills | Status: DC

## 2016-05-10 MED ORDER — ASPIRIN 81 MG PO TBEC: 81.0000 mg | DELAYED_RELEASE_TABLET | Freq: Every day | ORAL | Status: DC

## 2016-05-10 MED ORDER — POTASSIUM CHLORIDE CRYS ER 20 MEQ PO TBCR: 20.0000 meq | EXTENDED_RELEASE_TABLET | Freq: Every day | ORAL | 0 refills | Status: DC

## 2016-05-10 MED ORDER — AMIODARONE HCL 200 MG PO TABS: 200.0000 mg | ORAL_TABLET | Freq: Two times a day (BID) | ORAL | 1 refills | Status: DC

## 2016-05-10 MED ORDER — METOPROLOL TARTRATE 25 MG PO TABS: 25.0000 mg | ORAL_TABLET | Freq: Three times a day (TID) | ORAL | 3 refills | Status: DC

## 2016-05-10 MED ORDER — TRAMADOL HCL 50 MG PO TABS: 50.0000 mg | ORAL_TABLET | ORAL | 0 refills | Status: DC | PRN

## 2016-05-10 MED ORDER — FUROSEMIDE 40 MG PO TABS: 40.0000 mg | ORAL_TABLET | Freq: Every day | ORAL | 0 refills | Status: DC

## 2016-05-10 NOTE — Progress Notes (Signed)
Ed completed with pt. Voiced understanding. Set up d/c video for her. [id number] Ethelda Chick CES, ACSM 8:23 AM 05/10/2016

## 2016-05-10 NOTE — Care Management Note (Signed)
Case Management Note Donn Pierini RN, BSN Unit 2W-Case Manager [phone]  Patient Details  Name: Wanda [name] MRN: [id number] Date of Birth: 01-07-1951  Subjective/Objective:  Pt admitted s/p AVR                  Action/Plan: PTA pt lived at home with spouse- plan to return  Home with spouse- no CM needs noted for discharge.   Expected Discharge Date:  05/10/16               Expected Discharge Plan:  Home/Self Care  In-House Referral:     Discharge planning Services  CM Consult  Post Acute Care Choice:  NA Choice offered to:  NA  DME Arranged:    DME Agency:     HH Arranged:    HH Agency:     Status of Service:  Completed, signed off  If discussed at [employer] of Stay Meetings, dates discussed:    Additional Comments:  Wanda Span, RN 05/10/2016, 11:07 AM

## 2016-05-10 NOTE — Progress Notes (Signed)
      [address]       [place] [address]             [phone]      7 Days Post-Op Procedure(s) (LRB): AORTIC VALVE REPLACEMENT (AVR) WITH MAGNA EASE PERICARDIAL BIOPROSTHESIS - AORTIC 21 MM MODEL 3300TFX (N/A) TRANSESOPHAGEAL ECHOCARDIOGRAM (TEE) (N/A)   Subjective:  Complains of headache.  No new issues.  + ambulation   + BM  Objective: Vital signs in last 24 hours: Temp:  [98.1 F (36.7 C)-98.7 F (37.1 C)] 98.5 F (36.9 C) (02/27 0423) Pulse Rate:  [54-63] 54 (02/27 0423) Cardiac Rhythm: Heart block (02/26 1900) Resp:  [18] 18 (02/27 0423) BP: (109-122)/(60-91) 117/79 (02/27 0423) SpO2:  [92 %-94 %] 92 % (02/27 0423) Weight:  [147 lb 11.2 oz (67 kg)] 147 lb 11.2 oz (67 kg) (02/27 0423)  Intake/Output from previous day: 02/26 0701 - 02/27 0700 In: 240 [P.O.:240] Out: -   General appearance: alert, cooperative and no distress Heart: regular rate and rhythm Lungs: clear to auscultation bilaterally Abdomen: soft, non-tender; bowel sounds normal; no masses,  no organomegaly Extremities: edema trace Wound: clean and dry  Lab Results:  Recent Labs  05/08/16 0304  WBC 6.3  HGB 10.3*  HCT 32.7*  PLT 200   BMET:  Recent Labs  05/08/16 0304 05/10/16 0305  NA 138 137  K 4.1 4.0  CL 97* 96*  CO2 33* 34*  GLUCOSE 92 97  BUN 11 12  CREATININE 0.98 1.03*  CALCIUM 8.4* 8.5*    PT/INR:  Recent Labs  05/10/16 0305  LABPROT 17.7*  INR 1.45   ABG    Component Value Date/Time   PHART 7.314 (L) 05/03/2016 1841   HCO3 20.8 05/03/2016 1841   TCO2 23 05/04/2016 1642   ACIDBASEDEF 5.0 (H) 05/03/2016 1841   O2SAT 67.0 05/07/2016 0350   CBG (last 3)  No results for input(s): GLUCAP in the last 72 hours.  Assessment/Plan: S/P Procedure(s) (LRB): AORTIC VALVE REPLACEMENT (AVR) WITH MAGNA EASE PERICARDIAL BIOPROSTHESIS - AORTIC 21 MM MODEL 3300TFX (N/A) TRANSESOPHAGEAL ECHOCARDIOGRAM (TEE) (N/A)  1. CV- PAF, currently maintaining  Sinus Huston Foley- will decrease Amiodarone for discharge, continue Amiodarone 2. INR 1.45, will continue at 5 mg daily 3. Pulm- no acute issues, continue IS 4. Renal- creatinine stable at 1.03, weight trending down will taper lasix 5. Dispo- patient stable, maintaining NSR, continue coumadin, will d/c home today   LOS: 7 days    Wanda [name], Wanda [name] 05/10/2016

## 2016-05-10 NOTE — Progress Notes (Signed)
Chest tube sutures removed per MD order. Sites clean and dry. Benzoin and steri-strips applied.   Berdine Dance BSN, RN

## 2016-05-10 NOTE — Progress Notes (Signed)
Pt has been discharged home with husband. IV and telemetry box removed. Pt and pt's husband received discharge instructions and all questions were answered. Pt received prescriptions for her medications and was instructed to get them filled at her pharmacy. Pt verbalized understanding. Pt discharged with all of her belongings. Pt left the unit via wheelchair and was accompanied by this RN and a Theatre stage manager.  Berdine Dance BSN, RN

## 2016-05-11 ENCOUNTER — Telehealth (HOSPITAL_COMMUNITY): Payer: Self-pay | Admitting: Family Medicine

## 2016-05-11 NOTE — Telephone Encounter (Signed)
Insurance benefits verified. Patient has [employer]: no co-payment, deductible $1250.00/deductible has been met, out of pocket $4350.00/$3943.64 has been met and no co-insurance. No pre-authorization and no limit on visit. Passport/Reference # [id number]. Information given to [facility] for review.

## 2016-05-12 ENCOUNTER — Telehealth: Payer: Self-pay | Admitting: Physician Assistant

## 2016-05-12 NOTE — Telephone Encounter (Signed)
      [address]       [place] [address]             [phone]    Wanda [name] [phone]   S/P AVR (tissue valve) performed on  05/03/2016 by Dr. Tyrone Sage.  Discharged home on 05/10/2016.  Medications:  Coumadin:  On Coumadin for post op atrial fibrillation. She is either going to get her INR drawn later today or in am. I told her it is to be obtained no later than tomorrow am.  Problems/Concerns: She has a headache today  Assessment:  Patient is doing ok.   Contact office if concerns or problems develop  Follow up Appointment:  Dr. Tyrone Sage on 06/16/2016 at 2:30 pm. I am going to obtain a cardiology appointment for her and leave it on her husband's voice mail.

## 2016-05-13 ENCOUNTER — Ambulatory Visit (INDEPENDENT_AMBULATORY_CARE_PROVIDER_SITE_OTHER): Payer: BC Managed Care – PPO

## 2016-05-13 DIAGNOSIS — I4891 Unspecified atrial fibrillation: Secondary | ICD-10-CM

## 2016-05-13 DIAGNOSIS — Z952 Presence of prosthetic heart valve: Secondary | ICD-10-CM | POA: Diagnosis not present

## 2016-05-13 DIAGNOSIS — I4819 Other persistent atrial fibrillation: Secondary | ICD-10-CM

## 2016-05-13 DIAGNOSIS — Z5181 Encounter for therapeutic drug level monitoring: Secondary | ICD-10-CM

## 2016-05-13 HISTORY — DX: Other persistent atrial fibrillation: I48.19

## 2016-05-13 LAB — POCT INR: INR: 3.3

## 2016-05-13 NOTE — Patient Instructions (Signed)

## 2016-05-19 ENCOUNTER — Ambulatory Visit (INDEPENDENT_AMBULATORY_CARE_PROVIDER_SITE_OTHER): Payer: BC Managed Care – PPO | Admitting: *Deleted

## 2016-05-19 DIAGNOSIS — Z952 Presence of prosthetic heart valve: Secondary | ICD-10-CM | POA: Diagnosis not present

## 2016-05-19 DIAGNOSIS — Z5181 Encounter for therapeutic drug level monitoring: Secondary | ICD-10-CM | POA: Diagnosis not present

## 2016-05-19 DIAGNOSIS — I4891 Unspecified atrial fibrillation: Secondary | ICD-10-CM | POA: Diagnosis not present

## 2016-05-19 LAB — POCT INR: INR: 3.2

## 2016-05-25 ENCOUNTER — Encounter: Payer: Self-pay | Admitting: Physician Assistant

## 2016-05-25 ENCOUNTER — Ambulatory Visit (INDEPENDENT_AMBULATORY_CARE_PROVIDER_SITE_OTHER): Payer: BC Managed Care – PPO | Admitting: Physician Assistant

## 2016-05-25 ENCOUNTER — Ambulatory Visit (INDEPENDENT_AMBULATORY_CARE_PROVIDER_SITE_OTHER): Payer: BC Managed Care – PPO | Admitting: *Deleted

## 2016-05-25 ENCOUNTER — Encounter (INDEPENDENT_AMBULATORY_CARE_PROVIDER_SITE_OTHER): Payer: Self-pay

## 2016-05-25 VITALS — BP 128/70 | HR 54 | Ht 64.0 in | Wt 142.8 lb

## 2016-05-25 DIAGNOSIS — I481 Persistent atrial fibrillation: Secondary | ICD-10-CM | POA: Diagnosis not present

## 2016-05-25 DIAGNOSIS — I4891 Unspecified atrial fibrillation: Secondary | ICD-10-CM | POA: Diagnosis not present

## 2016-05-25 DIAGNOSIS — I5022 Chronic systolic (congestive) heart failure: Secondary | ICD-10-CM

## 2016-05-25 DIAGNOSIS — Z952 Presence of prosthetic heart valve: Secondary | ICD-10-CM | POA: Diagnosis not present

## 2016-05-25 DIAGNOSIS — I4819 Other persistent atrial fibrillation: Secondary | ICD-10-CM

## 2016-05-25 DIAGNOSIS — I5042 Chronic combined systolic (congestive) and diastolic (congestive) heart failure: Secondary | ICD-10-CM | POA: Insufficient documentation

## 2016-05-25 DIAGNOSIS — Z5181 Encounter for therapeutic drug level monitoring: Secondary | ICD-10-CM | POA: Diagnosis not present

## 2016-05-25 DIAGNOSIS — I493 Ventricular premature depolarization: Secondary | ICD-10-CM | POA: Insufficient documentation

## 2016-05-25 HISTORY — DX: Chronic systolic (congestive) heart failure: I50.22

## 2016-05-25 HISTORY — DX: Ventricular premature depolarization: I49.3

## 2016-05-25 LAB — POCT INR: INR: 2.5

## 2016-05-25 MED ORDER — AMIODARONE HCL 200 MG PO TABS: 200.0000 mg | ORAL_TABLET | Freq: Every day | ORAL | 3 refills | Status: DC

## 2016-05-25 MED ORDER — METOPROLOL TARTRATE 25 MG PO TABS: 37.5000 mg | ORAL_TABLET | Freq: Two times a day (BID) | ORAL | 3 refills | Status: DC

## 2016-05-25 NOTE — Patient Instructions (Addendum)
Medication Instructions:  Your physician has recommended you make the following change in your medication: Decrease amiodarone to 200 mg once a day. Change metoprolol to 37.5 mg twice a day. (this is one and a half tablets twice a day)  Labwork: None ordered  Testing/Procedures: Your physician has requested that you have an echocardiogram. Echocardiography is a painless test that uses sound waves to create images of your heart. It provides your doctor with information about the size and shape of your heart and how well your heart's chambers and valves are working. This procedure takes approximately one hour. There are no restrictions for this procedure. To be done in 6 weeks.  Follow-Up: Your physician recommends that you schedule a follow-up appointment in: Please follow up with Dr. Excell Seltzer 6-8 weeks after the echocardiogram.   Any Other Special Instructions Will Be Listed Below (If Applicable).  If you need a refill on your cardiac medications before your next appointment, please call your pharmacy.

## 2016-05-25 NOTE — Progress Notes (Signed)
Cardiology Office Note:    Date:  05/25/2016   ID:  NOVICE VILLARROEL, DOB 01-02-1951, MRN [id number]  PCP:  Betty Swaziland, MD  Cardiologist:  Dr. Tonny Bollman   Electrophysiologist:  n/a  Referring MD: Swaziland, [name], MD   Chief Complaint  Patient presents with  . Hospitalization Follow-up    s/p AVR    History of Present Illness:    Wanda [name] is a 66 y.o. female with a hx of aortic insufficiency, combined systolic and diastolic CHF, non-ischemic dilated CM.  She was felt to be symptomatic from her aortic insufficiency with associated cardiomyopathy. Aortic valve replacement was recommended. Cardiac catheterization in 1/18 demonstrated no significant CAD. She was admitted 2/20-2/27 and underwent bioprosthetic AVR by Dr. Tyrone Sage. Postoperative course was notable for AF with RVR requiring IV amiodarone. She was transitioned to oral amiodarone and remained in sinus rhythm at discharge. She did require initiation of Coumadin. Dr. Excell Seltzer did see the patient in the hospital and recommended at least 6-8 weeks of anticoagulation.  CHADS2-VASc=2 (female, CHF).    She returns for follow up.  She is here with her husband.  She has been doing fairly well since her surgery.  Her chest soreness is improved.  She denies significant shortness of breath.  She denies orthopnea, PND, edema, syncope, dizziness.    Prior CV studies:   The following studies were reviewed today:  Intraop TEE 05/03/16 EF 35-40, severe AI, mild MR  Carotid US 2/18 No ICA stenosis  R/L [facility] 1/18 LAD irregs prox 20 Normal R heart hemodynamics  Holter 11/17 The basic rhythm is sinus. There are frequent PVCs, periods of ventricular bigeminy, and occasional ventricular couplets and triplets.  Echo 11/17 EF 40-45, mild diff HK, Gr 2 DD, possibly bicuspid AV with very mild AS and severe AI, mild MR, mild LAE  Past Medical History:  Diagnosis Date  . Anxiety   . Arthritis   . Chronic systolic heart failure (HCC)  2/64/1583  . Depression   . Migraine   . Persistent atrial fibrillation (HCC) 05/13/2016   Post AVR // Amiodarone // Coumadin started; possible duration 6-8 weeks  . PVC's (premature ventricular contractions) 05/25/2016   Holter 11/17 The basic rhythm is sinus. There are frequent PVCs, periods of ventricular bigeminy, and occasional ventricular couplets and triplets.  . S/P AVR (aortic valve replacement)    Echo 11/17:  EF 40-45, mild diff HK, Gr 2 DD, possibly bicuspid AV with very mild AS and severe AI, mild MR, mild LAE // R/L [facility] 1/18: LAD irregs prox 20, Normal R heart hemodynamics // Intraop TEE 05/03/16:  EF 35-40, severe AI, mild MR  . S/P AVR (aortic valve replacement)    2/2 aortic insufficiency // Echo 11/17:  EF 40-45, mild diff HK, Gr 2 DD, possibly bicuspid AV with very mild AS and severe AI, mild MR, mild LAE // R/L [facility] 1/18: LAD irregs prox 20, Normal R heart hemodynamics // Intraop TEE 05/03/16:  EF 35-40, severe AI, mild MR    Past Surgical History:  Procedure Laterality Date  . AORTIC VALVE REPLACEMENT N/A 05/03/2016   Procedure: AORTIC VALVE REPLACEMENT (AVR) WITH MAGNA EASE PERICARDIAL BIOPROSTHESIS - AORTIC 21 MM MODEL 3300TFX;  Surgeon: [name], MD;  Location: [facility] OR;  Service: Open Heart Surgery;  Laterality: N/A;  . TEE WITHOUT CARDIOVERSION N/A 05/03/2016   Procedure: TRANSESOPHAGEAL ECHOCARDIOGRAM (TEE);  Surgeon: [name], MD;  Location: [facility] OR;  Service: Open Heart  Surgery;  Laterality: N/A;  . TONSILLECTOMY AND ADENOIDECTOMY      Current Medications: Current Meds  Medication Sig  . amphetamine-dextroamphetamine (ADDERALL) 20 MG tablet Take 20 mg by mouth daily. Reported on 08/18/2015  . aspirin EC 81 MG EC tablet Take 1 tablet (81 mg total) by mouth daily.  Marland Kitchen estradiol (MINIVELLE) 0.05 MG/24HR patch Place 1 patch (0.05 mg total) onto the skin 2 (two) times a week.  Marland Kitchen LORazepam (ATIVAN) 0.5 MG tablet Take 0.5 mg by mouth daily as needed for anxiety.   .  progesterone (PROMETRIUM) 100 MG capsule Take 100 mg by mouth at bedtime.  . rizatriptan (MAXALT-MLT) 10 MG disintegrating tablet Take 10 mg by mouth once as needed for migraine. May repeat in 2 hours if needed  . traMADol (ULTRAM) 50 MG tablet Take 1-2 tablets (50-100 mg total) by mouth every 4 (four) hours as needed for moderate pain.  Marland Kitchen warfarin (COUMADIN) 5 MG tablet Take 1 tablet (5 mg total) by mouth daily at 6 PM.  . [DISCONTINUED] amiodarone (PACERONE) 200 MG tablet Take 1 tablet (200 mg total) by mouth 2 (two) times daily. For 7 Days, then decrease to 200 mg daily  . [DISCONTINUED] metoprolol tartrate (LOPRESSOR) 25 MG tablet Take 1 tablet (25 mg total) by mouth every 8 (eight) hours.     Allergies:   Codeine and Vicodin [hydrocodone-acetaminophen]   Social History   Social History  . Marital status: Married    Spouse name: N/A  . Number of children: N/A  . Years of education: N/A   Social History Main Topics  . Smoking status: Former Games developer  . Smokeless tobacco: Never Used     Comment: in college  . Alcohol use Yes     Comment: 1-2 glasses of wine  . Drug use: No  . Sexual activity: Yes    Partners: Male    Birth control/ protection: Other-see comments, Post-menopausal     Comment: vasectomy   Other Topics Concern  . None   Social History Narrative  . None     Family History  Problem Relation Age of Onset  . Diabetes Mother   . Ovarian cancer Maternal Grandmother 80  . Breast cancer Sister 47  . Congestive Heart Failure Maternal Grandfather   . Heart disease Brother     valve replacement  . Deep vein thrombosis Father   . Deep vein thrombosis Brother   . Deep vein thrombosis Paternal Aunt     x2  . Deep vein thrombosis Paternal Grandmother   . Lung cancer Brother      ROS:   Please see the history of present illness.    ROS All other systems reviewed and are negative.   EKGs/Labs/Other Test Reviewed:    EKG:  EKG is  ordered today.  The ekg  ordered today demonstrates Sinus brady, HR 54, IVCD, LAD, QTc 474 ms, TWI in V5-6  Recent Labs: 08/18/2015: TSH 1.39 04/29/2016: ALT 12 05/04/2016: Magnesium 2.5 05/08/2016: Hemoglobin 10.3; Platelets 200 05/10/2016: BUN 12; Creatinine, Ser 1.03; Potassium 4.0; Sodium 137   Recent Lipid Panel    Component Value Date/Time   CHOL 247 (H) 08/18/2015 1600   TRIG 73 08/18/2015 1600   HDL 108 08/18/2015 1600   CHOLHDL 2.3 08/18/2015 1600   VLDL 15 08/18/2015 1600   LDLCALC 124 08/18/2015 1600     Physical Exam:    VS:  BP 128/70   Pulse (!) 54   Ht 5\' 4"  (1.626  m)   Wt 142 lb 12.8 oz (64.8 kg)   LMP 08/12/2004   BMI 24.51 kg/m     Wt Readings from Last 3 Encounters:  05/25/16 142 lb 12.8 oz (64.8 kg)  05/10/16 147 lb 11.2 oz (67 kg)  04/29/16 143 lb 1.6 oz (64.9 kg)     Physical Exam  Constitutional: She is oriented to person, place, and time. She appears well-developed and well-nourished. No distress.  HENT:  Head: Normocephalic and atraumatic.  Eyes: No scleral icterus.  Neck: Normal range of motion. No JVD present.  Cardiovascular: Normal rate, regular rhythm, S1 normal and S2 normal.   Murmur heard.  Medium-pitched systolic murmur is present with a grade of 2/6  at the upper right sternal border Pulmonary/Chest: Breath sounds normal. She has no wheezes. She has no rhonchi. She has no rales.  Median sternotomy well healed without erythema or d/c  Abdominal: Soft. There is no tenderness.  Musculoskeletal: She exhibits no edema.  Neurological: She is alert and oriented to person, place, and time.  Skin: Skin is warm and dry.  Psychiatric: She has a normal mood and affect.    ASSESSMENT:    1. S/P AVR   2. Persistent atrial fibrillation (HCC)   3. Chronic systolic heart failure (HCC)    PLAN:    In order of problems listed above:  1. S/P AVR -  She is progressing well since her aortic valve replacement. She is eager to start cardiac rehabilitation. I will touch  base with Dr. Tyrone Sage as she does not see him until mid April. If he has no objections, I will go ahead and refer her now. Arrange follow-up echocardiogram in 6-8 weeks.  2. Persistent atrial fibrillation (HCC) -  She had postoperative atrial fibrillation with rapid ventricular rate that was difficult to control. She eventually converted back to normal sinus rhythm. She was placed on Coumadin. Hopefully, she will only need to remain on this for 6-8 weeks. She does not really recall having symptoms with this. We may need to consider event monitoring after discontinuation of amiodarone to assess for recurrent atrial fibrillation before stopping Coumadin (which would likely mean 3-6 mos of Coumadin).    -  Decrease Amiodarone to 200 mg QD  -  Change Metoprolol Tartrate to 37.5 bid   3. Chronic systolic heart failure (HCC) -  Dilated cardiomyopathy is nonischemic and likely related to severe aortic insufficiency. She is on beta blocker therapy. She is somewhat hesitant about taking multiple medications. We discussed the importance of adding ACE inhibitor therapy. If her ejection fraction remains low at follow-up echo in 6-8 weeks, we will need to start low-dose ACE inhibitor.  Dispo:  Return in about 6 weeks (around 07/06/2016) for Routine Follow Up, w/ Dr. Excell Seltzer.   Medication Adjustments/Labs and Tests Ordered: Current medicines are reviewed at length with the patient today.  Concerns regarding medicines are outlined above.  Medication changes, Labs and Tests ordered today are outlined in the Patient Instructions noted below. Patient Instructions  Medication Instructions:  Your physician has recommended you make the following change in your medication: Decrease amiodarone to 200 mg once a day. Change metoprolol to 37.5 mg twice a day. (this is one and a half tablets twice a day)  Labwork: None ordered  Testing/Procedures: Your physician has requested that you have an echocardiogram.  Echocardiography is a painless test that uses sound waves to create images of your heart. It provides your doctor with information about the  size and shape of your heart and how well your heart's chambers and valves are working. This procedure takes approximately one hour. There are no restrictions for this procedure. To be done in 6 weeks.  Follow-Up: Your physician recommends that you schedule a follow-up appointment in: Please follow up with Dr. Excell Seltzer 6-8 weeks after the echocardiogram.   Any Other Special Instructions Will Be Listed Below (If Applicable).  If you need a refill on your cardiac medications before your next appointment, please call your pharmacy.  Signed, Tereso Newcomer, PA-C  05/25/2016 5:03 PM    [facility] [address] [place], [place], [place]  [address] Phone: [phone]; Fax: [phone]

## 2016-05-26 ENCOUNTER — Telehealth: Payer: Self-pay | Admitting: Physician Assistant

## 2016-05-26 NOTE — Telephone Encounter (Signed)
Please tell Wanda [name] that I reviewed with Dr. Tyrone Sage. She may start cardiac rehabilitation now. I have sent a message to cardiac rehabilitation. If she does not get a call in the next 2 weeks, call us so we can check on it for her. Thanks, Tereso Newcomer, PA-C   05/26/2016 2:31 PM

## 2016-05-27 NOTE — Telephone Encounter (Signed)
I called pt and went over recommendation about Cardiac Rehab. Advised pt if she has not heard from Cardiac Rehab in 2 weeks please call our office to let us know so that we may check on it for her. Pt thanked me for my call.

## 2016-06-01 ENCOUNTER — Ambulatory Visit (INDEPENDENT_AMBULATORY_CARE_PROVIDER_SITE_OTHER): Payer: BC Managed Care – PPO | Admitting: *Deleted

## 2016-06-01 DIAGNOSIS — Z5181 Encounter for therapeutic drug level monitoring: Secondary | ICD-10-CM | POA: Diagnosis not present

## 2016-06-01 DIAGNOSIS — I4891 Unspecified atrial fibrillation: Secondary | ICD-10-CM

## 2016-06-01 DIAGNOSIS — I4819 Other persistent atrial fibrillation: Secondary | ICD-10-CM

## 2016-06-01 DIAGNOSIS — I481 Persistent atrial fibrillation: Secondary | ICD-10-CM | POA: Diagnosis not present

## 2016-06-01 DIAGNOSIS — Z952 Presence of prosthetic heart valve: Secondary | ICD-10-CM

## 2016-06-01 LAB — POCT INR: INR: 2.1

## 2016-06-07 ENCOUNTER — Other Ambulatory Visit: Payer: Self-pay | Admitting: Cardiothoracic Surgery

## 2016-06-07 DIAGNOSIS — Z952 Presence of prosthetic heart valve: Secondary | ICD-10-CM

## 2016-06-16 ENCOUNTER — Ambulatory Visit (INDEPENDENT_AMBULATORY_CARE_PROVIDER_SITE_OTHER): Payer: Self-pay | Admitting: Cardiothoracic Surgery

## 2016-06-16 ENCOUNTER — Ambulatory Visit
Admission: RE | Admit: 2016-06-16 | Discharge: 2016-06-16 | Disposition: A | Discharge status: Home / Self Care | Payer: BC Managed Care – PPO | Source: Ambulatory Visit | Attending: Cardiothoracic Surgery | Admitting: Cardiothoracic Surgery

## 2016-06-16 ENCOUNTER — Ambulatory Visit (INDEPENDENT_AMBULATORY_CARE_PROVIDER_SITE_OTHER): Payer: BC Managed Care – PPO | Admitting: *Deleted

## 2016-06-16 ENCOUNTER — Encounter: Payer: Self-pay | Admitting: Cardiothoracic Surgery

## 2016-06-16 VITALS — BP 142/94 | HR 65 | Resp 20 | Ht 64.0 in | Wt 142.0 lb

## 2016-06-16 DIAGNOSIS — Z952 Presence of prosthetic heart valve: Secondary | ICD-10-CM | POA: Diagnosis not present

## 2016-06-16 DIAGNOSIS — I4891 Unspecified atrial fibrillation: Secondary | ICD-10-CM

## 2016-06-16 DIAGNOSIS — I481 Persistent atrial fibrillation: Secondary | ICD-10-CM | POA: Diagnosis not present

## 2016-06-16 DIAGNOSIS — I350 Nonrheumatic aortic (valve) stenosis: Secondary | ICD-10-CM

## 2016-06-16 DIAGNOSIS — Z5181 Encounter for therapeutic drug level monitoring: Secondary | ICD-10-CM | POA: Diagnosis not present

## 2016-06-16 DIAGNOSIS — I4819 Other persistent atrial fibrillation: Secondary | ICD-10-CM

## 2016-06-16 LAB — POCT INR: INR: 2

## 2016-06-16 NOTE — Progress Notes (Addendum)
[address]       [place] [address]             [phone]      Wanda [name] [facility] Medical Record #[id number] Date of Birth: [birth year]  Referring: Tonny Bollman, MD Primary Care: Betty Swaziland, MD  Chief Complaint:   POST OP FOLLOW UP 05/03/2016  OPERATIVE REPORT PREOPERATIVE DIAGNOSIS:  Bicuspid aortic valve with severe aortic insufficiency. POSTOPERATIVE DIAGNOSIS:  Bicuspid aortic valve with severe aortic insufficiency. SURGICAL PROCEDURE:  Aortic valve replacement with pericardial tissue valve [employer], 21 mm model 3300TFX, serial [id number]. SURGEON:  Sheliah Plane, MD  History of Present Illness:          Past Medical History:  Diagnosis Date  . Anxiety   . Arthritis   . Chronic systolic heart failure (HCC) 05/25/2016  . Depression   . Migraine   . Persistent atrial fibrillation (HCC) 05/13/2016   Post AVR // Amiodarone // Coumadin started; possible duration 6-8 weeks  . PVC's (premature ventricular contractions) 05/25/2016   Holter 11/17 The basic rhythm is sinus. There are frequent PVCs, periods of ventricular bigeminy, and occasional ventricular couplets and triplets.  . S/P AVR (aortic valve replacement)    Echo 11/17:  EF 40-45, mild diff HK, Gr 2 DD, possibly bicuspid AV with very mild AS and severe AI, mild MR, mild LAE // R/L [facility] 1/18: LAD irregs prox 20, Normal R heart hemodynamics // Intraop TEE 05/03/16:  EF 35-40, severe AI, mild MR  . S/P AVR (aortic valve replacement)    2/2 aortic insufficiency // Echo 11/17:  EF 40-45, mild diff HK, Gr 2 DD, possibly bicuspid AV with very mild AS and severe AI, mild MR, mild LAE // R/L [facility] 1/18: LAD irregs prox 20, Normal R heart hemodynamics // Intraop TEE 05/03/16:  EF 35-40, severe AI, mild MR     History  Smoking Status  . Former Smoker  Smokeless Tobacco  . Never Used    Comment: in college    History  Alcohol Use  . Yes    Comment: 1-2 glasses of wine      Allergies  Allergen Reactions  . Codeine Nausea And Vomiting  . Vicodin [Hydrocodone-Acetaminophen] Nausea And Vomiting    Current Outpatient Prescriptions  Medication Sig Dispense Refill  . amphetamine-dextroamphetamine (ADDERALL) 20 MG tablet Take 20 mg by mouth daily. Reported on 08/18/2015    . aspirin EC 81 MG EC tablet Take 1 tablet (81 mg total) by mouth daily.    Marland Kitchen estradiol (MINIVELLE) 0.05 MG/24HR patch Place 1 patch (0.05 mg total) onto the skin 2 (two) times a week. 8 patch 13  . LORazepam (ATIVAN) 0.5 MG tablet Take 0.5 mg by mouth daily as needed for anxiety.     . metoprolol tartrate (LOPRESSOR) 25 MG tablet Take 1.5 tablets (37.5 mg total) by mouth 2 (two) times daily. (Patient taking differently: Take 25 mg by mouth 2 (two) times daily. ) 180 tablet 3  . progesterone (PROMETRIUM) 100 MG capsule Take 100 mg by mouth at bedtime.    . rizatriptan (MAXALT-MLT) 10 MG disintegrating tablet Take 10 mg by mouth once as needed for migraine. May repeat in 2 hours if needed    . traMADol (ULTRAM) 50 MG tablet Take 1-2 tablets (50-100 mg total) by mouth every 4 (four) hours as needed for moderate pain. 30 tablet 0  . warfarin (COUMADIN) 5 MG tablet Take 1  tablet (5 mg total) by mouth daily at 6 PM. 30 tablet 3   No current facility-administered medications for this visit.        Physical Exam: BP (!) 142/94 (BP Location: Right Arm, Cuff Size: Small)   Pulse 65   Resp 20   Ht 5\' 4"  (1.626 m)   Wt 142 lb (64.4 kg)   LMP 08/12/2004   SpO2 99% Comment: RA  BMI 24.37 kg/m   General appearance: alert, cooperative and no distress Neurologic: intact Heart: regular rate and rhythm, S1, S2 normal, no murmur, click, rub or gallop Lungs: clear to auscultation bilaterally Abdomen: soft, non-tender; bowel sounds normal; no masses,  no organomegaly Extremities: extremities normal, atraumatic, no cyanosis or edema and Homans sign is negative, no sign of DVT Wound: sternum stable  and well healed   Diagnostic Studies & Laboratory data:     Recent Radiology Findings:   Dg Chest 2 View  Result Date: 06/16/2016 CLINICAL DATA:  Aortic valve replacement. EXAM: CHEST  2 VIEW COMPARISON:  05/08/2016. FINDINGS: Prior cardiac valve replacement. Heart size stable. No focal infiltrate. Interim clearing of bibasilar atelectasis/infiltrates. Interim clearing of bilateral pleural effusions. No pneumothorax. No acute bony abnormality . IMPRESSION: Prior cardiac valve replacement. Heart size stable. No acute pulmonary disease. Interim clearing of previously identified bibasilar atelectasis/infiltrate and pleural effusions. Electronically Signed   By: Maisie Fus  Register   On: 06/16/2016 14:39      Recent Lab Findings: Lab Results  Component Value Date   WBC 6.3 05/08/2016   HGB 10.3 (L) 05/08/2016   HCT 32.7 (L) 05/08/2016   PLT 200 05/08/2016   GLUCOSE 97 05/10/2016   CHOL 247 (H) 08/18/2015   TRIG 73 08/18/2015   HDL 108 08/18/2015   LDLCALC 124 08/18/2015   ALT 12 (L) 04/29/2016   AST 17 04/29/2016   NA 137 05/10/2016   K 4.0 05/10/2016   CL 96 (L) 05/10/2016   CREATININE 1.03 (H) 05/10/2016   BUN 12 05/10/2016   CO2 34 (H) 05/10/2016   TSH 1.39 08/18/2015   INR 2.0 06/16/2016   HGBA1C 5.4 04/29/2016      Assessment / Plan:    Patient doing well following aortic valve replacement with pericardial tissue valve, for aortic insufficiency and of bicuspid aortic valve. Preop CT of the chest confirmed the patient did not have a dilated descending aorta. She is making good progress postoperatively, with exception of nausea. She notes this is cardiac improved since stopping the amiodarone several days ago  She will start in cardiac rehabilitation in 2 weeks, there has been some delay in getting her in but she is more than ready to start.  I reviewed with her the need for prophylaxis for endocarditis with her artificial heart valve Plan to see her back in 2 months Have  allowed her to start driving  [name] MD      [address] [place] [place] [address] Office [phone]   Beeper [phone]  06/16/2016 2:57 PM

## 2016-06-16 NOTE — Patient Instructions (Signed)

## 2016-06-17 ENCOUNTER — Telehealth: Payer: Self-pay | Admitting: *Deleted

## 2016-06-17 NOTE — Telephone Encounter (Signed)
-----   Message from Tonny Bollman, MD sent at 06/17/2016  9:20 AM EDT ----- She called with side effects - I advised her to stop amiodarone and decrease metoprolol to 25 mg BID. thx ----- Message ----- From: Jeannine Kitten, RN Sent: 06/16/2016   2:26 PM To: Iona Coach, RN, Tonny Bollman, MD  Dr Hartley Barefoot pt in coumadin clinic today and she states that the Amiodarone was discontinued on March 31st . Still on med list just wanted to be sure before taking off her list of medications Thanks Lelon Perla RN

## 2016-06-17 NOTE — Telephone Encounter (Signed)
Spoke with pt's husband and informed that Amiodarone has been removed from her med list and he states she is taking metoprolol 25mg  2 times a day

## 2016-06-28 ENCOUNTER — Ambulatory Visit (INDEPENDENT_AMBULATORY_CARE_PROVIDER_SITE_OTHER): Payer: BC Managed Care – PPO | Admitting: Pharmacist

## 2016-06-28 DIAGNOSIS — Z952 Presence of prosthetic heart valve: Secondary | ICD-10-CM

## 2016-06-28 DIAGNOSIS — I4819 Other persistent atrial fibrillation: Secondary | ICD-10-CM

## 2016-06-28 DIAGNOSIS — I481 Persistent atrial fibrillation: Secondary | ICD-10-CM | POA: Diagnosis not present

## 2016-06-28 DIAGNOSIS — Z5181 Encounter for therapeutic drug level monitoring: Secondary | ICD-10-CM | POA: Diagnosis not present

## 2016-06-28 DIAGNOSIS — I4891 Unspecified atrial fibrillation: Secondary | ICD-10-CM

## 2016-06-28 LAB — POCT INR: INR: 1.4

## 2016-07-04 ENCOUNTER — Other Ambulatory Visit (HOSPITAL_COMMUNITY): Payer: BC Managed Care – PPO

## 2016-07-04 ENCOUNTER — Telehealth (HOSPITAL_COMMUNITY): Payer: Self-pay | Admitting: Family Medicine

## 2016-07-04 NOTE — Telephone Encounter (Signed)
*  Updated* [employer] - no co-payment, deductible $1250/$1250 has been met, out of pocket $4350/$4350 has been met, no co-insurance, no pre-authorization and no limit on visit. Passport/reference [phone].

## 2016-07-05 ENCOUNTER — Encounter (HOSPITAL_COMMUNITY)
Admission: RE | Admit: 2016-07-05 | Discharge: 2016-07-05 | Disposition: A | Discharge status: Home / Self Care | Payer: BC Managed Care – PPO | Source: Ambulatory Visit | Attending: Cardiovascular Disease | Admitting: Cardiovascular Disease

## 2016-07-05 VITALS — BP 110/76 | HR 68 | Ht 65.0 in | Wt 145.3 lb

## 2016-07-05 DIAGNOSIS — Z7982 Long term (current) use of aspirin: Secondary | ICD-10-CM | POA: Diagnosis not present

## 2016-07-05 DIAGNOSIS — Z952 Presence of prosthetic heart valve: Secondary | ICD-10-CM

## 2016-07-05 DIAGNOSIS — I481 Persistent atrial fibrillation: Secondary | ICD-10-CM | POA: Insufficient documentation

## 2016-07-05 DIAGNOSIS — Z7901 Long term (current) use of anticoagulants: Secondary | ICD-10-CM | POA: Diagnosis not present

## 2016-07-05 DIAGNOSIS — Z79899 Other long term (current) drug therapy: Secondary | ICD-10-CM | POA: Insufficient documentation

## 2016-07-05 DIAGNOSIS — M1990 Unspecified osteoarthritis, unspecified site: Secondary | ICD-10-CM | POA: Diagnosis not present

## 2016-07-05 DIAGNOSIS — I5022 Chronic systolic (congestive) heart failure: Secondary | ICD-10-CM | POA: Insufficient documentation

## 2016-07-05 DIAGNOSIS — F419 Anxiety disorder, unspecified: Secondary | ICD-10-CM | POA: Diagnosis not present

## 2016-07-05 DIAGNOSIS — F329 Major depressive disorder, single episode, unspecified: Secondary | ICD-10-CM | POA: Insufficient documentation

## 2016-07-05 NOTE — Progress Notes (Signed)
Cardiac Rehab Medication Review by a Pharmacist  Does the patient  feel that his/her medications are working for him/her?  yes  Has the patient been experiencing any side effects to the medications prescribed?  yes  Does the patient measure his/her own blood pressure or blood glucose at home?  yes   Does the patient have any problems obtaining medications due to transportation or finances?   no  Understanding of regimen: good Understanding of indications: good Potential of compliance: good   Pharmacist comments: Yelissa presents today in good spirits. She had a recent valve repair and episode of Afib for which she is now on coumadin and metoprolol. She isn't describing any side effects outside of some lightheadedness prior to standing. We discussed that this is to be expected and should improve over time. In the meantime, she should make sure she has support prior to standing up. She checks her BP at home (normally around 120s/80s at home). Her warfarin is managed by [facility] and she states her last INR was a little low. She is determined to get off of the metoprolol and warfarin as soon as possible. Overall, she is highly compliant and motivated.   Alfredo Bach, Cleotis Nipper, PharmD Clinical Pharmacy Resident [phone] (Pager) 07/05/2016 10:01 AM

## 2016-07-05 NOTE — Progress Notes (Signed)
Cardiac Individual Treatment Plan  Patient Details  Name: Wanda [name] MRN: [id number] Date of Birth: August 12, 1950 Referring Provider:     CARDIAC REHAB PHASE II ORIENTATION from 07/05/2016 in [facility] CARDI[facility]  Referring Provider  Tonny Bollman, MD.      Initial Encounter Date:    CARDIAC REHAB PHASE II ORIENTATION from 07/05/2016 in [facility] CARDIAC REHAB  Date  07/05/16  Referring Provider  Tonny Bollman, MD.      Visit Diagnosis: 05/03/16 S/P AVR (aortic valve replacement)  Patient's Home Medications on Admission:  Current Outpatient Prescriptions:  .  amphetamine-dextroamphetamine (ADDERALL) 20 MG tablet, Take 20 mg by mouth daily. Reported on 08/18/2015, Disp: , Rfl:  .  aspirin EC 81 MG EC tablet, Take 1 tablet (81 mg total) by mouth daily., Disp: , Rfl:  .  estradiol (MINIVELLE) 0.05 MG/24HR patch, Place 1 patch (0.05 mg total) onto the skin 2 (two) times a week., Disp: 8 patch, Rfl: 13 .  LORazepam (ATIVAN) 0.5 MG tablet, Take 0.5 mg by mouth daily as needed for anxiety. , Disp: , Rfl:  .  metoprolol tartrate (LOPRESSOR) 25 MG tablet, Take 25 mg by mouth 2 (two) times daily., Disp: , Rfl:  .  progesterone (PROMETRIUM) 100 MG capsule, Take 100 mg by mouth at bedtime., Disp: , Rfl:  .  rizatriptan (MAXALT-MLT) 10 MG disintegrating tablet, Take 10 mg by mouth once as needed for migraine. May repeat in 2 hours if needed, Disp: , Rfl:  .  warfarin (COUMADIN) 5 MG tablet, Take 1 tablet (5 mg total) by mouth daily at 6 PM., Disp: 30 tablet, Rfl: 3 .  traMADol (ULTRAM) 50 MG tablet, Take 1-2 tablets (50-100 mg total) by mouth every 4 (four) hours as needed for moderate pain. (Patient not taking: Reported on 07/05/2016), Disp: 30 tablet, Rfl: 0  Past Medical History: Past Medical History:  Diagnosis Date  . Anxiety   . Arthritis   . Chronic systolic heart failure (HCC) 05/25/2016  . Depression   . Migraine   . Persistent atrial  fibrillation (HCC) 05/13/2016   Post AVR // Amiodarone // Coumadin started; possible duration 6-8 weeks  . PVC's (premature ventricular contractions) 05/25/2016   Holter 11/17 The basic rhythm is sinus. There are frequent PVCs, periods of ventricular bigeminy, and occasional ventricular couplets and triplets.  . S/P AVR (aortic valve replacement)    Echo 11/17:  EF 40-45, mild diff HK, Gr 2 DD, possibly bicuspid AV with very mild AS and severe AI, mild MR, mild LAE // R/L [facility] 1/18: LAD irregs prox 20, Normal R heart hemodynamics // Intraop TEE 05/03/16:  EF 35-40, severe AI, mild MR  . S/P AVR (aortic valve replacement)    2/2 aortic insufficiency // Echo 11/17:  EF 40-45, mild diff HK, Gr 2 DD, possibly bicuspid AV with very mild AS and severe AI, mild MR, mild LAE // R/L [facility] 1/18: LAD irregs prox 20, Normal R heart hemodynamics // Intraop TEE 05/03/16:  EF 35-40, severe AI, mild MR    Tobacco Use: History  Smoking Status  . Former Smoker  Smokeless Tobacco  . Never Used    Comment: in college    Labs: Recent Hydrographic surveyor    Labs for ITP Cardiac and Pulmonary Rehab Latest Ref Rng & Units 05/03/2016 05/04/2016 05/05/2016 05/06/2016 05/07/2016   Cholestrol 125 - 200 mg/dL - - - - -   LDLCALC <409 mg/dL - - - - -  HDL >=46 mg/dL - - - - -   Trlycerides <150 mg/dL - - - - -   Hemoglobin A1c 4.8 - 5.6 % - - - - -   PHART 7.350 - 7.450 - - - - -   PCO2ART 32.0 - 48.0 mmHg - - - - -   HCO3 20.0 - 28.0 mmol/L - - - - -   TCO2 0 - 100 mmol/L 21 23 - - -   ACIDBASEDEF 0.0 - 2.0 mmol/L - - - - -   O2SAT % - 80.1 80.7 84.1 67.0      Capillary Blood Glucose: Lab Results  Component Value Date   GLUCAP 91 05/06/2016   GLUCAP 90 05/06/2016   GLUCAP 62 (L) 05/06/2016   GLUCAP 128 (H) 05/06/2016   GLUCAP 98 05/06/2016     Exercise Target Goals: Date: 07/05/16  Exercise Program Goal: Individual exercise prescription set with THRR, safety & activity barriers. Participant demonstrates  ability to understand and report RPE using BORG scale, to self-measure pulse accurately, and to acknowledge the importance of the exercise prescription.  Exercise Prescription Goal: Starting with aerobic activity 30 plus minutes a day, 3 days per week for initial exercise prescription. Provide home exercise prescription and guidelines that participant acknowledges understanding prior to discharge.  Activity Barriers & Risk Stratification:     Activity Barriers & Cardiac Risk Stratification - 07/05/16 0905      Activity Barriers & Cardiac Risk Stratification   Activity Barriers Deconditioning;Muscular Weakness;Shortness of Breath   Cardiac Risk Stratification High      6 Minute Walk:     6 Minute Walk    Row Name 07/05/16 1437         6 Minute Walk   Phase Initial     Distance 1682 feet     Walk Time 6 minutes     # of Rest Breaks 0     MPH 3.19     METS 3.91     RPE 9     VO2 Peak 13.68     Symptoms No     Resting HR 68 bpm     Resting BP 110/76     Max Ex. HR 93 bpm     Max Ex. BP 122/70     2 Minute Post BP 114/72        Oxygen Initial Assessment:   Oxygen Re-Evaluation:   Oxygen Discharge (Final Oxygen Re-Evaluation):   Initial Exercise Prescription:     Initial Exercise Prescription - 07/05/16 1400      Date of Initial Exercise RX and Referring Provider   Date 07/05/16   Referring Provider Tonny Bollman, MD.     Treadmill   MPH 2.3   Grade 1   Minutes 10   METs 3.08     Bike   Level 1   Minutes 10   METs 3.9     NuStep   Level 3   SPM 85   Minutes 10   METs 2.8     Prescription Details   Frequency (times per week) 3   Duration Progress to 30 minutes of continuous aerobic without signs/symptoms of physical distress     Intensity   THRR 40-80% of Max Heartrate 62-125   Ratings of Perceived Exertion 11-13   Perceived Dyspnea 0-4     Progression   Progression Continue to progress workloads to maintain intensity without  signs/symptoms of physical distress.     Resistance Training  Training Prescription Yes   Weight 2lbs.   Reps 10-15      Perform Capillary Blood Glucose checks as needed.  Exercise Prescription Changes:   Exercise Comments:   Exercise Goals and Review:     Exercise Goals    Row Name 07/05/16 0908             Exercise Goals   Increase Physical Activity Yes       Intervention Provide advice, education, support and counseling about physical activity/exercise needs.;Develop an individualized exercise prescription for aerobic and resistive training based on initial evaluation findings, risk stratification, comorbidities and participant's personal goals.       Expected Outcomes Achievement of increased cardiorespiratory fitness and enhanced flexibility, muscular endurance and strength shown through measurements of functional capacity and personal statement of participant.       Increase Strength and Stamina Yes       Intervention Provide advice, education, support and counseling about physical activity/exercise needs.;Develop an individualized exercise prescription for aerobic and resistive training based on initial evaluation findings, risk stratification, comorbidities and participant's personal goals.       Expected Outcomes Achievement of increased cardiorespiratory fitness and enhanced flexibility, muscular endurance and strength shown through measurements of functional capacity and personal statement of participant.          Exercise Goals Re-Evaluation :    Discharge Exercise Prescription (Final Exercise Prescription Changes):   Nutrition:  Target Goals: Understanding of nutrition guidelines, daily intake of sodium 1500mg , cholesterol 200mg , calories 30% from fat and 7% or less from saturated fats, daily to have 5 or more servings of fruits and vegetables.  Biometrics:     Pre Biometrics - 07/05/16 1439      Pre Biometrics   Height 5\' 5"  (1.651 m)   Weight  145 lb 4.5 oz (65.9 kg)   Waist Circumference 29 inches   Hip Circumference 39 inches   Waist to Hip Ratio 0.74 %   BMI (Calculated) 24.2   Triceps Skinfold 33 mm   % Body Fat 35.5 %   Grip Strength 32 kg   Flexibility 15 in   Single Leg Stand 30 seconds       Nutrition Therapy Plan and Nutrition Goals:   Nutrition Discharge: Nutrition Scores:   Nutrition Goals Re-Evaluation:   Nutrition Goals Re-Evaluation:   Nutrition Goals Discharge (Final Nutrition Goals Re-Evaluation):   Psychosocial: Target Goals: Acknowledge presence or absence of significant depression and/or stress, maximize coping skills, provide positive support system. Participant is able to verbalize types and ability to use techniques and skills needed for reducing stress and depression.  Initial Review & Psychosocial Screening:     Initial Psych Review & Screening - 07/05/16 1613      Initial Review   Current issues with None Identified     Family Dynamics   Good Support System? Yes   Comments upon initial assessment no psychosocial needs identified, no intervetions necessary      Barriers   Psychosocial barriers to participate in program There are no identifiable barriers or psychosocial needs.     Screening Interventions   Interventions Encouraged to exercise;Provide feedback about the scores to participant      Quality of Life Scores:   PHQ-9: Recent Review Flowsheet Data    There is no flowsheet data to display.     Interpretation of Total Score  Total Score Depression Severity:  1-4 = Minimal depression, 5-9 = Mild depression, 10-14 = Moderate depression, 15-19 =  Moderately severe depression, 20-27 = Severe depression   Psychosocial Evaluation and Intervention:   Psychosocial Re-Evaluation:   Psychosocial Discharge (Final Psychosocial Re-Evaluation):   Vocational Rehabilitation: Provide vocational rehab assistance to qualifying candidates.   Vocational Rehab Evaluation &  Intervention:     Vocational Rehab - 07/05/16 1609      Initial Vocational Rehab Evaluation & Intervention   Assessment shows need for Vocational Rehabilitation --  pt did not return homework packet       Education: Education Goals: Education classes will be provided on a weekly basis, covering required topics. Participant will state understanding/return demonstration of topics presented.  Learning Barriers/Preferences:     Learning Barriers/Preferences - 07/05/16 0907      Learning Barriers/Preferences   Learning Preferences Pictoral;Video;Written Material;Computer/Internet      Education Topics: Count Your Pulse:  -Group instruction provided by verbal instruction, demonstration, patient participation and written materials to support subject.  Instructors address importance of being able to find your pulse and how to count your pulse when at home without a heart monitor.  Patients get hands on experience counting their pulse with staff help and individually.   Heart Attack, Angina, and Risk Factor Modification:  -Group instruction provided by verbal instruction, video, and written materials to support subject.  Instructors address signs and symptoms of angina and heart attacks.    Also discuss risk factors for heart disease and how to make changes to improve heart health risk factors.   Functional Fitness:  -Group instruction provided by verbal instruction, demonstration, patient participation, and written materials to support subject.  Instructors address safety measures for doing things around the house.  Discuss how to get up and down off the floor, how to pick things up properly, how to safely get out of a chair without assistance, and balance training.   Meditation and Mindfulness:  -Group instruction provided by verbal instruction, patient participation, and written materials to support subject.  Instructor addresses importance of mindfulness and meditation practice to  help reduce stress and improve awareness.  Instructor also leads participants through a meditation exercise.    Stretching for Flexibility and Mobility:  -Group instruction provided by verbal instruction, patient participation, and written materials to support subject.  Instructors lead participants through series of stretches that are designed to increase flexibility thus improving mobility.  These stretches are additional exercise for major muscle groups that are typically performed during regular warm up and cool down.   Hands Only CPR Anytime:  -Group instruction provided by verbal instruction, video, patient participation and written materials to support subject.  Instructors co-teach with AHA video for hands only CPR.  Participants get hands on experience with mannequins.   Nutrition I class: Heart Healthy Eating:  -Group instruction provided by PowerPoint slides, verbal discussion, and written materials to support subject matter. The instructor gives an explanation and review of the Therapeutic Lifestyle Changes diet recommendations, which includes a discussion on lipid goals, dietary fat, sodium, fiber, plant stanol/sterol esters, sugar, and the components of a well-balanced, healthy diet.   Nutrition II class: Lifestyle Skills:  -Group instruction provided by PowerPoint slides, verbal discussion, and written materials to support subject matter. The instructor gives an explanation and review of label reading, grocery shopping for heart health, heart healthy recipe modifications, and ways to make healthier choices when eating out.   Diabetes Question & Answer:  -Group instruction provided by PowerPoint slides, verbal discussion, and written materials to support subject matter. The instructor gives an explanation and  review of diabetes co-morbidities, pre- and post-prandial blood glucose goals, pre-exercise blood glucose goals, signs, symptoms, and treatment of hypoglycemia and  hyperglycemia, and foot care basics.   Diabetes Blitz:  -Group instruction provided by PowerPoint slides, verbal discussion, and written materials to support subject matter. The instructor gives an explanation and review of the physiology behind type 1 and type 2 diabetes, diabetes medications and rational behind using different medications, pre- and post-prandial blood glucose recommendations and Hemoglobin A1c goals, diabetes diet, and exercise including blood glucose guidelines for exercising safely.    Portion Distortion:  -Group instruction provided by PowerPoint slides, verbal discussion, written materials, and food models to support subject matter. The instructor gives an explanation of serving size versus portion size, changes in portions sizes over the last 20 years, and what consists of a serving from each food group.   Stress Management:  -Group instruction provided by verbal instruction, video, and written materials to support subject matter.  Instructors review role of stress in heart disease and how to cope with stress positively.     Exercising on Your Own:  -Group instruction provided by verbal instruction, power point, and written materials to support subject.  Instructors discuss benefits of exercise, components of exercise, frequency and intensity of exercise, and end points for exercise.  Also discuss use of nitroglycerin and activating EMS.  Review options of places to exercise outside of rehab.  Review guidelines for sex with heart disease.   Cardiac Drugs I:  -Group instruction provided by verbal instruction and written materials to support subject.  Instructor reviews cardiac drug classes: antiplatelets, anticoagulants, beta blockers, and statins.  Instructor discusses reasons, side effects, and lifestyle considerations for each drug class.   Cardiac Drugs II:  -Group instruction provided by verbal instruction and written materials to support subject.  Instructor  reviews cardiac drug classes: angiotensin converting enzyme inhibitors (ACE-I), angiotensin II receptor blockers (ARBs), nitrates, and calcium channel blockers.  Instructor discusses reasons, side effects, and lifestyle considerations for each drug class.   Anatomy and Physiology of the Circulatory System:  -Group instruction provided by verbal instruction, video, and written materials to support subject.  Reviews functional anatomy of heart, how it relates to various diagnoses, and what role the heart plays in the overall system.   Knowledge Questionnaire Score:   Core Components/Risk Factors/Patient Goals at Admission:     Personal Goals and Risk Factors at Admission - 07/05/16 0910      Core Components/Risk Factors/Patient Goals on Admission   Personal Goal Other Yes   Personal Goal Have a better understanding of the healing process with cardiac surgery (AVR) and medication management   Intervention Provide cardiac education on disease process, medication management and risk factor modifications.    Expected Outcomes Pt will have increased knowledge and awareness on cardiovascular disease and medication management      Core Components/Risk Factors/Patient Goals Review:    Core Components/Risk Factors/Patient Goals at Discharge (Final Review):    ITP Comments:     ITP Comments    Row Name 07/05/16 0902           ITP Comments Medical Director, Dr. Armanda Magic          Comments: Patient attended orientation from [phone] to 1020  to review rules and guidelines for program. Completed 6 minute walk test, Intitial ITP, and exercise prescription.  VSS. Telemetry-sinus rhythm, non specific STT wave changes .  Asymptomatic.

## 2016-07-07 ENCOUNTER — Ambulatory Visit (INDEPENDENT_AMBULATORY_CARE_PROVIDER_SITE_OTHER): Payer: BC Managed Care – PPO | Admitting: Pharmacist

## 2016-07-07 DIAGNOSIS — I481 Persistent atrial fibrillation: Secondary | ICD-10-CM

## 2016-07-07 DIAGNOSIS — Z5181 Encounter for therapeutic drug level monitoring: Secondary | ICD-10-CM

## 2016-07-07 DIAGNOSIS — Z952 Presence of prosthetic heart valve: Secondary | ICD-10-CM | POA: Diagnosis not present

## 2016-07-07 DIAGNOSIS — I4891 Unspecified atrial fibrillation: Secondary | ICD-10-CM | POA: Diagnosis not present

## 2016-07-07 DIAGNOSIS — I4819 Other persistent atrial fibrillation: Secondary | ICD-10-CM

## 2016-07-07 LAB — POCT INR: INR: 1.8

## 2016-07-11 ENCOUNTER — Encounter (HOSPITAL_COMMUNITY)
Admission: RE | Admit: 2016-07-11 | Discharge: 2016-07-11 | Disposition: A | Discharge status: Home / Self Care | Payer: BC Managed Care – PPO | Source: Ambulatory Visit | Attending: Cardiovascular Disease | Admitting: Cardiovascular Disease

## 2016-07-11 ENCOUNTER — Encounter (HOSPITAL_COMMUNITY): Payer: Self-pay

## 2016-07-11 DIAGNOSIS — Z952 Presence of prosthetic heart valve: Secondary | ICD-10-CM

## 2016-07-11 NOTE — Progress Notes (Signed)
Daily Session Note  Patient Details  Name: Wanda [name] MRN: [id number] Date of Birth: 12-09-1950 Referring Provider:     CARDIAC REHAB PHASE II ORIENTATION from 07/05/2016 in [place]  Referring Provider  Sherren Mocha, MD.      Encounter Date: 07/11/2016  Check In:     Session Check In - 07/11/16 1424      Check-In   Location [facility]   Staff Present Andi Hence, RN, BSN;Amber Fair, MS, ACSM RCEP, Exercise Physiologist;Molly diVincenzo, MS, ACSM RCEP, Exercise Physiologist;Other   Supervising physician immediately available to respond to emergencies [facility] immediately available   Physician(s) Dr. Posey Pronto    Medication changes reported     No   Fall or balance concerns reported    No   Tobacco Cessation No Change   Warm-up and Cool-down Performed as group-led instruction   Resistance Training Performed Yes   VAD Patient? No     Pain Assessment   Currently in Pain? No/denies      Capillary Blood Glucose: No results found for this or any previous visit (from the past 24 hour(s)).    History  Smoking Status  . Former Smoker  Smokeless Tobacco  . Never Used    Comment: in college    Goals Met:  Exercise tolerated well  Goals Unmet:  Not Applicable  Comments: Pt started cardiac rehab today.  Pt tolerated light exercise without difficulty. VSS, telemetry-sinus rhythm,  asymptomatic.  Medication list reconciled. Pt denies barriers to medicaiton compliance.  PSYCHOSOCIAL ASSESSMENT:  PHQ-1 from health related anxiety.  Pt is eager to regain energy and confidence with activities.  Pt exhibits positive coping skills, hopeful outlook with supportive family. Otherwise No psychosocial needs identified at this time, no psychosocial interventions necessary.    Pt enjoys reading, yard work and spending time with her grandchildren.  Pt is retired Pharmacist, hospital.  Pt goals for cardiac rehab are to increase strength/stamina,  decrease dyspnea and lose weight. Pt encouraged to participate in exercise, lifestyle modification and nutrition education opportunities.    Pt oriented to exercise equipment and routine.    Understanding verbalized.   Dr. Fransico Him is Medical Director for Cardiac Rehab at [facility].

## 2016-07-13 ENCOUNTER — Encounter (HOSPITAL_COMMUNITY)
Admission: RE | Admit: 2016-07-13 | Discharge: 2016-07-13 | Disposition: A | Discharge status: Home / Self Care | Payer: BC Managed Care – PPO | Source: Ambulatory Visit | Attending: Cardiovascular Disease | Admitting: Cardiovascular Disease

## 2016-07-13 DIAGNOSIS — M1990 Unspecified osteoarthritis, unspecified site: Secondary | ICD-10-CM | POA: Insufficient documentation

## 2016-07-13 DIAGNOSIS — Z7901 Long term (current) use of anticoagulants: Secondary | ICD-10-CM | POA: Diagnosis not present

## 2016-07-13 DIAGNOSIS — Z79899 Other long term (current) drug therapy: Secondary | ICD-10-CM | POA: Diagnosis not present

## 2016-07-13 DIAGNOSIS — Z7982 Long term (current) use of aspirin: Secondary | ICD-10-CM | POA: Insufficient documentation

## 2016-07-13 DIAGNOSIS — I5022 Chronic systolic (congestive) heart failure: Secondary | ICD-10-CM | POA: Insufficient documentation

## 2016-07-13 DIAGNOSIS — F329 Major depressive disorder, single episode, unspecified: Secondary | ICD-10-CM | POA: Diagnosis not present

## 2016-07-13 DIAGNOSIS — I481 Persistent atrial fibrillation: Secondary | ICD-10-CM | POA: Insufficient documentation

## 2016-07-13 DIAGNOSIS — Z952 Presence of prosthetic heart valve: Secondary | ICD-10-CM | POA: Diagnosis present

## 2016-07-13 DIAGNOSIS — F419 Anxiety disorder, unspecified: Secondary | ICD-10-CM | POA: Diagnosis not present

## 2016-07-15 ENCOUNTER — Encounter: Payer: Self-pay | Admitting: Cardiovascular Disease

## 2016-07-15 ENCOUNTER — Ambulatory Visit (INDEPENDENT_AMBULATORY_CARE_PROVIDER_SITE_OTHER): Payer: BC Managed Care – PPO | Admitting: Cardiovascular Disease

## 2016-07-15 ENCOUNTER — Ambulatory Visit: Payer: Self-pay | Admitting: Cardiovascular Disease

## 2016-07-15 ENCOUNTER — Encounter (HOSPITAL_COMMUNITY)
Admission: RE | Admit: 2016-07-15 | Discharge: 2016-07-15 | Disposition: A | Discharge status: Home / Self Care | Payer: BC Managed Care – PPO | Source: Ambulatory Visit | Attending: Cardiovascular Disease | Admitting: Cardiovascular Disease

## 2016-07-15 VITALS — BP 122/78 | HR 64 | Ht 64.0 in | Wt 146.2 lb

## 2016-07-15 DIAGNOSIS — Z952 Presence of prosthetic heart valve: Secondary | ICD-10-CM

## 2016-07-15 DIAGNOSIS — I4819 Other persistent atrial fibrillation: Secondary | ICD-10-CM

## 2016-07-15 DIAGNOSIS — Z5181 Encounter for therapeutic drug level monitoring: Secondary | ICD-10-CM

## 2016-07-15 DIAGNOSIS — I359 Nonrheumatic aortic valve disorder, unspecified: Secondary | ICD-10-CM

## 2016-07-15 MED ORDER — ASPIRIN EC 325 MG PO TBEC: 325.0000 mg | DELAYED_RELEASE_TABLET | Freq: Every day | ORAL | 0 refills | Status: DC

## 2016-07-15 MED ORDER — METOPROLOL SUCCINATE ER 25 MG PO TB24: 25.0000 mg | ORAL_TABLET | Freq: Every day | ORAL | 3 refills | Status: DC

## 2016-07-15 NOTE — Progress Notes (Signed)
Cardiology Office Note Date:  07/15/2016   ID:  HAJAR PENNINGER, DOB February 14, 1951, MRN [id number]  PCP:  Betty Swaziland, MD  Cardiologist:  Tonny Bollman, MD    Chief Complaint  Patient presents with  . Shortness of Breath     History of Present Illness: Wanda [name] is a 66 y.o. 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 She underwent bioprosthetic aortic valve replacement 05/03/2016. Her postoperative course was complicated by atrial fibrillation with RVR requiring amiodarone.  The patient is here with her husband today. She's doing fairly well. She has some soreness in her chest still but feels this is related to healing from her sternotomy. She has started phase II cardiac rehabilitation. She complains of shortness of breath with activity, but denies edema, orthopnea, or PND. She has no exertional chest pain or pressure. No cough. No heart palpitations.   Past Medical History:  Diagnosis Date  . Anxiety   . Arthritis   . Chronic systolic heart failure (HCC) 05/25/2016  . Depression   . Migraine   . Persistent atrial fibrillation (HCC) 05/13/2016   Post AVR // Amiodarone // Coumadin started; possible duration 6-8 weeks  . PVC's (premature ventricular contractions) 05/25/2016   Holter 11/17 The basic rhythm is sinus. There are frequent PVCs, periods of ventricular bigeminy, and occasional ventricular couplets and triplets.  . S/P AVR (aortic valve replacement)    Echo 11/17:  EF 40-45, mild diff HK, Gr 2 DD, possibly bicuspid AV with very mild AS and severe AI, mild MR, mild LAE // R/L [facility] 1/18: LAD irregs prox 20, Normal R heart hemodynamics // Intraop TEE 05/03/16:  EF 35-40, severe AI, mild MR  . S/P AVR (aortic valve replacement)    2/2 aortic insufficiency // Echo 11/17:  EF 40-45, mild diff HK, Gr 2 DD, possibly bicuspid AV with very mild AS and severe AI, mild MR, mild LAE // R/L [facility] 1/18: LAD irregs  prox 20, Normal R heart hemodynamics // Intraop TEE 05/03/16:  EF 35-40, severe AI, mild MR    Past Surgical History:  Procedure Laterality Date  . AORTIC VALVE REPLACEMENT N/A 05/03/2016   Procedure: AORTIC VALVE REPLACEMENT (AVR) WITH MAGNA EASE PERICARDIAL BIOPROSTHESIS - AORTIC 21 MM MODEL 3300TFX;  Surgeon: [name], MD;  Location: [facility] OR;  Service: Open Heart Surgery;  Laterality: N/A;  . TEE WITHOUT CARDIOVERSION N/A 05/03/2016   Procedure: TRANSESOPHAGEAL ECHOCARDIOGRAM (TEE);  Surgeon: [name], MD;  Location: [facility] OR;  Service: Open Heart Surgery;  Laterality: N/A;  . TONSILLECTOMY AND ADENOIDECTOMY      Current Outpatient Prescriptions  Medication Sig Dispense Refill  . amphetamine-dextroamphetamine (ADDERALL) 20 MG tablet Take 20 mg by mouth daily. Reported on 08/18/2015    . aspirin EC 325 MG tablet Take 1 tablet (325 mg total) by mouth daily. 30 tablet 0  . estradiol (MINIVELLE) 0.05 MG/24HR patch Place 1 patch (0.05 mg total) onto the skin 2 (two) times a week. 8 patch 13  . LORazepam (ATIVAN) 0.5 MG tablet Take 0.5 mg by mouth daily as needed for anxiety.     . metoprolol succinate (TOPROL-XL) 25 MG 24 hr tablet Take 1 tablet (25 mg total) by mouth at bedtime. 90 tablet 3  . progesterone (PROMETRIUM) 100 MG capsule Take 100 mg by mouth at bedtime.    . rizatriptan (MAXALT-MLT) 10 MG disintegrating tablet Take 10 mg by mouth once  as needed for migraine. May repeat in 2 hours if needed    . traMADol (ULTRAM) 50 MG tablet Take 1-2 tablets (50-100 mg total) by mouth every 4 (four) hours as needed for moderate pain. (Patient not taking: Reported on 07/05/2016) 30 tablet 0   No current facility-administered medications for this visit.     Allergies:   Codeine and Vicodin [hydrocodone-acetaminophen]   Social History:  The patient  reports that she has quit smoking. She has never used smokeless tobacco. She reports that she drinks alcohol. She reports that she does not  use drugs.   Family History:  The patient's  family history includes Breast cancer (age of onset: 85) in her sister; Congestive Heart Failure in her maternal grandfather; Deep vein thrombosis in her brother, father, paternal aunt, and paternal grandmother; Diabetes in her mother; Heart disease in her brother; Lung cancer in her brother; Ovarian cancer (age of onset: 9) in her maternal grandmother.    ROS:  Please see the history of present illness.  All other systems are reviewed and negative.    PHYSICAL EXAM: VS:  BP 122/78   Pulse 64   Ht 5\' 4"  (1.626 m)   Wt 146 lb 4 oz (66.3 kg)   LMP 08/12/2004   SpO2 98%   BMI 25.10 kg/m  , BMI Body mass index is 25.1 kg/m. GEN: Well nourished, well developed, in no acute distress  HEENT: normal  Neck: no JVD, no masses. No carotid bruits Cardiac: RRR with 2/6 SEM at the RUSB             Respiratory:  clear to auscultation bilaterally, normal work of breathing GI: soft, nontender, nondistended, + BS MS: no deformity or atrophy  Ext: no pretibial edema, pedal pulses 2+= bilaterally Skin: warm and dry, no rash Neuro:  Strength and sensation are intact Psych: euthymic mood, full affect  EKG:  EKG is not ordered today.  Recent Labs: 08/18/2015: TSH 1.39 04/29/2016: ALT 12 05/04/2016: Magnesium 2.5 05/08/2016: Hemoglobin 10.3; Platelets 200 05/10/2016: BUN 12; Creatinine, Ser 1.03; Potassium 4.0; Sodium 137   Lipid Panel     Component Value Date/Time   CHOL 247 (H) 08/18/2015 1600   TRIG 73 08/18/2015 1600   HDL 108 08/18/2015 1600   CHOLHDL 2.3 08/18/2015 1600   VLDL 15 08/18/2015 1600   LDLCALC 124 08/18/2015 1600     Wt Readings from Last 3 Encounters:  07/15/16 146 lb 4 oz (66.3 kg)  07/05/16 145 lb 4.5 oz (65.9 kg)  06/16/16 142 lb (64.4 kg)    ASSESSMENT AND PLAN: 1. Aortic valve disease with severe symptomatic AI now s/p bioprosthetic AVR. The patient reports stable New York Heart Association functional class II symptoms of  exertional dyspnea. Will update her echocardiogram to establish a postsurgical baseline.  2. Postoperative atrial fibrillation: The patient is maintaining sinus rhythm. She has been off of amiodarone for several weeks. Will discontinue warfarin and she is greater than 2 months out from surgery. She monitors her heart rate and blood pressure regularly. She also participating in phase II cardiac rehabilitation so we will be able to keep an eye on her heart rhythm. Will change metoprolol to Toprol-XL 25 mg at bedtime.  3. Nonischemic cardiomyopathy: The patient had dilated LV with LV dysfunction related to her severe aortic insufficiency. Will reassess with an echocardiogram. Depending on her LVEF, may add losartan but for now will discontinue metoprolol succinate as her blood pressure tends to run low and  I'm concerned she will be able to tolerate another antihypertensive medication.  Current medicines are reviewed with the patient today.  The patient does not have concerns regarding medicines.  Labs/ tests ordered today include:   Orders Placed This Encounter  Procedures  . ECHOCARDIOGRAM COMPLETE    Disposition:   FU 6 months  Signed, Tonny Bollman, MD  07/15/2016 5:13 PM    [facility] [address] [place], [place], [place]  [address] Phone: [phone]; Fax: [phone]

## 2016-07-15 NOTE — Patient Instructions (Signed)
Medication Instructions:  Your physician has recommended you make the following change in your medication:  1. INCREASE Aspirin to 325mg  take one tablet by mouth daily 2. STOP Coumadin 3. STOP Metoprolol Tartrate 4. START Metoprolol Succinate 25mg  take one tablet by mouth at bed time  Labwork: No new orders.   Testing/Procedures: Your physician has requested that you have an echocardiogram. Echocardiography is a painless test that uses sound waves to create images of your heart. It provides your doctor with information about the size and shape of your heart and how well your heart's chambers and valves are working. This procedure takes approximately one hour. There are no restrictions for this procedure.  Follow-Up: Your physician wants you to follow-up in: 6 MONTHS with Dr Excell Seltzer.  You will receive a reminder letter in the mail two months in advance. If you don't receive a letter, please call our office to schedule the follow-up appointment.   Any Other Special Instructions Will Be Listed Below (If Applicable).     If you need a refill on your cardiac medications before your next appointment, please call your pharmacy.

## 2016-07-18 ENCOUNTER — Encounter (HOSPITAL_COMMUNITY): Admission: RE | Admit: 2016-07-18 | Payer: BC Managed Care – PPO | Source: Ambulatory Visit

## 2016-07-20 ENCOUNTER — Encounter (HOSPITAL_COMMUNITY)
Admission: RE | Admit: 2016-07-20 | Discharge: 2016-07-20 | Disposition: A | Discharge status: Home / Self Care | Payer: BC Managed Care – PPO | Source: Ambulatory Visit | Attending: Cardiovascular Disease | Admitting: Cardiovascular Disease

## 2016-07-20 ENCOUNTER — Encounter (HOSPITAL_COMMUNITY): Payer: BC Managed Care – PPO

## 2016-07-20 DIAGNOSIS — Z952 Presence of prosthetic heart valve: Secondary | ICD-10-CM

## 2016-07-22 ENCOUNTER — Encounter (HOSPITAL_COMMUNITY): Payer: BC Managed Care – PPO

## 2016-07-25 ENCOUNTER — Encounter (HOSPITAL_COMMUNITY): Payer: BC Managed Care – PPO

## 2016-07-26 ENCOUNTER — Other Ambulatory Visit (HOSPITAL_COMMUNITY): Payer: BC Managed Care – PPO

## 2016-07-27 ENCOUNTER — Encounter (HOSPITAL_COMMUNITY)
Admission: RE | Admit: 2016-07-27 | Discharge: 2016-07-27 | Disposition: A | Discharge status: Home / Self Care | Payer: BC Managed Care – PPO | Source: Ambulatory Visit | Attending: Cardiovascular Disease | Admitting: Cardiovascular Disease

## 2016-07-27 DIAGNOSIS — Z952 Presence of prosthetic heart valve: Secondary | ICD-10-CM

## 2016-07-29 ENCOUNTER — Encounter (HOSPITAL_COMMUNITY)
Admission: RE | Admit: 2016-07-29 | Discharge: 2016-07-29 | Disposition: A | Discharge status: Home / Self Care | Payer: BC Managed Care – PPO | Source: Ambulatory Visit | Attending: Cardiovascular Disease | Admitting: Cardiovascular Disease

## 2016-07-29 DIAGNOSIS — Z952 Presence of prosthetic heart valve: Secondary | ICD-10-CM

## 2016-08-01 ENCOUNTER — Encounter (HOSPITAL_COMMUNITY)
Admission: RE | Admit: 2016-08-01 | Discharge: 2016-08-01 | Disposition: A | Discharge status: Home / Self Care | Payer: BC Managed Care – PPO | Source: Ambulatory Visit | Attending: Cardiovascular Disease | Admitting: Cardiovascular Disease

## 2016-08-01 DIAGNOSIS — Z952 Presence of prosthetic heart valve: Secondary | ICD-10-CM | POA: Diagnosis not present

## 2016-08-02 NOTE — Progress Notes (Signed)
Cardiac Individual Treatment Plan  Patient Details  Name: Wanda [name] MRN: [id number] Date of Birth: 12/18/1950 Referring Provider:     CARDIAC REHAB PHASE II ORIENTATION from 07/05/2016 in [facility] CARDIAC [facility]  Referring Provider  Tonny Bollman, MD.      Initial Encounter Date:    CARDIAC REHAB PHASE II ORIENTATION from 07/05/2016 in [facility] CARDIAC REHAB  Date  07/05/16  Referring Provider  Tonny Bollman, MD.      Visit Diagnosis: 05/03/16 S/P AVR (aortic valve replacement)  Patient's Home Medications on Admission:  Current Outpatient Prescriptions:  .  amphetamine-dextroamphetamine (ADDERALL) 20 MG tablet, Take 20 mg by mouth daily. Reported on 08/18/2015, Disp: , Rfl:  .  aspirin EC 325 MG tablet, Take 1 tablet (325 mg total) by mouth daily., Disp: 30 tablet, Rfl: 0 .  estradiol (MINIVELLE) 0.05 MG/24HR patch, Place 1 patch (0.05 mg total) onto the skin 2 (two) times a week., Disp: 8 patch, Rfl: 13 .  LORazepam (ATIVAN) 0.5 MG tablet, Take 0.5 mg by mouth daily as needed for anxiety. , Disp: , Rfl:  .  metoprolol succinate (TOPROL-XL) 25 MG 24 hr tablet, Take 1 tablet (25 mg total) by mouth at bedtime., Disp: 90 tablet, Rfl: 3 .  progesterone (PROMETRIUM) 100 MG capsule, Take 100 mg by mouth at bedtime., Disp: , Rfl:  .  rizatriptan (MAXALT-MLT) 10 MG disintegrating tablet, Take 10 mg by mouth once as needed for migraine. May repeat in 2 hours if needed, Disp: , Rfl:  .  traMADol (ULTRAM) 50 MG tablet, Take 1-2 tablets (50-100 mg total) by mouth every 4 (four) hours as needed for moderate pain. (Patient not taking: Reported on 07/05/2016), Disp: 30 tablet, Rfl: 0  Past Medical History: Past Medical History:  Diagnosis Date  . Anxiety   . Arthritis   . Chronic systolic heart failure (HCC) 05/25/2016  . Depression   . Migraine   . Persistent atrial fibrillation (HCC) 05/13/2016   Post AVR // Amiodarone // Coumadin started; possible  duration 6-8 weeks  . PVC's (premature ventricular contractions) 05/25/2016   Holter 11/17 The basic rhythm is sinus. There are frequent PVCs, periods of ventricular bigeminy, and occasional ventricular couplets and triplets.  . S/P AVR (aortic valve replacement)    Echo 11/17:  EF 40-45, mild diff HK, Gr 2 DD, possibly bicuspid AV with very mild AS and severe AI, mild MR, mild LAE // R/L [facility] 1/18: LAD irregs prox 20, Normal R heart hemodynamics // Intraop TEE 05/03/16:  EF 35-40, severe AI, mild MR  . S/P AVR (aortic valve replacement)    2/2 aortic insufficiency // Echo 11/17:  EF 40-45, mild diff HK, Gr 2 DD, possibly bicuspid AV with very mild AS and severe AI, mild MR, mild LAE // R/L [facility] 1/18: LAD irregs prox 20, Normal R heart hemodynamics // Intraop TEE 05/03/16:  EF 35-40, severe AI, mild MR    Tobacco Use: History  Smoking Status  . Former Smoker  Smokeless Tobacco  . Never Used    Comment: in college    Labs: Recent Hydrographic surveyor    Labs for ITP Cardiac and Pulmonary Rehab Latest Ref Rng & Units 05/03/2016 05/04/2016 05/05/2016 05/06/2016 05/07/2016   Cholestrol 125 - 200 mg/dL - - - - -   LDLCALC <409 mg/dL - - - - -   HDL >=81 mg/dL - - - - -   Trlycerides <150 mg/dL - - - - -  Hemoglobin A1c 4.8 - 5.6 % - - - - -   PHART 7.350 - 7.450 - - - - -   PCO2ART 32.0 - 48.0 mmHg - - - - -   HCO3 20.0 - 28.0 mmol/L - - - - -   TCO2 0 - 100 mmol/L 21 23 - - -   ACIDBASEDEF 0.0 - 2.0 mmol/L - - - - -   O2SAT % - 80.1 80.7 84.1 67.0      Capillary Blood Glucose: Lab Results  Component Value Date   GLUCAP 91 05/06/2016   GLUCAP 90 05/06/2016   GLUCAP 62 (L) 05/06/2016   GLUCAP 128 (H) 05/06/2016   GLUCAP 98 05/06/2016     Exercise Target Goals:    Exercise Program Goal: Individual exercise prescription set with THRR, safety & activity barriers. Participant demonstrates ability to understand and report RPE using BORG scale, to self-measure pulse accurately, and to  acknowledge the importance of the exercise prescription.  Exercise Prescription Goal: Starting with aerobic activity 30 plus minutes a day, 3 days per week for initial exercise prescription. Provide home exercise prescription and guidelines that participant acknowledges understanding prior to discharge.  Activity Barriers & Risk Stratification:     Activity Barriers & Cardiac Risk Stratification - 07/05/16 0905      Activity Barriers & Cardiac Risk Stratification   Activity Barriers Deconditioning;Muscular Weakness;Shortness of Breath   Cardiac Risk Stratification High      6 Minute Walk:     6 Minute Walk    Row Name 07/05/16 1437         6 Minute Walk   Phase Initial     Distance 1682 feet     Walk Time 6 minutes     # of Rest Breaks 0     MPH 3.19     METS 3.91     RPE 9     VO2 Peak 13.68     Symptoms No     Resting HR 68 bpm     Resting BP 110/76     Max Ex. HR 93 bpm     Max Ex. BP 122/70     2 Minute Post BP 114/72        Oxygen Initial Assessment:   Oxygen Re-Evaluation:   Oxygen Discharge (Final Oxygen Re-Evaluation):   Initial Exercise Prescription:     Initial Exercise Prescription - 07/05/16 1400      Date of Initial Exercise RX and Referring Provider   Date 07/05/16   Referring Provider Tonny Bollman, MD.     Treadmill   MPH 2.3   Grade 1   Minutes 10   METs 3.08     Bike   Level 1   Minutes 10   METs 3.9     NuStep   Level 3   SPM 85   Minutes 10   METs 2.8     Prescription Details   Frequency (times per week) 3   Duration Progress to 30 minutes of continuous aerobic without signs/symptoms of physical distress     Intensity   THRR 40-80% of Max Heartrate 62-125   Ratings of Perceived Exertion 11-13   Perceived Dyspnea 0-4     Progression   Progression Continue to progress workloads to maintain intensity without signs/symptoms of physical distress.     Resistance Training   Training Prescription Yes   Weight  2lbs.   Reps 10-15      Perform Capillary Blood Glucose  checks as needed.  Exercise Prescription Changes:     Exercise Prescription Changes    Row Name 07/20/16 1100 08/02/16 1400           Response to Exercise   Blood Pressure (Admit) 104/60 100/68      Blood Pressure (Exercise) 136/82 136/70      Blood Pressure (Exit) 106/74 106/60      Heart Rate (Admit) 88 bpm 79 bpm      Heart Rate (Exercise) 133 bpm 119 bpm      Heart Rate (Exit) 88 bpm 85 bpm      Rating of Perceived Exertion (Exercise) 12 12      Symptoms none none      Comments pt was oriented to exercise equipment on 07/11/16 pt was oriented to exercise equipment on 07/11/16      Duration Continue with 30 min of aerobic exercise without signs/symptoms of physical distress. Continue with 30 min of aerobic exercise without signs/symptoms of physical distress.      Intensity THRR unchanged THRR unchanged        Progression   Progression Continue to progress workloads to maintain intensity without signs/symptoms of physical distress. Continue to progress workloads to maintain intensity without signs/symptoms of physical distress.      Average METs 3.5 3.9        Resistance Training   Training Prescription Yes Yes      Weight 4lbs 4lbs      Reps 10-15 10-15      Time 10 Minutes 10 Minutes        Treadmill   MPH 2.6 3.2      Grade 2 2      Minutes 10 15      METs 3.71 4.33        Bike   Level 1  -      Minutes 10  -      METs 3.9  -        NuStep   Level 3 4      SPM 85 90      Minutes 10 15      METs 2.9 3.5         Exercise Comments:     Exercise Comments    Row Name 08/02/16 1437           Exercise Comments Reviewed METs and goals, Pt is tolerating exercise well; will continue to monitor exercise progression.          Exercise Goals and Review:     Exercise Goals    Row Name 07/05/16 0908             Exercise Goals   Increase Physical Activity Yes       Intervention Provide  advice, education, support and counseling about physical activity/exercise needs.;Develop an individualized exercise prescription for aerobic and resistive training based on initial evaluation findings, risk stratification, comorbidities and participant's personal goals.       Expected Outcomes Achievement of increased cardiorespiratory fitness and enhanced flexibility, muscular endurance and strength shown through measurements of functional capacity and personal statement of participant.       Increase Strength and Stamina Yes       Intervention Provide advice, education, support and counseling about physical activity/exercise needs.;Develop an individualized exercise prescription for aerobic and resistive training based on initial evaluation findings, risk stratification, comorbidities and participant's personal goals.       Expected Outcomes Achievement of increased cardiorespiratory fitness  and enhanced flexibility, muscular endurance and strength shown through measurements of functional capacity and personal statement of participant.          Exercise Goals Re-Evaluation :     Exercise Goals Re-Evaluation    Row Name 08/02/16 1437             Exercise Goal Re-Evaluation   Exercise Goals Review Increase Physical Activity;Increase Strenth and Stamina       Comments Pt stated "feeling good and able to do yardwork without extreme fatigue/exhaustion"       Expected Outcomes Pt will continue to improve in aerobic and functional fitness.           Discharge Exercise Prescription (Final Exercise Prescription Changes):     Exercise Prescription Changes - 08/02/16 1400      Response to Exercise   Blood Pressure (Admit) 100/68   Blood Pressure (Exercise) 136/70   Blood Pressure (Exit) 106/60   Heart Rate (Admit) 79 bpm   Heart Rate (Exercise) 119 bpm   Heart Rate (Exit) 85 bpm   Rating of Perceived Exertion (Exercise) 12   Symptoms none   Comments pt was oriented to exercise  equipment on 07/11/16   Duration Continue with 30 min of aerobic exercise without signs/symptoms of physical distress.   Intensity THRR unchanged     Progression   Progression Continue to progress workloads to maintain intensity without signs/symptoms of physical distress.   Average METs 3.9     Resistance Training   Training Prescription Yes   Weight 4lbs   Reps 10-15   Time 10 Minutes     Treadmill   MPH 3.2   Grade 2   Minutes 15   METs 4.33     NuStep   Level 4   SPM 90   Minutes 15   METs 3.5      Nutrition:  Target Goals: Understanding of nutrition guidelines, daily intake of sodium 1500mg , cholesterol 200mg , calories 30% from fat and 7% or less from saturated fats, daily to have 5 or more servings of fruits and vegetables.  Biometrics:     Pre Biometrics - 07/05/16 1439      Pre Biometrics   Height 5\' 5"  (1.651 m)   Weight 145 lb 4.5 oz (65.9 kg)   Waist Circumference 29 inches   Hip Circumference 39 inches   Waist to Hip Ratio 0.74 %   BMI (Calculated) 24.2   Triceps Skinfold 33 mm   % Body Fat 35.5 %   Grip Strength 32 kg   Flexibility 15 in   Single Leg Stand 30 seconds       Nutrition Therapy Plan and Nutrition Goals:   Nutrition Discharge: Nutrition Scores:   Nutrition Goals Re-Evaluation:   Nutrition Goals Re-Evaluation:   Nutrition Goals Discharge (Final Nutrition Goals Re-Evaluation):   Psychosocial: Target Goals: Acknowledge presence or absence of significant depression and/or stress, maximize coping skills, provide positive support system. Participant is able to verbalize types and ability to use techniques and skills needed for reducing stress and depression.  Initial Review & Psychosocial Screening:     Initial Psych Review & Screening - 07/05/16 1613      Initial Review   Current issues with None Identified     Family Dynamics   Good Support System? Yes   Comments upon initial assessment no psychosocial needs  identified, no intervetions necessary      Barriers   Psychosocial barriers to participate in program There are  no identifiable barriers or psychosocial needs.     Screening Interventions   Interventions Encouraged to exercise;Provide feedback about the scores to participant      Quality of Life Scores:   PHQ-9: Recent Review Flowsheet Data    Depression screen [facility] 2/9 07/11/2016   Decreased Interest 0   Down, Depressed, Hopeless 1   PHQ - 2 Score 1     Interpretation of Total Score  Total Score Depression Severity:  1-4 = Minimal depression, 5-9 = Mild depression, 10-14 = Moderate depression, 15-19 = Moderately severe depression, 20-27 = Severe depression   Psychosocial Evaluation and Intervention:     Psychosocial Evaluation - 07/11/16 1636      Psychosocial Evaluation & Interventions   Interventions Encouraged to exercise with the program and follow exercise prescription;Stress management education;Relaxation education   Comments pt exhibits health related anxiety from recent cardiac event.  GI distress and fatigue from medication side effects have been discouraging to her. pt is looking forward to resuming previous level of activity and achieving full recovery.    Expected Outcomes pt will demonstrate positive outlook with good coping skills.   Continue Psychosocial Services  No Follow up required      Psychosocial Re-Evaluation:     Psychosocial Re-Evaluation    Row Name 07/29/16 0745             Psychosocial Re-Evaluation   Current issues with Current Stress Concerns       Comments pt with health related anxiety is adjusting well to cardiac rehab routine and has been able to incorporate more activity at home without difficulty.         Expected Outcomes pt will exhibit hopeful, positive outlook with good coping skills.        Interventions Encouraged to attend Cardiac Rehabilitation for the exercise;Stress management education       Continue Psychosocial  Services  Follow up required by staff          Psychosocial Discharge (Final Psychosocial Re-Evaluation):     Psychosocial Re-Evaluation - 07/29/16 0745      Psychosocial Re-Evaluation   Current issues with Current Stress Concerns   Comments pt with health related anxiety is adjusting well to cardiac rehab routine and has been able to incorporate more activity at home without difficulty.     Expected Outcomes pt will exhibit hopeful, positive outlook with good coping skills.    Interventions Encouraged to attend Cardiac Rehabilitation for the exercise;Stress management education   Continue Psychosocial Services  Follow up required by staff      Vocational Rehabilitation: Provide vocational rehab assistance to qualifying candidates.   Vocational Rehab Evaluation & Intervention:     Vocational Rehab - 07/05/16 1609      Initial Vocational Rehab Evaluation & Intervention   Assessment shows need for Vocational Rehabilitation --  pt did not return homework packet       Education: Education Goals: Education classes will be provided on a weekly basis, covering required topics. Participant will state understanding/return demonstration of topics presented.  Learning Barriers/Preferences:     Learning Barriers/Preferences - 07/05/16 0907      Learning Barriers/Preferences   Learning Preferences Pictoral;Video;Written Material;Computer/Internet      Education Topics: Count Your Pulse:  -Group instruction provided by verbal instruction, demonstration, patient participation and written materials to support subject.  Instructors address importance of being able to find your pulse and how to count your pulse when at home without a heart monitor.  Patients get hands on experience counting their pulse with staff help and individually.   CARDIAC REHAB PHASE II EXERCISE from 07/29/2016 in [facility] CARDIAC REHAB  Date  07/15/16  Instruction Review Code  2- meets  goals/outcomes      Heart Attack, Angina, and Risk Factor Modification:  -Group instruction provided by verbal instruction, video, and written materials to support subject.  Instructors address signs and symptoms of angina and heart attacks.    Also discuss risk factors for heart disease and how to make changes to improve heart health risk factors.   Functional Fitness:  -Group instruction provided by verbal instruction, demonstration, patient participation, and written materials to support subject.  Instructors address safety measures for doing things around the house.  Discuss how to get up and down off the floor, how to pick things up properly, how to safely get out of a chair without assistance, and balance training.   CARDIAC REHAB PHASE II EXERCISE from 07/29/2016 in [facility] CARDIAC REHAB  Date  07/29/16  Educator  Cristy Hilts  Instruction Review Code  2- meets goals/outcomes      Meditation and Mindfulness:  -Group instruction provided by verbal instruction, patient participation, and written materials to support subject.  Instructor addresses importance of mindfulness and meditation practice to help reduce stress and improve awareness.  Instructor also leads participants through a meditation exercise.    Stretching for Flexibility and Mobility:  -Group instruction provided by verbal instruction, patient participation, and written materials to support subject.  Instructors lead participants through series of stretches that are designed to increase flexibility thus improving mobility.  These stretches are additional exercise for major muscle groups that are typically performed during regular warm up and cool down.   Hands Only CPR:  -Group verbal, video, and participation provides a basic overview of AHA guidelines for community CPR. Role-play of emergencies allow participants the opportunity to practice calling for help and chest compression technique with  discussion of AED use.   Hypertension: -Group verbal and written instruction that provides a basic overview of hypertension including the most recent diagnostic guidelines, risk factor reduction with self-care instructions and medication management.    Nutrition I class: Heart Healthy Eating:  -Group instruction provided by PowerPoint slides, verbal discussion, and written materials to support subject matter. The instructor gives an explanation and review of the Therapeutic Lifestyle Changes diet recommendations, which includes a discussion on lipid goals, dietary fat, sodium, fiber, plant stanol/sterol esters, sugar, and the components of a well-balanced, healthy diet.   Nutrition II class: Lifestyle Skills:  -Group instruction provided by PowerPoint slides, verbal discussion, and written materials to support subject matter. The instructor gives an explanation and review of label reading, grocery shopping for heart health, heart healthy recipe modifications, and ways to make healthier choices when eating out.   Diabetes Question & Answer:  -Group instruction provided by PowerPoint slides, verbal discussion, and written materials to support subject matter. The instructor gives an explanation and review of diabetes co-morbidities, pre- and post-prandial blood glucose goals, pre-exercise blood glucose goals, signs, symptoms, and treatment of hypoglycemia and hyperglycemia, and foot care basics.   Diabetes Blitz:  -Group instruction provided by PowerPoint slides, verbal discussion, and written materials to support subject matter. The instructor gives an explanation and review of the physiology behind type 1 and type 2 diabetes, diabetes medications and rational behind using different medications, pre- and post-prandial blood glucose recommendations and Hemoglobin A1c goals, diabetes diet,  and exercise including blood glucose guidelines for exercising safely.    Portion Distortion:  -Group  instruction provided by PowerPoint slides, verbal discussion, written materials, and food models to support subject matter. The instructor gives an explanation of serving size versus portion size, changes in portions sizes over the last 20 years, and what consists of a serving from each food group.   Stress Management:  -Group instruction provided by verbal instruction, video, and written materials to support subject matter.  Instructors review role of stress in heart disease and how to cope with stress positively.     CARDIAC REHAB PHASE II EXERCISE from 07/29/2016 in [facility] CARDIAC REHAB  Date  07/13/16  Instruction Review Code  2- meets goals/outcomes      Exercising on Your Own:  -Group instruction provided by verbal instruction, power point, and written materials to support subject.  Instructors discuss benefits of exercise, components of exercise, frequency and intensity of exercise, and end points for exercise.  Also discuss use of nitroglycerin and activating EMS.  Review options of places to exercise outside of rehab.  Review guidelines for sex with heart disease.   Cardiac Drugs I:  -Group instruction provided by verbal instruction and written materials to support subject.  Instructor reviews cardiac drug classes: antiplatelets, anticoagulants, beta blockers, and statins.  Instructor discusses reasons, side effects, and lifestyle considerations for each drug class.   Cardiac Drugs II:  -Group instruction provided by verbal instruction and written materials to support subject.  Instructor reviews cardiac drug classes: angiotensin converting enzyme inhibitors (ACE-I), angiotensin II receptor blockers (ARBs), nitrates, and calcium channel blockers.  Instructor discusses reasons, side effects, and lifestyle considerations for each drug class.   Anatomy and Physiology of the Circulatory System:  Group verbal and written instruction and models provide basic cardiac  anatomy and physiology, with the coronary electrical and arterial systems. Review of: AMI, Angina, Valve disease, Heart Failure, Peripheral Artery Disease, Cardiac Arrhythmia, Pacemakers, and the ICD.   Other Education:  -Group or individual verbal, written, or video instructions that support the educational goals of the cardiac rehab program.   Knowledge Questionnaire Score:   Core Components/Risk Factors/Patient Goals at Admission:     Personal Goals and Risk Factors at Admission - 07/05/16 0910      Core Components/Risk Factors/Patient Goals on Admission   Personal Goal Other Yes   Personal Goal Have a better understanding of the healing process with cardiac surgery (AVR) and medication management   Intervention Provide cardiac education on disease process, medication management and risk factor modifications.    Expected Outcomes Pt will have increased knowledge and awareness on cardiovascular disease and medication management      Core Components/Risk Factors/Patient Goals Review:      Goals and Risk Factor Review    Row Name 07/29/16 0748 07/29/16 0805           Core Components/Risk Factors/Patient Goals Review   Personal Goals Review Other  Have a better understanding of the healing process with cardiac surgery (AVR) and medication management  -      Review pt is actively participating in cardiac rehab activities without difficulty. pt educated on sun sensitivity from medication side effects.  pt informed to use sunscreen and wear protective clothing for sun exposure.   -      Expected Outcomes  - Pt will have increased knowledge and awareness on cardiovascular disease and medication management         Core  Components/Risk Factors/Patient Goals at Discharge (Final Review):      Goals and Risk Factor Review - 07/29/16 0805      Core Components/Risk Factors/Patient Goals Review   Expected Outcomes Pt will have increased knowledge and awareness on cardiovascular  disease and medication management      ITP Comments:     ITP Comments    Row Name 07/05/16 0902           ITP Comments Medical Director, Dr. Armanda Magic          Comments: Pt is making expected progress toward personal goals after completing 8 sessions. Recommend continued exercise and life style modification education including  stress management and relaxation techniques to decrease cardiac risk profile.

## 2016-08-03 ENCOUNTER — Encounter (HOSPITAL_COMMUNITY)
Admission: RE | Admit: 2016-08-03 | Discharge: 2016-08-03 | Disposition: A | Discharge status: Home / Self Care | Payer: BC Managed Care – PPO | Source: Ambulatory Visit | Attending: Cardiovascular Disease | Admitting: Cardiovascular Disease

## 2016-08-03 DIAGNOSIS — Z952 Presence of prosthetic heart valve: Secondary | ICD-10-CM

## 2016-08-05 ENCOUNTER — Encounter (HOSPITAL_COMMUNITY): Payer: BC Managed Care – PPO

## 2016-08-10 ENCOUNTER — Other Ambulatory Visit: Payer: Self-pay

## 2016-08-10 ENCOUNTER — Encounter (HOSPITAL_COMMUNITY): Payer: BC Managed Care – PPO

## 2016-08-10 ENCOUNTER — Ambulatory Visit (HOSPITAL_COMMUNITY): Payer: BC Managed Care – PPO | Attending: Cardiovascular Disease

## 2016-08-10 DIAGNOSIS — I4891 Unspecified atrial fibrillation: Secondary | ICD-10-CM | POA: Diagnosis not present

## 2016-08-10 DIAGNOSIS — I371 Nonrheumatic pulmonary valve insufficiency: Secondary | ICD-10-CM | POA: Diagnosis not present

## 2016-08-10 DIAGNOSIS — Z952 Presence of prosthetic heart valve: Secondary | ICD-10-CM | POA: Diagnosis not present

## 2016-08-10 DIAGNOSIS — I083 Combined rheumatic disorders of mitral, aortic and tricuspid valves: Secondary | ICD-10-CM | POA: Diagnosis not present

## 2016-08-10 DIAGNOSIS — I359 Nonrheumatic aortic valve disorder, unspecified: Secondary | ICD-10-CM

## 2016-08-10 DIAGNOSIS — I429 Cardiomyopathy, unspecified: Secondary | ICD-10-CM | POA: Insufficient documentation

## 2016-08-12 ENCOUNTER — Encounter (HOSPITAL_COMMUNITY)
Admission: RE | Admit: 2016-08-12 | Discharge: 2016-08-12 | Disposition: A | Discharge status: Home / Self Care | Payer: BC Managed Care – PPO | Source: Ambulatory Visit | Attending: Cardiovascular Disease | Admitting: Cardiovascular Disease

## 2016-08-12 DIAGNOSIS — Z79899 Other long term (current) drug therapy: Secondary | ICD-10-CM | POA: Insufficient documentation

## 2016-08-12 DIAGNOSIS — M1990 Unspecified osteoarthritis, unspecified site: Secondary | ICD-10-CM | POA: Diagnosis not present

## 2016-08-12 DIAGNOSIS — F329 Major depressive disorder, single episode, unspecified: Secondary | ICD-10-CM | POA: Insufficient documentation

## 2016-08-12 DIAGNOSIS — Z7982 Long term (current) use of aspirin: Secondary | ICD-10-CM | POA: Insufficient documentation

## 2016-08-12 DIAGNOSIS — Z952 Presence of prosthetic heart valve: Secondary | ICD-10-CM | POA: Diagnosis present

## 2016-08-12 DIAGNOSIS — I481 Persistent atrial fibrillation: Secondary | ICD-10-CM | POA: Insufficient documentation

## 2016-08-12 DIAGNOSIS — Z7901 Long term (current) use of anticoagulants: Secondary | ICD-10-CM | POA: Insufficient documentation

## 2016-08-12 DIAGNOSIS — I5022 Chronic systolic (congestive) heart failure: Secondary | ICD-10-CM | POA: Diagnosis not present

## 2016-08-12 DIAGNOSIS — F419 Anxiety disorder, unspecified: Secondary | ICD-10-CM | POA: Diagnosis not present

## 2016-08-15 ENCOUNTER — Encounter (HOSPITAL_COMMUNITY)
Admission: RE | Admit: 2016-08-15 | Discharge: 2016-08-15 | Disposition: A | Discharge status: Home / Self Care | Payer: BC Managed Care – PPO | Source: Ambulatory Visit | Attending: Cardiovascular Disease | Admitting: Cardiovascular Disease

## 2016-08-15 DIAGNOSIS — Z952 Presence of prosthetic heart valve: Secondary | ICD-10-CM | POA: Diagnosis not present

## 2016-08-17 ENCOUNTER — Encounter (HOSPITAL_COMMUNITY)
Admission: RE | Admit: 2016-08-17 | Discharge: 2016-08-17 | Disposition: A | Discharge status: Home / Self Care | Payer: BC Managed Care – PPO | Source: Ambulatory Visit | Attending: Cardiovascular Disease | Admitting: Cardiovascular Disease

## 2016-08-17 DIAGNOSIS — Z952 Presence of prosthetic heart valve: Secondary | ICD-10-CM

## 2016-08-17 NOTE — Progress Notes (Signed)
Pt c/o continued dizziness associated with nausea with position changes, primarily when bending over or stooping.   BP stable at Cardiac rehab. Sinus rhythm.   Pt denies symptoms with CR activities, including standing up from floor position.    Pt reminded to change positions slowly and maintain adequate hydration.  Dr. Excell Seltzer made aware.

## 2016-08-17 NOTE — Progress Notes (Signed)
Wanda [name] 66 y.o. female Nutrition Note Spoke with pt. Nutrition plan and nutrition goals reviewed with pt. Pt has not returned MEDFICTS survey at this time. Pt wants to lose wt. Wt loss tips reviewed. Pt with dx of CHF. Per discussion, pt does not use canned/convenience foods often. Pt rarely adds salt to food. Pt eats out infrequently. Pt expressed understanding of the information reviewed. Pt aware of nutrition education classes offered.  Lab Results  Component Value Date   HGBA1C 5.4 04/29/2016   Wt Readings from Last 3 Encounters:  07/15/16 146 lb 4 oz (66.3 kg)  07/05/16 145 lb 4.5 oz (65.9 kg)  06/16/16 142 lb (64.4 kg)    Nutrition Diagnosis ? Food-and nutrition-related knowledge deficit related to lack of exposure to information as related to diagnosis of: ? CHF  Nutrition Intervention ? Pt's individual nutrition plan reviewed with pt. ? Benefits of adopting Therapeutic Lifestyle Changes discussed when Medficts reviewed. ? Pt to attend the Portion Distortion class ? Pt given handouts for: ? Nutrition I class ? Nutrition II class  ? Continue client-centered nutrition education by RD, as part of interdisciplinary care.  Goal(s) ? Pt to identify food quantities necessary to achieve weight loss of 6-10 lb at graduation from cardiac rehab.  Monitor and Evaluate progress toward nutrition goal with team. Mickle Plumb, M.Ed, RD, LDN, CDE 08/17/2016 2:29 PM

## 2016-08-19 ENCOUNTER — Telehealth: Payer: Self-pay | Admitting: Physician Assistant

## 2016-08-19 ENCOUNTER — Encounter (HOSPITAL_COMMUNITY)
Admission: RE | Admit: 2016-08-19 | Discharge: 2016-08-19 | Disposition: A | Discharge status: Home / Self Care | Payer: BC Managed Care – PPO | Source: Ambulatory Visit | Attending: Cardiovascular Disease | Admitting: Cardiovascular Disease

## 2016-08-19 DIAGNOSIS — Z952 Presence of prosthetic heart valve: Secondary | ICD-10-CM | POA: Diagnosis not present

## 2016-08-19 NOTE — Telephone Encounter (Signed)
    Called by Randa Evens in cardiac rehab. Patient has been having dizziness/nausea and SBP low ~94 in cardiac rehab today. I have asked her to decreased Toprol from 25mg  daily to 12.5mg  daily. She will call and make an appointment if this does not help her symptoms.   Cline Crock PA-C  [facility]

## 2016-08-21 ENCOUNTER — Other Ambulatory Visit: Payer: Self-pay | Admitting: Obstetrics & Gynecology

## 2016-08-22 ENCOUNTER — Encounter (HOSPITAL_COMMUNITY): Payer: BC Managed Care – PPO

## 2016-08-22 NOTE — Telephone Encounter (Signed)
Medication refill request: [place] [facility]  Last AEX:  11-26-15  Next AEX: 11-25-16  Last MMG (if hormonal medication request): 4-9-15right breast U/S WNL ( left message to schedule MMG)  Refill authorized: please advise

## 2016-08-23 NOTE — Telephone Encounter (Signed)
Patient is returning a call to Elaine. °

## 2016-08-23 NOTE — Telephone Encounter (Signed)
Spoke with patient regarding the need for MMG. Patient states she just had open heart surgery and is wanting to wait a few months for healing before scheduling mammogram. Please advise

## 2016-08-24 ENCOUNTER — Encounter (HOSPITAL_COMMUNITY): Payer: BC Managed Care – PPO

## 2016-08-25 ENCOUNTER — Ambulatory Visit: Payer: BC Managed Care – PPO | Admitting: Cardiothoracic Surgery

## 2016-08-26 ENCOUNTER — Encounter (HOSPITAL_COMMUNITY): Payer: BC Managed Care – PPO

## 2016-08-26 NOTE — Telephone Encounter (Signed)
Pharmacy is calling to check status of this refill. Please see Elaine's message below and advise. Thank you.

## 2016-08-26 NOTE — Telephone Encounter (Signed)
Pharmacy calling to check on status of refill request.

## 2016-08-29 ENCOUNTER — Encounter (HOSPITAL_COMMUNITY)
Admission: RE | Admit: 2016-08-29 | Discharge: 2016-08-29 | Disposition: A | Discharge status: Home / Self Care | Payer: BC Managed Care – PPO | Source: Ambulatory Visit | Attending: Cardiovascular Disease | Admitting: Cardiovascular Disease

## 2016-08-29 DIAGNOSIS — Z952 Presence of prosthetic heart valve: Secondary | ICD-10-CM | POA: Diagnosis not present

## 2016-08-29 NOTE — Telephone Encounter (Signed)
Pharmacy calling to check on the status of refill

## 2016-08-30 ENCOUNTER — Other Ambulatory Visit: Payer: Self-pay | Admitting: Obstetrics & Gynecology

## 2016-08-30 NOTE — Progress Notes (Signed)
Cardiac Individual Treatment Plan  Patient Details  Name: Wanda [name] MRN: [id number] Date of Birth: 12/31/1950 Referring Provider:     CARDIAC REHAB PHASE II ORIENTATION from 07/05/2016 in [facility]ARD[facility]  Referring Provider  Tonny Bollman, MD.      Initial Encounter Date:    CARDIAC REHAB PHASE II ORIENTATION from 07/05/2016 in [facility] CARDIAC REHAB  Date  07/05/16  Referring Provider  Tonny Bollman, MD.      Visit Diagnosis: 05/03/16 S/P AVR (aortic valve replacement)  Patient's Home Medications on Admission:  Current Outpatient Prescriptions:  .  amphetamine-dextroamphetamine (ADDERALL) 20 MG tablet, Take 20 mg by mouth daily. Reported on 08/18/2015, Disp: , Rfl:  .  aspirin EC 325 MG tablet, Take 1 tablet (325 mg total) by mouth daily., Disp: 30 tablet, Rfl: 0 .  estradiol (MINIVELLE) 0.05 MG/24HR patch, Place 1 patch (0.05 mg total) onto the skin 2 (two) times a week., Disp: 8 patch, Rfl: 13 .  LORazepam (ATIVAN) 0.5 MG tablet, Take 0.5 mg by mouth daily as needed for anxiety. , Disp: , Rfl:  .  metoprolol succinate (TOPROL-XL) 25 MG 24 hr tablet, Take 1 tablet (25 mg total) by mouth at bedtime., Disp: 90 tablet, Rfl: 3 .  progesterone (PROMETRIUM) 100 MG capsule, Take 100 mg by mouth at bedtime., Disp: , Rfl:  .  rizatriptan (MAXALT-MLT) 10 MG disintegrating tablet, Take 10 mg by mouth once as needed for migraine. May repeat in 2 hours if needed, Disp: , Rfl:  .  traMADol (ULTRAM) 50 MG tablet, Take 1-2 tablets (50-100 mg total) by mouth every 4 (four) hours as needed for moderate pain. (Patient not taking: Reported on 07/05/2016), Disp: 30 tablet, Rfl: 0  Past Medical History: Past Medical History:  Diagnosis Date  . Anxiety   . Arthritis   . Chronic systolic heart failure (HCC) 05/25/2016  . Depression   . Migraine   . Persistent atrial fibrillation (HCC) 05/13/2016   Post AVR // Amiodarone // Coumadin started; possible  duration 6-8 weeks  . PVC's (premature ventricular contractions) 05/25/2016   Holter 11/17 The basic rhythm is sinus. There are frequent PVCs, periods of ventricular bigeminy, and occasional ventricular couplets and triplets.  . S/P AVR (aortic valve replacement)    Echo 11/17:  EF 40-45, mild diff HK, Gr 2 DD, possibly bicuspid AV with very mild AS and severe AI, mild MR, mild LAE // R/L [facility] 1/18: LAD irregs prox 20, Normal R heart hemodynamics // Intraop TEE 05/03/16:  EF 35-40, severe AI, mild MR  . S/P AVR (aortic valve replacement)    2/2 aortic insufficiency // Echo 11/17:  EF 40-45, mild diff HK, Gr 2 DD, possibly bicuspid AV with very mild AS and severe AI, mild MR, mild LAE // R/L [facility] 1/18: LAD irregs prox 20, Normal R heart hemodynamics // Intraop TEE 05/03/16:  EF 35-40, severe AI, mild MR    Tobacco Use: History  Smoking Status  . Former Smoker  Smokeless Tobacco  . Never Used    Comment: in college    Labs: Recent Hydrographic surveyor    Labs for ITP Cardiac and Pulmonary Rehab Latest Ref Rng & Units 05/03/2016 05/04/2016 05/05/2016 05/06/2016 05/07/2016   Cholestrol 125 - 200 mg/dL - - - - -   LDLCALC <409 mg/dL - - - - -   HDL >=81 mg/dL - - - - -   Trlycerides <150 mg/dL - - - - -  Hemoglobin A1c 4.8 - 5.6 % - - - - -   PHART 7.350 - 7.450 - - - - -   PCO2ART 32.0 - 48.0 mmHg - - - - -   HCO3 20.0 - 28.0 mmol/L - - - - -   TCO2 0 - 100 mmol/L 21 23 - - -   ACIDBASEDEF 0.0 - 2.0 mmol/L - - - - -   O2SAT % - 80.1 80.7 84.1 67.0      Capillary Blood Glucose: Lab Results  Component Value Date   GLUCAP 91 05/06/2016   GLUCAP 90 05/06/2016   GLUCAP 62 (L) 05/06/2016   GLUCAP 128 (H) 05/06/2016   GLUCAP 98 05/06/2016     Exercise Target Goals:    Exercise Program Goal: Individual exercise prescription set with THRR, safety & activity barriers. Participant demonstrates ability to understand and report RPE using BORG scale, to self-measure pulse accurately, and to  acknowledge the importance of the exercise prescription.  Exercise Prescription Goal: Starting with aerobic activity 30 plus minutes a day, 3 days per week for initial exercise prescription. Provide home exercise prescription and guidelines that participant acknowledges understanding prior to discharge.  Activity Barriers & Risk Stratification:     Activity Barriers & Cardiac Risk Stratification - 07/05/16 0905      Activity Barriers & Cardiac Risk Stratification   Activity Barriers Deconditioning;Muscular Weakness;Shortness of Breath   Cardiac Risk Stratification High      6 Minute Walk:     6 Minute Walk    Row Name 07/05/16 1437         6 Minute Walk   Phase Initial     Distance 1682 feet     Walk Time 6 minutes     # of Rest Breaks 0     MPH 3.19     METS 3.91     RPE 9     VO2 Peak 13.68     Symptoms No     Resting HR 68 bpm     Resting BP 110/76     Max Ex. HR 93 bpm     Max Ex. BP 122/70     2 Minute Post BP 114/72        Oxygen Initial Assessment:   Oxygen Re-Evaluation:   Oxygen Discharge (Final Oxygen Re-Evaluation):   Initial Exercise Prescription:     Initial Exercise Prescription - 07/05/16 1400      Date of Initial Exercise RX and Referring Provider   Date 07/05/16   Referring Provider Tonny Bollman, MD.     Treadmill   MPH 2.3   Grade 1   Minutes 10   METs 3.08     Bike   Level 1   Minutes 10   METs 3.9     NuStep   Level 3   SPM 85   Minutes 10   METs 2.8     Prescription Details   Frequency (times per week) 3   Duration Progress to 30 minutes of continuous aerobic without signs/symptoms of physical distress     Intensity   THRR 40-80% of Max Heartrate 62-125   Ratings of Perceived Exertion 11-13   Perceived Dyspnea 0-4     Progression   Progression Continue to progress workloads to maintain intensity without signs/symptoms of physical distress.     Resistance Training   Training Prescription Yes   Weight  2lbs.   Reps 10-15      Perform Capillary Blood Glucose  checks as needed.  Exercise Prescription Changes:     Exercise Prescription Changes    Row Name 07/20/16 1100 08/02/16 1400 08/16/16 1600         Response to Exercise   Blood Pressure (Admit) 104/60 100/68 124/80     Blood Pressure (Exercise) 136/82 136/70 132/70     Blood Pressure (Exit) 106/74 106/60 116/86     Heart Rate (Admit) 88 bpm 79 bpm 78 bpm     Heart Rate (Exercise) 133 bpm 119 bpm 129 bpm     Heart Rate (Exit) 88 bpm 85 bpm 76 bpm     Rating of Perceived Exertion (Exercise) 12 12 12      Symptoms none none none     Comments pt was oriented to exercise equipment on 07/11/16 pt was oriented to exercise equipment on 07/11/16 pt was oriented to exercise equipment on 07/11/16     Duration Continue with 30 min of aerobic exercise without signs/symptoms of physical distress. Continue with 30 min of aerobic exercise without signs/symptoms of physical distress. Continue with 30 min of aerobic exercise without signs/symptoms of physical distress.     Intensity THRR unchanged THRR unchanged THRR unchanged       Progression   Progression Continue to progress workloads to maintain intensity without signs/symptoms of physical distress. Continue to progress workloads to maintain intensity without signs/symptoms of physical distress. Continue to progress workloads to maintain intensity without signs/symptoms of physical distress.     Average METs 3.5 3.9 4.1       Resistance Training   Training Prescription Yes Yes Yes     Weight 4lbs 4lbs 4lbs     Reps 10-15 10-15 10-15     Time 10 Minutes 10 Minutes 10 Minutes       Treadmill   MPH 2.6 3.2 3.2     Grade 2 2 2      Minutes 10 15 15      METs 3.71 4.33 4.33       Bike   Level 1  - 1     Minutes 10  - 15     METs 3.9  - 3.9       NuStep   Level 3 4  -     SPM 85 90  -     Minutes 10 15  -     METs 2.9 3.5  -        Exercise Comments:     Exercise Comments     Row Name 08/02/16 1437 08/29/16 1557         Exercise Comments Reviewed METs and goals, Pt is tolerating exercise well; will continue to monitor exercise progression. Reviewed METs and goals, Pt is tolerating exercise well; will continue to monitor exercise progression.         Exercise Goals and Review:     Exercise Goals    Row Name 07/05/16 0908             Exercise Goals   Increase Physical Activity Yes       Intervention Provide advice, education, support and counseling about physical activity/exercise needs.;Develop an individualized exercise prescription for aerobic and resistive training based on initial evaluation findings, risk stratification, comorbidities and participant's personal goals.       Expected Outcomes Achievement of increased cardiorespiratory fitness and enhanced flexibility, muscular endurance and strength shown through measurements of functional capacity and personal statement of participant.       Increase Strength and Stamina  Yes       Intervention Provide advice, education, support and counseling about physical activity/exercise needs.;Develop an individualized exercise prescription for aerobic and resistive training based on initial evaluation findings, risk stratification, comorbidities and participant's personal goals.       Expected Outcomes Achievement of increased cardiorespiratory fitness and enhanced flexibility, muscular endurance and strength shown through measurements of functional capacity and personal statement of participant.          Exercise Goals Re-Evaluation :     Exercise Goals Re-Evaluation    Row Name 08/02/16 1437 08/17/16 1528           Exercise Goal Re-Evaluation   Exercise Goals Review Increase Physical Activity;Increase Strenth and Stamina Increase Physical Activity;Increase Strenth and Stamina      Comments Pt stated "feeling good and able to do yardwork without extreme fatigue/exhaustion" pt reports decreased fatigue and  increased confidence in functional ability.  pt is walking at home on non CR days.      Expected Outcomes Pt will continue to improve in aerobic and functional fitness. Pt will continue to improve in aerobic and functional fitness.          Discharge Exercise Prescription (Final Exercise Prescription Changes):     Exercise Prescription Changes - 08/16/16 1600      Response to Exercise   Blood Pressure (Admit) 124/80   Blood Pressure (Exercise) 132/70   Blood Pressure (Exit) 116/86   Heart Rate (Admit) 78 bpm   Heart Rate (Exercise) 129 bpm   Heart Rate (Exit) 76 bpm   Rating of Perceived Exertion (Exercise) 12   Symptoms none   Comments pt was oriented to exercise equipment on 07/11/16   Duration Continue with 30 min of aerobic exercise without signs/symptoms of physical distress.   Intensity THRR unchanged     Progression   Progression Continue to progress workloads to maintain intensity without signs/symptoms of physical distress.   Average METs 4.1     Resistance Training   Training Prescription Yes   Weight 4lbs   Reps 10-15   Time 10 Minutes     Treadmill   MPH 3.2   Grade 2   Minutes 15   METs 4.33     Bike   Level 1   Minutes 15   METs 3.9      Nutrition:  Target Goals: Understanding of nutrition guidelines, daily intake of sodium 1500mg , cholesterol 200mg , calories 30% from fat and 7% or less from saturated fats, daily to have 5 or more servings of fruits and vegetables.  Biometrics:     Pre Biometrics - 07/05/16 1439      Pre Biometrics   Height 5\' 5"  (1.651 m)   Weight 145 lb 4.5 oz (65.9 kg)   Waist Circumference 29 inches   Hip Circumference 39 inches   Waist to Hip Ratio 0.74 %   BMI (Calculated) 24.2   Triceps Skinfold 33 mm   % Body Fat 35.5 %   Grip Strength 32 kg   Flexibility 15 in   Single Leg Stand 30 seconds       Nutrition Therapy Plan and Nutrition Goals:     Nutrition Therapy & Goals - 08/17/16 1432      Nutrition  Therapy   Diet Low Sodium     Personal Nutrition Goals   Nutrition Goal Wt loss of 1-2 lb/week to a wt loss goal of 6-10 lb at graduation from Cardiac Rehab.     Intervention  Plan   Intervention Prescribe, educate and counsel regarding individualized specific dietary modifications aiming towards targeted core components such as weight, hypertension, lipid management, diabetes, heart failure and other comorbidities.   Expected Outcomes Short Term Goal: Understand basic principles of dietary content, such as calories, fat, sodium, cholesterol and nutrients.;Long Term Goal: Adherence to prescribed nutrition plan.      Nutrition Discharge: Nutrition Scores:   Nutrition Goals Re-Evaluation:   Nutrition Goals Re-Evaluation:   Nutrition Goals Discharge (Final Nutrition Goals Re-Evaluation):   Psychosocial: Target Goals: Acknowledge presence or absence of significant depression and/or stress, maximize coping skills, provide positive support system. Participant is able to verbalize types and ability to use techniques and skills needed for reducing stress and depression.  Initial Review & Psychosocial Screening:     Initial Psych Review & Screening - 07/05/16 1613      Initial Review   Current issues with None Identified     Family Dynamics   Good Support System? Yes   Comments upon initial assessment no psychosocial needs identified, no intervetions necessary      Barriers   Psychosocial barriers to participate in program There are no identifiable barriers or psychosocial needs.     Screening Interventions   Interventions Encouraged to exercise;Provide feedback about the scores to participant      Quality of Life Scores:   PHQ-9: Recent Review Flowsheet Data    Depression screen [facility] 2/9 07/11/2016   Decreased Interest 0   Down, Depressed, Hopeless 1   PHQ - 2 Score 1     Interpretation of Total Score  Total Score Depression Severity:  1-4 = Minimal depression, 5-9 =  Mild depression, 10-14 = Moderate depression, 15-19 = Moderately severe depression, 20-27 = Severe depression   Psychosocial Evaluation and Intervention:     Psychosocial Evaluation - 07/11/16 1636      Psychosocial Evaluation & Interventions   Interventions Encouraged to exercise with the program and follow exercise prescription;Stress management education;Relaxation education   Comments pt exhibits health related anxiety from recent cardiac event.  GI distress and fatigue from medication side effects have been discouraging to her. pt is looking forward to resuming previous level of activity and achieving full recovery.    Expected Outcomes pt will demonstrate positive outlook with good coping skills.   Continue Psychosocial Services  No Follow up required      Psychosocial Re-Evaluation:     Psychosocial Re-Evaluation    Row Name 07/29/16 0745 08/17/16 1530           Psychosocial Re-Evaluation   Current issues with Current Stress Concerns Current Stress Concerns      Comments pt with health related anxiety is adjusting well to cardiac rehab routine and has been able to incorporate more activity at home without difficulty.   pt with health related anxiety is adjusting well to cardiac rehab routine and has been able to incorporate more activity at home without difficulty.        Expected Outcomes pt will exhibit hopeful, positive outlook with good coping skills.  pt will exhibit hopeful, positive outlook with good coping skills.       Interventions Encouraged to attend Cardiac Rehabilitation for the exercise;Stress management education Encouraged to attend Cardiac Rehabilitation for the exercise;Stress management education      Continue Psychosocial Services  Follow up required by staff Follow up required by staff         Psychosocial Discharge (Final Psychosocial Re-Evaluation):     Psychosocial  Re-Evaluation - 08/17/16 1530      Psychosocial Re-Evaluation   Current issues  with Current Stress Concerns   Comments pt with health related anxiety is adjusting well to cardiac rehab routine and has been able to incorporate more activity at home without difficulty.     Expected Outcomes pt will exhibit hopeful, positive outlook with good coping skills.    Interventions Encouraged to attend Cardiac Rehabilitation for the exercise;Stress management education   Continue Psychosocial Services  Follow up required by staff      Vocational Rehabilitation: Provide vocational rehab assistance to qualifying candidates.   Vocational Rehab Evaluation & Intervention:     Vocational Rehab - 07/05/16 1609      Initial Vocational Rehab Evaluation & Intervention   Assessment shows need for Vocational Rehabilitation --  pt did not return homework packet       Education: Education Goals: Education classes will be provided on a weekly basis, covering required topics. Participant will state understanding/return demonstration of topics presented.  Learning Barriers/Preferences:     Learning Barriers/Preferences - 07/05/16 0907      Learning Barriers/Preferences   Learning Preferences Pictoral;Video;Written Material;Computer/Internet      Education Topics: Count Your Pulse:  -Group instruction provided by verbal instruction, demonstration, patient participation and written materials to support subject.  Instructors address importance of being able to find your pulse and how to count your pulse when at home without a heart monitor.  Patients get hands on experience counting their pulse with staff help and individually.   CARDIAC REHAB PHASE II EXERCISE from 08/17/2016 in [facility] CARDIAC REHAB  Date  07/15/16  Instruction Review Code  2- meets goals/outcomes      Heart Attack, Angina, and Risk Factor Modification:  -Group instruction provided by verbal instruction, video, and written materials to support subject.  Instructors address signs and symptoms  of angina and heart attacks.    Also discuss risk factors for heart disease and how to make changes to improve heart health risk factors.   Functional Fitness:  -Group instruction provided by verbal instruction, demonstration, patient participation, and written materials to support subject.  Instructors address safety measures for doing things around the house.  Discuss how to get up and down off the floor, how to pick things up properly, how to safely get out of a chair without assistance, and balance training.   CARDIAC REHAB PHASE II EXERCISE from 08/17/2016 in [facility] CARDIAC REHAB  Date  07/29/16  Educator  Cristy Hilts  Instruction Review Code  2- meets goals/outcomes      Meditation and Mindfulness:  -Group instruction provided by verbal instruction, patient participation, and written materials to support subject.  Instructor addresses importance of mindfulness and meditation practice to help reduce stress and improve awareness.  Instructor also leads participants through a meditation exercise.    Stretching for Flexibility and Mobility:  -Group instruction provided by verbal instruction, patient participation, and written materials to support subject.  Instructors lead participants through series of stretches that are designed to increase flexibility thus improving mobility.  These stretches are additional exercise for major muscle groups that are typically performed during regular warm up and cool down.   Hands Only CPR:  -Group verbal, video, and participation provides a basic overview of AHA guidelines for community CPR. Role-play of emergencies allow participants the opportunity to practice calling for help and chest compression technique with discussion of AED use.   Hypertension: -Group verbal  and written instruction that provides a basic overview of hypertension including the most recent diagnostic guidelines, risk factor reduction with self-care  instructions and medication management.    Nutrition I class: Heart Healthy Eating:  -Group instruction provided by PowerPoint slides, verbal discussion, and written materials to support subject matter. The instructor gives an explanation and review of the Therapeutic Lifestyle Changes diet recommendations, which includes a discussion on lipid goals, dietary fat, sodium, fiber, plant stanol/sterol esters, sugar, and the components of a well-balanced, healthy diet.   CARDIAC REHAB PHASE II EXERCISE from 08/17/2016 in [facility] CARDIAC REHAB  Date  08/17/16  Educator  RD  Instruction Review Code  Not applicable [class handouts given]      Nutrition II class: Lifestyle Skills:  -Group instruction provided by PowerPoint slides, verbal discussion, and written materials to support subject matter. The instructor gives an explanation and review of label reading, grocery shopping for heart health, heart healthy recipe modifications, and ways to make healthier choices when eating out.   CARDIAC REHAB PHASE II EXERCISE from 08/17/2016 in [facility] CARDIAC REHAB  Date  08/17/16  Educator  RD  Instruction Review Code  Not applicable [class handouts given]      Diabetes Question & Answer:  -Group instruction provided by PowerPoint slides, verbal discussion, and written materials to support subject matter. The instructor gives an explanation and review of diabetes co-morbidities, pre- and post-prandial blood glucose goals, pre-exercise blood glucose goals, signs, symptoms, and treatment of hypoglycemia and hyperglycemia, and foot care basics.   Diabetes Blitz:  -Group instruction provided by PowerPoint slides, verbal discussion, and written materials to support subject matter. The instructor gives an explanation and review of the physiology behind type 1 and type 2 diabetes, diabetes medications and rational behind using different medications, pre- and post-prandial  blood glucose recommendations and Hemoglobin A1c goals, diabetes diet, and exercise including blood glucose guidelines for exercising safely.    Portion Distortion:  -Group instruction provided by PowerPoint slides, verbal discussion, written materials, and food models to support subject matter. The instructor gives an explanation of serving size versus portion size, changes in portions sizes over the last 20 years, and what consists of a serving from each food group.   Stress Management:  -Group instruction provided by verbal instruction, video, and written materials to support subject matter.  Instructors review role of stress in heart disease and how to cope with stress positively.     CARDIAC REHAB PHASE II EXERCISE from 08/17/2016 in [facility] CARDIAC REHAB  Date  07/13/16  Instruction Review Code  2- meets goals/outcomes      Exercising on Your Own:  -Group instruction provided by verbal instruction, power point, and written materials to support subject.  Instructors discuss benefits of exercise, components of exercise, frequency and intensity of exercise, and end points for exercise.  Also discuss use of nitroglycerin and activating EMS.  Review options of places to exercise outside of rehab.  Review guidelines for sex with heart disease.   Cardiac Drugs I:  -Group instruction provided by verbal instruction and written materials to support subject.  Instructor reviews cardiac drug classes: antiplatelets, anticoagulants, beta blockers, and statins.  Instructor discusses reasons, side effects, and lifestyle considerations for each drug class.   Cardiac Drugs II:  -Group instruction provided by verbal instruction and written materials to support subject.  Instructor reviews cardiac drug classes: angiotensin converting enzyme inhibitors (ACE-I), angiotensin II receptor blockers (ARBs), nitrates,  and calcium channel blockers.  Instructor discusses reasons, side effects, and  lifestyle considerations for each drug class.   Anatomy and Physiology of the Circulatory System:  Group verbal and written instruction and models provide basic cardiac anatomy and physiology, with the coronary electrical and arterial systems. Review of: AMI, Angina, Valve disease, Heart Failure, Peripheral Artery Disease, Cardiac Arrhythmia, Pacemakers, and the ICD.   Other Education:  -Group or individual verbal, written, or video instructions that support the educational goals of the cardiac rehab program.   Knowledge Questionnaire Score:   Core Components/Risk Factors/Patient Goals at Admission:     Personal Goals and Risk Factors at Admission - 07/05/16 0910      Core Components/Risk Factors/Patient Goals on Admission   Personal Goal Other Yes   Personal Goal Have a better understanding of the healing process with cardiac surgery (AVR) and medication management   Intervention Provide cardiac education on disease process, medication management and risk factor modifications.    Expected Outcomes Pt will have increased knowledge and awareness on cardiovascular disease and medication management      Core Components/Risk Factors/Patient Goals Review:      Goals and Risk Factor Review    Row Name 07/29/16 0748 07/29/16 0805 08/17/16 1529         Core Components/Risk Factors/Patient Goals Review   Personal Goals Review Other  Have a better understanding of the healing process with cardiac surgery (AVR) and medication management  - Other     Review pt is actively participating in cardiac rehab activities without difficulty. pt educated on sun sensitivity from medication side effects.  pt informed to use sunscreen and wear protective clothing for sun exposure.   - pt is actively participating in cardiac rehab activities without difficulty. pt reports decreased fatigue and sun sensitivity. pt also notes increased confidence.       Expected Outcomes  - Pt will have increased  knowledge and awareness on cardiovascular disease and medication management  -        Core Components/Risk Factors/Patient Goals at Discharge (Final Review):      Goals and Risk Factor Review - 08/17/16 1529      Core Components/Risk Factors/Patient Goals Review   Personal Goals Review Other   Review pt is actively participating in cardiac rehab activities without difficulty. pt reports decreased fatigue and sun sensitivity. pt also notes increased confidence.        ITP Comments:     ITP Comments    Row Name 07/05/16 0902 08/30/16 1642         ITP Comments Medical Director, Dr. Armanda Magic Medical Director, Dr. Armanda Magic         Comments: Pt is making expected progress toward personal goals after completing 13 sessions. Recommend continued exercise and life style modification education including  stress management and relaxation techniques to decrease cardiac risk profile.

## 2016-08-30 NOTE — Telephone Encounter (Signed)
Medication refill request: Progesterone Last AEX:  08/18/15 SM Next AEX: 11/25/16 SM Last MMG (if hormonal medication request): 06/12/13 BIRADS0, [facility]; 06/20/13 Dx & U/S R Breast [facility]2, [facility] Refill authorized: Historical Provider. Please advise. Thank you.

## 2016-08-30 NOTE — Progress Notes (Signed)
August 19, 2016 (late entry)  Pt c/o dizziness associated with nausea after standing or position change. Pt reports these symptoms are not as severe as prior to discontinuing amiodarone and metoprolol succinate.  These medications produced weakness and falling.  However dizziness and nausea have been persistent since starting metoprolol tartrate. Pt has been active working in her yard, moving stepping stones. Pt instructed to increase PO fluid intake while doing yardwork and reminded of temperature guidelines. PC to Carlean Jews, PA to discuss pt symptoms , BP:  91/62 post exercise, 111/67, 82 sitting, 99/71, 90 standing.   "Decrease metoprolol 25mg  1/2tab qHS, pt should contact office if symptoms persist for office visit."  vo Carlean Jews, Jesus Genera, RN.  Pt verbalized understanding. Pt will be absent from rehab next week for previously scheduled vacation. Pt will monitor BP and symptoms while away

## 2016-08-31 ENCOUNTER — Encounter (HOSPITAL_COMMUNITY)
Admission: RE | Admit: 2016-08-31 | Discharge: 2016-08-31 | Disposition: A | Discharge status: Home / Self Care | Payer: BC Managed Care – PPO | Source: Ambulatory Visit | Attending: Cardiovascular Disease | Admitting: Cardiovascular Disease

## 2016-08-31 ENCOUNTER — Telehealth: Payer: Self-pay

## 2016-08-31 DIAGNOSIS — Z952 Presence of prosthetic heart valve: Secondary | ICD-10-CM

## 2016-08-31 NOTE — Telephone Encounter (Addendum)
Left message to call Kaitlyn at [phone].  ----- Message from Jerene Bears, MD sent at 08/31/2016  1:27 PM EDT ----- Regarding: refill for HRT Can you please call pt?  RF requests for her HRT came via EMR.  She recently had aoritc vale repair and had afib issues after the repair.  Unless she's had a specific conversation with her cardiologist about this, I think she needs to be off the HRT.  May need consultation.  Refills have been denied.  I wanted her to know why.  Thanks.

## 2016-08-31 NOTE — Telephone Encounter (Signed)
Pt will be called via triage.  Pt has significant cardiac surgery earlier this year.  I was not aware this occurred until the refill request.  She had afib issues post op and is on medication as well as full dose aspirin.  Have advised cessation.  Ok to close encounter as triage will call.

## 2016-09-01 NOTE — Telephone Encounter (Signed)
Spoke with patient. Advised of message as seen below from Dr.Miller. Patient verbalizes understanding. States she has reviewed her medications with her cardiologist and was not advised to discontinue any of her current medications. Advised will review with Dr.Miller and return call. Patient is agreeable.  Call to Dr.Gerhardt's office. Per assistant who answered the phone their office does not open until 9 am and requests I return call after 9.

## 2016-09-02 ENCOUNTER — Encounter (HOSPITAL_COMMUNITY)
Admission: RE | Admit: 2016-09-02 | Discharge: 2016-09-02 | Disposition: A | Discharge status: Home / Self Care | Payer: BC Managed Care – PPO | Source: Ambulatory Visit | Attending: Cardiovascular Disease | Admitting: Cardiovascular Disease

## 2016-09-02 DIAGNOSIS — Z952 Presence of prosthetic heart valve: Secondary | ICD-10-CM

## 2016-09-03 ENCOUNTER — Other Ambulatory Visit: Payer: Self-pay | Admitting: Obstetrics & Gynecology

## 2016-09-05 ENCOUNTER — Encounter (HOSPITAL_COMMUNITY)
Admission: RE | Admit: 2016-09-05 | Discharge: 2016-09-05 | Disposition: A | Discharge status: Home / Self Care | Payer: BC Managed Care – PPO | Source: Ambulatory Visit | Attending: Cardiovascular Disease | Admitting: Cardiovascular Disease

## 2016-09-05 DIAGNOSIS — Z952 Presence of prosthetic heart valve: Secondary | ICD-10-CM | POA: Diagnosis not present

## 2016-09-07 ENCOUNTER — Encounter (HOSPITAL_COMMUNITY)
Admission: RE | Admit: 2016-09-07 | Discharge: 2016-09-07 | Disposition: A | Discharge status: Home / Self Care | Payer: BC Managed Care – PPO | Source: Ambulatory Visit | Attending: Cardiovascular Disease | Admitting: Cardiovascular Disease

## 2016-09-07 DIAGNOSIS — Z952 Presence of prosthetic heart valve: Secondary | ICD-10-CM | POA: Diagnosis not present

## 2016-09-09 ENCOUNTER — Telehealth: Payer: Self-pay | Admitting: Cardiovascular Disease

## 2016-09-09 ENCOUNTER — Encounter (HOSPITAL_COMMUNITY): Payer: BC Managed Care – PPO

## 2016-09-09 NOTE — Telephone Encounter (Signed)
Call to Dr.Gerhardt's office. Spoke with nurse Darl Pikes who states Dr.Cooper is managing the patient;'s medications. Call to Dr.Cooper's office. Spoke with Carney Bern who is going to send a message to Dr.Cooper's nurse for return call.

## 2016-09-09 NOTE — Telephone Encounter (Signed)
Opened in error

## 2016-09-09 NOTE — Telephone Encounter (Signed)
Attempt to return call to [facility] open until 5, phones off at 4:30.  Will have to call back Monday.

## 2016-09-09 NOTE — Telephone Encounter (Signed)
In the [facility] studies, an increased risk of DVT, PE, stroke, and myocardial infarction was observed in women ?65 years with daily conjugated estrogens combined with medroxyprogesterone. The current recommendation is generally to use the lowest dose that treats symptoms for shortest duration.

## 2016-09-09 NOTE — Telephone Encounter (Signed)
New Message     Is it safe for the patient to continue taking this hormone replacement medication :  Progesterone and a estradiol patch?

## 2016-09-12 ENCOUNTER — Encounter (HOSPITAL_COMMUNITY)
Admission: RE | Admit: 2016-09-12 | Discharge: 2016-09-12 | Disposition: A | Discharge status: Home / Self Care | Payer: BC Managed Care – PPO | Source: Ambulatory Visit | Attending: Cardiovascular Disease | Admitting: Cardiovascular Disease

## 2016-09-12 DIAGNOSIS — Z7982 Long term (current) use of aspirin: Secondary | ICD-10-CM | POA: Insufficient documentation

## 2016-09-12 DIAGNOSIS — F419 Anxiety disorder, unspecified: Secondary | ICD-10-CM | POA: Diagnosis not present

## 2016-09-12 DIAGNOSIS — Z7901 Long term (current) use of anticoagulants: Secondary | ICD-10-CM | POA: Insufficient documentation

## 2016-09-12 DIAGNOSIS — Z79899 Other long term (current) drug therapy: Secondary | ICD-10-CM | POA: Diagnosis not present

## 2016-09-12 DIAGNOSIS — I481 Persistent atrial fibrillation: Secondary | ICD-10-CM | POA: Insufficient documentation

## 2016-09-12 DIAGNOSIS — M1990 Unspecified osteoarthritis, unspecified site: Secondary | ICD-10-CM | POA: Diagnosis not present

## 2016-09-12 DIAGNOSIS — F329 Major depressive disorder, single episode, unspecified: Secondary | ICD-10-CM | POA: Insufficient documentation

## 2016-09-12 DIAGNOSIS — I5022 Chronic systolic (congestive) heart failure: Secondary | ICD-10-CM | POA: Diagnosis not present

## 2016-09-12 DIAGNOSIS — Z952 Presence of prosthetic heart valve: Secondary | ICD-10-CM

## 2016-09-13 NOTE — Telephone Encounter (Signed)
Left message to call Kaitlyn at [phone]. 

## 2016-09-13 NOTE — Telephone Encounter (Signed)
I spoke with [facility] by phone and made her aware of recommendation from our pharmacist.  I will also fax this note to [phone] for the pt's records.

## 2016-09-13 NOTE — Telephone Encounter (Signed)
Spoke with Dr.Cooper's nurse Lauren who states medications have been reviewed with clinical pharmacist who states that due to the risk of stroke, DVT, and MI it is recommended that the patient use the lowest dose for the shortest amount of time or discontinue the medication. This recommendation will be faxed to our office for the patient's chart.

## 2016-09-13 NOTE — Telephone Encounter (Signed)
I would really like her to stop it and see what symptoms she has after stopping.

## 2016-09-15 ENCOUNTER — Encounter: Payer: Self-pay | Admitting: Cardiothoracic Surgery

## 2016-09-15 ENCOUNTER — Ambulatory Visit (INDEPENDENT_AMBULATORY_CARE_PROVIDER_SITE_OTHER): Payer: BC Managed Care – PPO | Admitting: Cardiothoracic Surgery

## 2016-09-15 VITALS — BP 115/76 | HR 80 | Resp 16 | Ht 64.0 in | Wt 140.0 lb

## 2016-09-15 DIAGNOSIS — Z952 Presence of prosthetic heart valve: Secondary | ICD-10-CM | POA: Diagnosis not present

## 2016-09-15 DIAGNOSIS — I350 Nonrheumatic aortic (valve) stenosis: Secondary | ICD-10-CM | POA: Diagnosis not present

## 2016-09-15 NOTE — Patient Instructions (Signed)

## 2016-09-15 NOTE — Progress Notes (Signed)
[address]       [place] [address]             [phone]      DEBORA MEKONNEN [facility] Medical Record #[id number] Date of Birth: Jul 21, 1950  Referring: Tonny Bollman, MD Primary Care: [place], [name], MD  Chief Complaint:   POST OP FOLLOW UP 05/03/2016  OPERATIVE REPORT PREOPERATIVE DIAGNOSIS:  Bicuspid aortic valve with severe aortic insufficiency. POSTOPERATIVE DIAGNOSIS:  Bicuspid aortic valve with severe aortic insufficiency. SURGICAL PROCEDURE:  Aortic valve replacement with pericardial tissue valve [employer], 21 mm model 3300TFX, serial [id number]. SURGEON:  Sheliah Plane, MD  History of Present Illness:     Patient returns office in follow-up after aortic valve replacement for severe aortic insufficiency and a bicuspid aortic valve. He's currently involved in cardiac rehabilitation and doing well. She has no signs or symptoms of congestive heart failure. She is exceeding her target heart rate during cardiac rehabilitation.  She originally presented with  chronic severe aortic valve insufficiency with combined systolic and diastolic heart failure. She underwent bioprosthetic aortic valve replacement 05/03/2016. Her postoperative course was complicated by atrial fibrillation with RVR requiring amiodarone. She's had no further atrial fibrillation that she is aware of.  Follow-up echo in May showed good function of aortic valve, ejection fraction remained stable at 40-45%.   Past Medical History:  Diagnosis Date  . Anxiety   . Arthritis   . Chronic systolic heart failure (HCC) 05/25/2016  . Depression   . Migraine   . Persistent atrial fibrillation (HCC) 05/13/2016   Post AVR // Amiodarone // Coumadin started; possible duration 6-8 weeks  . PVC's (premature ventricular contractions) 05/25/2016   Holter 11/17 The basic rhythm is sinus. There are frequent PVCs, periods of ventricular bigeminy, and occasional ventricular couplets and  triplets.  . S/P AVR (aortic valve replacement)    Echo 11/17:  EF 40-45, mild diff HK, Gr 2 DD, possibly bicuspid AV with very mild AS and severe AI, mild MR, mild LAE // R/L [facility] 1/18: LAD irregs prox 20, Normal R heart hemodynamics // Intraop TEE 05/03/16:  EF 35-40, severe AI, mild MR  . S/P AVR (aortic valve replacement)    2/2 aortic insufficiency // Echo 11/17:  EF 40-45, mild diff HK, Gr 2 DD, possibly bicuspid AV with very mild AS and severe AI, mild MR, mild LAE // R/L [facility] 1/18: LAD irregs prox 20, Normal R heart hemodynamics // Intraop TEE 05/03/16:  EF 35-40, severe AI, mild MR     History  Smoking Status  . Former Smoker  Smokeless Tobacco  . Never Used    Comment: in college    History  Alcohol Use  . Yes    Comment: 1-2 glasses of wine     Allergies  Allergen Reactions  . Codeine Nausea And Vomiting  . Vicodin [Hydrocodone-Acetaminophen] Nausea And Vomiting    Current Outpatient Prescriptions  Medication Sig Dispense Refill  . amphetamine-dextroamphetamine (ADDERALL) 20 MG tablet Take 20 mg by mouth daily. Reported on 08/18/2015    . aspirin EC 325 MG tablet Take 1 tablet (325 mg total) by mouth daily. 30 tablet 0  . LORazepam (ATIVAN) 0.5 MG tablet Take 0.5 mg by mouth daily as needed for anxiety.     . metoprolol succinate (TOPROL-XL) 25 MG 24 hr tablet Take 1 tablet (25 mg total) by mouth at bedtime. 90 tablet 3  . rizatriptan (MAXALT-MLT) 10 MG  disintegrating tablet Take 10 mg by mouth once as needed for migraine. May repeat in 2 hours if needed    . estradiol (MINIVELLE) 0.05 MG/24HR patch Place 1 patch (0.05 mg total) onto the skin 2 (two) times a week. (Patient not taking: Reported on 09/15/2016) 8 patch 13  . progesterone (PROMETRIUM) 100 MG capsule Take 100 mg by mouth at bedtime.     No current facility-administered medications for this visit.        Physical Exam: BP 115/76 (BP Location: Right Arm, Patient Position: Sitting, Cuff Size: Normal)   Pulse 80    Resp 16   Ht 5\' 4"  (1.626 m)   Wt 140 lb (63.5 kg)   LMP 08/12/2004   SpO2 99% Comment: ON RA  BMI 24.03 kg/m   General appearance: alert, cooperative and no distress Neurologic: intact Heart: regular rate and rhythm, S1, S2 normal, no murmur, click, rub or gallop Lungs: clear to auscultation bilaterally Abdomen: soft, non-tender; bowel sounds normal; no masses,  no organomegaly Extremities: extremities normal, atraumatic, no cyanosis or edema and Homans sign is negative, no sign of DVT Wound: sternum stable and well healed   Diagnostic Studies & Laboratory data:     Recent Radiology Findings:   No results found.    Recent Lab Findings: Lab Results  Component Value Date   WBC 6.3 05/08/2016   HGB 10.3 (L) 05/08/2016   HCT 32.7 (L) 05/08/2016   PLT 200 05/08/2016   GLUCOSE 97 05/10/2016   CHOL 247 (H) 08/18/2015   TRIG 73 08/18/2015   HDL 108 08/18/2015   LDLCALC 124 08/18/2015   ALT 12 (L) 04/29/2016   AST 17 04/29/2016   NA 137 05/10/2016   K 4.0 05/10/2016   CL 96 (L) 05/10/2016   CREATININE 1.03 (H) 05/10/2016   BUN 12 05/10/2016   CO2 34 (H) 05/10/2016   TSH 1.39 08/18/2015   INR 1.8 07/07/2016   HGBA1C 5.4 04/29/2016      Assessment / Plan:   Stable after recent aortic valve replacement for severe aortic insufficiency, now with well-functioning prosthetic valve, tissue The patient has a family history of bicuspid aortic valve both in herself and in her brother, in addition her brother had a dilated ascending aorta. She will discussed with her sister about screening for aortic valve disease, aortic dilatation   With the patient's prosthetic heart valve the risks of endocarditis have been discussed. The recommendations for periprocedural antibiotics including dental procedures have been discussed with the patient.  I'll plan to see her back as needed, she is closely followed by cardiology  [name] MD      [address] [place] [address] Office [phone]   Beeper [phone]  09/15/2016 1:10 PM

## 2016-09-16 ENCOUNTER — Encounter (HOSPITAL_COMMUNITY): Payer: BC Managed Care – PPO

## 2016-09-16 ENCOUNTER — Other Ambulatory Visit: Payer: Self-pay | Admitting: Obstetrics & Gynecology

## 2016-09-16 MED ORDER — RIZATRIPTAN BENZOATE 10 MG PO TBDP: 10.0000 mg | ORAL_TABLET | Freq: Once | ORAL | 1 refills | Status: DC | PRN

## 2016-09-16 NOTE — Telephone Encounter (Signed)
Patient returned call. Message given to patient as seen below from Dr. Hyacinth Meeker and she verbalized understanding. Patient also asking if Dr. Hyacinth Meeker is okay with her waiting a little longer to get her MMG until her chest is completely healed from the surgery. RN advised she would check with Dr. Hyacinth Meeker and return call. Patient also has AEX scheduled for 11/25/16 and wondered if Dr. Hyacinth Meeker could refill her prescription for maxalt prior to her aex, or if she needed to wait until September to have it filled. Patient states she takes it "sparingly, but when she needs it she needs it."   Routing to provider for review.

## 2016-09-16 NOTE — Telephone Encounter (Signed)
Rx for maxalt done today.  Waiting a few more weeks until she is more fully healed is just fine.  Even if there was an abnormality, a few weeks wouldn't make a difference.  Thanks.

## 2016-09-19 ENCOUNTER — Encounter (HOSPITAL_COMMUNITY)
Admission: RE | Admit: 2016-09-19 | Discharge: 2016-09-19 | Disposition: A | Discharge status: Home / Self Care | Payer: BC Managed Care – PPO | Source: Ambulatory Visit | Attending: Cardiovascular Disease | Admitting: Cardiovascular Disease

## 2016-09-19 DIAGNOSIS — Z952 Presence of prosthetic heart valve: Secondary | ICD-10-CM | POA: Diagnosis not present

## 2016-09-19 NOTE — Telephone Encounter (Signed)
Call to patient. Message given to patient as seen below from Dr. Hyacinth Meeker. Patient verbalized understanding and appreciative of phone call.

## 2016-09-21 ENCOUNTER — Encounter (HOSPITAL_COMMUNITY)
Admission: RE | Admit: 2016-09-21 | Discharge: 2016-09-21 | Disposition: A | Discharge status: Home / Self Care | Payer: BC Managed Care – PPO | Source: Ambulatory Visit | Attending: Cardiovascular Disease | Admitting: Cardiovascular Disease

## 2016-09-21 DIAGNOSIS — Z952 Presence of prosthetic heart valve: Secondary | ICD-10-CM | POA: Diagnosis not present

## 2016-09-23 ENCOUNTER — Telehealth (HOSPITAL_COMMUNITY): Payer: Self-pay | Admitting: Cardiac Rehabilitation

## 2016-09-23 ENCOUNTER — Encounter (HOSPITAL_COMMUNITY): Payer: BC Managed Care – PPO

## 2016-09-23 NOTE — Telephone Encounter (Signed)
-----   Message from Tonny Bollman, MD sent at 09/23/2016  5:06 AM EDT ----- Regarding: RE: cardiac rehab  Sorry for the late response. I think the post-op afib is behind her so shouldn't be an issue anymore. Maxalt should be ok prn and I don't think HRT is contraindicated.   thx Kathlene November ----- Message ----- From: Robyne Peers, RN Sent: 09/06/2016   6:54 AM To: Tonny Bollman, MD Subject: cardiac rehab                                  Dear Dr. Excell Seltzer,  [facility] gyn  has recommended she stop her HRT due to her recent post op afib.  Do you agree this is necessary?  Also, pt takes Maxalt PRN migraine headaches.  Would it be necessary for her to stop this?  She reports significant relief from headaches with maxalt however she uses it very sparingly and infrequently.    Thank you, Deveron Furlong, RN, BSN Cardiac Pulmonary Rehab

## 2016-09-26 ENCOUNTER — Encounter (HOSPITAL_COMMUNITY)
Admission: RE | Admit: 2016-09-26 | Discharge: 2016-09-26 | Disposition: A | Discharge status: Home / Self Care | Payer: BC Managed Care – PPO | Source: Ambulatory Visit | Attending: Cardiovascular Disease | Admitting: Cardiovascular Disease

## 2016-09-26 DIAGNOSIS — Z952 Presence of prosthetic heart valve: Secondary | ICD-10-CM

## 2016-09-27 NOTE — Progress Notes (Signed)
Cardiac Individual Treatment Plan  Patient Details  Name: Wanda [name] MRN: [id number] Date of Birth: 20-May-1950 Referring Provider:     CARDIAC REHAB PHASE II ORIENTATION from 07/05/2016 in [facility] CARDIAC [facility]  Referring Provider  Tonny Bollman, MD.      Initial Encounter Date:    CARDIAC REHAB PHASE II ORIENTATION from 07/05/2016 in [facility] CARDIAC REHAB  Date  07/05/16  Referring Provider  Tonny Bollman, MD.      Visit Diagnosis: 05/03/16 S/P AVR (aortic valve replacement)  Patient's Home Medications on Admission:  Current Outpatient Prescriptions:  .  amphetamine-dextroamphetamine (ADDERALL) 20 MG tablet, Take 20 mg by mouth daily. Reported on 08/18/2015, Disp: , Rfl:  .  aspirin EC 325 MG tablet, Take 1 tablet (325 mg total) by mouth daily., Disp: 30 tablet, Rfl: 0 .  LORazepam (ATIVAN) 0.5 MG tablet, Take 0.5 mg by mouth daily as needed for anxiety. , Disp: , Rfl:  .  metoprolol succinate (TOPROL-XL) 25 MG 24 hr tablet, Take 1 tablet (25 mg total) by mouth at bedtime., Disp: 90 tablet, Rfl: 3 .  rizatriptan (MAXALT-MLT) 10 MG disintegrating tablet, Take 1 tablet (10 mg total) by mouth once as needed for migraine. May repeat in 2 hours if needed, Disp: 10 tablet, Rfl: 1  Past Medical History: Past Medical History:  Diagnosis Date  . Anxiety   . Arthritis   . Chronic systolic heart failure (HCC) 05/25/2016  . Depression   . Migraine   . Persistent atrial fibrillation (HCC) 05/13/2016   Post AVR // Amiodarone // Coumadin started; possible duration 6-8 weeks  . PVC's (premature ventricular contractions) 05/25/2016   Holter 11/17 The basic rhythm is sinus. There are frequent PVCs, periods of ventricular bigeminy, and occasional ventricular couplets and triplets.  . S/P AVR (aortic valve replacement)    Echo 11/17:  EF 40-45, mild diff HK, Gr 2 DD, possibly bicuspid AV with very mild AS and severe AI, mild MR, mild LAE // R/L [facility] 1/18: LAD  irregs prox 20, Normal R heart hemodynamics // Intraop TEE 05/03/16:  EF 35-40, severe AI, mild MR  . S/P AVR (aortic valve replacement)    2/2 aortic insufficiency // Echo 11/17:  EF 40-45, mild diff HK, Gr 2 DD, possibly bicuspid AV with very mild AS and severe AI, mild MR, mild LAE // R/L [facility] 1/18: LAD irregs prox 20, Normal R heart hemodynamics // Intraop TEE 05/03/16:  EF 35-40, severe AI, mild MR    Tobacco Use: History  Smoking Status  . Former Smoker  Smokeless Tobacco  . Never Used    Comment: in college    Labs: Recent Hydrographic surveyor    Labs for ITP Cardiac and Pulmonary Rehab Latest Ref Rng & Units 05/03/2016 05/04/2016 05/05/2016 05/06/2016 05/07/2016   Cholestrol 125 - 200 mg/dL - - - - -   LDLCALC <409 mg/dL - - - - -   HDL >=81 mg/dL - - - - -   Trlycerides <150 mg/dL - - - - -   Hemoglobin A1c 4.8 - 5.6 % - - - - -   PHART 7.350 - 7.450 - - - - -   PCO2ART 32.0 - 48.0 mmHg - - - - -   HCO3 20.0 - 28.0 mmol/L - - - - -   TCO2 0 - 100 mmol/L 21 23 - - -   ACIDBASEDEF 0.0 - 2.0 mmol/L - - - - -  O2SAT % - 80.1 80.7 84.1 67.0      Capillary Blood Glucose: Lab Results  Component Value Date   GLUCAP 91 05/06/2016   GLUCAP 90 05/06/2016   GLUCAP 62 (L) 05/06/2016   GLUCAP 128 (H) 05/06/2016   GLUCAP 98 05/06/2016     Exercise Target Goals:    Exercise Program Goal: Individual exercise prescription set with THRR, safety & activity barriers. Participant demonstrates ability to understand and report RPE using BORG scale, to self-measure pulse accurately, and to acknowledge the importance of the exercise prescription.  Exercise Prescription Goal: Starting with aerobic activity 30 plus minutes a day, 3 days per week for initial exercise prescription. Provide home exercise prescription and guidelines that participant acknowledges understanding prior to discharge.  Activity Barriers & Risk Stratification:     Activity Barriers & Cardiac Risk Stratification -  07/05/16 0905      Activity Barriers & Cardiac Risk Stratification   Activity Barriers Deconditioning;Muscular Weakness;Shortness of Breath   Cardiac Risk Stratification High      6 Minute Walk:     6 Minute Walk    Row Name 07/05/16 1437         6 Minute Walk   Phase Initial     Distance 1682 feet     Walk Time 6 minutes     # of Rest Breaks 0     MPH 3.19     METS 3.91     RPE 9     VO2 Peak 13.68     Symptoms No     Resting HR 68 bpm     Resting BP 110/76     Max Ex. HR 93 bpm     Max Ex. BP 122/70     2 Minute Post BP 114/72        Oxygen Initial Assessment:   Oxygen Re-Evaluation:   Oxygen Discharge (Final Oxygen Re-Evaluation):   Initial Exercise Prescription:     Initial Exercise Prescription - 07/05/16 1400      Date of Initial Exercise RX and Referring Provider   Date 07/05/16   Referring Provider Tonny Bollman, MD.     Treadmill   MPH 2.3   Grade 1   Minutes 10   METs 3.08     Bike   Level 1   Minutes 10   METs 3.9     NuStep   Level 3   SPM 85   Minutes 10   METs 2.8     Prescription Details   Frequency (times per week) 3   Duration Progress to 30 minutes of continuous aerobic without signs/symptoms of physical distress     Intensity   THRR 40-80% of Max Heartrate 62-125   Ratings of Perceived Exertion 11-13   Perceived Dyspnea 0-4     Progression   Progression Continue to progress workloads to maintain intensity without signs/symptoms of physical distress.     Resistance Training   Training Prescription Yes   Weight 2lbs.   Reps 10-15      Perform Capillary Blood Glucose checks as needed.  Exercise Prescription Changes:     Exercise Prescription Changes    Row Name 07/20/16 1100 08/02/16 1400 08/16/16 1600 09/07/16 1356 09/27/16 1300     Response to Exercise   Blood Pressure (Admit) 104/60 100/68 124/80 116/70 114/60   Blood Pressure (Exercise) 136/82 136/70 132/70 130/80 142/82   Blood Pressure (Exit)  106/74 106/60 116/86 118/82 120/60   Heart Rate (Admit) 88  bpm 79 bpm 78 bpm 84 bpm 79 bpm   Heart Rate (Exercise) 133 bpm 119 bpm 129 bpm 141 bpm 125 bpm   Heart Rate (Exit) 88 bpm 85 bpm 76 bpm 89 bpm 82 bpm   Rating of Perceived Exertion (Exercise) 12 12 12 13 12    Symptoms none none none none none   Comments pt was oriented to exercise equipment on 07/11/16 pt was oriented to exercise equipment on 07/11/16 pt was oriented to exercise equipment on 07/11/16  -  -   Duration Continue with 30 min of aerobic exercise without signs/symptoms of physical distress. Continue with 30 min of aerobic exercise without signs/symptoms of physical distress. Continue with 30 min of aerobic exercise without signs/symptoms of physical distress. Continue with 30 min of aerobic exercise without signs/symptoms of physical distress. Continue with 30 min of aerobic exercise without signs/symptoms of physical distress.   Intensity THRR unchanged THRR unchanged THRR unchanged THRR unchanged THRR unchanged     Progression   Progression Continue to progress workloads to maintain intensity without signs/symptoms of physical distress. Continue to progress workloads to maintain intensity without signs/symptoms of physical distress. Continue to progress workloads to maintain intensity without signs/symptoms of physical distress. Continue to progress workloads to maintain intensity without signs/symptoms of physical distress. Continue to progress workloads to maintain intensity without signs/symptoms of physical distress.   Average METs 3.5 3.9 4.1 5 5      Resistance Training   Training Prescription Yes Yes Yes Yes Yes   Weight 4lbs 4lbs 4lbs 5lbs 5lbs   Reps 10-15 10-15 10-15 10-15 10-15   Time 10 Minutes 10 Minutes 10 Minutes 10 Minutes 10 Minutes     Treadmill   MPH 2.6 3.2 3.2 3.5 3.7   Grade 2 2 2 2 3    Minutes 10 15 15 15 15    METs 3.71 4.33 4.33 4.65 5.36     Bike   Level 1  - 1 1.5  -   Minutes 10  - 15 15  -    METs 3.9  - 3.9 5.31  -     NuStep   Level 3 4  -  - 5   SPM 85 90  -  - 90   Minutes 10 15  -  - 15   METs 2.9 3.5  -  - 4.6      Exercise Comments:     Exercise Comments    Row Name 08/02/16 1437 08/29/16 1557 09/27/16 1358       Exercise Comments Reviewed METs and goals, Pt is tolerating exercise well; will continue to monitor exercise progression. Reviewed METs and goals, Pt is tolerating exercise well; will continue to monitor exercise progression. Reviewed METs and goals, Pt is tolerating exercise well; will continue to monitor exercise progression.        Exercise Goals and Review:     Exercise Goals    Row Name 07/05/16 0908             Exercise Goals   Increase Physical Activity Yes       Intervention Provide advice, education, support and counseling about physical activity/exercise needs.;Develop an individualized exercise prescription for aerobic and resistive training based on initial evaluation findings, risk stratification, comorbidities and participant's personal goals.       Expected Outcomes Achievement of increased cardiorespiratory fitness and enhanced flexibility, muscular endurance and strength shown through measurements of functional capacity and personal statement of participant.  Increase Strength and Stamina Yes       Intervention Provide advice, education, support and counseling about physical activity/exercise needs.;Develop an individualized exercise prescription for aerobic and resistive training based on initial evaluation findings, risk stratification, comorbidities and participant's personal goals.       Expected Outcomes Achievement of increased cardiorespiratory fitness and enhanced flexibility, muscular endurance and strength shown through measurements of functional capacity and personal statement of participant.          Exercise Goals Re-Evaluation :     Exercise Goals Re-Evaluation    Row Name 08/02/16 1437 08/17/16 1528 09/27/16  1358         Exercise Goal Re-Evaluation   Exercise Goals Review Increase Physical Activity;Increase Strenth and Stamina Increase Physical Activity;Increase Strenth and Stamina Increase Physical Activity;Increase Strenth and Stamina     Comments Pt stated "feeling good and able to do yardwork without extreme fatigue/exhaustion" pt reports decreased fatigue and increased confidence in functional ability.  pt is walking at home on non CR days. Pt is very compliant with HEP and works very hard in cardiac rehab. Pt stated  "having an increase in confidence with exercise due to cardiac rehab"     Expected Outcomes Pt will continue to improve in aerobic and functional fitness. Pt will continue to improve in aerobic and functional fitness. Pt will continue to improve in aerobic and functional fitness.         Discharge Exercise Prescription (Final Exercise Prescription Changes):     Exercise Prescription Changes - 09/27/16 1300      Response to Exercise   Blood Pressure (Admit) 114/60   Blood Pressure (Exercise) 142/82   Blood Pressure (Exit) 120/60   Heart Rate (Admit) 79 bpm   Heart Rate (Exercise) 125 bpm   Heart Rate (Exit) 82 bpm   Rating of Perceived Exertion (Exercise) 12   Symptoms none   Duration Continue with 30 min of aerobic exercise without signs/symptoms of physical distress.   Intensity THRR unchanged     Progression   Progression Continue to progress workloads to maintain intensity without signs/symptoms of physical distress.   Average METs 5     Resistance Training   Training Prescription Yes   Weight 5lbs   Reps 10-15   Time 10 Minutes     Treadmill   MPH 3.7   Grade 3   Minutes 15   METs 5.36     NuStep   Level 5   SPM 90   Minutes 15   METs 4.6      Nutrition:  Target Goals: Understanding of nutrition guidelines, daily intake of sodium 1500mg , cholesterol 200mg , calories 30% from fat and 7% or less from saturated fats, daily to have 5 or more  servings of fruits and vegetables.  Biometrics:     Pre Biometrics - 07/05/16 1439      Pre Biometrics   Height 5\' 5"  (1.651 m)   Weight 145 lb 4.5 oz (65.9 kg)   Waist Circumference 29 inches   Hip Circumference 39 inches   Waist to Hip Ratio 0.74 %   BMI (Calculated) 24.2   Triceps Skinfold 33 mm   % Body Fat 35.5 %   Grip Strength 32 kg   Flexibility 15 in   Single Leg Stand 30 seconds       Nutrition Therapy Plan and Nutrition Goals:     Nutrition Therapy & Goals - 08/17/16 1432      Nutrition Therapy  Diet Low Sodium     Personal Nutrition Goals   Nutrition Goal Wt loss of 1-2 lb/week to a wt loss goal of 6-10 lb at graduation from Cardiac Rehab.     Intervention Plan   Intervention Prescribe, educate and counsel regarding individualized specific dietary modifications aiming towards targeted core components such as weight, hypertension, lipid management, diabetes, heart failure and other comorbidities.   Expected Outcomes Short Term Goal: Understand basic principles of dietary content, such as calories, fat, sodium, cholesterol and nutrients.;Long Term Goal: Adherence to prescribed nutrition plan.      Nutrition Discharge: Nutrition Scores:   Nutrition Goals Re-Evaluation:   Nutrition Goals Re-Evaluation:   Nutrition Goals Discharge (Final Nutrition Goals Re-Evaluation):   Psychosocial: Target Goals: Acknowledge presence or absence of significant depression and/or stress, maximize coping skills, provide positive support system. Participant is able to verbalize types and ability to use techniques and skills needed for reducing stress and depression.  Initial Review & Psychosocial Screening:     Initial Psych Review & Screening - 07/05/16 1613      Initial Review   Current issues with None Identified     Family Dynamics   Good Support System? Yes   Comments upon initial assessment no psychosocial needs identified, no intervetions necessary       Barriers   Psychosocial barriers to participate in program There are no identifiable barriers or psychosocial needs.     Screening Interventions   Interventions Encouraged to exercise;Provide feedback about the scores to participant      Quality of Life Scores:   PHQ-9: Recent Review Flowsheet Data    Depression screen [facility] 2/9 07/11/2016   Decreased Interest 0   Down, Depressed, Hopeless 1   PHQ - 2 Score 1     Interpretation of Total Score  Total Score Depression Severity:  1-4 = Minimal depression, 5-9 = Mild depression, 10-14 = Moderate depression, 15-19 = Moderately severe depression, 20-27 = Severe depression   Psychosocial Evaluation and Intervention:     Psychosocial Evaluation - 07/11/16 1636      Psychosocial Evaluation & Interventions   Interventions Encouraged to exercise with the program and follow exercise prescription;Stress management education;Relaxation education   Comments pt exhibits health related anxiety from recent cardiac event.  GI distress and fatigue from medication side effects have been discouraging to her. pt is looking forward to resuming previous level of activity and achieving full recovery.    Expected Outcomes pt will demonstrate positive outlook with good coping skills.   Continue Psychosocial Services  No Follow up required      Psychosocial Re-Evaluation:     Psychosocial Re-Evaluation    Row Name 07/29/16 0745 08/17/16 1530 09/26/16 1629         Psychosocial Re-Evaluation   Current issues with Current Stress Concerns Current Stress Concerns Current Stress Concerns     Comments pt with health related anxiety is adjusting well to cardiac rehab routine and has been able to incorporate more activity at home without difficulty.   pt with health related anxiety is adjusting well to cardiac rehab routine and has been able to incorporate more activity at home without difficulty.   pt with health related anxiety is adjusting well to cardiac  rehab routine and has been able to incorporate more activity at home without difficulty.       Expected Outcomes pt will exhibit hopeful, positive outlook with good coping skills.  pt will exhibit hopeful, positive outlook with good coping  skills.  pt will exhibit hopeful, positive outlook with good coping skills.      Interventions Encouraged to attend Cardiac Rehabilitation for the exercise;Stress management education Encouraged to attend Cardiac Rehabilitation for the exercise;Stress management education Encouraged to attend Cardiac Rehabilitation for the exercise;Stress management education     Continue Psychosocial Services  Follow up required by staff Follow up required by staff Follow up required by staff        Psychosocial Discharge (Final Psychosocial Re-Evaluation):     Psychosocial Re-Evaluation - 09/26/16 1629      Psychosocial Re-Evaluation   Current issues with Current Stress Concerns   Comments pt with health related anxiety is adjusting well to cardiac rehab routine and has been able to incorporate more activity at home without difficulty.     Expected Outcomes pt will exhibit hopeful, positive outlook with good coping skills.    Interventions Encouraged to attend Cardiac Rehabilitation for the exercise;Stress management education   Continue Psychosocial Services  Follow up required by staff      Vocational Rehabilitation: Provide vocational rehab assistance to qualifying candidates.   Vocational Rehab Evaluation & Intervention:     Vocational Rehab - 07/05/16 1609      Initial Vocational Rehab Evaluation & Intervention   Assessment shows need for Vocational Rehabilitation --  pt did not return homework packet       Education: Education Goals: Education classes will be provided on a weekly basis, covering required topics. Participant will state understanding/return demonstration of topics presented.  Learning Barriers/Preferences:     Learning  Barriers/Preferences - 07/05/16 0907      Learning Barriers/Preferences   Learning Preferences Pictoral;Video;Written Material;Computer/Internet      Education Topics: Count Your Pulse:  -Group instruction provided by verbal instruction, demonstration, patient participation and written materials to support subject.  Instructors address importance of being able to find your pulse and how to count your pulse when at home without a heart monitor.  Patients get hands on experience counting their pulse with staff help and individually.   CARDIAC REHAB PHASE II EXERCISE from 08/17/2016 in [facility] CARDIAC REHAB  Date  07/15/16  Instruction Review Code  2- meets goals/outcomes      Heart Attack, Angina, and Risk Factor Modification:  -Group instruction provided by verbal instruction, video, and written materials to support subject.  Instructors address signs and symptoms of angina and heart attacks.    Also discuss risk factors for heart disease and how to make changes to improve heart health risk factors.   Functional Fitness:  -Group instruction provided by verbal instruction, demonstration, patient participation, and written materials to support subject.  Instructors address safety measures for doing things around the house.  Discuss how to get up and down off the floor, how to pick things up properly, how to safely get out of a chair without assistance, and balance training.   CARDIAC REHAB PHASE II EXERCISE from 08/17/2016 in [facility] CARDIAC REHAB  Date  07/29/16  Educator  Cristy Hilts  Instruction Review Code  2- meets goals/outcomes      Meditation and Mindfulness:  -Group instruction provided by verbal instruction, patient participation, and written materials to support subject.  Instructor addresses importance of mindfulness and meditation practice to help reduce stress and improve awareness.  Instructor also leads participants through a  meditation exercise.    Stretching for Flexibility and Mobility:  -Group instruction provided by verbal instruction, patient participation, and written materials  to support subject.  Instructors lead participants through series of stretches that are designed to increase flexibility thus improving mobility.  These stretches are additional exercise for major muscle groups that are typically performed during regular warm up and cool down.   Hands Only CPR:  -Group verbal, video, and participation provides a basic overview of AHA guidelines for community CPR. Role-play of emergencies allow participants the opportunity to practice calling for help and chest compression technique with discussion of AED use.   Hypertension: -Group verbal and written instruction that provides a basic overview of hypertension including the most recent diagnostic guidelines, risk factor reduction with self-care instructions and medication management.    Nutrition I class: Heart Healthy Eating:  -Group instruction provided by PowerPoint slides, verbal discussion, and written materials to support subject matter. The instructor gives an explanation and review of the Therapeutic Lifestyle Changes diet recommendations, which includes a discussion on lipid goals, dietary fat, sodium, fiber, plant stanol/sterol esters, sugar, and the components of a well-balanced, healthy diet.   CARDIAC REHAB PHASE II EXERCISE from 08/17/2016 in [facility] CARDIAC REHAB  Date  08/17/16  Educator  RD  Instruction Review Code  Not applicable [class handouts given]      Nutrition II class: Lifestyle Skills:  -Group instruction provided by PowerPoint slides, verbal discussion, and written materials to support subject matter. The instructor gives an explanation and review of label reading, grocery shopping for heart health, heart healthy recipe modifications, and ways to make healthier choices when eating out.   CARDIAC REHAB  PHASE II EXERCISE from 08/17/2016 in [facility] CARDIAC REHAB  Date  08/17/16  Educator  RD  Instruction Review Code  Not applicable [class handouts given]      Diabetes Question & Answer:  -Group instruction provided by PowerPoint slides, verbal discussion, and written materials to support subject matter. The instructor gives an explanation and review of diabetes co-morbidities, pre- and post-prandial blood glucose goals, pre-exercise blood glucose goals, signs, symptoms, and treatment of hypoglycemia and hyperglycemia, and foot care basics.   Diabetes Blitz:  -Group instruction provided by PowerPoint slides, verbal discussion, and written materials to support subject matter. The instructor gives an explanation and review of the physiology behind type 1 and type 2 diabetes, diabetes medications and rational behind using different medications, pre- and post-prandial blood glucose recommendations and Hemoglobin A1c goals, diabetes diet, and exercise including blood glucose guidelines for exercising safely.    Portion Distortion:  -Group instruction provided by PowerPoint slides, verbal discussion, written materials, and food models to support subject matter. The instructor gives an explanation of serving size versus portion size, changes in portions sizes over the last 20 years, and what consists of a serving from each food group.   Stress Management:  -Group instruction provided by verbal instruction, video, and written materials to support subject matter.  Instructors review role of stress in heart disease and how to cope with stress positively.     CARDIAC REHAB PHASE II EXERCISE from 08/17/2016 in [facility] CARDIAC REHAB  Date  07/13/16  Instruction Review Code  2- meets goals/outcomes      Exercising on Your Own:  -Group instruction provided by verbal instruction, power point, and written materials to support subject.  Instructors discuss benefits of  exercise, components of exercise, frequency and intensity of exercise, and end points for exercise.  Also discuss use of nitroglycerin and activating EMS.  Review options of places to exercise outside  of rehab.  Review guidelines for sex with heart disease.   Cardiac Drugs I:  -Group instruction provided by verbal instruction and written materials to support subject.  Instructor reviews cardiac drug classes: antiplatelets, anticoagulants, beta blockers, and statins.  Instructor discusses reasons, side effects, and lifestyle considerations for each drug class.   Cardiac Drugs II:  -Group instruction provided by verbal instruction and written materials to support subject.  Instructor reviews cardiac drug classes: angiotensin converting enzyme inhibitors (ACE-I), angiotensin II receptor blockers (ARBs), nitrates, and calcium channel blockers.  Instructor discusses reasons, side effects, and lifestyle considerations for each drug class.   Anatomy and Physiology of the Circulatory System:  Group verbal and written instruction and models provide basic cardiac anatomy and physiology, with the coronary electrical and arterial systems. Review of: AMI, Angina, Valve disease, Heart Failure, Peripheral Artery Disease, Cardiac Arrhythmia, Pacemakers, and the ICD.   Other Education:  -Group or individual verbal, written, or video instructions that support the educational goals of the cardiac rehab program.   Knowledge Questionnaire Score:   Core Components/Risk Factors/Patient Goals at Admission:     Personal Goals and Risk Factors at Admission - 07/05/16 0910      Core Components/Risk Factors/Patient Goals on Admission   Personal Goal Other Yes   Personal Goal Have a better understanding of the healing process with cardiac surgery (AVR) and medication management   Intervention Provide cardiac education on disease process, medication management and risk factor modifications.    Expected Outcomes Pt  will have increased knowledge and awareness on cardiovascular disease and medication management      Core Components/Risk Factors/Patient Goals Review:      Goals and Risk Factor Review    Row Name 07/29/16 0748 07/29/16 0805 08/17/16 1529 09/26/16 1629       Core Components/Risk Factors/Patient Goals Review   Personal Goals Review Other  Have a better understanding of the healing process with cardiac surgery (AVR) and medication management  - Other Other    Review pt is actively participating in cardiac rehab activities without difficulty. pt educated on sun sensitivity from medication side effects.  pt informed to use sunscreen and wear protective clothing for sun exposure.   - pt is actively participating in cardiac rehab activities without difficulty. pt reports decreased fatigue and sun sensitivity. pt also notes increased confidence.   pt is actively participating in cardiac rehab activities without difficulty. pt reports decreased fatigue and sun sensitivity. pt also notes increased confidence.      Expected Outcomes  - Pt will have increased knowledge and awareness on cardiovascular disease and medication management  - Pt will have increased knowledge and awareness on cardiovascular disease and medication management       Core Components/Risk Factors/Patient Goals at Discharge (Final Review):      Goals and Risk Factor Review - 09/26/16 1629      Core Components/Risk Factors/Patient Goals Review   Personal Goals Review Other   Review pt is actively participating in cardiac rehab activities without difficulty. pt reports decreased fatigue and sun sensitivity. pt also notes increased confidence.     Expected Outcomes Pt will have increased knowledge and awareness on cardiovascular disease and medication management      ITP Comments:     ITP Comments    Row Name 07/05/16 0902 08/30/16 1642 09/26/16 1629       ITP Comments Medical Director, Dr. Armanda Magic Medical Director,  Dr. Armanda Magic Medical Director, Dr. Armanda Magic  Comments: Pt is making expected progress toward personal goals after completing 21 sessions. Recommend continued exercise and life style modification education including  stress management and relaxation techniques to decrease cardiac risk profile.

## 2016-09-28 ENCOUNTER — Encounter (HOSPITAL_COMMUNITY)
Admission: RE | Admit: 2016-09-28 | Discharge: 2016-09-28 | Disposition: A | Discharge status: Home / Self Care | Payer: BC Managed Care – PPO | Source: Ambulatory Visit | Attending: Cardiovascular Disease | Admitting: Cardiovascular Disease

## 2016-09-28 DIAGNOSIS — Z952 Presence of prosthetic heart valve: Secondary | ICD-10-CM

## 2016-09-29 ENCOUNTER — Telehealth (HOSPITAL_COMMUNITY): Payer: Self-pay

## 2016-09-29 NOTE — Telephone Encounter (Signed)
-----   Message from Tonny Bollman, MD sent at 09/29/2016  2:49 PM EDT ----- Regarding: RE: THR increase  Agree thanks ----- Message ----- From: Warrick Parisian D Sent: 09/27/2016   4:10 PM To: Tonny Bollman, MD Subject: THR increase                                   Patient has been in cardiac rehab approximately 11 weeks and is doing well. BP's WNL's, but HR's are beginning to exceed Target Heart Rate (THR) of 62-125 bpm (40%-80% of age predicted max HR). If no GXT is planned for the near future and MD agrees, request to increase THR to 40-90% of age predicted max HR 62-140bpm.     Thank you in advance!   Deyton Ellenbecker [employer]

## 2016-09-30 ENCOUNTER — Encounter (HOSPITAL_COMMUNITY)
Admission: RE | Admit: 2016-09-30 | Discharge: 2016-09-30 | Disposition: A | Discharge status: Home / Self Care | Payer: BC Managed Care – PPO | Source: Ambulatory Visit | Attending: Cardiovascular Disease | Admitting: Cardiovascular Disease

## 2016-09-30 DIAGNOSIS — Z952 Presence of prosthetic heart valve: Secondary | ICD-10-CM | POA: Diagnosis not present

## 2016-10-03 ENCOUNTER — Encounter (HOSPITAL_COMMUNITY): Payer: BC Managed Care – PPO

## 2016-10-05 ENCOUNTER — Encounter (HOSPITAL_COMMUNITY)
Admission: RE | Admit: 2016-10-05 | Discharge: 2016-10-05 | Disposition: A | Discharge status: Home / Self Care | Payer: BC Managed Care – PPO | Source: Ambulatory Visit | Attending: Cardiovascular Disease | Admitting: Cardiovascular Disease

## 2016-10-05 DIAGNOSIS — Z952 Presence of prosthetic heart valve: Secondary | ICD-10-CM

## 2016-10-05 NOTE — Progress Notes (Signed)
Reviewed home exercise with pt today.  Pt plans to walk and use small handheld weights for exercise.  Reviewed THR, pulse, RPE, sign and symptoms, and when to call 911 or MD.  Also discussed weather considerations and indoor options.  Pt voiced understanding.    Eligio Angert [employer]

## 2016-10-07 ENCOUNTER — Encounter (HOSPITAL_COMMUNITY)
Admission: RE | Admit: 2016-10-07 | Discharge: 2016-10-07 | Disposition: A | Discharge status: Home / Self Care | Payer: BC Managed Care – PPO | Source: Ambulatory Visit | Attending: Cardiovascular Disease | Admitting: Cardiovascular Disease

## 2016-10-07 DIAGNOSIS — Z952 Presence of prosthetic heart valve: Secondary | ICD-10-CM | POA: Diagnosis not present

## 2016-10-10 ENCOUNTER — Encounter (HOSPITAL_COMMUNITY)
Admission: RE | Admit: 2016-10-10 | Discharge: 2016-10-10 | Disposition: A | Discharge status: Home / Self Care | Payer: BC Managed Care – PPO | Source: Ambulatory Visit | Attending: Cardiovascular Disease | Admitting: Cardiovascular Disease

## 2016-10-10 DIAGNOSIS — Z952 Presence of prosthetic heart valve: Secondary | ICD-10-CM

## 2016-10-12 ENCOUNTER — Encounter (HOSPITAL_COMMUNITY): Payer: BC Managed Care – PPO

## 2016-10-14 ENCOUNTER — Encounter (HOSPITAL_COMMUNITY)
Admission: RE | Admit: 2016-10-14 | Discharge: 2016-10-14 | Disposition: A | Discharge status: Home / Self Care | Payer: BC Managed Care – PPO | Source: Ambulatory Visit | Attending: Cardiovascular Disease | Admitting: Cardiovascular Disease

## 2016-10-14 DIAGNOSIS — M1990 Unspecified osteoarthritis, unspecified site: Secondary | ICD-10-CM | POA: Diagnosis not present

## 2016-10-14 DIAGNOSIS — I5022 Chronic systolic (congestive) heart failure: Secondary | ICD-10-CM | POA: Diagnosis not present

## 2016-10-14 DIAGNOSIS — I481 Persistent atrial fibrillation: Secondary | ICD-10-CM | POA: Insufficient documentation

## 2016-10-14 DIAGNOSIS — Z952 Presence of prosthetic heart valve: Secondary | ICD-10-CM | POA: Diagnosis not present

## 2016-10-14 DIAGNOSIS — Z7901 Long term (current) use of anticoagulants: Secondary | ICD-10-CM | POA: Diagnosis not present

## 2016-10-14 DIAGNOSIS — F419 Anxiety disorder, unspecified: Secondary | ICD-10-CM | POA: Insufficient documentation

## 2016-10-14 DIAGNOSIS — F329 Major depressive disorder, single episode, unspecified: Secondary | ICD-10-CM | POA: Diagnosis not present

## 2016-10-14 DIAGNOSIS — Z7982 Long term (current) use of aspirin: Secondary | ICD-10-CM | POA: Diagnosis not present

## 2016-10-14 DIAGNOSIS — Z79899 Other long term (current) drug therapy: Secondary | ICD-10-CM | POA: Insufficient documentation

## 2016-10-17 ENCOUNTER — Encounter (HOSPITAL_COMMUNITY): Payer: BC Managed Care – PPO

## 2016-10-19 ENCOUNTER — Encounter (HOSPITAL_COMMUNITY): Payer: BC Managed Care – PPO

## 2016-10-21 ENCOUNTER — Encounter (HOSPITAL_COMMUNITY): Payer: BC Managed Care – PPO

## 2016-10-24 ENCOUNTER — Encounter (HOSPITAL_COMMUNITY)
Admission: RE | Admit: 2016-10-24 | Discharge: 2016-10-24 | Disposition: A | Discharge status: Home / Self Care | Payer: BC Managed Care – PPO | Source: Ambulatory Visit | Attending: Cardiovascular Disease | Admitting: Cardiovascular Disease

## 2016-10-24 DIAGNOSIS — Z952 Presence of prosthetic heart valve: Secondary | ICD-10-CM | POA: Diagnosis not present

## 2016-10-26 ENCOUNTER — Encounter (HOSPITAL_COMMUNITY)
Admission: RE | Admit: 2016-10-26 | Discharge: 2016-10-26 | Disposition: A | Discharge status: Home / Self Care | Payer: BC Managed Care – PPO | Source: Ambulatory Visit | Attending: Cardiovascular Disease | Admitting: Cardiovascular Disease

## 2016-10-26 DIAGNOSIS — Z952 Presence of prosthetic heart valve: Secondary | ICD-10-CM

## 2016-10-27 NOTE — Progress Notes (Signed)
Cardiac Individual Treatment Plan  Patient Details  Name: ROSAMUND NYLAND MRN: [id number] Date of Birth: [birth year] Referring Provider:     CARDIAC REHAB PHASE II ORIENTATION from 07/05/2016 in [facility] CARDIAC [facility]  Referring Provider  Tonny Bollman, MD.      Initial Encounter Date:    CARDIAC REHAB PHASE II ORIENTATION from 07/05/2016 in [facility] CARDIAC REHAB  Date  07/05/16  Referring Provider  Tonny Bollman, MD.      Visit Diagnosis: 05/03/16 S/P AVR (aortic valve replacement)  Patient's Home Medications on Admission:  Current Outpatient Prescriptions:  .  amphetamine-dextroamphetamine (ADDERALL) 20 MG tablet, Take 20 mg by mouth daily. Reported on 08/18/2015, Disp: , Rfl:  .  aspirin EC 325 MG tablet, Take 1 tablet (325 mg total) by mouth daily., Disp: 30 tablet, Rfl: 0 .  LORazepam (ATIVAN) 0.5 MG tablet, Take 0.5 mg by mouth daily as needed for anxiety. , Disp: , Rfl:  .  metoprolol succinate (TOPROL-XL) 25 MG 24 hr tablet, Take 1 tablet (25 mg total) by mouth at bedtime., Disp: 90 tablet, Rfl: 3 .  rizatriptan (MAXALT-MLT) 10 MG disintegrating tablet, Take 1 tablet (10 mg total) by mouth once as needed for migraine. May repeat in 2 hours if needed, Disp: 10 tablet, Rfl: 1  Past Medical History: Past Medical History:  Diagnosis Date  . Anxiety   . Arthritis   . Chronic systolic heart failure (HCC) 05/25/2016  . Depression   . Migraine   . Persistent atrial fibrillation (HCC) 05/13/2016   Post AVR // Amiodarone // Coumadin started; possible duration 6-8 weeks  . PVC's (premature ventricular contractions) 05/25/2016   Holter 11/17 The basic rhythm is sinus. There are frequent PVCs, periods of ventricular bigeminy, and occasional ventricular couplets and triplets.  . S/P AVR (aortic valve replacement)    Echo 11/17:  EF 40-45, mild diff HK, Gr 2 DD, possibly bicuspid AV with very mild AS and severe AI, mild MR, mild LAE // R/L [facility] 1/18: LAD  irregs prox 20, Normal R heart hemodynamics // Intraop TEE 05/03/16:  EF 35-40, severe AI, mild MR  . S/P AVR (aortic valve replacement)    2/2 aortic insufficiency // Echo 11/17:  EF 40-45, mild diff HK, Gr 2 DD, possibly bicuspid AV with very mild AS and severe AI, mild MR, mild LAE // R/L [facility] 1/18: LAD irregs prox 20, Normal R heart hemodynamics // Intraop TEE 05/03/16:  EF 35-40, severe AI, mild MR    Tobacco Use: History  Smoking Status  . Former Smoker  Smokeless Tobacco  . Never Used    Comment: in college    Labs: Recent Hydrographic surveyor    Labs for ITP Cardiac and Pulmonary Rehab Latest Ref Rng & Units 05/03/2016 05/04/2016 05/05/2016 05/06/2016 05/07/2016   Cholestrol 125 - 200 mg/dL - - - - -   LDLCALC <409 mg/dL - - - - -   HDL >=81 mg/dL - - - - -   Trlycerides <150 mg/dL - - - - -   Hemoglobin A1c 4.8 - 5.6 % - - - - -   PHART 7.350 - 7.450 - - - - -   PCO2ART 32.0 - 48.0 mmHg - - - - -   HCO3 20.0 - 28.0 mmol/L - - - - -   TCO2 0 - 100 mmol/L 21 23 - - -   ACIDBASEDEF 0.0 - 2.0 mmol/L - - - - -  O2SAT % - 80.1 80.7 84.1 67.0      Capillary Blood Glucose: Lab Results  Component Value Date   GLUCAP 91 05/06/2016   GLUCAP 90 05/06/2016   GLUCAP 62 (L) 05/06/2016   GLUCAP 128 (H) 05/06/2016   GLUCAP 98 05/06/2016     Exercise Target Goals:    Exercise Program Goal: Individual exercise prescription set with THRR, safety & activity barriers. Participant demonstrates ability to understand and report RPE using BORG scale, to self-measure pulse accurately, and to acknowledge the importance of the exercise prescription.  Exercise Prescription Goal: Starting with aerobic activity 30 plus minutes a day, 3 days per week for initial exercise prescription. Provide home exercise prescription and guidelines that participant acknowledges understanding prior to discharge.  Activity Barriers & Risk Stratification:     Activity Barriers & Cardiac Risk Stratification -  07/05/16 0905      Activity Barriers & Cardiac Risk Stratification   Activity Barriers Deconditioning;Muscular Weakness;Shortness of Breath   Cardiac Risk Stratification High      6 Minute Walk:     6 Minute Walk    Row Name 07/05/16 1437         6 Minute Walk   Phase Initial     Distance 1682 feet     Walk Time 6 minutes     # of Rest Breaks 0     MPH 3.19     METS 3.91     RPE 9     VO2 Peak 13.68     Symptoms No     Resting HR 68 bpm     Resting BP 110/76     Max Ex. HR 93 bpm     Max Ex. BP 122/70     2 Minute Post BP 114/72        Oxygen Initial Assessment:   Oxygen Re-Evaluation:   Oxygen Discharge (Final Oxygen Re-Evaluation):   Initial Exercise Prescription:     Initial Exercise Prescription - 07/05/16 1400      Date of Initial Exercise RX and Referring Provider   Date 07/05/16   Referring Provider Tonny Bollman, MD.     Treadmill   MPH 2.3   Grade 1   Minutes 10   METs 3.08     Bike   Level 1   Minutes 10   METs 3.9     NuStep   Level 3   SPM 85   Minutes 10   METs 2.8     Prescription Details   Frequency (times per week) 3   Duration Progress to 30 minutes of continuous aerobic without signs/symptoms of physical distress     Intensity   THRR 40-80% of Max Heartrate 62-125   Ratings of Perceived Exertion 11-13   Perceived Dyspnea 0-4     Progression   Progression Continue to progress workloads to maintain intensity without signs/symptoms of physical distress.     Resistance Training   Training Prescription Yes   Weight 2lbs.   Reps 10-15      Perform Capillary Blood Glucose checks as needed.  Exercise Prescription Changes:     Exercise Prescription Changes    Row Name 07/20/16 1100 08/02/16 1400 08/16/16 1600 09/07/16 1356 09/27/16 1300     Response to Exercise   Blood Pressure (Admit) 104/60 100/68 124/80 116/70 114/60   Blood Pressure (Exercise) 136/82 136/70 132/70 130/80 142/82   Blood Pressure (Exit)  106/74 106/60 116/86 118/82 120/60   Heart Rate (Admit) 88  bpm 79 bpm 78 bpm 84 bpm 79 bpm   Heart Rate (Exercise) 133 bpm 119 bpm 129 bpm 141 bpm 125 bpm   Heart Rate (Exit) 88 bpm 85 bpm 76 bpm 89 bpm 82 bpm   Rating of Perceived Exertion (Exercise) 12 12 12 13 12    Symptoms none none none none none   Comments pt was oriented to exercise equipment on 07/11/16 pt was oriented to exercise equipment on 07/11/16 pt was oriented to exercise equipment on 07/11/16  -  -   Duration Continue with 30 min of aerobic exercise without signs/symptoms of physical distress. Continue with 30 min of aerobic exercise without signs/symptoms of physical distress. Continue with 30 min of aerobic exercise without signs/symptoms of physical distress. Continue with 30 min of aerobic exercise without signs/symptoms of physical distress. Continue with 30 min of aerobic exercise without signs/symptoms of physical distress.   Intensity THRR unchanged THRR unchanged THRR unchanged THRR unchanged THRR unchanged     Progression   Progression Continue to progress workloads to maintain intensity without signs/symptoms of physical distress. Continue to progress workloads to maintain intensity without signs/symptoms of physical distress. Continue to progress workloads to maintain intensity without signs/symptoms of physical distress. Continue to progress workloads to maintain intensity without signs/symptoms of physical distress. Continue to progress workloads to maintain intensity without signs/symptoms of physical distress.   Average METs 3.5 3.9 4.1 5 5      Resistance Training   Training Prescription Yes Yes Yes Yes Yes   Weight 4lbs 4lbs 4lbs 5lbs 5lbs   Reps 10-15 10-15 10-15 10-15 10-15   Time 10 Minutes 10 Minutes 10 Minutes 10 Minutes 10 Minutes     Treadmill   MPH 2.6 3.2 3.2 3.5 3.7   Grade 2 2 2 2 3    Minutes 10 15 15 15 15    METs 3.71 4.33 4.33 4.65 5.36     Bike   Level 1  - 1 1.5  -   Minutes 10  - 15 15  -    METs 3.9  - 3.9 5.31  -     NuStep   Level 3 4  -  - 5   SPM 85 90  -  - 90   Minutes 10 15  -  - 15   METs 2.9 3.5  -  - 4.6   Row Name 10/10/16 1649 10/26/16 1600           Response to Exercise   Blood Pressure (Admit) 122/80 110/60      Blood Pressure (Exercise) 130/68 118/70      Blood Pressure (Exit) 109/74 98/60      Heart Rate (Admit) 75 bpm 84 bpm      Heart Rate (Exercise) 125 bpm 133 bpm      Heart Rate (Exit) 67 bpm 84 bpm      Rating of Perceived Exertion (Exercise) 12 13      Symptoms none none      Duration Continue with 30 min of aerobic exercise without signs/symptoms of physical distress. Continue with 30 min of aerobic exercise without signs/symptoms of physical distress.      Intensity THRR unchanged THRR unchanged        Progression   Progression Continue to progress workloads to maintain intensity without signs/symptoms of physical distress. Continue to progress workloads to maintain intensity without signs/symptoms of physical distress.      Average METs 5.1 5.7  Resistance Training   Training Prescription Yes Yes      Weight 5lbs 5lbs      Reps 10-15 10-15      Time 10 Minutes 10 Minutes        Treadmill   MPH 3.8 3.8      Grade 3 3      Minutes 15 15      METs 5.48 5.48        NuStep   Level  - 5      SPM  - 90      Minutes  - 15      METs  - 4.6        Rower   Level 2 3      Watts 24 41      Minutes 15 15      METs 4.8 6        Home Exercise Plan   Plans to continue exercise at Home (comment)  walking and small handheld weights Home (comment)  walking and small handheld weights      Frequency Add 2 additional days to program exercise sessions. Add 2 additional days to program exercise sessions.      Initial Home Exercises Provided 10/05/16 10/05/16         Exercise Comments:     Exercise Comments    Row Name 08/02/16 1437 08/29/16 1557 09/27/16 1358 10/26/16 1652     Exercise Comments Reviewed METs and goals, Pt is  tolerating exercise well; will continue to monitor exercise progression. Reviewed METs and goals, Pt is tolerating exercise well; will continue to monitor exercise progression. Reviewed METs and goals, Pt is tolerating exercise well; will continue to monitor exercise progression. Reviewed METs and goals, Pt is tolerating exercise well; will continue to monitor exercise progression.       Exercise Goals and Review:     Exercise Goals    Row Name 07/05/16 0908             Exercise Goals   Increase Physical Activity Yes       Intervention Provide advice, education, support and counseling about physical activity/exercise needs.;Develop an individualized exercise prescription for aerobic and resistive training based on initial evaluation findings, risk stratification, comorbidities and participant's personal goals.       Expected Outcomes Achievement of increased cardiorespiratory fitness and enhanced flexibility, muscular endurance and strength shown through measurements of functional capacity and personal statement of participant.       Increase Strength and Stamina Yes       Intervention Provide advice, education, support and counseling about physical activity/exercise needs.;Develop an individualized exercise prescription for aerobic and resistive training based on initial evaluation findings, risk stratification, comorbidities and participant's personal goals.       Expected Outcomes Achievement of increased cardiorespiratory fitness and enhanced flexibility, muscular endurance and strength shown through measurements of functional capacity and personal statement of participant.          Exercise Goals Re-Evaluation :     Exercise Goals Re-Evaluation    Row Name 08/02/16 1437 08/17/16 1528 09/27/16 1358 10/05/16 1449 10/26/16 1651     Exercise Goal Re-Evaluation   Exercise Goals Review Increase Physical Activity;Increase Strenth and Stamina Increase Physical Activity;Increase Strenth  and Stamina Increase Physical Activity;Increase Strenth and Stamina Increase Physical Activity;Increase Strenth and Stamina  -   Comments Pt stated "feeling good and able to do yardwork without extreme fatigue/exhaustion" pt reports decreased fatigue and increased confidence in  functional ability.  pt is walking at home on non CR days. Pt is very compliant with HEP and works very hard in cardiac rehab. Pt stated  "having an increase in confidence with exercise due to cardiac rehab" Reviewed home exercise with pt today.  Pt plans to walk and use small handheld weights for exercise.  Reviewed THR, pulse, RPE, sign and symptoms, and when to call 911 or MD.  Also discussed weather considerations and indoor options.  Pt voiced understanding. Pt is making great progress in cardiac rehab and has noticed an increase in UE strength since using the rower machine.    Expected Outcomes Pt will continue to improve in aerobic and functional fitness. Pt will continue to improve in aerobic and functional fitness. Pt will continue to improve in aerobic and functional fitness. Pt will continue to improve in aerobic and functional fitness. Pt will continue to improve in aerobic and functional fitness.       Discharge Exercise Prescription (Final Exercise Prescription Changes):     Exercise Prescription Changes - 10/26/16 1600      Response to Exercise   Blood Pressure (Admit) 110/60   Blood Pressure (Exercise) 118/70   Blood Pressure (Exit) 98/60   Heart Rate (Admit) 84 bpm   Heart Rate (Exercise) 133 bpm   Heart Rate (Exit) 84 bpm   Rating of Perceived Exertion (Exercise) 13   Symptoms none   Duration Continue with 30 min of aerobic exercise without signs/symptoms of physical distress.   Intensity THRR unchanged     Progression   Progression Continue to progress workloads to maintain intensity without signs/symptoms of physical distress.   Average METs 5.7     Resistance Training   Training Prescription  Yes   Weight 5lbs   Reps 10-15   Time 10 Minutes     Treadmill   MPH 3.8   Grade 3   Minutes 15   METs 5.48     NuStep   Level 5   SPM 90   Minutes 15   METs 4.6     Rower   Level 3   Watts 41   Minutes 15   METs 6     Home Exercise Plan   Plans to continue exercise at Home (comment)  walking and small handheld weights   Frequency Add 2 additional days to program exercise sessions.   Initial Home Exercises Provided 10/05/16      Nutrition:  Target Goals: Understanding of nutrition guidelines, daily intake of sodium 1500mg , cholesterol 200mg , calories 30% from fat and 7% or less from saturated fats, daily to have 5 or more servings of fruits and vegetables.  Biometrics:     Pre Biometrics - 07/05/16 1439      Pre Biometrics   Height 5\' 5"  (1.651 m)   Weight 145 lb 4.5 oz (65.9 kg)   Waist Circumference 29 inches   Hip Circumference 39 inches   Waist to Hip Ratio 0.74 %   BMI (Calculated) 24.2   Triceps Skinfold 33 mm   % Body Fat 35.5 %   Grip Strength 32 kg   Flexibility 15 in   Single Leg Stand 30 seconds       Nutrition Therapy Plan and Nutrition Goals:     Nutrition Therapy & Goals - 08/17/16 1432      Nutrition Therapy   Diet Low Sodium     Personal Nutrition Goals   Nutrition Goal Wt loss of 1-2 lb/week to a  wt loss goal of 6-10 lb at graduation from Cardiac Rehab.     Intervention Plan   Intervention Prescribe, educate and counsel regarding individualized specific dietary modifications aiming towards targeted core components such as weight, hypertension, lipid management, diabetes, heart failure and other comorbidities.   Expected Outcomes Short Term Goal: Understand basic principles of dietary content, such as calories, fat, sodium, cholesterol and nutrients.;Long Term Goal: Adherence to prescribed nutrition plan.      Nutrition Discharge: Nutrition Scores:   Nutrition Goals Re-Evaluation:   Nutrition Goals  Re-Evaluation:   Nutrition Goals Discharge (Final Nutrition Goals Re-Evaluation):   Psychosocial: Target Goals: Acknowledge presence or absence of significant depression and/or stress, maximize coping skills, provide positive support system. Participant is able to verbalize types and ability to use techniques and skills needed for reducing stress and depression.  Initial Review & Psychosocial Screening:     Initial Psych Review & Screening - 07/05/16 1613      Initial Review   Current issues with None Identified     Family Dynamics   Good Support System? Yes   Comments upon initial assessment no psychosocial needs identified, no intervetions necessary      Barriers   Psychosocial barriers to participate in program There are no identifiable barriers or psychosocial needs.     Screening Interventions   Interventions Encouraged to exercise;Provide feedback about the scores to participant      Quality of Life Scores:     Quality of Life - 10/10/16 1636      Quality of Life Scores   Health/Function Pre 25.63 %   Socioeconomic Pre 26.43 %   Psych/Spiritual Pre 27 %   Family Pre 30 %   GLOBAL Pre 26.72 %  overall scores good. pt exhibits health related anxiety from recent cardiac event.  pt biggest concerns are continued symptoms, worries and changes related chronic illness. However, pt has positive outlook with good coping skills.       PHQ-9: Recent Review Flowsheet Data    Depression screen [facility] 2/9 07/11/2016   Decreased Interest 0   Down, Depressed, Hopeless 1   PHQ - 2 Score 1     Interpretation of Total Score  Total Score Depression Severity:  1-4 = Minimal depression, 5-9 = Mild depression, 10-14 = Moderate depression, 15-19 = Moderately severe depression, 20-27 = Severe depression   Psychosocial Evaluation and Intervention:     Psychosocial Evaluation - 07/11/16 1636      Psychosocial Evaluation & Interventions   Interventions Encouraged to exercise with  the program and follow exercise prescription;Stress management education;Relaxation education   Comments pt exhibits health related anxiety from recent cardiac event.  GI distress and fatigue from medication side effects have been discouraging to her. pt is looking forward to resuming previous level of activity and achieving full recovery.    Expected Outcomes pt will demonstrate positive outlook with good coping skills.   Continue Psychosocial Services  No Follow up required      Psychosocial Re-Evaluation:     Psychosocial Re-Evaluation    Row Name 07/29/16 0745 08/17/16 1530 09/26/16 1629 10/27/16 1037       Psychosocial Re-Evaluation   Current issues with Current Stress Concerns Current Stress Concerns Current Stress Concerns Current Stress Concerns    Comments pt with health related anxiety is adjusting well to cardiac rehab routine and has been able to incorporate more activity at home without difficulty.   pt with health related anxiety is adjusting well to  cardiac rehab routine and has been able to incorporate more activity at home without difficulty.   pt with health related anxiety is adjusting well to cardiac rehab routine and has been able to incorporate more activity at home without difficulty.   pt with health related anxiety is adjusting well to cardiac rehab routine and has been able to incorporate more activity at home without difficulty.      Expected Outcomes pt will exhibit hopeful, positive outlook with good coping skills.  pt will exhibit hopeful, positive outlook with good coping skills.  pt will exhibit hopeful, positive outlook with good coping skills.  pt will exhibit hopeful, positive outlook with good coping skills.     Interventions Encouraged to attend Cardiac Rehabilitation for the exercise;Stress management education Encouraged to attend Cardiac Rehabilitation for the exercise;Stress management education Encouraged to attend Cardiac Rehabilitation for the  exercise;Stress management education Encouraged to attend Cardiac Rehabilitation for the exercise;Stress management education    Continue Psychosocial Services  Follow up required by staff Follow up required by staff Follow up required by staff Follow up required by staff       Psychosocial Discharge (Final Psychosocial Re-Evaluation):     Psychosocial Re-Evaluation - 10/27/16 1037      Psychosocial Re-Evaluation   Current issues with Current Stress Concerns   Comments pt with health related anxiety is adjusting well to cardiac rehab routine and has been able to incorporate more activity at home without difficulty.     Expected Outcomes pt will exhibit hopeful, positive outlook with good coping skills.    Interventions Encouraged to attend Cardiac Rehabilitation for the exercise;Stress management education   Continue Psychosocial Services  Follow up required by staff      Vocational Rehabilitation: Provide vocational rehab assistance to qualifying candidates.   Vocational Rehab Evaluation & Intervention:     Vocational Rehab - 07/05/16 1609      Initial Vocational Rehab Evaluation & Intervention   Assessment shows need for Vocational Rehabilitation --  pt did not return homework packet       Education: Education Goals: Education classes will be provided on a weekly basis, covering required topics. Participant will state understanding/return demonstration of topics presented.  Learning Barriers/Preferences:     Learning Barriers/Preferences - 07/05/16 0907      Learning Barriers/Preferences   Learning Preferences Pictoral;Video;Written Material;Computer/Internet      Education Topics: Count Your Pulse:  -Group instruction provided by verbal instruction, demonstration, patient participation and written materials to support subject.  Instructors address importance of being able to find your pulse and how to count your pulse when at home without a heart monitor.  Patients  get hands on experience counting their pulse with staff help and individually.   CARDIAC REHAB PHASE II EXERCISE from 10/07/2016 in [facility] CARDIAC REHAB  Date  07/15/16  Instruction Review Code  2- meets goals/outcomes      Heart Attack, Angina, and Risk Factor Modification:  -Group instruction provided by verbal instruction, video, and written materials to support subject.  Instructors address signs and symptoms of angina and heart attacks.    Also discuss risk factors for heart disease and how to make changes to improve heart health risk factors.   Functional Fitness:  -Group instruction provided by verbal instruction, demonstration, patient participation, and written materials to support subject.  Instructors address safety measures for doing things around the house.  Discuss how to get up and down off the floor, how to pick  things up properly, how to safely get out of a chair without assistance, and balance training.   CARDIAC REHAB PHASE II EXERCISE from 10/07/2016 in [facility] CARDIAC REHAB  Date  07/29/16  Educator  Cristy Hilts  Instruction Review Code  2- meets goals/outcomes      Meditation and Mindfulness:  -Group instruction provided by verbal instruction, patient participation, and written materials to support subject.  Instructor addresses importance of mindfulness and meditation practice to help reduce stress and improve awareness.  Instructor also leads participants through a meditation exercise.    Stretching for Flexibility and Mobility:  -Group instruction provided by verbal instruction, patient participation, and written materials to support subject.  Instructors lead participants through series of stretches that are designed to increase flexibility thus improving mobility.  These stretches are additional exercise for major muscle groups that are typically performed during regular warm up and cool down.   CARDIAC REHAB PHASE II  EXERCISE from 10/07/2016 in [facility] CARDIAC REHAB  Date  10/07/16  Instruction Review Code  2- meets goals/outcomes      Hands Only CPR:  -Group verbal, video, and participation provides a basic overview of AHA guidelines for community CPR. Role-play of emergencies allow participants the opportunity to practice calling for help and chest compression technique with discussion of AED use.   Hypertension: -Group verbal and written instruction that provides a basic overview of hypertension including the most recent diagnostic guidelines, risk factor reduction with self-care instructions and medication management.    Nutrition I class: Heart Healthy Eating:  -Group instruction provided by PowerPoint slides, verbal discussion, and written materials to support subject matter. The instructor gives an explanation and review of the Therapeutic Lifestyle Changes diet recommendations, which includes a discussion on lipid goals, dietary fat, sodium, fiber, plant stanol/sterol esters, sugar, and the components of a well-balanced, healthy diet.   CARDIAC REHAB PHASE II EXERCISE from 10/07/2016 in [facility] CARDIAC REHAB  Date  08/17/16  Educator  RD  Instruction Review Code  Not applicable [class handouts given]      Nutrition II class: Lifestyle Skills:  -Group instruction provided by PowerPoint slides, verbal discussion, and written materials to support subject matter. The instructor gives an explanation and review of label reading, grocery shopping for heart health, heart healthy recipe modifications, and ways to make healthier choices when eating out.   CARDIAC REHAB PHASE II EXERCISE from 10/07/2016 in [facility] CARDIAC REHAB  Date  08/17/16  Educator  RD  Instruction Review Code  Not applicable [class handouts given]      Diabetes Question & Answer:  -Group instruction provided by PowerPoint slides, verbal discussion, and written  materials to support subject matter. The instructor gives an explanation and review of diabetes co-morbidities, pre- and post-prandial blood glucose goals, pre-exercise blood glucose goals, signs, symptoms, and treatment of hypoglycemia and hyperglycemia, and foot care basics.   Diabetes Blitz:  -Group instruction provided by PowerPoint slides, verbal discussion, and written materials to support subject matter. The instructor gives an explanation and review of the physiology behind type 1 and type 2 diabetes, diabetes medications and rational behind using different medications, pre- and post-prandial blood glucose recommendations and Hemoglobin A1c goals, diabetes diet, and exercise including blood glucose guidelines for exercising safely.    Portion Distortion:  -Group instruction provided by PowerPoint slides, verbal discussion, written materials, and food models to support subject matter. The instructor gives an explanation of serving  size versus portion size, changes in portions sizes over the last 20 years, and what consists of a serving from each food group.   Stress Management:  -Group instruction provided by verbal instruction, video, and written materials to support subject matter.  Instructors review role of stress in heart disease and how to cope with stress positively.     CARDIAC REHAB PHASE II EXERCISE from 10/07/2016 in [facility] CARDIAC REHAB  Date  07/13/16  Instruction Review Code  2- meets goals/outcomes      Exercising on Your Own:  -Group instruction provided by verbal instruction, power point, and written materials to support subject.  Instructors discuss benefits of exercise, components of exercise, frequency and intensity of exercise, and end points for exercise.  Also discuss use of nitroglycerin and activating EMS.  Review options of places to exercise outside of rehab.  Review guidelines for sex with heart disease.   Cardiac Drugs I:  -Group  instruction provided by verbal instruction and written materials to support subject.  Instructor reviews cardiac drug classes: antiplatelets, anticoagulants, beta blockers, and statins.  Instructor discusses reasons, side effects, and lifestyle considerations for each drug class.   Cardiac Drugs II:  -Group instruction provided by verbal instruction and written materials to support subject.  Instructor reviews cardiac drug classes: angiotensin converting enzyme inhibitors (ACE-I), angiotensin II receptor blockers (ARBs), nitrates, and calcium channel blockers.  Instructor discusses reasons, side effects, and lifestyle considerations for each drug class.   Anatomy and Physiology of the Circulatory System:  Group verbal and written instruction and models provide basic cardiac anatomy and physiology, with the coronary electrical and arterial systems. Review of: AMI, Angina, Valve disease, Heart Failure, Peripheral Artery Disease, Cardiac Arrhythmia, Pacemakers, and the ICD.   Other Education:  -Group or individual verbal, written, or video instructions that support the educational goals of the cardiac rehab program.   Knowledge Questionnaire Score:     Knowledge Questionnaire Score - 10/05/16 1618      Knowledge Questionnaire Score   Pre Score 24/24      Core Components/Risk Factors/Patient Goals at Admission:     Personal Goals and Risk Factors at Admission - 07/05/16 0910      Core Components/Risk Factors/Patient Goals on Admission   Personal Goal Other Yes   Personal Goal Have a better understanding of the healing process with cardiac surgery (AVR) and medication management   Intervention Provide cardiac education on disease process, medication management and risk factor modifications.    Expected Outcomes Pt will have increased knowledge and awareness on cardiovascular disease and medication management      Core Components/Risk Factors/Patient Goals Review:      Goals and  Risk Factor Review    Row Name 07/29/16 0748 07/29/16 0805 08/17/16 1529 09/26/16 1629 10/27/16 1036     Core Components/Risk Factors/Patient Goals Review   Personal Goals Review Other  Have a better understanding of the healing process with cardiac surgery (AVR) and medication management  - Other Other Other   Review pt is actively participating in cardiac rehab activities without difficulty. pt educated on sun sensitivity from medication side effects.  pt informed to use sunscreen and wear protective clothing for sun exposure.   - pt is actively participating in cardiac rehab activities without difficulty. pt reports decreased fatigue and sun sensitivity. pt also notes increased confidence.   pt is actively participating in cardiac rehab activities without difficulty. pt reports decreased fatigue and sun sensitivity. pt also notes  increased confidence.   pt is actively participating in cardiac rehab activities without difficulty. pt displays increased self confidence.    Expected Outcomes  - Pt will have increased knowledge and awareness on cardiovascular disease and medication management  - Pt will have increased knowledge and awareness on cardiovascular disease and medication management Pt will have increased knowledge and awareness on cardiovascular disease and medication management      Core Components/Risk Factors/Patient Goals at Discharge (Final Review):      Goals and Risk Factor Review - 10/27/16 1036      Core Components/Risk Factors/Patient Goals Review   Personal Goals Review Other   Review pt is actively participating in cardiac rehab activities without difficulty. pt displays increased self confidence.    Expected Outcomes Pt will have increased knowledge and awareness on cardiovascular disease and medication management      ITP Comments:     ITP Comments    Row Name 07/05/16 0902 08/30/16 1642 09/26/16 1629 10/27/16 1036     ITP Comments Medical Director, Dr. Armanda Magic Medical Director, Dr. Armanda Magic Medical Director, Dr. Armanda Magic Medical Director, Dr. Armanda Magic       Comments: Pt is making expected progress toward personal goals after completing 27 sessions. Recommend continued exercise and life style modification education including  stress management and relaxation techniques to decrease cardiac risk profile.

## 2016-10-28 ENCOUNTER — Encounter (HOSPITAL_COMMUNITY)
Admission: RE | Admit: 2016-10-28 | Discharge: 2016-10-28 | Disposition: A | Discharge status: Home / Self Care | Payer: BC Managed Care – PPO | Source: Ambulatory Visit | Attending: Cardiovascular Disease | Admitting: Cardiovascular Disease

## 2016-10-28 DIAGNOSIS — Z952 Presence of prosthetic heart valve: Secondary | ICD-10-CM | POA: Diagnosis not present

## 2016-10-31 ENCOUNTER — Encounter (HOSPITAL_COMMUNITY)
Admission: RE | Admit: 2016-10-31 | Discharge: 2016-10-31 | Disposition: A | Discharge status: Home / Self Care | Payer: BC Managed Care – PPO | Source: Ambulatory Visit | Attending: Cardiovascular Disease | Admitting: Cardiovascular Disease

## 2016-10-31 DIAGNOSIS — Z952 Presence of prosthetic heart valve: Secondary | ICD-10-CM | POA: Diagnosis not present

## 2016-10-31 NOTE — Progress Notes (Signed)
Daily Session Note  Patient Details  Name: NABIHA PLANCK MRN: [id number] Date of Birth: 07/19/1950 Referring Provider:     CARDIAC REHAB PHASE II ORIENTATION from 07/05/2016 in [place]  Referring Provider  Sherren Mocha, MD.      Encounter Date: 10/31/2016  Check In:     Session Check In - 10/31/16 1416      Check-In   Location [facility]   Staff Present Andi Hence, RN, Marga Melnick, RN, [facility], MS, ACSM RCEP, Exercise Physiologist;Olinty Celesta Aver, MS, ACSM CEP, Exercise Physiologist;Other   Supervising physician immediately available to respond to emergencies [facility] immediately available   Physician(s) Dr. Wendee Beavers   Medication changes reported     No   Fall or balance concerns reported    No   Tobacco Cessation No Change   Warm-up and Cool-down Performed as group-led instruction   Resistance Training Performed Yes   VAD Patient? No     Pain Assessment   Currently in Pain? No/denies      Capillary Blood Glucose: No results found for this or any previous visit (from the past 24 hour(s)).    History  Smoking Status  . Former Smoker  Smokeless Tobacco  . Never Used    Comment: in college    Goals Met:  Exercise tolerated well  Goals Unmet:  Not Applicable  Comments: pt heart rate faster than usual at cardiac rehab. Telemetry- sinus tachycardia.Peak HR-153, BP:  168/80. Resting HR:  95.    Pt denies change to routine.  However pt did rush to get to scheduled exercise time. Pt assigned exercise prescription today included stationary bike which historically is more difficult for pt.  Pt c/o DOE more than usual. Pt also notes it has been several weeks since she has participated in [place].  Will continue to monitor.      Dr. Fransico Him is Medical Director for Cardiac Rehab at [facility].

## 2016-11-02 ENCOUNTER — Encounter (HOSPITAL_COMMUNITY)
Admission: RE | Admit: 2016-11-02 | Discharge: 2016-11-02 | Disposition: A | Discharge status: Home / Self Care | Payer: BC Managed Care – PPO | Source: Ambulatory Visit | Attending: Cardiovascular Disease | Admitting: Cardiovascular Disease

## 2016-11-02 DIAGNOSIS — Z952 Presence of prosthetic heart valve: Secondary | ICD-10-CM | POA: Diagnosis not present

## 2016-11-04 ENCOUNTER — Encounter (HOSPITAL_COMMUNITY): Payer: BC Managed Care – PPO

## 2016-11-07 ENCOUNTER — Encounter (HOSPITAL_COMMUNITY): Payer: Self-pay

## 2016-11-07 ENCOUNTER — Encounter (HOSPITAL_COMMUNITY)
Admission: RE | Admit: 2016-11-07 | Discharge: 2016-11-07 | Disposition: A | Discharge status: Home / Self Care | Payer: BC Managed Care – PPO | Source: Ambulatory Visit | Attending: Cardiovascular Disease | Admitting: Cardiovascular Disease

## 2016-11-07 VITALS — Ht 65.0 in | Wt 145.5 lb

## 2016-11-07 DIAGNOSIS — Z952 Presence of prosthetic heart valve: Secondary | ICD-10-CM

## 2016-11-23 ENCOUNTER — Ambulatory Visit (INDEPENDENT_AMBULATORY_CARE_PROVIDER_SITE_OTHER): Payer: BC Managed Care – PPO | Admitting: Obstetrics & Gynecology

## 2016-11-23 ENCOUNTER — Other Ambulatory Visit (HOSPITAL_COMMUNITY)
Admission: RE | Admit: 2016-11-23 | Discharge: 2016-11-23 | Disposition: A | Discharge status: Home / Self Care | Payer: BC Managed Care – PPO | Source: Ambulatory Visit | Attending: Obstetrics & Gynecology | Admitting: Obstetrics & Gynecology

## 2016-11-23 ENCOUNTER — Encounter: Payer: Self-pay | Admitting: Obstetrics & Gynecology

## 2016-11-23 VITALS — BP 112/70 | HR 80 | Resp 16 | Ht 64.5 in | Wt 146.0 lb

## 2016-11-23 DIAGNOSIS — Z124 Encounter for screening for malignant neoplasm of cervix: Secondary | ICD-10-CM | POA: Diagnosis not present

## 2016-11-23 DIAGNOSIS — Z01419 Encounter for gynecological examination (general) (routine) without abnormal findings: Secondary | ICD-10-CM

## 2016-11-23 MED ORDER — RIZATRIPTAN BENZOATE 10 MG PO TBDP: 10.0000 mg | ORAL_TABLET | Freq: Once | ORAL | 2 refills | Status: DC | PRN

## 2016-11-23 NOTE — Progress Notes (Addendum)
66 y.o. G3P2 MarriedCaucasianF here for annual exam.  Doing well.  Had aortic valve replacement 2/18.  On coumadin and amiodarone.  Off all of these now.  Just on ASA.  Finished cardiac rehab.    Denies vaginal bleeding.  Patient's last menstrual period was 08/12/2004.          Sexually active: Yes.    The current method of family planning is post menopausal status.    Exercising: Yes.    walking, cardio, and strengthening class Smoker:  no  Health Maintenance: Pap:  06/09/14 Neg  04/2012 Neg. HR HPV:neg  History of abnormal Pap:  yes MMG:  06/20/13 [place] right  BIRADS2:Benign. Screening MMG due Colonoscopy:  Never BMD:   06/13/13 Osteopenia  TDaP:  05/2013  Pneumonia vaccine(s):  never Zostavax:   never Hep C testing: 08/18/15 Neg  Screening Labs: discuss today   reports that she has quit smoking. She has never used smokeless tobacco. She reports that she drinks alcohol. She reports that she does not use drugs.  Past Medical History:  Diagnosis Date  . Anxiety   . Arthritis   . Chronic systolic heart failure (HCC) 05/25/2016  . Depression   . Migraine   . Persistent atrial fibrillation (HCC) 05/13/2016   Post AVR // Amiodarone // Coumadin started; possible duration 6-8 weeks  . PVC's (premature ventricular contractions) 05/25/2016   Holter 11/17 The basic rhythm is sinus. There are frequent PVCs, periods of ventricular bigeminy, and occasional ventricular couplets and triplets.  . S/P AVR (aortic valve replacement)    Echo 11/17:  EF 40-45, mild diff HK, Gr 2 DD, possibly bicuspid AV with very mild AS and severe AI, mild MR, mild LAE // R/L [facility] 1/18: LAD irregs prox 20, Normal R heart hemodynamics // Intraop TEE 05/03/16:  EF 35-40, severe AI, mild MR  . S/P AVR (aortic valve replacement)    2/2 aortic insufficiency // Echo 11/17:  EF 40-45, mild diff HK, Gr 2 DD, possibly bicuspid AV with very mild AS and severe AI, mild MR, mild LAE // R/L [facility] 1/18: LAD irregs prox 20, Normal R heart  hemodynamics // Intraop TEE 05/03/16:  EF 35-40, severe AI, mild MR    Past Surgical History:  Procedure Laterality Date  . AORTIC VALVE REPLACEMENT N/A 05/03/2016   Procedure: AORTIC VALVE REPLACEMENT (AVR) WITH MAGNA EASE PERICARDIAL BIOPROSTHESIS - AORTIC 21 MM MODEL 3300TFX;  Surgeon: [name], MD;  Location: [facility] OR;  Service: Open Heart Surgery;  Laterality: N/A;  . TEE WITHOUT CARDIOVERSION N/A 05/03/2016   Procedure: TRANSESOPHAGEAL ECHOCARDIOGRAM (TEE);  Surgeon: [name], MD;  Location: [facility] OR;  Service: Open Heart Surgery;  Laterality: N/A;  . TONSILLECTOMY AND ADENOIDECTOMY      Current Outpatient Prescriptions  Medication Sig Dispense Refill  . amphetamine-dextroamphetamine (ADDERALL) 20 MG tablet Take 20 mg by mouth daily. Reported on 08/18/2015    . aspirin EC 325 MG tablet Take 1 tablet (325 mg total) by mouth daily. 30 tablet 0  . LORazepam (ATIVAN) 0.5 MG tablet Take 0.5 mg by mouth daily as needed for anxiety.     . metoprolol succinate (TOPROL-XL) 25 MG 24 hr tablet Take 1 tablet (25 mg total) by mouth at bedtime. (Patient taking differently: Take 25 mg by mouth at bedtime. Take 1/2 tablet po at bedtime.) 90 tablet 3  . rizatriptan (MAXALT-MLT) 10 MG disintegrating tablet Take 1 tablet (10 mg total) by mouth once as needed for migraine. May  repeat in 2 hours if needed 10 tablet 1   No current facility-administered medications for this visit.     Family History  Problem Relation Age of Onset  . Diabetes Mother   . Ovarian cancer Maternal Grandmother 80  . Breast cancer Sister 85  . Congestive Heart Failure Maternal Grandfather   . Heart disease Brother        valve replacement  . Deep vein thrombosis Brother   . Lung cancer Brother   . Deep vein thrombosis Father   . Deep vein thrombosis Paternal Aunt        x2  . Deep vein thrombosis Paternal Grandmother     ROS:  Pertinent items are noted in HPI.  Otherwise, a comprehensive ROS was  negative.  Exam:   BP 112/70 (BP Location: Right Arm, Patient Position: Sitting, Cuff Size: Normal)   Pulse 80   Resp 16   Ht 5' 4.5" (1.638 m)   Wt 146 lb (66.2 kg)   LMP 08/12/2004   BMI 24.67 kg/m   Weight change:  +6#  Height: 5' 4.5" (163.8 cm)  Ht Readings from Last 3 Encounters:  11/23/16 5' 4.5" (1.638 m)  11/09/16 5\' 5"  (1.651 m)  09/15/16 5\' 4"  (1.626 m)    General appearance: alert, cooperative and appears stated age Head: Normocephalic, without obvious abnormality, atraumatic Neck: no adenopathy, supple, symmetrical, trachea midline and thyroid normal to inspection and palpation Lungs: clear to auscultation bilaterally Breasts: normal appearance, no masses or tenderness Heart: regular rate and rhythm Abdomen: soft, non-tender; bowel sounds normal; no masses,  no organomegaly Extremities: extremities normal, atraumatic, no cyanosis or edema Skin: Skin color, texture, turgor normal. No rashes or lesions Lymph nodes: Cervical, supraclavicular, and axillary nodes normal. No abnormal inguinal nodes palpated Neurologic: Grossly normal   Pelvic: External genitalia:  no lesions              Urethra:  normal appearing urethra with no masses, tenderness or lesions              Bartholins and Skenes: normal                 Vagina: normal appearing vagina with normal color and discharge, no lesions              Cervix: no lesions              Pap taken: Yes.   Bimanual Exam:  Uterus:  normal size, contour, position, consistency, mobility, non-tender              Adnexa: normal adnexa and no mass, fullness, tenderness               Rectovaginal: Confirms               Anus:  normal sphincter tone, no lesions  Chaperone was present for exam.  A:  Well Woman with normal exam PMP, of HRT Anxiety H/O migraines under good control Vit D def H/O aortic valve replacement 2/18  P:   Mammogram guidelines reviewed.  Pt aware this is overdue.  States she will schedule Pap smear  and HR HPV obtained today Declines colonoscopy.  Will order cologuard. Declines singles vaccine  Maxalt rx sent to pharmacy on file. Return annually or prn

## 2016-11-24 LAB — CYTOLOGY - PAP
Diagnosis: NEGATIVE
HPV (WINDOPATH): NOT DETECTED

## 2016-11-25 ENCOUNTER — Ambulatory Visit: Payer: BC Managed Care – PPO | Admitting: Obstetrics & Gynecology

## 2016-12-13 NOTE — Addendum Note (Signed)
Encounter addended by: Robyne Peers, RN on: 12/13/2016 11:12 AM<BR>    Actions taken: Episode resolved

## 2016-12-13 NOTE — Addendum Note (Signed)
Encounter addended by: Robyne Peers, RN on: 12/13/2016 11:14 AM<BR>    Actions taken: Episode un-resolved, Sign clinical note, Episode resolved

## 2016-12-13 NOTE — Progress Notes (Signed)
Discharge Progress Report  Patient Details  Name: Wanda [name] MRN: [id number] Date of Birth: 1950-11-17 Referring Provider:     CARDIAC REHAB PHASE II ORIENTATION from 07/05/2016 in [facility]  Referring Provider  Sherren Mocha, MD.       Number of Visits: 32  Reason for Discharge:  Patient has met program and personal goals.  Smoking History:  History  Smoking Status  . Former Smoker  Smokeless Tobacco  . Never Used    Comment: in college    Diagnosis:  05/03/16 S/P AVR (aortic valve replacement)  ADL UCSD:   Initial Exercise Prescription:     Initial Exercise Prescription - 07/05/16 1400      Date of Initial Exercise RX and Referring Provider   Date 07/05/16   Referring Provider Sherren Mocha, MD.     Treadmill   MPH 2.3   Grade 1   Minutes 10   METs 3.08     Bike   Level 1   Minutes 10   METs 3.9     NuStep   Level 3   SPM 85   Minutes 10   METs 2.8     Prescription Details   Frequency (times per week) 3   Duration Progress to 30 minutes of continuous aerobic without signs/symptoms of physical distress     Intensity   THRR 40-80% of Max Heartrate 62-125   Ratings of Perceived Exertion 11-13   Perceived Dyspnea 0-4     Progression   Progression Continue to progress workloads to maintain intensity without signs/symptoms of physical distress.     Resistance Training   Training Prescription Yes   Weight 2lbs.   Reps 10-15      Discharge Exercise Prescription (Final Exercise Prescription Changes):     Exercise Prescription Changes - 11/07/16 1021      Response to Exercise   Blood Pressure (Admit) 114/70   Blood Pressure (Exercise) 114/62   Blood Pressure (Exit) 102/62   Heart Rate (Admit) 93 bpm   Heart Rate (Exercise) 148 bpm   Heart Rate (Exit) 90 bpm   Rating of Perceived Exertion (Exercise) 12   Symptoms none   Duration Continue with 30 min of aerobic exercise without signs/symptoms of  physical distress.   Intensity THRR unchanged     Progression   Progression Continue to progress workloads to maintain intensity without signs/symptoms of physical distress.   Average METs 5.8     Resistance Training   Training Prescription Yes   Weight 5lbs   Reps 10-15   Time 10 Minutes     Treadmill   MPH 3.8   Grade 5   Minutes 20   METs 6.53     Track   Laps 11   Minutes 6   METs 5.04     Home Exercise Plan   Plans to continue exercise at Home (comment)  walking   Frequency Add 2 additional days to program exercise sessions.   Initial Home Exercises Provided 10/05/16      Functional Capacity:     6 Minute Walk    Row Name 07/05/16 1437 11/09/16 1020 11/09/16 1023     6 Minute Walk   Phase Initial Discharge  -   Distance 1682 feet 2200 feet  -   Distance % Change  - 30.8 %  -   Distance Feet Change  - 518 ft  -   Walk Time 6 minutes 6 minutes  -   #  of Rest Breaks 0 0  -   MPH 3.19 4.2  -   METS 3.91 5.04  -   RPE 9 11  -   VO2 Peak 13.68 17.6  -   Symptoms No No  -   Resting HR 68 bpm 93 bpm  -   Resting BP 110/76 114/70  -   Max Ex. HR 93 bpm 127 bpm  -   Max Ex. BP 122/70 114/62  -   2 Minute Post BP 114/72 104/60 102/62      Psychological, QOL, Others - Outcomes: PHQ 2/9: Depression screen [facility] 2/9 11/07/2016 07/11/2016  Decreased Interest 0 0  Down, Depressed, Hopeless 0 1  PHQ - 2 Score 0 1    Quality of Life:     Quality of Life - 10/10/16 1636      Quality of Life Scores   Health/Function Pre 25.63 %   Socioeconomic Pre 26.43 %   Psych/Spiritual Pre 27 %   Family Pre 30 %   GLOBAL Pre 26.72 %  overall scores good. pt exhibits health related anxiety from recent cardiac event.  pt biggest concerns are continued symptoms, worries and changes related chronic illness. However, pt has positive outlook with good coping skills.       Personal Goals: Goals established at orientation with interventions provided to work toward goal.      Personal Goals and Risk Factors at Admission - 07/05/16 0910      Core Components/Risk Factors/Patient Goals on Admission   Personal Goal Other Yes   Personal Goal Have a better understanding of the healing process with cardiac surgery (AVR) and medication management   Intervention Provide cardiac education on disease process, medication management and risk factor modifications.    Expected Outcomes Pt will have increased knowledge and awareness on cardiovascular disease and medication management       Personal Goals Discharge:     Goals and Risk Factor Review    Row Name 07/29/16 0748 07/29/16 0805 08/17/16 1529 09/26/16 1629 10/27/16 1036     Core Components/Risk Factors/Patient Goals Review   Personal Goals Review Other  Have a better understanding of the healing process with cardiac surgery (AVR) and medication management  - Other Other Other   Review pt is actively participating in cardiac rehab activities without difficulty. pt educated on sun sensitivity from medication side effects.  pt informed to use sunscreen and wear protective clothing for sun exposure.   - pt is actively participating in cardiac rehab activities without difficulty. pt reports decreased fatigue and sun sensitivity. pt also notes increased confidence.   pt is actively participating in cardiac rehab activities without difficulty. pt reports decreased fatigue and sun sensitivity. pt also notes increased confidence.   pt is actively participating in cardiac rehab activities without difficulty. pt displays increased self confidence.    Expected Outcomes  - Pt will have increased knowledge and awareness on cardiovascular disease and medication management  - Pt will have increased knowledge and awareness on cardiovascular disease and medication management Pt will have increased knowledge and awareness on cardiovascular disease and medication management   Row Name 11/07/16 1511             Core Components/Risk  Factors/Patient Goals Review   Personal Goals Review Other       Review pt graduated from cardiac rehab program with 32 visits.  pt displays increased self confidence, strength/stamina and energy level.  pt plans  to continue exercisng  in  cardiac maintenance program.       Expected Outcomes pt will continue exercise, nutrition and lifestyle modification to reduce overall risk factors.           Exercise Goals and Review:     Exercise Goals    Row Name 07/05/16 0908             Exercise Goals   Increase Physical Activity Yes       Intervention Provide advice, education, support and counseling about physical activity/exercise needs.;Develop an individualized exercise prescription for aerobic and resistive training based on initial evaluation findings, risk stratification, comorbidities and participant's personal goals.       Expected Outcomes Achievement of increased cardiorespiratory fitness and enhanced flexibility, muscular endurance and strength shown through measurements of functional capacity and personal statement of participant.       Increase Strength and Stamina Yes       Intervention Provide advice, education, support and counseling about physical activity/exercise needs.;Develop an individualized exercise prescription for aerobic and resistive training based on initial evaluation findings, risk stratification, comorbidities and participant's personal goals.       Expected Outcomes Achievement of increased cardiorespiratory fitness and enhanced flexibility, muscular endurance and strength shown through measurements of functional capacity and personal statement of participant.          Nutrition & Weight - Outcomes:     Pre Biometrics - 07/05/16 1439      Pre Biometrics   Height _0  (1.651 m)   Weight 145 lb 4.5 oz (65.9 kg)   Waist Circumference 29 inches   Hip Circumference 39 inches   Waist to Hip Ratio 0.74 %   BMI (Calculated) 24.2   Triceps Skinfold 33 mm   %  Body Fat 35.5 %   Grip Strength 32 kg   Flexibility 15 in   Single Leg Stand 30 seconds         Post Biometrics - 11/09/16 1036       Post  Biometrics   Height _1  (1.651 m)   Weight 145 lb 8.1 oz (66 kg)   Waist Circumference 28 inches   Hip Circumference 38.5 inches   Waist to Hip Ratio 0.73 %   BMI (Calculated) 24.21   Triceps Skinfold 33 mm   % Body Fat 35.1 %   Grip Strength 34 kg   Flexibility 16 in   Single Leg Stand 30 seconds      Nutrition:     Nutrition Therapy & Goals - 08/17/16 1432      Nutrition Therapy   Diet Low Sodium     Personal Nutrition Goals   Nutrition Goal Wt loss of 1-2 lb/week to a wt loss goal of 6-10 lb at graduation from Cardiac Rehab.     Intervention Plan   Intervention Prescribe, educate and counsel regarding individualized specific dietary modifications aiming towards targeted core components such as weight, hypertension, lipid management, diabetes, heart failure and other comorbidities.   Expected Outcomes Short Term Goal: Understand basic principles of dietary content, such as calories, fat, sodium, cholesterol and nutrients.;Long Term Goal: Adherence to prescribed nutrition plan.      Nutrition Discharge:     Nutrition Assessments - 12/01/16 1345      MEDFICTS Scores   Pre Score 38      Education Questionnaire Score:     Knowledge Questionnaire Score - 10/05/16 1618      Knowledge Questionnaire Score   Pre Score 24/24  Goals reviewed with patient; copy given to patient.

## 2017-01-09 ENCOUNTER — Telehealth: Payer: Self-pay | Admitting: Cardiovascular Disease

## 2017-01-09 NOTE — Telephone Encounter (Signed)
New Message  Pt husband call requesting to speak with RN about getting pt a sooner f/u appt from pts open heart surgery. Please call back to discuss

## 2017-01-09 NOTE — Telephone Encounter (Signed)
Spoke with patient who states she received her letter for follow-up with Dr. Burt Knack and she would like to see if it is possible to be seen before the end of 2018 because deductible has been met. She states she is retired and is very flexible on time. I advised that I will forward message to Dr. Antionette Char primary nurse and that she will call her later to schedule. Patient thanked me for the call.

## 2017-01-10 NOTE — Telephone Encounter (Signed)
Attempted to call patient - left message to call back.  Called patient's husband (DPR). Scheduled patient 11/28 with Dr. Excell Seltzer.  He was grateful for call and agrees with treatment plan.

## 2017-01-26 DIAGNOSIS — F419 Anxiety disorder, unspecified: Secondary | ICD-10-CM | POA: Insufficient documentation

## 2017-01-26 DIAGNOSIS — G43909 Migraine, unspecified, not intractable, without status migrainosus: Secondary | ICD-10-CM | POA: Insufficient documentation

## 2017-01-26 DIAGNOSIS — M1990 Unspecified osteoarthritis, unspecified site: Secondary | ICD-10-CM | POA: Insufficient documentation

## 2017-01-26 DIAGNOSIS — F32A Depression, unspecified: Secondary | ICD-10-CM | POA: Insufficient documentation

## 2017-01-26 DIAGNOSIS — F329 Major depressive disorder, single episode, unspecified: Secondary | ICD-10-CM | POA: Insufficient documentation

## 2017-02-08 ENCOUNTER — Encounter: Payer: Self-pay | Admitting: Cardiovascular Disease

## 2017-02-08 ENCOUNTER — Ambulatory Visit (INDEPENDENT_AMBULATORY_CARE_PROVIDER_SITE_OTHER): Payer: BC Managed Care – PPO | Admitting: Cardiovascular Disease

## 2017-02-08 VITALS — BP 116/70 | HR 72 | Ht 64.0 in | Wt 149.4 lb

## 2017-02-08 DIAGNOSIS — I359 Nonrheumatic aortic valve disorder, unspecified: Secondary | ICD-10-CM

## 2017-02-08 DIAGNOSIS — I428 Other cardiomyopathies: Secondary | ICD-10-CM | POA: Diagnosis not present

## 2017-02-08 MED ORDER — METOPROLOL SUCCINATE ER 25 MG PO TB24
ORAL_TABLET | ORAL | 11 refills | Status: DC
Start: 2017-02-08 — End: 2017-08-21

## 2017-02-08 MED ORDER — ASPIRIN 81 MG PO TBEC: 81.0000 mg | DELAYED_RELEASE_TABLET | Freq: Every day | ORAL | Status: DC

## 2017-02-08 NOTE — Patient Instructions (Signed)
Medication Instructions:  1) DECREASE ASPIRIN to 81 mg daily 2) Your medication list has been updated to reflect you take Toprol 6.25 mg daily  Labwork: None  Testing/Procedures: Your provider has requested that you have an echocardiogram in 6 months. Echocardiography is a painless test that uses sound waves to create images of your heart. It provides your doctor with information about the size and shape of your heart and how well your heart's chambers and valves are working. This procedure takes approximately one hour. There are no restrictions for this procedure.  Follow-Up: Your provider wants you to follow-up in: 6 months with Dr. Excell Seltzer. You will receive a reminder letter in the mail two months in advance. If you don't receive a letter, please call our office to schedule the follow-up appointment.    Any Other Special Instructions Will Be Listed Below (If Applicable).     If you need a refill on your cardiac medications before your next appointment, please call your pharmacy.

## 2017-02-08 NOTE — Progress Notes (Signed)
Cardiology Office Note Date:  02/11/2017   ID:  Ivin BootyMary J [name], DOB 06/30/1950, MRN [id number]  PCP:  [place], [name], MD  Cardiologist:  Tonny BollmanMichael Britzy Graul, MD    Chief Complaint  Patient presents with  . Shortness of Breath     History of Present Illness: Wanda ColeMary J [name] is a 66 y.o. female who presents for follow-up of aortic valve disease. She underwent bioprosthetic AVR in February 2018.  She is making slow progress since surgery. Still feels a little short of breath with activity. Denies chest pain, edema, orthopnea, or PND. Lightheadedness and fatigue have improved since she has cut back further on her beta blocker, now only taking Toprol XL 6.25 mg daily.   Past Medical History:  Diagnosis Date  . Anxiety   . Arthritis   . Chronic systolic heart failure (HCC) 05/25/2016  . Depression   . Migraine   . Persistent atrial fibrillation (HCC) 05/13/2016   Post AVR // Amiodarone // Coumadin started; possible duration 6-8 weeks  . PVC's (premature ventricular contractions) 05/25/2016   Holter 11/17 The basic rhythm is sinus. There are frequent PVCs, periods of ventricular bigeminy, and occasional ventricular couplets and triplets.  . S/P AVR (aortic valve replacement)     Past Surgical History:  Procedure Laterality Date  . AORTIC VALVE REPLACEMENT N/A 05/03/2016   Procedure: AORTIC VALVE REPLACEMENT (AVR) WITH MAGNA EASE PERICARDIAL BIOPROSTHESIS - AORTIC 21 MM MODEL 3300TFX;  Surgeon: Delight OvensEdward B Gerhardt, MD;  [facility];  Service: Open Heart Surgery;  Laterality: N/A;  . TEE WITHOUT CARDIOVERSION N/A 05/03/2016   Procedure: TRANSESOPHAGEAL ECHOCARDIOGRAM (TEE);  Surgeon: Delight OvensEdward B Gerhardt, MD;  [facility];  Service: Open Heart Surgery;  Laterality: N/A;  . TONSILLECTOMY AND ADENOIDECTOMY      Current Outpatient Medications  Medication Sig Dispense Refill  . amphetamine-dextroamphetamine (ADDERALL) 20 MG tablet Take 20 mg by mouth daily. Reported on 08/18/2015    . aspirin EC 81  MG EC tablet Take 1 tablet (81 mg total) by mouth daily.    Marland Kitchen. LORazepam (ATIVAN) 0.5 MG tablet Take 0.5 mg by mouth daily as needed for anxiety.     . metoprolol succinate (TOPROL-XL) 25 MG 24 hr tablet Take 6.25 mg by mouth daily. 6 tablet 11  . rizatriptan (MAXALT-MLT) 10 MG disintegrating tablet Take 10 mg by mouth as directed. May repeat in 2 hours if needed     No current facility-administered medications for this visit.     Allergies:   Codeine and Vicodin [hydrocodone-acetaminophen]   Social History:  The patient  reports that she has quit smoking. she has never used smokeless tobacco. She reports that she drinks alcohol. She reports that she does not use drugs.   Family History:  The patient's  family history includes Breast cancer (age of onset: 5448) in her sister; Congestive Heart Failure in her maternal grandfather; Deep vein thrombosis in her brother, father, paternal aunt, and paternal grandmother; Diabetes in her mother; Heart disease in her brother; Lung cancer in her brother; Ovarian cancer (age of onset: 4680) in her maternal grandmother.    ROS:  Please see the history of present illness.  All other systems are reviewed and negative.    PHYSICAL EXAM: VS:  BP 116/70   Pulse 72   Ht 5\' 4"  (1.626 m)   Wt 149 lb 6.4 oz (67.8 kg)   LMP 08/12/2004   BMI 25.64 kg/m  , BMI Body mass index is 25.64 kg/m.  GEN: Well nourished, well developed, in no acute distress  HEENT: normal  Neck: no JVD, no masses. No carotid bruits Cardiac: RRR with soft SEM at the RUSB              Respiratory:  clear to auscultation bilaterally, normal work of breathing GI: soft, nontender, nondistended, + BS MS: no deformity or atrophy  Ext: no pretibial edema, pedal pulses 2+= bilaterally Skin: warm and dry, no rash Neuro:  Strength and sensation are intact Psych: euthymic mood, full affect  EKG:  EKG is ordered today. The ekg ordered today shows NSR, ST-T abnormality consider inferolateral  ischemia, no significant change from previous  Recent Labs: 04/29/2016: ALT 12 05/04/2016: Magnesium 2.5 05/08/2016: Hemoglobin 10.3; Platelets 200 05/10/2016: BUN 12; Creatinine, Ser 1.03; Potassium 4.0; Sodium 137   Lipid Panel     Component Value Date/Time   CHOL 247 (H) 08/18/2015 1600   TRIG 73 08/18/2015 1600   HDL 108 08/18/2015 1600   CHOLHDL 2.3 08/18/2015 1600   VLDL 15 08/18/2015 1600   LDLCALC 124 08/18/2015 1600      Wt Readings from Last 3 Encounters:  02/08/17 149 lb 6.4 oz (67.8 kg)  11/23/16 146 lb (66.2 kg)  11/09/16 145 lb 8.1 oz (66 kg)     Cardiac Studies Reviewed: Echo 08-10-2016: Study Conclusions  - Left ventricle: Diffuse hypokinesis with abnormal septal motion.   The cavity size was mildly dilated. Wall thickness was increased   in a pattern of moderate LVH. Systolic function was mildly to   moderately reduced. The estimated ejection fraction was in the   range of 40% to 45%. Left ventricular diastolic function   parameters were normal. - Mitral valve: There was mild regurgitation. - Atrial septum: No defect or patent foramen ovale was identified. - Impressions: Dimmensionless Index is .39 suggesting normal valve   function  Impressions:  - Dimmensionless Index is .39 suggesting normal valve function  ASSESSMENT AND PLAN: 1.  Aortic valve disease: normal function of her aortic bioprosthesis on FU echo imaging. Understands the need for SBE prophylaxis.   2. Chronic systolic heart failure, NYHA II symptoms: LVEF 40-45%. Unable to tolerate medical therapy secondary to symptomatic hypotension. Currently on minimal Rx with Toprol XL 6.25 mg. Will try to stay on this.   Current medicines are reviewed with the patient today.  The patient does not have concerns regarding medicines.  Labs/ tests ordered today include:   Orders Placed This Encounter  Procedures  . EKG 12-Lead  . ECHOCARDIOGRAM COMPLETE    Disposition:   FU 6 months with an  echo prior to the visit  Signed, Tonny Bollman, MD  02/11/2017 11:07 PM    [facility] [address] [place], [place], [place]  [address] Phone: [phone]; Fax: [phone]

## 2017-02-11 ENCOUNTER — Encounter: Payer: Self-pay | Admitting: Cardiovascular Disease

## 2017-04-28 IMAGING — CR DG CHEST 1V PORT
1 series · 1 of 1 positions shown · non-contrast
Comparison: May 03, 2016

CLINICAL DATA: Status post aortic valve replacement.  Extubation.

EXAM:
PORTABLE CHEST 1 VIEW

[AP]
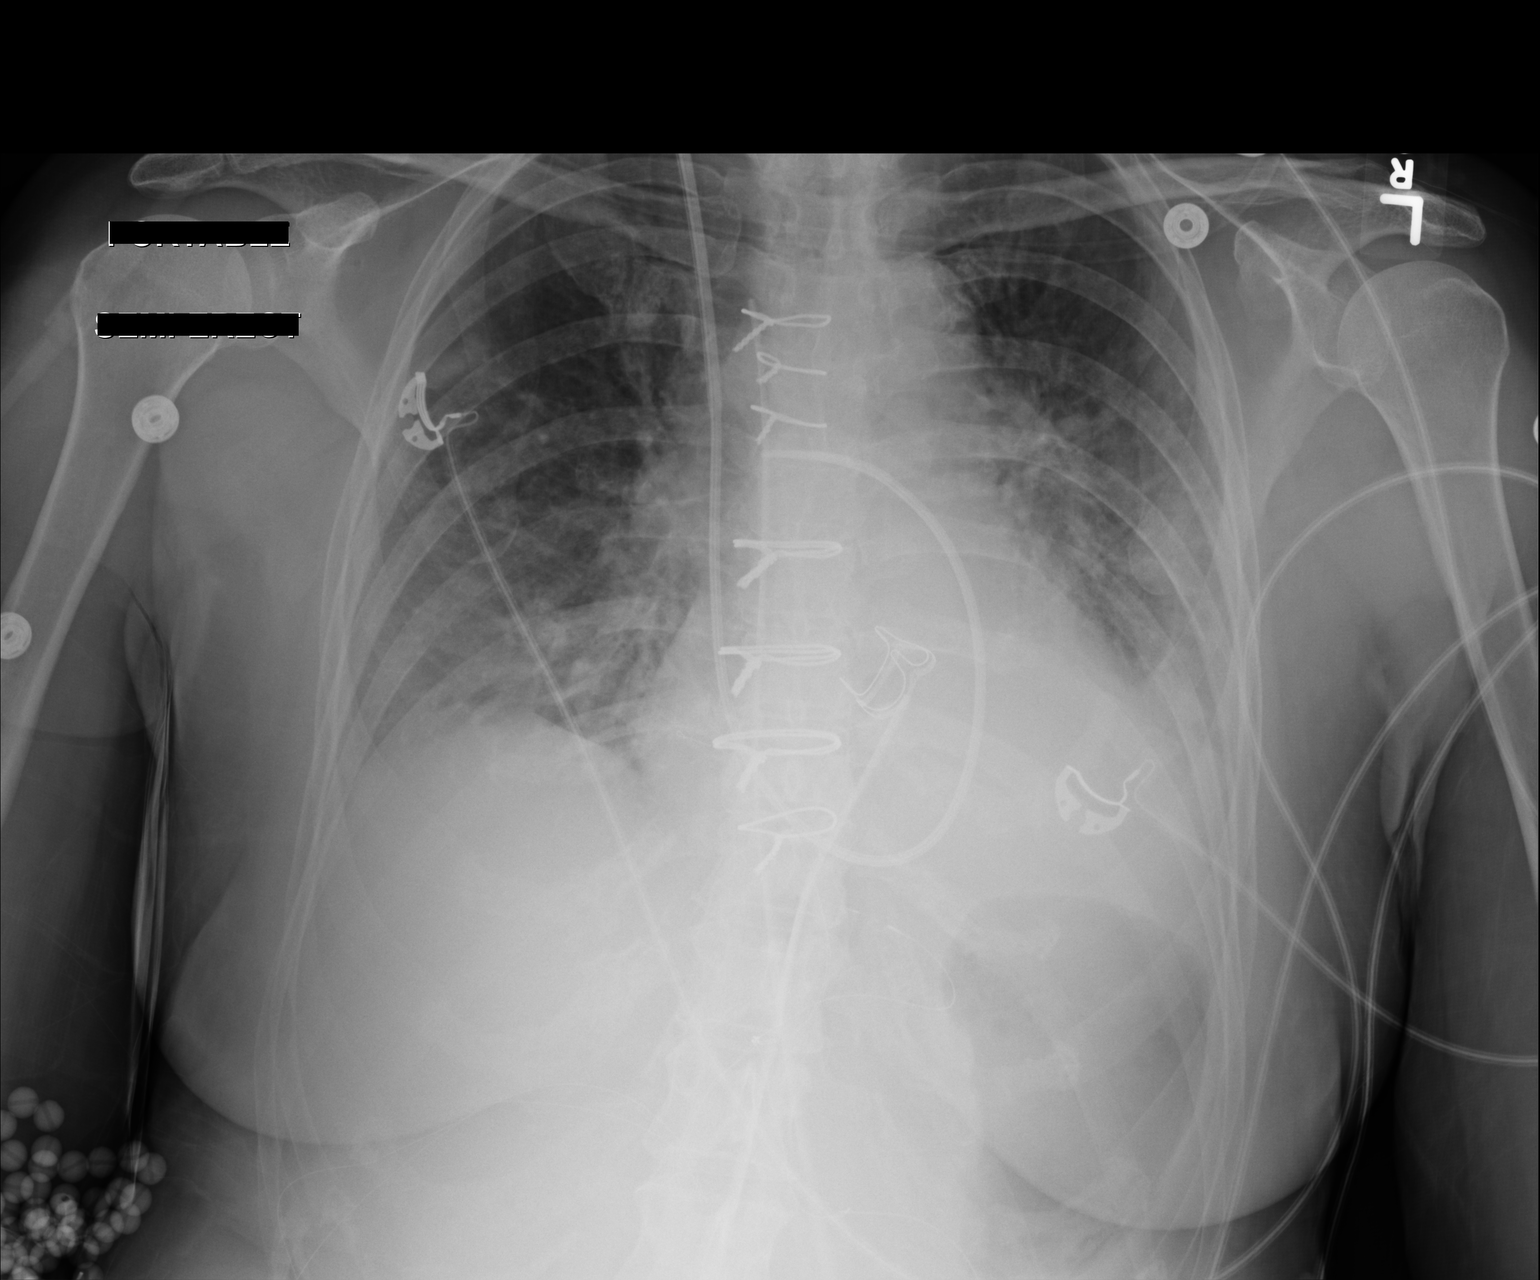

[1 of 1 positions shown; findings below may reference images not displayed]

FINDINGS: Endotracheal tube and nasogastric tube have been removed. Swan-Ganz
catheter tip is in the right main pulmonary artery. Mediastinal
drain is present. Temporary pacemaker wires are attached to the
right heart. There is a small right apical pneumothorax. There is
interstitial edema with small pleural effusions bilaterally. There
is airspace opacity with volume loss in the left lower lobe. There
is mild cardiomegaly. The pulmonary vascularity shows evidence of a
mild degree of pulmonary venous hypertension. No adenopathy evident.
Patient is status post aortic valve replacement.
IMPRESSION: Tube and catheter positions as described. There is currently a small
right apical pneumothorax without tension component. This
pneumothorax is felt to be approximately 5% with respect to size.
There is a degree of underlying congestive heart failure. Opacity in
the left lower lobe is enlarged part due to atelectasis. There may
be either alveolar edema or pneumonia associated in the left lower
lobe. The appearance is stable. The cardiac silhouette is stable.

These results will be called to the ordering clinician or
representative by the Radiologist Assistant, and communication
documented in the PACS or zVision Dashboard.

## 2017-05-02 IMAGING — CR DG CHEST 2V
2 series · 2 of 2 positions shown · non-contrast
Comparison: 05/07/2016

CLINICAL DATA: Pleural effusion; s/p aortic valve replacement
05/03/16; former smoker

EXAM:
CHEST  2 VIEW

[chest pa]
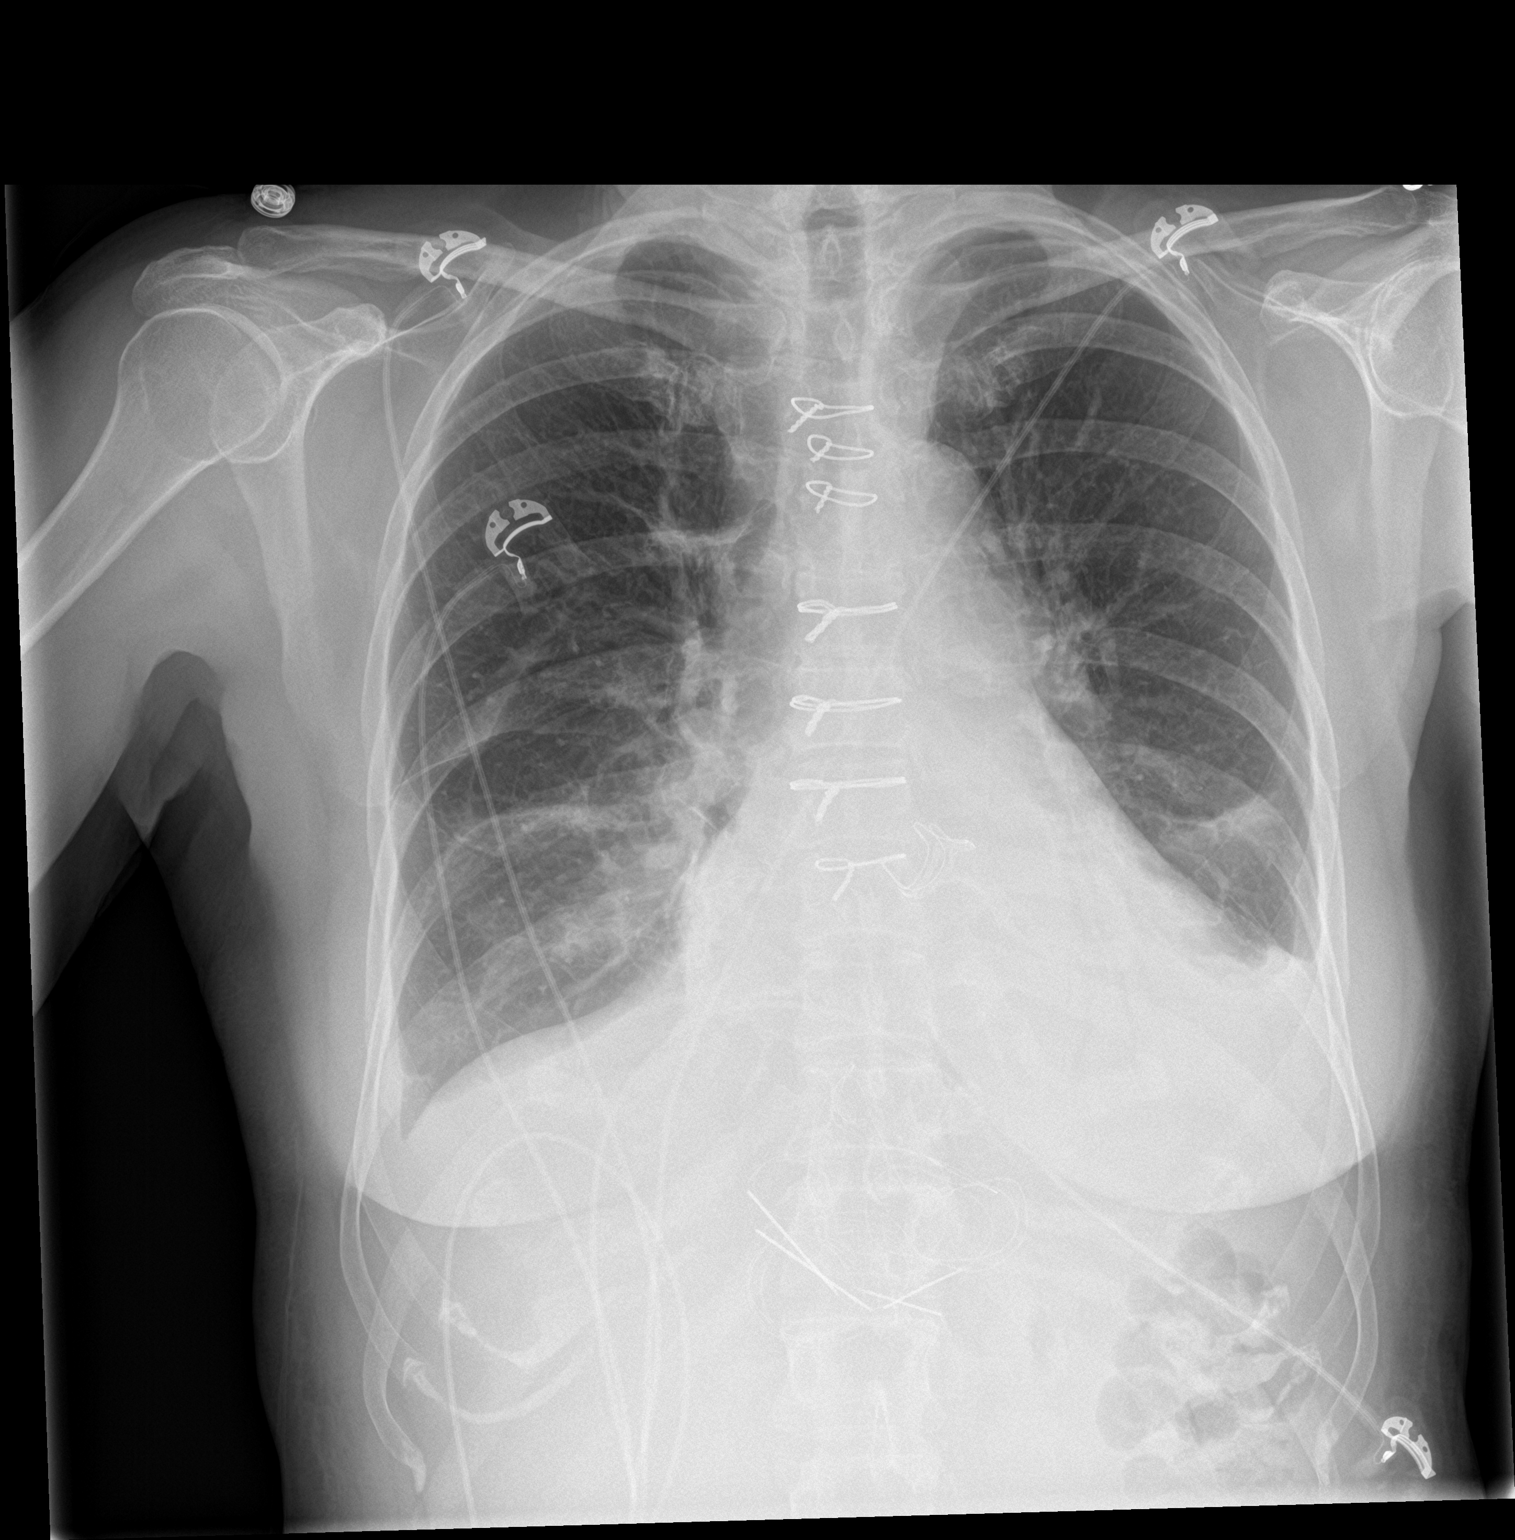

[chest lat]
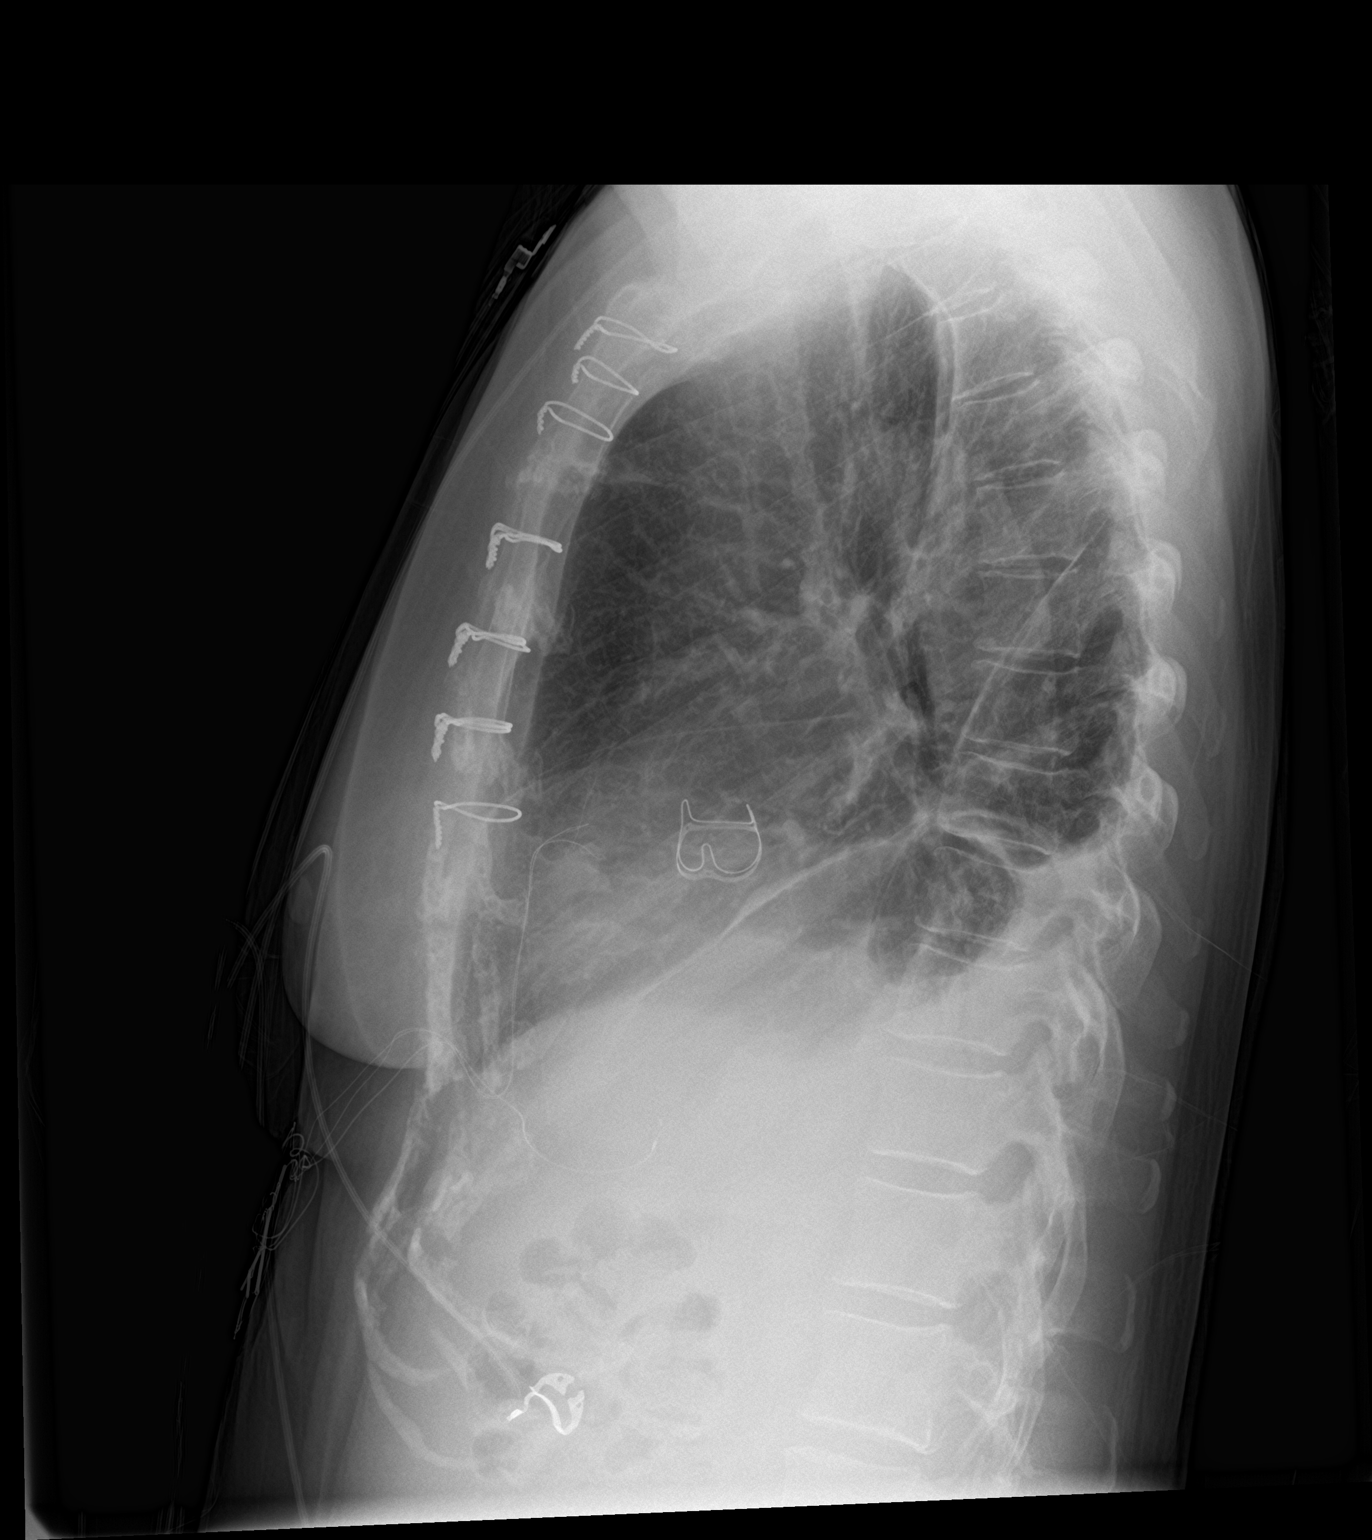

[2 of 2 positions shown; findings below may reference images not displayed]

FINDINGS: Changes from cardiac surgery stable. There is no mediastinal
widening.

There persistent lung base opacities, left greater than right, most
likely a combination of atelectasis and small pleural effusions.
There is no pulmonary edema. No new lung abnormalities.

No pneumothorax.

Since the prior study, the right internal jugular Cordis has been
removed.
IMPRESSION: 1. No acute finding or evidence of an operative complication.
2. Persistent left greater than right lung base opacity consistent
with combination of atelectasis and small effusions.

## 2017-05-16 ENCOUNTER — Telehealth: Payer: Self-pay

## 2017-05-16 NOTE — Telephone Encounter (Signed)
-----   Message from Henrietta Dine, RN sent at 02/08/2017 11:57 AM EST ----- Regarding: OV [facility]/ECHO (not same day) due late May 2019 For AVD

## 2017-05-16 NOTE — Telephone Encounter (Signed)
Per Dr. Excell Seltzer, called to arrange ECHO and office visit in June.  Left message to call back.

## 2017-05-26 NOTE — Telephone Encounter (Signed)
Left message to call back to arrange echo and OV with Dr. Excell Seltzer in late May, early June.

## 2017-06-01 NOTE — Telephone Encounter (Signed)
Patient has scheduled echo 6/6 and OV 6/10.

## 2017-06-10 IMAGING — CR DG CHEST 2V
2 series · 2 of 2 positions shown · non-contrast
Comparison: 05/08/2016.

CLINICAL DATA: Aortic valve replacement.

EXAM:
CHEST  2 VIEW

[w chest pa]
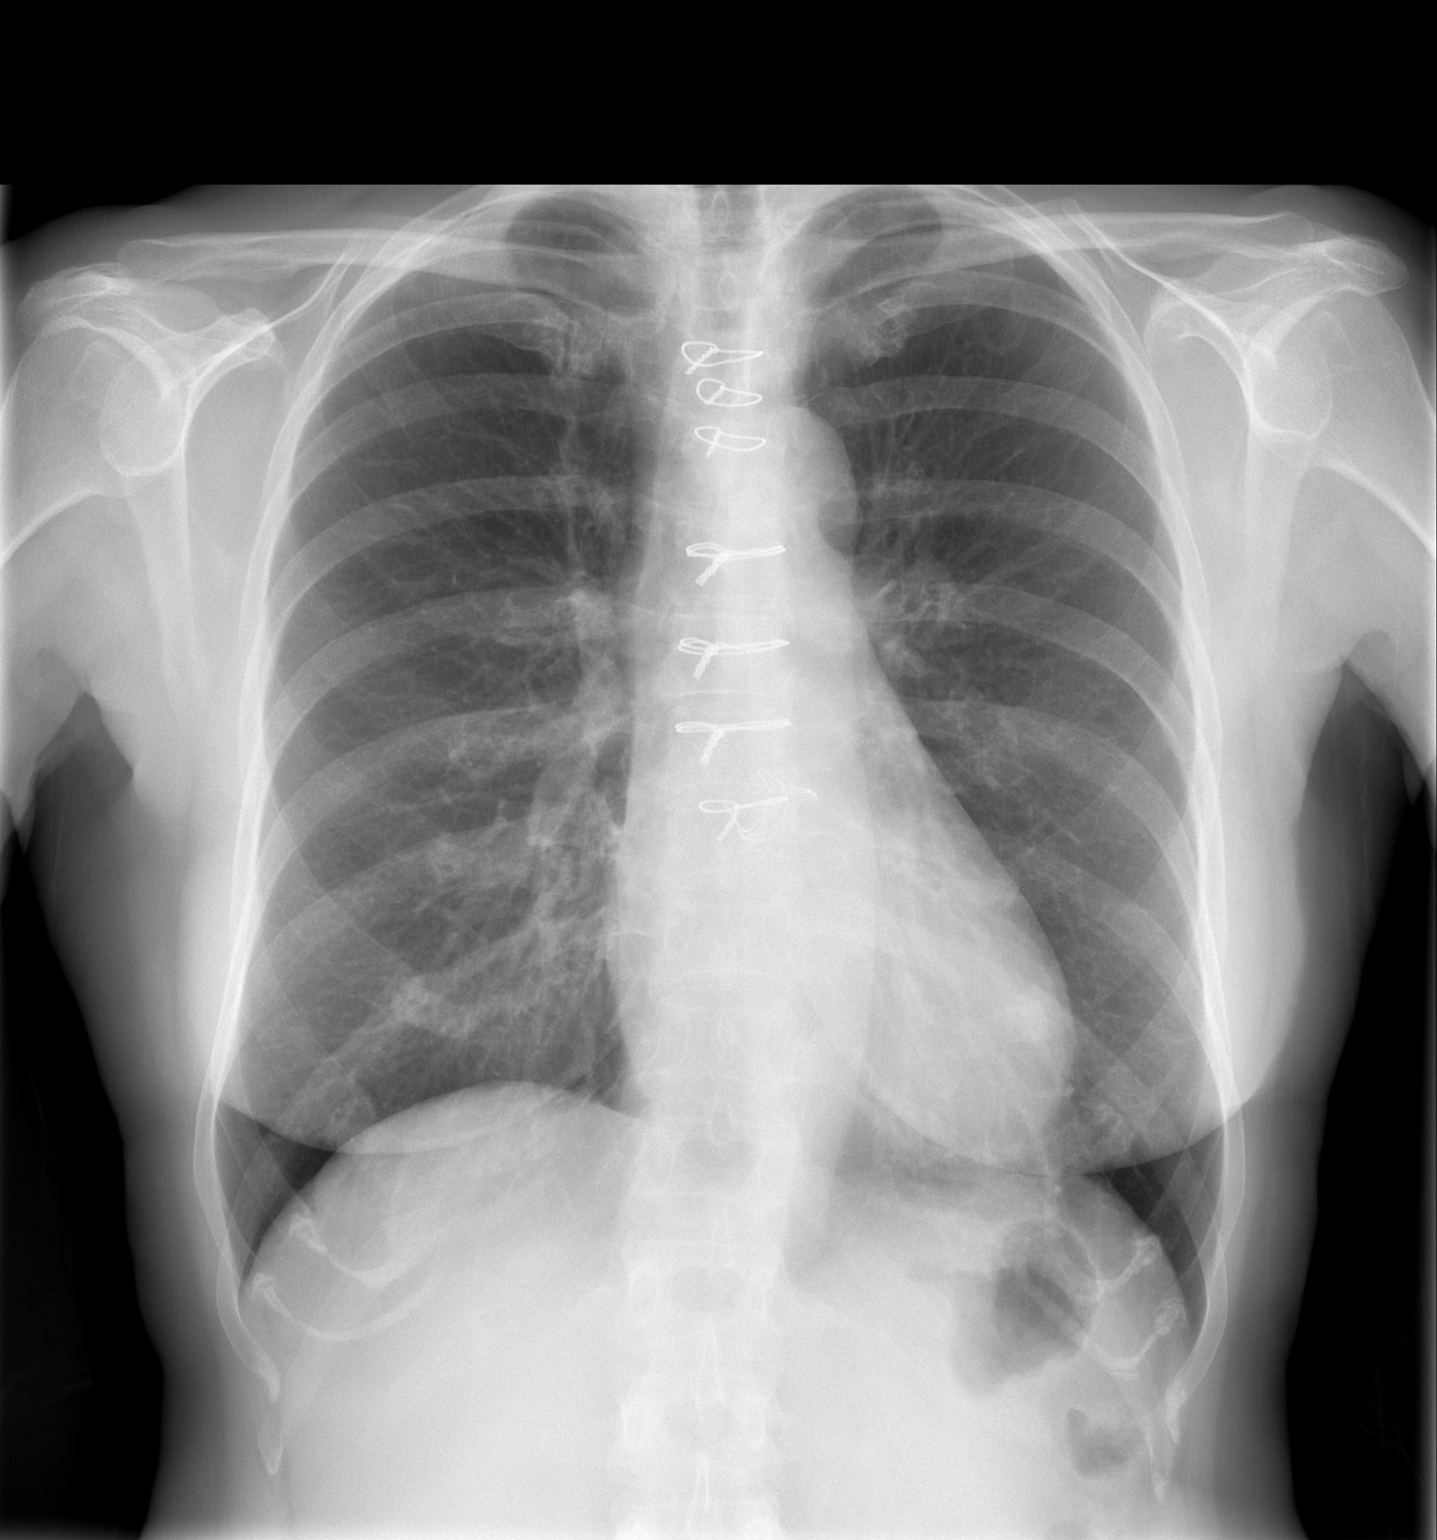

[w chest lat]
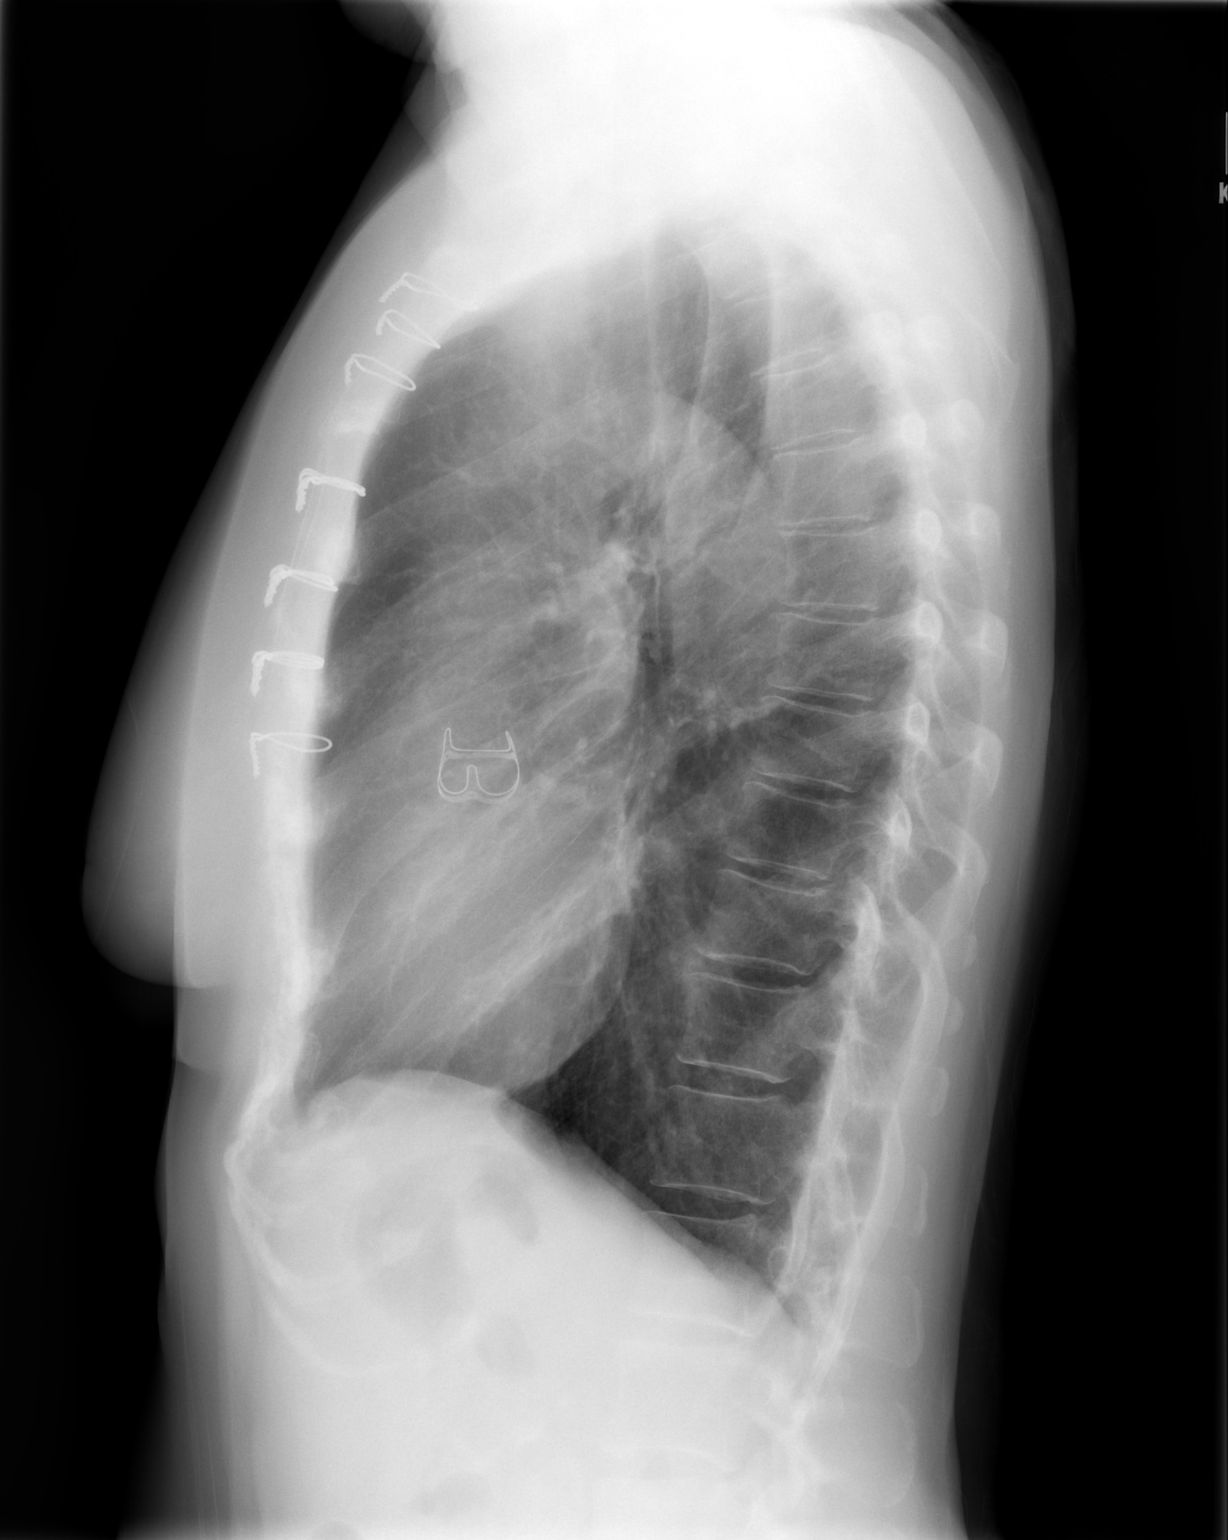

[2 of 2 positions shown; findings below may reference images not displayed]

FINDINGS: Prior cardiac valve replacement. Heart size stable. No focal
infiltrate. Interim clearing of bibasilar atelectasis/infiltrates.
Interim clearing of bilateral pleural effusions. No pneumothorax. No
acute bony abnormality .
IMPRESSION: Prior cardiac valve replacement. Heart size stable. No acute
pulmonary disease. Interim clearing of previously identified
bibasilar atelectasis/infiltrate and pleural effusions.

## 2017-08-17 ENCOUNTER — Other Ambulatory Visit: Payer: Self-pay

## 2017-08-17 ENCOUNTER — Encounter (INDEPENDENT_AMBULATORY_CARE_PROVIDER_SITE_OTHER): Payer: Self-pay

## 2017-08-17 ENCOUNTER — Ambulatory Visit (HOSPITAL_COMMUNITY): Payer: Medicare Other | Attending: Cardiovascular Disease

## 2017-08-17 DIAGNOSIS — Z8249 Family history of ischemic heart disease and other diseases of the circulatory system: Secondary | ICD-10-CM | POA: Diagnosis not present

## 2017-08-17 DIAGNOSIS — I4891 Unspecified atrial fibrillation: Secondary | ICD-10-CM | POA: Diagnosis not present

## 2017-08-17 DIAGNOSIS — Z953 Presence of xenogenic heart valve: Secondary | ICD-10-CM | POA: Diagnosis not present

## 2017-08-17 DIAGNOSIS — I080 Rheumatic disorders of both mitral and aortic valves: Secondary | ICD-10-CM | POA: Diagnosis not present

## 2017-08-17 DIAGNOSIS — I428 Other cardiomyopathies: Secondary | ICD-10-CM

## 2017-08-17 DIAGNOSIS — I359 Nonrheumatic aortic valve disorder, unspecified: Secondary | ICD-10-CM

## 2017-08-17 DIAGNOSIS — Z87891 Personal history of nicotine dependence: Secondary | ICD-10-CM | POA: Diagnosis not present

## 2017-08-17 DIAGNOSIS — I429 Cardiomyopathy, unspecified: Secondary | ICD-10-CM | POA: Diagnosis not present

## 2017-08-17 DIAGNOSIS — I509 Heart failure, unspecified: Secondary | ICD-10-CM | POA: Diagnosis not present

## 2017-08-21 ENCOUNTER — Encounter: Payer: Self-pay | Admitting: Cardiovascular Disease

## 2017-08-21 ENCOUNTER — Ambulatory Visit: Payer: Medicare Other | Admitting: Cardiovascular Disease

## 2017-08-21 VITALS — BP 108/88 | HR 87 | Ht 64.0 in | Wt 143.5 lb

## 2017-08-21 DIAGNOSIS — I359 Nonrheumatic aortic valve disorder, unspecified: Secondary | ICD-10-CM | POA: Diagnosis not present

## 2017-08-21 NOTE — Progress Notes (Signed)
Cardiology Office Note Date:  08/21/2017   ID:  Wanda [name], Wanda [name] December 23, 1950, MRN [id number]  PCP:  [place], [name], MD  Cardiologist:  Tonny Bollman, MD    Chief Complaint  Patient presents with  . Follow-up    aortic valve disease     History of Present Illness: Wanda [name] is a 67 y.o. female who presents for follow-up evaluation.  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  The patient is here with her husband today.  She is doing very well.  She is completely off of metoprolol succinate now and could not tolerate it due to low blood pressure.  She is taking a daily aspirin.  Otherwise not on any cardiac medications.  She has very mild shortness of breath when walking up a hill, but otherwise is completely asymptomatic.  Denies any recurrence of dizziness, heart palpitations, edema, orthopnea, or PND.  She is had no chest pain.   Past Medical History:  Diagnosis Date  . Anxiety   . Arthritis   . Chronic systolic heart failure (HCC) 05/25/2016  . Depression   . Migraine   . Persistent atrial fibrillation (HCC) 05/13/2016   Post AVR // Amiodarone // Coumadin started; possible duration 6-8 weeks  . PVC's (premature ventricular contractions) 05/25/2016   Holter 11/17 The basic rhythm is sinus. There are frequent PVCs, periods of ventricular bigeminy, and occasional ventricular couplets and triplets.  . S/P AVR (aortic valve replacement)     Past Surgical History:  Procedure Laterality Date  . AORTIC VALVE REPLACEMENT N/A 05/03/2016   Procedure: AORTIC VALVE REPLACEMENT (AVR) WITH MAGNA EASE PERICARDIAL BIOPROSTHESIS - AORTIC 21 MM MODEL 3300TFX;  Surgeon: [name], MD;  Location: [facility] OR;  Service: Open Heart Surgery;  Laterality: N/A;  . TEE WITHOUT CARDIOVERSION N/A 05/03/2016   Procedure: TRANSESOPHAGEAL ECHOCARDIOGRAM  (TEE);  Surgeon: [name], MD;  Location: [facility] OR;  Service: Open Heart Surgery;  Laterality: N/A;  . TONSILLECTOMY AND ADENOIDECTOMY      Current Outpatient Medications  Medication Sig Dispense Refill  . amphetamine-dextroamphetamine (ADDERALL) 20 MG tablet Take 20 mg by mouth daily. Reported on 08/18/2015    . aspirin EC 81 MG EC tablet Take 1 tablet (81 mg total) by mouth daily.    Marland Kitchen LORazepam (ATIVAN) 0.5 MG tablet Take 0.5 mg by mouth daily as needed for anxiety.      No current facility-administered medications for this visit.     Allergies:   Codeine and Vicodin [hydrocodone-acetaminophen]   Social History:  The patient  reports that she has quit smoking. She has never used smokeless tobacco. She reports that she drinks alcohol. She reports that she does not use drugs.   Family History:  The patient's  family history includes Breast cancer (age of onset: 49) in her sister; Congestive Heart Failure in her maternal grandfather; Deep vein thrombosis in her brother, father, paternal aunt, and paternal grandmother; Diabetes in her mother; Heart disease in her brother; Lung cancer in her brother; Ovarian cancer (age of onset: 47) in her maternal grandmother.    ROS:  Please see the history of present illness.  All other systems are reviewed and negative.    PHYSICAL EXAM: VS:  BP 108/88   Pulse 87   Ht 5\' 4"  (1.626 m)   Wt 143 lb 8 oz (65.1 kg)  LMP 08/12/2004   SpO2 98%   BMI 24.63 kg/m  , BMI Body mass index is 24.63 kg/m. GEN: Well nourished, well developed, in no acute distress  HEENT: normal  Neck: no JVD, no masses. No carotid bruits Cardiac: RRR with 2/6 early peaking systolic ejection murmur at the right upper sternal border          Respiratory:  clear to auscultation bilaterally, normal work of breathing GI: soft, nontender, nondistended, + BS MS: no deformity or atrophy  Ext: no pretibial edema, pedal pulses 2+= bilaterally Skin: warm and dry, no  rash Neuro:  Strength and sensation are intact Psych: euthymic mood, full affect  EKG:  EKG is not ordered today.  Recent Labs: No results found for requested labs within last 8760 hours.   Lipid Panel     Component Value Date/Time   CHOL 247 (H) 08/18/2015 1600   TRIG 73 08/18/2015 1600   HDL 108 08/18/2015 1600   CHOLHDL 2.3 08/18/2015 1600   VLDL 15 08/18/2015 1600   LDLCALC 124 08/18/2015 1600      Wt Readings from Last 3 Encounters:  08/21/17 143 lb 8 oz (65.1 kg)  02/08/17 149 lb 6.4 oz (67.8 kg)  11/23/16 146 lb (66.2 kg)     Cardiac Studies Reviewed: 2D Echo 08-17-2017: Left ventricle:  The cavity size was normal. There was mild concentric hypertrophy. Systolic function was normal. The estimated ejection fraction was in the range of 55% to 60%. Wall motion was normal; there were no regional wall motion abnormalities. Doppler parameters are consistent with abnormal left ventricular relaxation (grade 1 diastolic dysfunction). There was no evidence of elevated ventricular filling pressure by Doppler parameters.  ------------------------------------------------------------------- Aortic valve:  S/P AVR with a 21 mm Magna Ease bioprosthetic valve. Mobility was not restricted.  Doppler:   There was mild stenosis. There was no regurgitation.    VTI ratio of LVOT to aortic valve: 0.33. Valve area (VTI): 0.84 cm^2. Indexed valve area (VTI): 0.48 cm^2/m^2. Peak velocity ratio of LVOT to aortic valve: 0.32. Valve area (Vmax): 0.82 cm^2. Indexed valve area (Vmax): 0.47 cm^2/m^2. Mean velocity ratio of LVOT to aortic valve: 0.35. Valve area (Vmean): 0.88 cm^2. Indexed valve area (Vmean): 0.5 cm^2/m^2. Mean gradient (S): 23 mm Hg. Peak gradient (S): 40 mm Hg.  ------------------------------------------------------------------- Aorta:  Aortic root: The aortic root was normal in size.  ------------------------------------------------------------------- Mitral valve:    Structurally normal valve.   Mobility was not restricted.  Doppler:  Transvalvular velocity was within the normal range. There was no evidence for stenosis. There was mild regurgitation.  ------------------------------------------------------------------- Left atrium:  The atrium was normal in size.  ------------------------------------------------------------------- Right ventricle:  The cavity size was normal. Wall thickness was normal. Systolic function was normal.  ------------------------------------------------------------------- Pulmonic valve:    Structurally normal valve.   Cusp separation was normal.  Doppler:  Transvalvular velocity was within the normal range. There was no evidence for stenosis. There was no regurgitation.  ------------------------------------------------------------------- Tricuspid valve:   Structurally normal valve.    Doppler: Transvalvular velocity was within the normal range. There was no regurgitation.  ------------------------------------------------------------------- Pulmonary artery:   The main pulmonary artery was normal-sized. Systolic pressure was within the normal range.  ------------------------------------------------------------------- Right atrium:  The atrium was normal in size.  ------------------------------------------------------------------- Pericardium:  There was no pericardial effusion.  ------------------------------------------------------------------- Systemic veins: Inferior vena cava: The vessel was normal in size.  ------------------------------------------------------------------- Measurements   Left ventricle  Value          Reference  LV ID, ED, PLAX chordal           (L)     41.8  mm       43 - 52  LV ID, ES, PLAX chordal                   25.7  mm       23 - 38  LV fx shortening, PLAX chordal            39    %        >=29  LV PW thickness, ED                       8.5    mm       ---------  IVS/LV PW ratio, ED                       1.08           <=1.3  Stroke volume, 2D                         50    ml       ---------  Stroke volume/bsa, 2D                     28    ml/m^2   ---------  LV e&', lateral                            6.31  cm/s     ---------  LV E/e&', lateral                          10.68          ---------  LV e&', medial                             5.33  cm/s     ---------  LV E/e&', medial                           12.65          ---------  LV e&', average                            5.82  cm/s     ---------  LV E/e&', average                          11.58          ---------    Ventricular septum                        Value          Reference  IVS thickness, ED                         9.19  mm       ---------    LVOT  Value          Reference  LVOT ID, S                                18    mm       ---------  LVOT area                                 2.54  cm^2     ---------  LVOT peak velocity, S                     98.3  cm/s     ---------  LVOT mean velocity, S                     77    cm/s     ---------  LVOT VTI, S                               19.7  cm       ---------    Aortic valve                              Value          Reference  Aortic valve peak velocity, S             303   cm/s     ---------  Aortic valve mean velocity, S             222   cm/s     ---------  Aortic valve VTI, S                       59.6  cm       ---------  Aortic mean gradient, S                   23    mm Hg    ---------  Aortic peak gradient, S                   37    mm Hg    ---------  VTI ratio, LVOT/AV                        0.33           ---------  Aortic valve area, VTI                    0.84  cm^2     ---------  Aortic valve area/bsa, VTI                0.48  cm^2/m^2 ---------  Velocity ratio, peak, LVOT/AV             0.32           ---------  Aortic valve area, peak velocity          0.82  cm^2      ---------  Aortic valve area/bsa, peak               0.47  cm^2/m^2 ---------  velocity  Velocity ratio, mean, LVOT/AV  0.35           ---------  Aortic valve area, mean velocity          0.88  cm^2     ---------  Aortic valve area/bsa, mean               0.5   cm^2/m^2 ---------  velocity    Aorta                                     Value          Reference  Aortic root ID, ED                        27    mm       ---------  Ascending aorta ID, A-P, S                36    mm       ---------    Left atrium                               Value          Reference  LA ID, A-P, ES                            30    mm       ---------  LA ID/bsa, A-P                            1.7   cm/m^2   <=2.2  LA volume, S                              31.1  ml       ---------  LA volume/bsa, S                          17.6  ml/m^2   ---------  LA volume, ES, 1-p A4C                    30.1  ml       ---------  LA volume/bsa, ES, 1-p A4C                17.1  ml/m^2   ---------  LA volume, ES, 1-p A2C                    32    ml       ---------  LA volume/bsa, ES, 1-p A2C                18.2  ml/m^2   ---------    Mitral valve                              Value          Reference  Mitral E-wave peak velocity               67.4  cm/s     ---------  Mitral A-wave peak velocity  95.1  cm/s     ---------  Mitral deceleration time          (H)     306   ms       150 - 230  Mitral E/A ratio, peak                    0.7            ---------    Tricuspid valve                           Value          Reference  Tricuspid regurg peak velocity            214   cm/s     ---------  Tricuspid peak RV-RA gradient             18    mm Hg    ---------    Right atrium                              Value          Reference  RA ID, S-I, ES                            36.3  mm       ---------  RA ID, M-L, ES, A4C                       30.9  mm       30 - 46    Right ventricle                            Value          Reference  RV ID, major axis, ED, A4C        (L)     32.5  mm       55 - 91  TAPSE                                     13.4  mm       ---------  RV s&', lateral, S                         10.4  cm/s     ---------  ASSESSMENT AND PLAN: Aortic valve disease status post bioprosthetic aortic valve replacement: Recent echocardiogram is reviewed.  LV function has completely normalized.  She has discontinued her beta-blocker and now just takes aspirin 81 mg daily.  The patient's transaortic gradient is mildly elevated compared to her previous echo, with a mean gradient increasing from 16 to 23 mmHg.  However, her LVEF has also improved and I suspect some of the changes related to increased flow.  She has minimal symptoms and is encouraged to continue with her walking program.  There is no sign of volume overload.  I plan to repeat an echo for her one-year follow-up visit so that we can trend her bioprosthetic valve function over time.  She will call in the interim if any problems arise.  Current medicines are reviewed with the patient today.  The patient does not have concerns regarding medicines.  Labs/ tests ordered today include:  No orders of the defined types were placed in this encounter.   Disposition:   FU one year with an echo prior to the office visit  Signed, Tonny Bollman, MD  08/21/2017 3:38 PM    [facility] [address] [place], [place], [place]  [address] Phone: [phone]; Fax: [phone]

## 2017-08-21 NOTE — Patient Instructions (Signed)
Medication Instructions:  Your provider recommends that you continue on your current medications as directed. Please refer to the Current Medication list given to you today.    Labwork: None  Testing/Procedures: Your provider has requested that you have an echocardiogram in 1 year. Echocardiography is a painless test that uses sound waves to create images of your heart. It provides your doctor with information about the size and shape of your heart and how well your heart's chambers and valves are working. This procedure takes approximately one hour. There are no restrictions for this procedure.  Follow-Up: Your provider wants you to follow-up in: 1 year with Dr. Cooper. You will receive a reminder letter in the mail two months in advance. If you don't receive a letter, please call our office to schedule the follow-up appointment.    Any Other Special Instructions Will Be Listed Below (If Applicable).     If you need a refill on your cardiac medications before your next appointment, please call your pharmacy.   

## 2017-11-27 ENCOUNTER — Encounter: Payer: Self-pay | Admitting: Obstetrics & Gynecology

## 2017-11-27 LAB — COLOGUARD

## 2018-01-30 ENCOUNTER — Ambulatory Visit: Payer: Medicare Other | Admitting: Obstetrics & Gynecology

## 2018-01-30 NOTE — Progress Notes (Deleted)
67 y.o. G3P2 Married White or Caucasian female here for annual exam.    Patient's last menstrual period was 08/12/2004.          Sexually active: {yes no:314532}  The current method of family planning is {contraception:315051}.    Exercising: {yes no:314532}  {types:19826} Smoker:  {YES [id number]  Health Maintenance: Pap:  11/23/16 Neg. HR HPV:neg   06/09/14 Neg  History of abnormal Pap:  {YES NO:22349} MMG:  06/20/13 BIRADS2:benign  Colonoscopy:  *** BMD:   *** TDaP:  2015 Pneumonia vaccine(s):  *** Shingrix:   *** Hep C testing: *** Screening Labs: ***, Hb today: ***, Urine today: ***   reports that she has quit smoking. She has never used smokeless tobacco. She reports that she drinks alcohol. She reports that she does not use drugs.  Past Medical History:  Diagnosis Date  . Anxiety   . Arthritis   . Chronic systolic heart failure (HCC) 05/25/2016  . Depression   . Migraine   . Persistent atrial fibrillation (HCC) 05/13/2016   Post AVR // Amiodarone // Coumadin started; possible duration 6-8 weeks  . PVC's (premature ventricular contractions) 05/25/2016   Holter 11/17 The basic rhythm is sinus. There are frequent PVCs, periods of ventricular bigeminy, and occasional ventricular couplets and triplets.  . S/P AVR (aortic valve replacement)     Past Surgical History:  Procedure Laterality Date  . AORTIC VALVE REPLACEMENT N/A 05/03/2016   Procedure: AORTIC VALVE REPLACEMENT (AVR) WITH MAGNA EASE PERICARDIAL BIOPROSTHESIS - AORTIC 21 MM MODEL 3300TFX;  Surgeon: [name], MD;  Location: [facility] OR;  Service: Open Heart Surgery;  Laterality: N/A;  . TEE WITHOUT CARDIOVERSION N/A 05/03/2016   Procedure: TRANSESOPHAGEAL ECHOCARDIOGRAM (TEE);  Surgeon: [name], MD;  Location: [facility] OR;  Service: Open Heart Surgery;  Laterality: N/A;  . TONSILLECTOMY AND ADENOIDECTOMY      Current Outpatient Medications  Medication Sig Dispense Refill  . amphetamine-dextroamphetamine  (ADDERALL) 20 MG tablet Take 20 mg by mouth daily. Reported on 08/18/2015    . aspirin EC 81 MG EC tablet Take 1 tablet (81 mg total) by mouth daily.    Marland Kitchen LORazepam (ATIVAN) 0.5 MG tablet Take 0.5 mg by mouth daily as needed for anxiety.      No current facility-administered medications for this visit.     Family History  Problem Relation Age of Onset  . Diabetes Mother   . Ovarian cancer Maternal Grandmother 80  . Breast cancer Sister 83  . Congestive Heart Failure Maternal Grandfather   . Heart disease Brother        valve replacement  . Deep vein thrombosis Brother   . Lung cancer Brother   . Deep vein thrombosis Father   . Deep vein thrombosis Paternal Aunt        x2  . Deep vein thrombosis Paternal Grandmother     Review of Systems  Exam:   LMP 08/12/2004   Height:      Ht Readings from Last 3 Encounters:  08/21/17 5\' 4"  (1.626 m)  02/08/17 5\' 4"  (1.626 m)  11/23/16 5' 4.5" (1.638 m)    General appearance: alert, cooperative and appears stated age Head: Normocephalic, without obvious abnormality, atraumatic Neck: no adenopathy, supple, symmetrical, trachea midline and thyroid {EXAM; THYROID:18604} Lungs: clear to auscultation bilaterally Breasts: {Exam; breast:13139::"normal appearance, no masses or tenderness"} Heart: regular rate and rhythm Abdomen: soft, non-tender; bowel sounds normal; no masses,  no organomegaly Extremities: extremities normal, atraumatic, no cyanosis  or edema Skin: Skin color, texture, turgor normal. No rashes or lesions Lymph nodes: Cervical, supraclavicular, and axillary nodes normal. No abnormal inguinal nodes palpated Neurologic: Grossly normal   Pelvic: External genitalia:  no lesions              Urethra:  normal appearing urethra with no masses, tenderness or lesions              Bartholins and Skenes: normal                 Vagina: normal appearing vagina with normal color and discharge, no lesions              Cervix: {exam;  cervix:14595}              Pap taken: {yes no:314532} Bimanual Exam:  Uterus:  {exam; uterus:12215}              Adnexa: {exam; adnexa:12223}               Rectovaginal: Confirms               Anus:  normal sphincter tone, no lesions  Chaperone was present for exam.  A:  Well Woman with normal exam  P:   {plan; gyn:5269::"mammogram","pap smear","return annually or prn"}

## 2018-02-01 ENCOUNTER — Ambulatory Visit (INDEPENDENT_AMBULATORY_CARE_PROVIDER_SITE_OTHER): Payer: Medicare Other | Admitting: Cardiovascular Disease

## 2018-02-01 ENCOUNTER — Encounter: Payer: Self-pay | Admitting: Cardiovascular Disease

## 2018-02-01 VITALS — BP 102/64 | HR 69 | Ht 64.0 in | Wt 150.1 lb

## 2018-02-01 DIAGNOSIS — I359 Nonrheumatic aortic valve disorder, unspecified: Secondary | ICD-10-CM | POA: Diagnosis not present

## 2018-02-01 DIAGNOSIS — I4819 Other persistent atrial fibrillation: Secondary | ICD-10-CM

## 2018-02-01 LAB — CBC WITH DIFFERENTIAL/PLATELET
BASOS: 2 %
Basophils Absolute: 0.1 10*3/uL (ref 0.0–0.2)
EOS (ABSOLUTE): 0.2 10*3/uL (ref 0.0–0.4)
Eos: 4 %
Hematocrit: 37.3 % (ref 34.0–46.6)
Hemoglobin: 12.7 g/dL (ref 11.1–15.9)
IMMATURE GRANS (ABS): 0 10*3/uL (ref 0.0–0.1)
Immature Granulocytes: 0 %
Lymphocytes Absolute: 1.6 10*3/uL (ref 0.7–3.1)
Lymphs: 27 %
MCH: 30.2 pg (ref 26.6–33.0)
MCHC: 34 g/dL (ref 31.5–35.7)
MCV: 89 fL (ref 79–97)
MONOCYTES: 10 %
Monocytes Absolute: 0.6 10*3/uL (ref 0.1–0.9)
NEUTROS ABS: 3.4 10*3/uL (ref 1.4–7.0)
Neutrophils: 57 %
PLATELETS: 326 10*3/uL (ref 150–450)
RBC: 4.2 x10E6/uL (ref 3.77–5.28)
RDW: 12.2 % — ABNORMAL LOW (ref 12.3–15.4)
WBC: 6 10*3/uL (ref 3.4–10.8)

## 2018-02-01 LAB — BASIC METABOLIC PANEL
BUN/Creatinine Ratio: 22 (ref 12–28)
BUN: 28 mg/dL — AB (ref 8–27)
CALCIUM: 9.4 mg/dL (ref 8.7–10.3)
CO2: 22 mmol/L (ref 20–29)
Chloride: 103 mmol/L (ref 96–106)
Creatinine, Ser: 1.25 mg/dL — ABNORMAL HIGH (ref 0.57–1.00)
GFR, EST AFRICAN AMERICAN: 52 mL/min/{1.73_m2} — AB (ref >59)
GFR, EST NON AFRICAN AMERICAN: 45 mL/min/{1.73_m2} — AB (ref >59)
Glucose: 94 mg/dL (ref 65–99)
Potassium: 4.7 mmol/L (ref 3.5–5.2)
Sodium: 140 mmol/L (ref 134–144)

## 2018-02-01 NOTE — Patient Instructions (Addendum)
Medication Instructions:  Your provider recommends that you continue on your current medications as directed. Please refer to the Current Medication list given to you today.    Labwork: TODAY: BMET, CBC  Testing/Procedures: Your physician has recommended that you have a Cardioversion (DCCV). Electrical Cardioversion uses a jolt of electricity to your heart either through paddles or wired patches attached to your chest. This is a controlled, usually prescheduled, procedure. Defibrillation is done under light anesthesia in the hospital, and you usually go home the day of the procedure. This is done to get your heart back into a normal rhythm. You are not awake for the procedure. Please see the instruction sheet given to you today.  Follow-Up: You have an appointment with Tereso Newcomer, PA on 03/02/2018 at 11:15AM.  Any Other Special Instructions Will Be Listed Below (If Applicable).  Electrical Cardioversion Electrical cardioversion is the delivery of a jolt of electricity to restore a normal rhythm to the heart. A rhythm that is too fast or is not regular keeps the heart from pumping well. In this procedure, sticky patches or metal paddles are placed on the chest to deliver electricity to the heart from a device. This procedure may be done in an emergency if:  There is low or no blood pressure as a result of the heart rhythm.  Normal rhythm must be restored as fast as possible to protect the brain and heart from further damage.  It may save a life.  This procedure may also be done for irregular or fast heart rhythms that are not immediately life-threatening. Tell a health care provider about:  Any allergies you have.  All medicines you are taking, including vitamins, herbs, eye drops, creams, and over-the-counter medicines.  Any problems you or family members have had with anesthetic medicines.  Any blood disorders you have.  Any surgeries you have had.  Any medical conditions you  have.  Whether you are pregnant or may be pregnant. What are the risks? Generally, this is a safe procedure. However, problems may occur, including:  Allergic reactions to medicines.  A blood clot that breaks free and travels to other parts of your body.  The possible return of an abnormal heart rhythm within hours or days after the procedure.  Your heart stopping (cardiac arrest). This is rare.  What happens before the procedure? Medicines  Your health care provider may have you start taking: ? Blood-thinning medicines (anticoagulants) so your blood does not clot as easily. ? Medicines may be given to help stabilize your heart rate and rhythm.  Ask your health care provider about changing or stopping your regular medicines. This is especially important if you are taking diabetes medicines or blood thinners. General instructions  Plan to have someone take you home from the hospital or clinic.  If you will be going home right after the procedure, plan to have someone with you for 24 hours.  Follow instructions from your health care provider about eating or drinking restrictions. What happens during the procedure?  To lower your risk of infection: ? Your health care team will wash or sanitize their hands. ? Your skin will be washed with soap.  An IV tube will be inserted into one of your veins.  You will be given a medicine to help you relax (sedative).  Sticky patches (electrodes) or metal paddles may be placed on your chest.  An electrical shock will be delivered. The procedure may vary among health care providers and hospitals. What happens after  the procedure?  Your blood pressure, heart rate, breathing rate, and blood oxygen level will be monitored until the medicines you were given have worn off.  Do not drive for 24 hours if you were given a sedative.  Your heart rhythm will be watched to make sure it does not change. This information is not intended to replace  advice given to you by your health care provider. Make sure you discuss any questions you have with your health care provider. Document Released: 02/18/2002 Document Revised: 10/28/2015 Document Reviewed: 09/04/2015 Elsevier Interactive Patient Education  2017 [employer].

## 2018-02-01 NOTE — H&P (View-Only) (Signed)
Cardiology Office Note:    Date:  02/01/2018   ID:  Wanda [name], DOB 1950-12-07, MRN [id number]  PCP:  [place], [name], MD  Cardiologist:  Tonny Bollman, MD  Electrophysiologist:  None   Referring MD: [place], [name], MD   Chief Complaint  Patient presents with  . Shortness of Breath    History of Present Illness:    Wanda [name] is a 67 y.o. female with a hx of aortic valve disease, presenting for hospital follow-up evaluation after recent hospitalization for atrial fibrillation with RVR.  The patient is here with her husband and daughter today.  She had been feeling weak and short of breath for 1 to 2 weeks.  [place] the [place] on [place] and went for an outpatient visit where she was noted to be in atrial fibrillation with RVR.  She was hospitalized for 4 to 5 days and treated with IV diltiazem for rate control, also started on apixaban for anticoagulation.  She is now been taking apixaban for 8 days without missing any doses.  She was discharged on a combination of long-acting diltiazem and metoprolol tartrate.  She continues to have shortness of breath with activity.  She denies chest pain or pressure.  She was also told she had "fluid on the lungs" when she was in the hospital, treated with IV diuretics.  States that her breathing has improved but she continues to have limitation with exertional dyspnea.  She is had no dizziness or presyncope.  She is had no further leg swelling.  She has had orthopnea but no PND.  Past Medical History:  Diagnosis Date  . Anxiety   . Arthritis   . Chronic systolic heart failure (HCC) 05/25/2016  . Depression   . Migraine   . Persistent atrial fibrillation 05/13/2016   Post AVR // Amiodarone // Coumadin started; possible duration 6-8 weeks  . PVC's (premature ventricular contractions) 05/25/2016   Holter 11/17 The basic rhythm is sinus. There are frequent PVCs, periods of ventricular bigeminy, and occasional ventricular couplets and triplets.    . S/P AVR (aortic valve replacement)     Past Surgical History:  Procedure Laterality Date  . AORTIC VALVE REPLACEMENT N/A 05/03/2016   Procedure: AORTIC VALVE REPLACEMENT (AVR) WITH MAGNA EASE PERICARDIAL BIOPROSTHESIS - AORTIC 21 MM MODEL 3300TFX;  Surgeon: [name], MD;  Location: [facility] OR;  Service: Open Heart Surgery;  Laterality: N/A;  . TEE WITHOUT CARDIOVERSION N/A 05/03/2016   Procedure: TRANSESOPHAGEAL ECHOCARDIOGRAM (TEE);  Surgeon: [name], MD;  Location: [facility] OR;  Service: Open Heart Surgery;  Laterality: N/A;  . TONSILLECTOMY AND ADENOIDECTOMY      Current Medications: Current Meds  Medication Sig  . amphetamine-dextroamphetamine (ADDERALL) 20 MG tablet Take 20 mg by mouth daily. Reported on 08/18/2015  . apixaban (ELIQUIS) 5 MG TABS tablet Take 5 mg by mouth 2 (two) times daily.  Marland Kitchen aspirin EC 81 MG EC tablet Take 1 tablet (81 mg total) by mouth daily.  Marland Kitchen diltiazem (CARDIZEM CD) 240 MG 24 hr capsule Take 1 capsule by mouth daily.  Marland Kitchen LORazepam (ATIVAN) 0.5 MG tablet Take 0.5 mg by mouth daily as needed for anxiety.   . metoprolol tartrate (LOPRESSOR) 50 MG tablet Take 1 tablet by mouth 2 (two) times daily.     Allergies:   Codeine and Vicodin [hydrocodone-acetaminophen]   Social History   Socioeconomic History  . Marital status: Married    Spouse name: Not on file  .  Number of children: Not on file  . Years of education: Not on file  . Highest education level: Not on file  Occupational History  . Not on file  Social Needs  . Financial resource strain: Not on file  . Food insecurity:    Worry: Not on file    Inability: Not on file  . Transportation needs:    Medical: Not on file    Non-medical: Not on file  Tobacco Use  . Smoking status: Former Games developer  . Smokeless tobacco: Never Used  . Tobacco comment: in college  Substance and Sexual Activity  . Alcohol use: Yes    Comment: 1-2 glasses of wine  . Drug use: No  . Sexual activity: Yes     Partners: Male    Birth control/protection: Other-see comments, Post-menopausal    Comment: vasectomy  Lifestyle  . Physical activity:    Days per week: Not on file    Minutes per session: Not on file  . Stress: Not on file  Relationships  . Social connections:    Talks on phone: Not on file    Gets together: Not on file    Attends religious service: Not on file    Active member of club or organization: Not on file    Attends meetings of clubs or organizations: Not on file    Relationship status: Not on file  Other Topics Concern  . Not on file  Social History Narrative  . Not on file     Family History: The patient's family history includes Breast cancer (age of onset: 5) in her sister; Congestive Heart Failure in her maternal grandfather; Deep vein thrombosis in her brother, father, paternal aunt, and paternal grandmother; Diabetes in her mother; Heart disease in her brother; Lung cancer in her brother; Ovarian cancer (age of onset: 36) in her maternal grandmother.  ROS:   Please see the history of present illness.    All other systems reviewed and are negative.  EKGs/Labs/Other Studies Reviewed:    The following studies were reviewed today: 2D echocardiogram August 17, 2017: Study Conclusions  - Left ventricle: The cavity size was normal. There was mild   concentric hypertrophy. Systolic function was normal. The   estimated ejection fraction was in the range of 55% to 60%. Wall   motion was normal; there were no regional wall motion   abnormalities. Doppler parameters are consistent with abnormal   left ventricular relaxation (grade 1 diastolic dysfunction). - Aortic valve: S/P AVR with a 21 mm Magna Ease bioprosthetic   valve. There was mild stenosis. Mean gradient (S): 23 mm Hg.   Valve area (VTI): 0.84 cm^2. Valve area (Vmax): 0.82 cm^2. Valve   area (Vmean): 0.88 cm^2. - Mitral valve: There was mild regurgitation. - Right ventricle: Systolic function was normal. -  Right atrium: The atrium was normal in size. - Tricuspid valve: There was no regurgitation. - Pericardium, extracardiac: There was no pericardial effusion.  Impressions:  - S/P AVR with a 21 mm Magna Ease bioprosthetic valve. When   compared to the prior study from 08/10/2016 LVEF has improved from   40-45% to 55-60%, transaortic gradient are now mildly elevated   with peak/mean gradients 40/23 mmHg (previously 26/16 mmHg).  EKG:  EKG is ordered today.  The ekg ordered today demonstrates atrial fibrillation 69 bpm, left axis deviation, ST and T wave abnormality consider lateral ischemia.  Recent Labs: No results found for requested labs within last 8760 hours.  Recent  Lipid Panel    Component Value Date/Time   CHOL 247 (H) 08/18/2015 1600   TRIG 73 08/18/2015 1600   HDL 108 08/18/2015 1600   CHOLHDL 2.3 08/18/2015 1600   VLDL 15 08/18/2015 1600   LDLCALC 124 08/18/2015 1600    Physical Exam:    VS:  BP 102/64   Pulse 69   Ht 5\' 4"  (1.626 m)   Wt 150 lb 1.9 oz (68.1 kg)   LMP 08/12/2004   SpO2 97%   BMI 25.77 kg/m     Wt Readings from Last 3 Encounters:  02/01/18 150 lb 1.9 oz (68.1 kg)  08/21/17 143 lb 8 oz (65.1 kg)  02/08/17 149 lb 6.4 oz (67.8 kg)     GEN: Well nourished, well developed in no acute distress HEENT: Normal NECK: No JVD; No carotid bruits LYMPHATICS: No lymphadenopathy CARDIAC: Irregularly irregular with grade 2/6 systolic ejection murmur at the right upper sternal border RESPIRATORY:  Clear to auscultation without rales, wheezing or rhonchi  ABDOMEN: Soft, non-tender, non-distended MUSCULOSKELETAL:  No edema; No deformity  SKIN: Warm and dry NEUROLOGIC:  Alert and oriented x 3 PSYCHIATRIC:  Normal affect   ASSESSMENT:    1. Persistent atrial fibrillation   2. Aortic valve disease    PLAN:    In order of problems listed above:  1. The patient continues to have symptomatic atrial fibrillation.  She is tolerating apixaban.  We reviewed  treatment strategies.  I recommended elective cardioversion after she has 3 weeks of therapeutic anticoagulation.  I reviewed risks, indications, and alternatives with the patient and her family.  They agree to proceed.  If she has recurrent atrial fibrillation, we will need to consider antiarrhythmic drug therapy likely with either dronedarone or tikosyn.  2. The patient's most recent echocardiogram from June 2019 demonstrated increased transaortic gradients, but she was also noted to have normalization of LV function on this study.  Plans for a repeat echocardiogram in 1 year.  She did have an echo study during her hospitalization and we have sent for records.  Medication Adjustments/Labs and Tests Ordered: Current medicines are reviewed at length with the patient today.  Concerns regarding medicines are outlined above.  Orders Placed This Encounter  Procedures  . Basic metabolic panel  . CBC with Differential/Platelet  . EKG 12-Lead   No orders of the defined types were placed in this encounter.   Patient Instructions  Medication Instructions:  Your provider recommends that you continue on your current medications as directed. Please refer to the Current Medication list given to you today.    Labwork: TODAY: BMET, CBC  Testing/Procedures: Your physician has recommended that you have a Cardioversion (DCCV). Electrical Cardioversion uses a jolt of electricity to your heart either through paddles or wired patches attached to your chest. This is a controlled, usually prescheduled, procedure. Defibrillation is done under light anesthesia in the hospital, and you usually go home the day of the procedure. This is done to get your heart back into a normal rhythm. You are not awake for the procedure. Please see the instruction sheet given to you today.  Follow-Up: You have an appointment with Tereso Newcomer, PA on 03/02/2018 at 11:15AM.  Any Other Special Instructions Will Be Listed Below (If  Applicable).  Electrical Cardioversion Electrical cardioversion is the delivery of a jolt of electricity to restore a normal rhythm to the heart. A rhythm that is too fast or is not regular keeps the heart from pumping well. In  this procedure, sticky patches or metal paddles are placed on the chest to deliver electricity to the heart from a device. This procedure may be done in an emergency if:  There is low or no blood pressure as a result of the heart rhythm.  Normal rhythm must be restored as fast as possible to protect the brain and heart from further damage.  It may save a life.  This procedure may also be done for irregular or fast heart rhythms that are not immediately life-threatening. Tell a health care provider about:  Any allergies you have.  All medicines you are taking, including vitamins, herbs, eye drops, creams, and over-the-counter medicines.  Any problems you or family members have had with anesthetic medicines.  Any blood disorders you have.  Any surgeries you have had.  Any medical conditions you have.  Whether you are pregnant or may be pregnant. What are the risks? Generally, this is a safe procedure. However, problems may occur, including:  Allergic reactions to medicines.  A blood clot that breaks free and travels to other parts of your body.  The possible return of an abnormal heart rhythm within hours or days after the procedure.  Your heart stopping (cardiac arrest). This is rare.  What happens before the procedure? Medicines  Your health care provider may have you start taking: ? Blood-thinning medicines (anticoagulants) so your blood does not clot as easily. ? Medicines may be given to help stabilize your heart rate and rhythm.  Ask your health care provider about changing or stopping your regular medicines. This is especially important if you are taking diabetes medicines or blood thinners. General instructions  Plan to have someone take  you home from the hospital or clinic.  If you will be going home right after the procedure, plan to have someone with you for 24 hours.  Follow instructions from your health care provider about eating or drinking restrictions. What happens during the procedure?  To lower your risk of infection: ? Your health care team will wash or sanitize their hands. ? Your skin will be washed with soap.  An IV tube will be inserted into one of your veins.  You will be given a medicine to help you relax (sedative).  Sticky patches (electrodes) or metal paddles may be placed on your chest.  An electrical shock will be delivered. The procedure may vary among health care providers and hospitals. What happens after the procedure?  Your blood pressure, heart rate, breathing rate, and blood oxygen level will be monitored until the medicines you were given have worn off.  Do not drive for 24 hours if you were given a sedative.  Your heart rhythm will be watched to make sure it does not change. This information is not intended to replace advice given to you by your health care provider. Make sure you discuss any questions you have with your health care provider. Document Released: 02/18/2002 Document Revised: 10/28/2015 Document Reviewed: 09/04/2015 Elsevier Interactive Patient Education  2017 [employer]     Signed, Tonny Bollman, MD  02/01/2018 12:03 PM    [facility]

## 2018-02-01 NOTE — Progress Notes (Signed)
Cardiology Office Note:    Date:  02/01/2018   ID:  Wanda [name], DOB 10/07/1950, MRN [id number]  PCP:  Jordan, [name], MD  Cardiologist:  Kashana Breach, MD  Electrophysiologist:  None   Referring MD: Jordan, [name], MD   Chief Complaint  Patient presents with  . Shortness of Breath    History of Present Illness:    Wanda [name] is a 67 y.o. female with a hx of aortic valve disease, presenting for hospital follow-up evaluation after recent hospitalization for atrial fibrillation with RVR.  The patient is here with her husband and daughter today.  She had been feeling weak and short of breath for 1 to 2 weeks.  [place] and went for an outpatient visit where she was noted to be in atrial fibrillation with RVR.  She was hospitalized for 4 to 5 days and treated with IV diltiazem for rate control, also started on apixaban for anticoagulation.  She is now been taking apixaban for 8 days without missing any doses.  She was discharged on a combination of long-acting diltiazem and metoprolol tartrate.  She continues to have shortness of breath with activity.  She denies chest pain or pressure.  She was also told she had "fluid on the lungs" when she was in the hospital, treated with IV diuretics.  States that her breathing has improved but she continues to have limitation with exertional dyspnea.  She is had no dizziness or presyncope.  She is had no further leg swelling.  She has had orthopnea but no PND.  Past Medical History:  Diagnosis Date  . Anxiety   . Arthritis   . Chronic systolic heart failure (HCC) 05/25/2016  . Depression   . Migraine   . Persistent atrial fibrillation 05/13/2016   Post AVR // Amiodarone // Coumadin started; possible duration 6-8 weeks  . PVC's (premature ventricular contractions) 05/25/2016   Holter 11/17 The basic rhythm is sinus. There are frequent PVCs, periods of ventricular bigeminy, and occasional ventricular couplets and triplets.    . S/P AVR (aortic valve replacement)     Past Surgical History:  Procedure Laterality Date  . AORTIC VALVE REPLACEMENT N/A 05/03/2016   Procedure: AORTIC VALVE REPLACEMENT (AVR) WITH MAGNA EASE PERICARDIAL BIOPROSTHESIS - AORTIC 21 MM MODEL 3300TFX;  Surgeon: Edward B Gerhardt, MD;  Location: [facility] OR;  Service: Open Heart Surgery;  Laterality: N/A;  . TEE WITHOUT CARDIOVERSION N/A 05/03/2016   Procedure: TRANSESOPHAGEAL ECHOCARDIOGRAM (TEE);  Surgeon: Edward B Gerhardt, MD;  Location: [facility] OR;  Service: Open Heart Surgery;  Laterality: N/A;  . TONSILLECTOMY AND ADENOIDECTOMY      Current Medications: Current Meds  Medication Sig  . amphetamine-dextroamphetamine (ADDERALL) 20 MG tablet Take 20 mg by mouth daily. Reported on 08/18/2015  . apixaban (ELIQUIS) 5 MG TABS tablet Take 5 mg by mouth 2 (two) times daily.  . aspirin EC 81 MG EC tablet Take 1 tablet (81 mg total) by mouth daily.  . diltiazem (CARDIZEM CD) 240 MG 24 hr capsule Take 1 capsule by mouth daily.  . LORazepam (ATIVAN) 0.5 MG tablet Take 0.5 mg by mouth daily as needed for anxiety.   . metoprolol tartrate (LOPRESSOR) 50 MG tablet Take 1 tablet by mouth 2 (two) times daily.     Allergies:   Codeine and Vicodin [hydrocodone-acetaminophen]   Social History   Socioeconomic History  . Marital status: Married    Spouse name: Not on file  .   Number of children: Not on file  . Years of education: Not on file  . Highest education level: Not on file  Occupational History  . Not on file  Social Needs  . Financial resource strain: Not on file  . Food insecurity:    Worry: Not on file    Inability: Not on file  . Transportation needs:    Medical: Not on file    Non-medical: Not on file  Tobacco Use  . Smoking status: Former Smoker  . Smokeless tobacco: Never Used  . Tobacco comment: in college  Substance and Sexual Activity  . Alcohol use: Yes    Comment: 1-2 glasses of wine  . Drug use: No  . Sexual activity: Yes     Partners: Male    Birth control/protection: Other-see comments, Post-menopausal    Comment: vasectomy  Lifestyle  . Physical activity:    Days per week: Not on file    Minutes per session: Not on file  . Stress: Not on file  Relationships  . Social connections:    Talks on phone: Not on file    Gets together: Not on file    Attends religious service: Not on file    Active member of club or organization: Not on file    Attends meetings of clubs or organizations: Not on file    Relationship status: Not on file  Other Topics Concern  . Not on file  Social History Narrative  . Not on file     Family History: The patient's family history includes Breast cancer (age of onset: 48) in her sister; Congestive Heart Failure in her maternal grandfather; Deep vein thrombosis in her brother, father, paternal aunt, and paternal grandmother; Diabetes in her mother; Heart disease in her brother; Lung cancer in her brother; Ovarian cancer (age of onset: 80) in her maternal grandmother.  ROS:   Please see the history of present illness.    All other systems reviewed and are negative.  EKGs/Labs/Other Studies Reviewed:    The following studies were reviewed today: 2D echocardiogram August 17, 2017: Study Conclusions  - Left ventricle: The cavity size was normal. There was mild   concentric hypertrophy. Systolic function was normal. The   estimated ejection fraction was in the range of 55% to 60%. Wall   motion was normal; there were no regional wall motion   abnormalities. Doppler parameters are consistent with abnormal   left ventricular relaxation (grade 1 diastolic dysfunction). - Aortic valve: S/P AVR with a 21 mm Magna Ease bioprosthetic   valve. There was mild stenosis. Mean gradient (S): 23 mm Hg.   Valve area (VTI): 0.84 cm^2. Valve area (Vmax): 0.82 cm^2. Valve   area (Vmean): 0.88 cm^2. - Mitral valve: There was mild regurgitation. - Right ventricle: Systolic function was normal. -  Right atrium: The atrium was normal in size. - Tricuspid valve: There was no regurgitation. - Pericardium, extracardiac: There was no pericardial effusion.  Impressions:  - S/P AVR with a 21 mm Magna Ease bioprosthetic valve. When   compared to the prior study from 08/10/2016 LVEF has improved from   40-45% to 55-60%, transaortic gradient are now mildly elevated   with peak/mean gradients 40/23 mmHg (previously 26/16 mmHg).  EKG:  EKG is ordered today.  The ekg ordered today demonstrates atrial fibrillation 69 bpm, left axis deviation, ST and T wave abnormality consider lateral ischemia.  Recent Labs: No results found for requested labs within last 8760 hours.  Recent   Lipid Panel    Component Value Date/Time   CHOL 247 (H) 08/18/2015 1600   TRIG 73 08/18/2015 1600   HDL 108 08/18/2015 1600   CHOLHDL 2.3 08/18/2015 1600   VLDL 15 08/18/2015 1600   LDLCALC 124 08/18/2015 1600    Physical Exam:    VS:  BP 102/64   Pulse 69   Ht 5' 4" (1.626 m)   Wt 150 lb 1.9 oz (68.1 kg)   LMP 08/12/2004   SpO2 97%   BMI 25.77 kg/m     Wt Readings from Last 3 Encounters:  02/01/18 150 lb 1.9 oz (68.1 kg)  08/21/17 143 lb 8 oz (65.1 kg)  02/08/17 149 lb 6.4 oz (67.8 kg)     GEN: Well nourished, well developed in no acute distress HEENT: Normal NECK: No JVD; No carotid bruits LYMPHATICS: No lymphadenopathy CARDIAC: Irregularly irregular with grade 2/6 systolic ejection murmur at the right upper sternal border RESPIRATORY:  Clear to auscultation without rales, wheezing or rhonchi  ABDOMEN: Soft, non-tender, non-distended MUSCULOSKELETAL:  No edema; No deformity  SKIN: Warm and dry NEUROLOGIC:  Alert and oriented x 3 PSYCHIATRIC:  Normal affect   ASSESSMENT:    1. Persistent atrial fibrillation   2. Aortic valve disease    PLAN:    In order of problems listed above:  1. The patient continues to have symptomatic atrial fibrillation.  She is tolerating apixaban.  We reviewed  treatment strategies.  I recommended elective cardioversion after she has 3 weeks of therapeutic anticoagulation.  I reviewed risks, indications, and alternatives with the patient and her family.  They agree to proceed.  If she has recurrent atrial fibrillation, we will need to consider antiarrhythmic drug therapy likely with either dronedarone or tikosyn.  2. The patient's most recent echocardiogram from June 2019 demonstrated increased transaortic gradients, but she was also noted to have normalization of LV function on this study.  Plans for a repeat echocardiogram in 1 year.  She did have an echo study during her hospitalization and we have sent for records.  Medication Adjustments/Labs and Tests Ordered: Current medicines are reviewed at length with the patient today.  Concerns regarding medicines are outlined above.  Orders Placed This Encounter  Procedures  . Basic metabolic panel  . CBC with Differential/Platelet  . EKG 12-Lead   No orders of the defined types were placed in this encounter.   Patient Instructions  Medication Instructions:  Your provider recommends that you continue on your current medications as directed. Please refer to the Current Medication list given to you today.    Labwork: TODAY: BMET, CBC  Testing/Procedures: Your physician has recommended that you have a Cardioversion (DCCV). Electrical Cardioversion uses a jolt of electricity to your heart either through paddles or wired patches attached to your chest. This is a controlled, usually prescheduled, procedure. Defibrillation is done under light anesthesia in the hospital, and you usually go home the day of the procedure. This is done to get your heart back into a normal rhythm. You are not awake for the procedure. Please see the instruction sheet given to you today.  Follow-Up: You have an appointment with [name] Weaver, PA on 03/02/2018 at 11:15AM.  Any Other Special Instructions Will Be Listed Below (If  Applicable).  Electrical Cardioversion Electrical cardioversion is the delivery of a jolt of electricity to restore a normal rhythm to the heart. A rhythm that is too fast or is not regular keeps the heart from pumping well. In   this procedure, sticky patches or metal paddles are placed on the chest to deliver electricity to the heart from a device. This procedure may be done in an emergency if:  There is low or no blood pressure as a result of the heart rhythm.  Normal rhythm must be restored as fast as possible to protect the brain and heart from further damage.  It may save a life.  This procedure may also be done for irregular or fast heart rhythms that are not immediately life-threatening. Tell a health care provider about:  Any allergies you have.  All medicines you are taking, including vitamins, herbs, eye drops, creams, and over-the-counter medicines.  Any problems you or family members have had with anesthetic medicines.  Any blood disorders you have.  Any surgeries you have had.  Any medical conditions you have.  Whether you are pregnant or may be pregnant. What are the risks? Generally, this is a safe procedure. However, problems may occur, including:  Allergic reactions to medicines.  A blood clot that breaks free and travels to other parts of your body.  The possible return of an abnormal heart rhythm within hours or days after the procedure.  Your heart stopping (cardiac arrest). This is rare.  What happens before the procedure? Medicines  Your health care provider may have you start taking: ? Blood-thinning medicines (anticoagulants) so your blood does not clot as easily. ? Medicines may be given to help stabilize your heart rate and rhythm.  Ask your health care provider about changing or stopping your regular medicines. This is especially important if you are taking diabetes medicines or blood thinners. General instructions  Plan to have someone take  you home from the hospital or clinic.  If you will be going home right after the procedure, plan to have someone with you for 24 hours.  Follow instructions from your health care provider about eating or drinking restrictions. What happens during the procedure?  To lower your risk of infection: ? Your health care team will wash or sanitize their hands. ? Your skin will be washed with soap.  An IV tube will be inserted into one of your veins.  You will be given a medicine to help you relax (sedative).  Sticky patches (electrodes) or metal paddles may be placed on your chest.  An electrical shock will be delivered. The procedure may vary among health care providers and hospitals. What happens after the procedure?  Your blood pressure, heart rate, breathing rate, and blood oxygen level will be monitored until the medicines you were given have worn off.  Do not drive for 24 hours if you were given a sedative.  Your heart rhythm will be watched to make sure it does not change. This information is not intended to replace advice given to you by your health care provider. Make sure you discuss any questions you have with your health care provider. Document Released: 02/18/2002 Document Revised: 10/28/2015 Document Reviewed: 09/04/2015 Elsevier Interactive Patient Education  2017 Elsevier Inc.     Signed, Loyola Santino, MD  02/01/2018 12:03 PM    [facility] 

## 2018-02-05 ENCOUNTER — Telehealth: Payer: Self-pay

## 2018-02-05 MED ORDER — APIXABAN 5 MG PO TABS: 5.0000 mg | ORAL_TABLET | Freq: Two times a day (BID) | ORAL | 11 refills | Status: DC

## 2018-02-05 MED ORDER — METOPROLOL TARTRATE 50 MG PO TABS: 50.0000 mg | ORAL_TABLET | Freq: Two times a day (BID) | ORAL | 11 refills | Status: DC

## 2018-02-05 MED ORDER — DILTIAZEM HCL ER COATED BEADS 240 MG PO CP24: 240.0000 mg | ORAL_CAPSULE | Freq: Every day | ORAL | 11 refills | Status: DC

## 2018-02-05 NOTE — Telephone Encounter (Signed)
-----   Message from Tonny Bollman, MD sent at 02/02/2018  9:22 AM EST ----- Pre-cardioversion labs - acceptable. Not on any diuretic Rx but has recently received lasix.

## 2018-02-05 NOTE — Telephone Encounter (Signed)
The patient has been notified of the result and verbalized understanding.   Refills called in. Patient was grateful for assistance.

## 2018-02-16 ENCOUNTER — Encounter (HOSPITAL_COMMUNITY): Payer: Self-pay | Admitting: *Deleted

## 2018-02-16 ENCOUNTER — Ambulatory Visit (HOSPITAL_COMMUNITY)
Admission: RE | Admit: 2018-02-16 | Discharge: 2018-02-16 | Disposition: A | Discharge status: Home / Self Care | Payer: Medicare Other | Source: Ambulatory Visit | Attending: Cardiology | Admitting: Cardiology

## 2018-02-16 ENCOUNTER — Encounter (HOSPITAL_COMMUNITY)
Admission: RE | Disposition: A | Discharge status: Home / Self Care | Payer: Self-pay | Source: Ambulatory Visit | Attending: Cardiology

## 2018-02-16 ENCOUNTER — Ambulatory Visit (HOSPITAL_COMMUNITY): Payer: Medicare Other | Admitting: Certified Registered Nurse Anesthetist

## 2018-02-16 DIAGNOSIS — R0602 Shortness of breath: Secondary | ICD-10-CM | POA: Insufficient documentation

## 2018-02-16 DIAGNOSIS — F329 Major depressive disorder, single episode, unspecified: Secondary | ICD-10-CM | POA: Insufficient documentation

## 2018-02-16 DIAGNOSIS — I4819 Other persistent atrial fibrillation: Secondary | ICD-10-CM | POA: Insufficient documentation

## 2018-02-16 DIAGNOSIS — I5022 Chronic systolic (congestive) heart failure: Secondary | ICD-10-CM | POA: Insufficient documentation

## 2018-02-16 DIAGNOSIS — F419 Anxiety disorder, unspecified: Secondary | ICD-10-CM | POA: Diagnosis not present

## 2018-02-16 DIAGNOSIS — Z7901 Long term (current) use of anticoagulants: Secondary | ICD-10-CM | POA: Insufficient documentation

## 2018-02-16 DIAGNOSIS — I447 Left bundle-branch block, unspecified: Secondary | ICD-10-CM | POA: Diagnosis not present

## 2018-02-16 DIAGNOSIS — Z885 Allergy status to narcotic agent status: Secondary | ICD-10-CM | POA: Diagnosis not present

## 2018-02-16 DIAGNOSIS — M1990 Unspecified osteoarthritis, unspecified site: Secondary | ICD-10-CM | POA: Diagnosis not present

## 2018-02-16 DIAGNOSIS — Z87891 Personal history of nicotine dependence: Secondary | ICD-10-CM | POA: Insufficient documentation

## 2018-02-16 DIAGNOSIS — Z9889 Other specified postprocedural states: Secondary | ICD-10-CM | POA: Insufficient documentation

## 2018-02-16 DIAGNOSIS — Z79899 Other long term (current) drug therapy: Secondary | ICD-10-CM | POA: Diagnosis not present

## 2018-02-16 DIAGNOSIS — I069 Rheumatic aortic valve disease, unspecified: Secondary | ICD-10-CM | POA: Diagnosis not present

## 2018-02-16 DIAGNOSIS — Z7982 Long term (current) use of aspirin: Secondary | ICD-10-CM | POA: Insufficient documentation

## 2018-02-16 DIAGNOSIS — Z8249 Family history of ischemic heart disease and other diseases of the circulatory system: Secondary | ICD-10-CM | POA: Diagnosis not present

## 2018-02-16 DIAGNOSIS — Z952 Presence of prosthetic heart valve: Secondary | ICD-10-CM | POA: Insufficient documentation

## 2018-02-16 DIAGNOSIS — I491 Atrial premature depolarization: Secondary | ICD-10-CM | POA: Insufficient documentation

## 2018-02-16 DIAGNOSIS — I4891 Unspecified atrial fibrillation: Secondary | ICD-10-CM

## 2018-02-16 HISTORY — PX: CARDIOVERSION: SHX1299

## 2018-02-16 LAB — POCT I-STAT 4, (NA,K, GLUC, HGB,HCT)
Glucose, Bld: 98 mg/dL (ref 70–99)
HCT: 38 % (ref 36.0–46.0)
Hemoglobin: 12.9 g/dL (ref 12.0–15.0)
Potassium: 4.3 mmol/L (ref 3.5–5.1)
Sodium: 142 mmol/L (ref 135–145)

## 2018-02-16 SURGERY — CARDIOVERSION
Anesthesia: General

## 2018-02-16 MED ORDER — PROPOFOL 10 MG/ML IV BOLUS
INTRAVENOUS | Status: DC | PRN
  Administered 2018-02-16: 70 mg via INTRAVENOUS

## 2018-02-16 MED ORDER — LIDOCAINE HCL (CARDIAC) PF 100 MG/5ML IV SOSY
PREFILLED_SYRINGE | INTRAVENOUS | Status: DC | PRN
  Administered 2018-02-16: 60 mg via INTRAVENOUS

## 2018-02-16 MED ORDER — SODIUM CHLORIDE 0.9 % IV SOLN
INTRAVENOUS | Status: DC | PRN
  Administered 2018-02-16: 10:00:00 via INTRAVENOUS

## 2018-02-16 NOTE — Interval H&P Note (Signed)
History and Physical Interval Note:  02/16/2018 8:09 AM  Wanda [name]  has presented today for surgery, with the diagnosis of AFIB  The various methods of treatment have been discussed with the patient and family. After consideration of risks, benefits and other options for treatment, the patient has consented to  Procedure(s): CARDIOVERSION (N/A) as a surgical intervention .  The patient's history has been reviewed, patient examined, no change in status, stable for surgery.  I have reviewed the patient's chart and labs.  Questions were answered to the patient's satisfaction.     [name]

## 2018-02-16 NOTE — Anesthesia Postprocedure Evaluation (Signed)
Anesthesia Post Note  Patient: Wanda [name]  Procedure(s) Performed: CARDIOVERSION (N/A )     Patient location during evaluation: Endoscopy Anesthesia Type: General Level of consciousness: awake Pain management: pain level controlled Vital Signs Assessment: post-procedure vital signs reviewed and stable Respiratory status: spontaneous breathing Cardiovascular status: stable Postop Assessment: no apparent nausea or vomiting Anesthetic complications: no    Last Vitals:  Vitals:   02/16/18 1010 02/16/18 1015  BP: 105/69 (!) 123/53  Pulse: (!) 55 (!) 54  Resp: 19 15  Temp:    SpO2: 100% 100%    Last Pain:  Vitals:   02/16/18 1015  TempSrc:   PainSc: 0-No pain   Pain Goal: Patients Stated Pain Goal: 2 (02/16/18 0829)               Caren Macadam

## 2018-02-16 NOTE — Progress Notes (Signed)
Venipuncture performed to recheck istat.

## 2018-02-16 NOTE — Discharge Instructions (Signed)
Electrical Cardioversion, Care After °This sheet gives you information about how to care for yourself after your procedure. Your health care provider may also give you more specific instructions. If you have problems or questions, contact your health care provider. °What can I expect after the procedure? °After the procedure, it is common to have: °· Some redness on the skin where the shocks were given. ° °Follow these instructions at home: °· Do not drive for 24 hours if you were given a medicine to help you relax (sedative). °· Take over-the-counter and prescription medicines only as told by your health care provider. °· Ask your health care provider how to check your pulse. Check it often. °· Rest for 48 hours after the procedure or as told by your health care provider. °· Avoid or limit your caffeine use as told by your health care provider. °Contact a health care provider if: °· You feel like your heart is beating too quickly or your pulse is not regular. °· You have a serious muscle cramp that does not go away. °Get help right away if: °· You have discomfort in your chest. °· You are dizzy or you feel faint. °· You have trouble breathing or you are short of breath. °· Your speech is slurred. °· You have trouble moving an arm or leg on one side of your body. °· Your fingers or toes turn cold or blue. °This information is not intended to replace advice given to you by your health care provider. Make sure you discuss any questions you have with your health care provider. °Document Released: 12/19/2012 Document Revised: 10/02/2015 Document Reviewed: 09/04/2015 °Elsevier Interactive Patient Education © 2018 Elsevier Inc. ° °

## 2018-02-16 NOTE — Anesthesia Procedure Notes (Addendum)
Procedure Name: General with mask airway Date/Time: 02/16/2018 9:50 AM Performed by: Jodell Cipro, CRNA Pre-anesthesia Checklist: Patient identified, Emergency Drugs available, Suction available, Patient being monitored and Timeout performed Oxygen Delivery Method: Ambu bag Preoxygenation: Pre-oxygenation with 100% oxygen Ventilation: Mask ventilation without difficulty Dental Injury: Teeth and Oropharynx as per pre-operative assessment

## 2018-02-16 NOTE — Anesthesia Preprocedure Evaluation (Signed)
Anesthesia Evaluation  Patient identified by MRN, date of birth, ID band Patient awake    Reviewed: Allergy & Precautions, NPO status , Patient's Chart, lab work & pertinent test results, reviewed documented beta blocker date and time   Airway Mallampati: I       Dental no notable dental hx. (+) Teeth Intact   Pulmonary neg pulmonary ROS, former smoker,    Pulmonary exam normal breath sounds clear to auscultation       Cardiovascular + dysrhythmias Atrial Fibrillation  Rhythm:Irregular Rate:Normal     Neuro/Psych PSYCHIATRIC DISORDERS Anxiety    GI/Hepatic negative GI ROS, Neg liver ROS,   Endo/Other  negative endocrine ROS  Renal/GU negative Renal ROS     Musculoskeletal   Abdominal Normal abdominal exam  (+)   Peds  Hematology negative hematology ROS (+)   Anesthesia Other Findings Wanda [name]  ECHO COMPLETE WO IMAGING ENHANCING AGENT  Order# [id number]  Reading physician: Lars Masson, MD Ordering physician: Tonny Bollman, MD Study date: 08/17/17 Result Notes for ECHOCARDIOGRAM COMPLETE   Notes recorded by Henrietta Dine, RN on 08/21/2017 at 4:08 PM EDT Reviewed at OV today. Repeat echo ordered to be scheduled in 1 year with [facility]. ------  Notes recorded by Tonny Bollman, MD on 08/18/2017 at 5:12 AM EDT Reviewed. LV function has normalized. Transaortic gradients mildly elevated - suspect change related to increased flow/improved LV function. Repeat echo 1 year   Study Result   Result status: Final result                          [facility]                        [address]                        [place], [place] [address]                            [phone]  ------------------------------------------------------------------- Transthoracic Echocardiography  Patient:    Wanda [name], Wanda [name] MR #:       [id number] Study Date: 08/17/2017 Gender:     F Age:        67 Height:      162.6 cm Weight:     67.8 kg BSA:        1.76 m^2 Pt. Status: Room:   SONOGRAPHER  Junious Dresser, RDCS  ATTENDING    Tonny Bollman, MD  ORDERING     Tonny Bollman, MD  REFERRING    Tonny Bollman, MD  PERFORMING   [facility], Outpatient  cc:  ------------------------------------------------------------------- LV EF: 55% -   60%     Reproductive/Obstetrics                             Anesthesia Physical Anesthesia Plan  ASA: II  Anesthesia Plan: General   Post-op Pain Management:    Induction:   PONV Risk Score and Plan: 3 and Treatment may vary due to age or medical condition  Airway Management Planned: Natural Airway and Simple Face Mask  Additional Equipment:   Intra-op Plan:   Post-operative Plan:   Informed Consent: I have reviewed the patients History and Physical, chart, labs and discussed the procedure including the risks, benefits and alternatives for  the proposed anesthesia with the patient or authorized representative who has indicated his/her understanding and acceptance.     Plan Discussed with: CRNA  Anesthesia Plan Comments:         Anesthesia Quick Evaluation

## 2018-02-16 NOTE — Transfer of Care (Signed)
Immediate Anesthesia Transfer of Care Note  Patient: Wanda [name]  Procedure(s) Performed: CARDIOVERSION (N/A )  Patient Location: Cath Lab  Anesthesia Type:General  Level of Consciousness: awake, alert  and oriented  Airway & Oxygen Therapy: Patient Spontanous Breathing and Patient connected to nasal cannula oxygen  Post-op Assessment: Report given to RN, Post -op Vital signs reviewed and stable and Patient moving all extremities  Post vital signs: Reviewed and stable  Last Vitals:  Vitals Value Taken Time  BP    Temp    Pulse 59 02/16/2018 10:03 AM  Resp 16 02/16/2018 10:03 AM  SpO2 99 % 02/16/2018 10:03 AM  Vitals shown include unvalidated device data.  Last Pain:  Vitals:   02/16/18 0829  TempSrc: Oral  PainSc: 6       Patients Stated Pain Goal: 2 (02/16/18 0829)  Complications: No apparent anesthesia complications

## 2018-02-16 NOTE — CV Procedure (Signed)
    Cardioversion Note  Wanda [name] [id number] [birth year]  Procedure: DC Cardioversion Indications: atrial fibrillation  Procedure Details Consent: Obtained Time Out: Verified patient identification, verified procedure, site/side was marked, verified correct patient position, special equipment/implants available, Radiology Safety Procedures followed,  medications/allergies/relevent history reviewed, required imaging and test results available.  Performed  The patient has been on adequate anticoagulation.  The patient received IV propofol for deep sedation administered by anesthesia staff.  Synchronous cardioversion was performed at 200 joules.  The cardioversion was successful however it took 4 attempts, 120 and 150 J with no response, the first cardioversion with 200 J only lasted 10-15 beats, the second resulted in SR.  Complications: No apparent complications Patient did tolerate procedure well.   [name], MD, [facility] 02/16/2018, 9:59 AM

## 2018-02-19 ENCOUNTER — Encounter (HOSPITAL_COMMUNITY): Payer: Self-pay | Admitting: Cardiology

## 2018-03-02 ENCOUNTER — Ambulatory Visit (INDEPENDENT_AMBULATORY_CARE_PROVIDER_SITE_OTHER): Payer: Medicare Other | Admitting: Physician Assistant

## 2018-03-02 ENCOUNTER — Encounter: Payer: Self-pay | Admitting: Physician Assistant

## 2018-03-02 VITALS — Ht 64.0 in | Wt 153.8 lb

## 2018-03-02 DIAGNOSIS — I4819 Other persistent atrial fibrillation: Secondary | ICD-10-CM

## 2018-03-02 DIAGNOSIS — I5042 Chronic combined systolic (congestive) and diastolic (congestive) heart failure: Secondary | ICD-10-CM | POA: Diagnosis not present

## 2018-03-02 DIAGNOSIS — Z952 Presence of prosthetic heart valve: Secondary | ICD-10-CM

## 2018-03-02 MED ORDER — METOPROLOL TARTRATE 25 MG PO TABS: 25.0000 mg | ORAL_TABLET | Freq: Two times a day (BID) | ORAL | 3 refills | Status: DC

## 2018-03-02 MED ORDER — FUROSEMIDE 20 MG PO TABS: 20.0000 mg | ORAL_TABLET | Freq: Every day | ORAL | 3 refills | Status: DC | PRN

## 2018-03-02 NOTE — Progress Notes (Signed)
Cardiology Office Note:    Date:  03/02/2018   ID:  Wanda [name], DOB June 23, 1950, MRN [id number]  PCP:  [place], [name], MD  Cardiologist:  Tonny Bollman, MD   Electrophysiologist:  None   Referring MD: [place], [name], MD   Chief Complaint  Patient presents with  . Hospitalization Follow-up    s/p outpatient DCCV     History of Present Illness:    Wanda [name] is a 67 y.o. female with aortic insufficiency, combined systolic and diastolic CHF, non-ischemic dilated CM.   She underwent bioprosthetic AVR by Dr. Tyrone Sage in February 2018.  Postoperative course was complicated by atrial fibrillation with a rapid rate requiring IV amiodarone.  Echo in June 2019 demonstrated improved LV function with an EF of 55-60% and mild diastolic dysfunction.  She was seen by Dr. Excell Seltzer 02/01/18 after an admission to the hospital in [facility] with atrial fibrillation with rapid rate complicated by congestive heart failure.  She was started on Apixaban for anticoagulation and diltiazem and metoprolol for rate control.  Echo done in the hospital in [place], [place] demonstrated an EF of 45-50 and a normally functioning aortic valve prosthesis.  Elective cardioversion was arranged after 3 weeks of uninterrupted anticoagulation.  She underwent cardioversion on 02/16/2018 with restoration of normal sinus rhythm (after 4 attempts-120 J, 150 J, 200 J x 2).  Wanda [name] returns for follow-up on atrial fibrillation status post recent cardioversion.  She is here alone.  She felt better for a few days after her cardioversion.  However, over the last few days, she has been more short of breath and fatigued.  She also notes dizziness with standing.  She was concerned that she may be back in atrial fibrillation.  She has not had orthopnea, paroxysmal internal dyspnea or lower extremity swelling.  She has had gradual weight gain over the past month.   CHADS2-VASc=3 (female, age x1, CHF).    Prior CV studies:   The  following studies were reviewed today:  Echocardiogram 01/23/2018 [facility], [place], [place]) Mild concentric LVH, EF 45-50, normally functioning bioprosthetic aortic valve, mean gradient 12, peak gradient 17, mild MR, moderate LAE  Echo 08/17/2017 Mild concentric LVH, EF 55-60, normal wall motion, grade 1 diastolic dysfunction, normally functioning AVR with mild aortic stenosis (mean 23), mild MR  Echo 08/10/2016 Diffuse hypokinesis, moderate LVH, EF 40-45, normal diastolic function, mild MR  Intraop TEE 05/03/16 EF 35-40, severe AI, mild MR  Carotid US 2/18 No ICA stenosis  R/L [facility] 1/18 LAD irregs prox 20 Normal R heart hemodynamics  Holter 11/17 The basic rhythm is sinus. There are frequent PVCs, periods of ventricular bigeminy, and occasional ventricular couplets and triplets.  Echo 11/17 EF 40-45, mild diff HK, Gr 2 DD, possibly bicuspid AV with very mild AS and severe AI, mild MR, mild LAE  Past Medical History:  Diagnosis Date  . Anxiety   . Arthritis   . Chronic systolic heart failure (HCC) 05/25/2016  . Depression   . Migraine   . Persistent atrial fibrillation 05/13/2016   Post AVR // Amiodarone // Coumadin started; possible duration 6-8 weeks  . PVC's (premature ventricular contractions) 05/25/2016   Holter 11/17 The basic rhythm is sinus. There are frequent PVCs, periods of ventricular bigeminy, and occasional ventricular couplets and triplets.  . S/P AVR (aortic valve replacement)    Surgical Hx: The patient  has a past surgical history that includes Tonsillectomy and adenoidectomy; Aortic valve replacement (  N/A, 05/03/2016); TEE without cardioversion (N/A, 05/03/2016); and Cardioversion (N/A, 02/16/2018).   Current Medications: Current Meds  Medication Sig  . acetaminophen (TYLENOL) 500 MG tablet Take 500-1,000 mg by mouth every 6 (six) hours as needed (for pain.).  Marland Kitchen apixaban (ELIQUIS) 5 MG TABS tablet Take 1 tablet (5 mg total) by mouth 2 (two)  times daily.  Marland Kitchen diltiazem (CARDIZEM CD) 240 MG 24 hr capsule Take 1 capsule (240 mg total) by mouth daily.  Marland Kitchen LORazepam (ATIVAN) 0.5 MG tablet Take 0.5 mg by mouth daily as needed for anxiety.   . [DISCONTINUED] amphetamine-dextroamphetamine (ADDERALL) 20 MG tablet Take 20 mg by mouth daily. Reported on 08/18/2015  . [DISCONTINUED] metoprolol tartrate (LOPRESSOR) 50 MG tablet Take 1 tablet (50 mg total) by mouth 2 (two) times daily.     Allergies:   Codeine and Vicodin [hydrocodone-acetaminophen]   Social History   Tobacco Use  . Smoking status: Former Games developer  . Smokeless tobacco: Never Used  . Tobacco comment: in college  Substance Use Topics  . Alcohol use: Yes    Comment: 1-2 glasses of wine  . Drug use: No     Family Hx: The patient's family history includes Breast cancer (age of onset: 41) in her sister; Congestive Heart Failure in her maternal grandfather; Deep vein thrombosis in her brother, father, paternal aunt, and paternal grandmother; Diabetes in her mother; Heart disease in her brother; Lung cancer in her brother; Ovarian cancer (age of onset: 42) in her maternal grandmother.  ROS:   Please see the history of present illness.    Review of Systems  Respiratory: Positive for shortness of breath.    All other systems reviewed and are negative.   EKGs/Labs/Other Test Reviewed:    EKG:  EKG is  ordered today.  The ekg ordered today demonstrates atrial fibrillation, HR 72  Recent Labs: 02/01/2018: BUN 28; Creatinine, Ser 1.25; Platelets 326 02/16/2018: Hemoglobin 12.9; Potassium 4.3; Sodium 142   Recent Lipid Panel Lab Results  Component Value Date/Time   CHOL 247 (H) 08/18/2015 04:00 PM   TRIG 73 08/18/2015 04:00 PM   HDL 108 08/18/2015 04:00 PM   CHOLHDL 2.3 08/18/2015 04:00 PM   LDLCALC 124 08/18/2015 04:00 PM    Physical Exam:    VS:  Ht 5\' 4"  (1.626 m)   Wt 153 lb 12.8 oz (69.8 kg)   LMP 08/12/2004   SpO2 97%   BMI 26.40 kg/m     Orthostatic VS for  the past 24 hrs (Last 3 readings):  BP- Lying Pulse- Lying BP- Sitting Pulse- Sitting  03/02/18 1210 106/74 63 107/75 63     Wt Readings from Last 3 Encounters:  03/02/18 153 lb 12.8 oz (69.8 kg)  02/01/18 150 lb 1.9 oz (68.1 kg)  08/21/17 143 lb 8 oz (65.1 kg)     Physical Exam  Constitutional: She is oriented to person, place, and time. She appears well-developed and well-nourished. No distress.  HENT:  Head: Normocephalic and atraumatic.  Neck: Neck supple. No JVD present. No thyromegaly present.  Cardiovascular: Normal rate, S1 normal, S2 normal and normal heart sounds. An irregularly irregular rhythm present.  Pulmonary/Chest: Effort normal. She has no rales.  Abdominal: Soft. There is no hepatomegaly.  Musculoskeletal:        General: No edema.  Lymphadenopathy:    She has no cervical adenopathy.  Neurological: She is alert and oriented to person, place, and time.  Skin: Skin is warm and dry.  Psychiatric: She  has a normal mood and affect.    ASSESSMENT & PLAN:    Persistent atrial fibrillation She is symptomatic with atrial fibrillation.  Heart rate is currently controlled.  Her blood pressure is running somewhat low and she is symptomatic with her low blood pressure.  She did not tolerate amiodarone well after her aortic valve replacement.  With her structural heart disease, she is not likely a good candidate for flecainide.  I suspect she would be a good candidate for Tikosyn.  Previous QTC while she was in normal rhythm was 417 ms.  I will refer her to the atrial fibrillation clinic for consideration of Tikosyn.  I will decrease her metoprolol to 25 mg twice daily to help with her symptomatic hypotension.  Continue current dose of diltiazem, Apixaban.  Chronic combined systolic and diastolic heart failure (HCC)  EF had improved back to normal.  Echocardiogram during her hospitalization in [place] demonstrated EF of 45-50%.  She really does not look volume overloaded  on exam.  She sometimes feels swollen.  I will give her Lasix 20 mg to take once daily as needed.  She should contact our office if she takes more than 2 days in a row.  S/P AVR (aortic valve replacement)  Normally functioning aortic valve prosthesis by most recent echocardiogram.  Continue SBE prophylaxis.  Dispo:   Refer to AFib Clinic  Medication Adjustments/Labs and Tests Ordered: Current medicines are reviewed at length with the patient today.  Concerns regarding medicines are outlined above.  Tests Ordered: Orders Placed This Encounter  Procedures  . EKG 12-Lead   Medication Changes: Meds ordered this encounter  Medications  . metoprolol tartrate (LOPRESSOR) 25 MG tablet    Sig: Take 1 tablet (25 mg total) by mouth 2 (two) times daily.    Dispense:  180 tablet    Refill:  3  . furosemide (LASIX) 20 MG tablet    Sig: Take 1 tablet (20 mg total) by mouth daily as needed for edema.    Dispense:  90 tablet    Refill:  3    Signed, Tereso NewcomerScott Melana Hingle, PA-C  03/02/2018 2:11 PM    [facility] [address], [place], [place]  [address] Phone: [phone]; Fax: [phone]

## 2018-03-02 NOTE — Patient Instructions (Addendum)
Medication Instructions:  Your physician has recommended you make the following change in your medication:  1. DECREASE METOPROLOL TO 25 MG TWICE DAILY.  2. START LASIX 20 MG DAILY AS NEEDED FOR SWELLING.  If you need a refill on your cardiac medications before your next appointment, please call your pharmacy.   Lab work: NONE If you have labs (blood work) drawn today and your tests are completely normal, you will receive your results only by: Marland Kitchen MyChart Message (if you have MyChart) OR . A paper copy in the mail If you have any lab test that is abnormal or we need to change your treatment, we will call you to review the results.  Testing/Procedures: NONE  Follow-Up: At [facility], you and your health needs are our priority.  As part of our continuing mission to provide you with exceptional heart care, we have created designated Provider Care Teams.  These Care Teams include your primary Cardiologist (physician) and Advanced Practice Providers (APPs -  Physician Assistants and Nurse Practitioners) who all work together to provide you with the care you need, when you need it.  . YOU HAVE A APPOINTMENT AT THE AFIB CLINIC ON Monday, 23 RD @ 8:45 AM  Any Other Special Instructions Will Be Listed Below (If Applicable). IF YOU HAVE SWELLING FOR 3 DAYS OR MORE PLEASE CALL OUR OFFICE AT [phone]

## 2018-03-05 ENCOUNTER — Encounter (HOSPITAL_COMMUNITY): Payer: Self-pay | Admitting: Nurse Practitioner

## 2018-03-05 ENCOUNTER — Ambulatory Visit (HOSPITAL_COMMUNITY)
Admission: RE | Admit: 2018-03-05 | Discharge: 2018-03-05 | Disposition: A | Discharge status: Home / Self Care | Payer: Medicare Other | Source: Ambulatory Visit | Attending: Nurse Practitioner | Admitting: Nurse Practitioner

## 2018-03-05 VITALS — BP 108/78 | HR 104 | Ht 64.0 in | Wt 151.0 lb

## 2018-03-05 DIAGNOSIS — R9431 Abnormal electrocardiogram [ECG] [EKG]: Secondary | ICD-10-CM | POA: Diagnosis not present

## 2018-03-05 DIAGNOSIS — Z885 Allergy status to narcotic agent status: Secondary | ICD-10-CM | POA: Diagnosis not present

## 2018-03-05 DIAGNOSIS — Z952 Presence of prosthetic heart valve: Secondary | ICD-10-CM | POA: Insufficient documentation

## 2018-03-05 DIAGNOSIS — I4819 Other persistent atrial fibrillation: Secondary | ICD-10-CM | POA: Diagnosis present

## 2018-03-05 DIAGNOSIS — I5040 Unspecified combined systolic (congestive) and diastolic (congestive) heart failure: Secondary | ICD-10-CM | POA: Diagnosis not present

## 2018-03-05 DIAGNOSIS — M1990 Unspecified osteoarthritis, unspecified site: Secondary | ICD-10-CM | POA: Insufficient documentation

## 2018-03-05 DIAGNOSIS — Z833 Family history of diabetes mellitus: Secondary | ICD-10-CM | POA: Diagnosis not present

## 2018-03-05 DIAGNOSIS — I351 Nonrheumatic aortic (valve) insufficiency: Secondary | ICD-10-CM | POA: Diagnosis not present

## 2018-03-05 DIAGNOSIS — Z87891 Personal history of nicotine dependence: Secondary | ICD-10-CM | POA: Diagnosis not present

## 2018-03-05 DIAGNOSIS — I420 Dilated cardiomyopathy: Secondary | ICD-10-CM | POA: Diagnosis not present

## 2018-03-05 DIAGNOSIS — F419 Anxiety disorder, unspecified: Secondary | ICD-10-CM | POA: Diagnosis not present

## 2018-03-05 DIAGNOSIS — I447 Left bundle-branch block, unspecified: Secondary | ICD-10-CM | POA: Insufficient documentation

## 2018-03-05 NOTE — Progress Notes (Signed)
Primary Care Physician: Wanda [name], Wanda G, MD Referring Physician: Dr. Jonathon Jordan Knights is a 67 y.o. female with a h/o  aortic insufficiency, combined systolic and diastolic CHF, non-ischemic dilated CM.  She underwent bioprosthetic AVR by Dr. Tyrone Sage in February 2018.  Postoperative course was complicated by atrial fibrillation with a rapid rate requiring IV amiodarone.  Echo in June 2019 demonstrated improved LV function with an EF of 55-60% and mild diastolic   She was seen by Dr. Excell Seltzer 02/01/18 after an admission to the hospital in [facility] with atrial fibrillation with rapid rate complicated by congestive heart failure.  She was started on Apixaban for anticoagulation and diltiazem and metoprolol for rate control.  Echo done in the hospital in [place], [place] demonstrated an EF of 45-50 and a normally functioning aortic valve prosthesis.  Elective cardioversion was arranged after 3 weeks of uninterrupted anticoagulation.  She underwent cardioversion on 02/16/2018 with restoration of normal sinus rhythm (after 4 attempts-120 J, 150 J, 200 J x 2).  She had f/u with Tereso Newcomer, PA on 12/20. She felt better for a few days but went back into afib with feeling more fatigued and short of breath.  She is now in the afib clinic, 12/23, to discuss options to restore SR. She does not feel terrible in afib but better in SR. She is dealing with a lot of stress with substance abuse in her son and her concerns for his health. He is currently living with the pt and her husband and going thru detox. She feels this is what made her go back in  afib so quickly. [name] thought that Tikosyn may be a good option but pt will have to check on cost of drug.  Today, she denies symptoms of palpitations, chest pain, shortness of breath, orthopnea, PND, lower extremity edema, dizziness, presyncope, syncope, or neurologic sequela. The patient is tolerating medications without difficulties and is otherwise  without complaint today.   Past Medical History:  Diagnosis Date  . Anxiety   . Arthritis   . Chronic systolic heart failure (HCC) 05/25/2016  . Depression   . Migraine   . Persistent atrial fibrillation 05/13/2016   Post AVR // Amiodarone // Coumadin started; possible duration 6-8 weeks  . PVC's (premature ventricular contractions) 05/25/2016   Holter 11/17 The basic rhythm is sinus. There are frequent PVCs, periods of ventricular bigeminy, and occasional ventricular couplets and triplets.  . S/P AVR (aortic valve replacement)    Past Surgical History:  Procedure Laterality Date  . AORTIC VALVE REPLACEMENT N/A 05/03/2016   Procedure: AORTIC VALVE REPLACEMENT (AVR) WITH MAGNA EASE PERICARDIAL BIOPROSTHESIS - AORTIC 21 MM MODEL 3300TFX;  Surgeon: [name], MD;  Location: [facility] OR;  Service: Open Heart Surgery;  Laterality: N/A;  . CARDIOVERSION N/A 02/16/2018   Procedure: CARDIOVERSION;  Surgeon: Lars Masson, MD;  Location: [facility] ENDOSCOPY;  Service: Cardiovascular;  Laterality: N/A;  . TEE WITHOUT CARDIOVERSION N/A 05/03/2016   Procedure: TRANSESOPHAGEAL ECHOCARDIOGRAM (TEE);  Surgeon: [name], MD;  [facility];  Service: Open Heart Surgery;  Laterality: N/A;  . TONSILLECTOMY AND ADENOIDECTOMY      Current Outpatient Medications  Medication Sig Dispense Refill  . acetaminophen (TYLENOL) 500 MG tablet Take 500-1,000 mg by mouth every 6 (six) hours as needed (for pain.).    Marland Kitchen apixaban (ELIQUIS) 5 MG TABS tablet Take 1 tablet (5 mg total) by mouth 2 (two) times daily. 60 tablet 11  .  diltiazem (CARDIZEM CD) 240 MG 24 hr capsule Take 1 capsule (240 mg total) by mouth daily. 30 capsule 11  . furosemide (LASIX) 20 MG tablet Take 1 tablet (20 mg total) by mouth daily as needed for edema. 90 tablet 3  . LORazepam (ATIVAN) 0.5 MG tablet Take 0.5 mg by mouth daily as needed for anxiety.     . metoprolol tartrate (LOPRESSOR) 25 MG tablet Take 25 mg by mouth 2 (two) times daily.  Patient states she and provider recently changed this to 0.5 tablets po bid.  She takes half of a 25 mg tablet two times daily.     No current facility-administered medications for this encounter.     Allergies  Allergen Reactions  . Codeine Nausea And Vomiting  . Vicodin [Hydrocodone-Acetaminophen] Nausea And Vomiting    Social History   Socioeconomic History  . Marital status: Married    Spouse name: Not on file  . Number of children: Not on file  . Years of education: Not on file  . Highest education level: Not on file  Occupational History  . Not on file  Social Needs  . Financial resource strain: Not on file  . Food insecurity:    Worry: Not on file    Inability: Not on file  . Transportation needs:    Medical: Not on file    Non-medical: Not on file  Tobacco Use  . Smoking status: Former Games developer  . Smokeless tobacco: Never Used  . Tobacco comment: in college  Substance and Sexual Activity  . Alcohol use: Yes    Comment: 1-2 glasses of wine  . Drug use: No  . Sexual activity: Yes    Partners: Male    Birth control/protection: Other-see comments, Post-menopausal    Comment: vasectomy  Lifestyle  . Physical activity:    Days per week: Not on file    Minutes per session: Not on file  . Stress: Not on file  Relationships  . Social connections:    Talks on phone: Not on file    Gets together: Not on file    Attends religious service: Not on file    Active member of club or organization: Not on file    Attends meetings of clubs or organizations: Not on file    Relationship status: Not on file  . Intimate partner violence:    Fear of current or ex partner: Not on file    Emotionally abused: Not on file    Physically abused: Not on file    Forced sexual activity: Not on file  Other Topics Concern  . Not on file  Social History Narrative  . Not on file    Family History  Problem Relation Age of Onset  . Diabetes Mother   . Ovarian cancer Maternal  Grandmother 80  . Breast cancer Sister 73  . Congestive Heart Failure Maternal Grandfather   . Heart disease Brother        valve replacement  . Deep vein thrombosis Brother   . Lung cancer Brother   . Deep vein thrombosis Father   . Deep vein thrombosis Paternal Aunt        x2  . Deep vein thrombosis Paternal Grandmother     ROS- All systems are reviewed and negative except as per the HPI above  Physical Exam: Vitals:   03/05/18 0853  BP: 108/78  Pulse: (!) 104  SpO2: 96%  Weight: 68.5 kg  Height: 5\' 4"  (1.626 m)  Wt Readings from Last 3 Encounters:  03/05/18 68.5 kg  03/02/18 69.8 kg  02/01/18 68.1 kg    Labs: Lab Results  Component Value Date   NA 142 02/16/2018   K 4.3 02/16/2018   CL 103 02/01/2018   CO2 22 02/01/2018   GLUCOSE 98 02/16/2018   BUN 28 (H) 02/01/2018   CREATININE 1.25 (H) 02/01/2018   CALCIUM 9.4 02/01/2018   MG 2.5 (H) 05/04/2016   Lab Results  Component Value Date   INR 1.8 07/07/2016   Lab Results  Component Value Date   CHOL 247 (H) 08/18/2015   HDL 108 08/18/2015   LDLCALC 124 08/18/2015   TRIG 73 08/18/2015     GEN- The patient is well appearing, alert and oriented x 3 today.   Head- normocephalic, atraumatic Eyes-  Sclera clear, conjunctiva pink Ears- hearing intact Oropharynx- clear Neck- supple, no JVP Lymph- no cervical lymphadenopathy Lungs- Clear to ausculation bilaterally, normal work of breathing Heart-irregular rate and rhythm, no murmurs, rubs or gallops, PMI not laterally displaced GI- soft, NT, ND, + BS Extremities- no clubbing, cyanosis, or edema MS- no significant deformity or atrophy Skin- no rash or lesion Psych- euthymic mood, full affect Neuro- strength and sensation are intact  EKG-afib at 105 bpm, qrs int 118 ms, qtc 494 ms( in SR 438 ms 02/08/17) Epic records    Assessment and Plan: 1. Persistent  afib Successful cardioversion but ERAF Discussed options with pt She did not feel well on  amiodarone Tikosyn/sotaloldiscussed SHe states as upset as she isabout her son's substance abuse issues right now, she is questioning if anything can keep her in rhythm. She is leaning toward  tikosyn and will check on the price of the drug. With Christmas and New years holidays upon us, it would be best to wait until after the first of the year. She will check on the price of tikosyn and call back to discuss and schedule, if price is feasible.  Continue metoprolol at current dose, although not as well rate controlled than with visit with  Lorin PicketScott,  he reduced the dose for hypotension, her BP is not as low and that is helping her to feel better. continue eliquis  without missed doses  Further plans after hearing the price of drug   Lupita LeashDonna C. Matthew Folksarroll, ANP-C [facility] [address] [place], [place] [address] [phone]

## 2018-06-26 ENCOUNTER — Encounter: Payer: Self-pay | Admitting: Cardiovascular Disease

## 2018-06-26 ENCOUNTER — Encounter (HOSPITAL_COMMUNITY): Payer: Self-pay | Admitting: *Deleted

## 2018-06-26 ENCOUNTER — Inpatient Hospital Stay (HOSPITAL_COMMUNITY): Payer: Medicare Other

## 2018-06-26 ENCOUNTER — Inpatient Hospital Stay (HOSPITAL_COMMUNITY)
Admission: AD | Admit: 2018-06-26 | Discharge: 2018-06-30 | DRG: 273 | Disposition: A | Discharge status: Home / Self Care | Payer: Medicare Other | Attending: Cardiovascular Disease | Admitting: Cardiovascular Disease

## 2018-06-26 ENCOUNTER — Other Ambulatory Visit: Payer: Self-pay

## 2018-06-26 DIAGNOSIS — F329 Major depressive disorder, single episode, unspecified: Secondary | ICD-10-CM | POA: Diagnosis present

## 2018-06-26 DIAGNOSIS — Z8249 Family history of ischemic heart disease and other diseases of the circulatory system: Secondary | ICD-10-CM | POA: Diagnosis not present

## 2018-06-26 DIAGNOSIS — R0602 Shortness of breath: Secondary | ICD-10-CM | POA: Diagnosis present

## 2018-06-26 DIAGNOSIS — I340 Nonrheumatic mitral (valve) insufficiency: Secondary | ICD-10-CM | POA: Diagnosis not present

## 2018-06-26 DIAGNOSIS — I420 Dilated cardiomyopathy: Secondary | ICD-10-CM | POA: Diagnosis present

## 2018-06-26 DIAGNOSIS — Z803 Family history of malignant neoplasm of breast: Secondary | ICD-10-CM

## 2018-06-26 DIAGNOSIS — I493 Ventricular premature depolarization: Secondary | ICD-10-CM | POA: Diagnosis present

## 2018-06-26 DIAGNOSIS — Z8041 Family history of malignant neoplasm of ovary: Secondary | ICD-10-CM

## 2018-06-26 DIAGNOSIS — N183 Chronic kidney disease, stage 3 (moderate): Secondary | ICD-10-CM | POA: Diagnosis present

## 2018-06-26 DIAGNOSIS — Z87891 Personal history of nicotine dependence: Secondary | ICD-10-CM | POA: Diagnosis not present

## 2018-06-26 DIAGNOSIS — I4819 Other persistent atrial fibrillation: Secondary | ICD-10-CM | POA: Diagnosis present

## 2018-06-26 DIAGNOSIS — I480 Paroxysmal atrial fibrillation: Secondary | ICD-10-CM | POA: Diagnosis not present

## 2018-06-26 DIAGNOSIS — Z7901 Long term (current) use of anticoagulants: Secondary | ICD-10-CM

## 2018-06-26 DIAGNOSIS — Z801 Family history of malignant neoplasm of trachea, bronchus and lung: Secondary | ICD-10-CM

## 2018-06-26 DIAGNOSIS — I5043 Acute on chronic combined systolic (congestive) and diastolic (congestive) heart failure: Secondary | ICD-10-CM | POA: Diagnosis present

## 2018-06-26 DIAGNOSIS — Z885 Allergy status to narcotic agent status: Secondary | ICD-10-CM

## 2018-06-26 DIAGNOSIS — Z952 Presence of prosthetic heart valve: Secondary | ICD-10-CM

## 2018-06-26 DIAGNOSIS — I361 Nonrheumatic tricuspid (valve) insufficiency: Secondary | ICD-10-CM | POA: Diagnosis not present

## 2018-06-26 DIAGNOSIS — I959 Hypotension, unspecified: Secondary | ICD-10-CM | POA: Diagnosis present

## 2018-06-26 DIAGNOSIS — N179 Acute kidney failure, unspecified: Secondary | ICD-10-CM | POA: Diagnosis present

## 2018-06-26 DIAGNOSIS — Z953 Presence of xenogenic heart valve: Secondary | ICD-10-CM

## 2018-06-26 DIAGNOSIS — Z833 Family history of diabetes mellitus: Secondary | ICD-10-CM

## 2018-06-26 DIAGNOSIS — Z79899 Other long term (current) drug therapy: Secondary | ICD-10-CM

## 2018-06-26 DIAGNOSIS — I4891 Unspecified atrial fibrillation: Secondary | ICD-10-CM

## 2018-06-26 HISTORY — DX: Other cardiomyopathies: I42.8

## 2018-06-26 HISTORY — DX: Heart failure, unspecified: I50.9

## 2018-06-26 LAB — CBC WITH DIFFERENTIAL/PLATELET
Abs Immature Granulocytes: 0.01 10*3/uL (ref 0.00–0.07)
Basophils Absolute: 0.1 10*3/uL (ref 0.0–0.1)
Basophils Relative: 1 %
Eosinophils Absolute: 0.1 10*3/uL (ref 0.0–0.5)
Eosinophils Relative: 2 %
HCT: 40.8 % (ref 36.0–46.0)
Hemoglobin: 13.1 g/dL (ref 12.0–15.0)
Immature Granulocytes: 0 %
Lymphocytes Relative: 24 %
Lymphs Abs: 1.7 10*3/uL (ref 0.7–4.0)
MCH: 30 pg (ref 26.0–34.0)
MCHC: 32.1 g/dL (ref 30.0–36.0)
MCV: 93.6 fL (ref 80.0–100.0)
Monocytes Absolute: 0.5 10*3/uL (ref 0.1–1.0)
Monocytes Relative: 7 %
Neutro Abs: 4.6 10*3/uL (ref 1.7–7.7)
Neutrophils Relative %: 66 %
Platelets: 224 10*3/uL (ref 150–400)
RBC: 4.36 MIL/uL (ref 3.87–5.11)
RDW: 13 % (ref 11.5–15.5)
WBC: 6.9 10*3/uL (ref 4.0–10.5)
nRBC: 0 % (ref 0.0–0.2)

## 2018-06-26 LAB — BASIC METABOLIC PANEL
Anion gap: 9 (ref 5–15)
BUN: 23 mg/dL (ref 8–23)
CO2: 28 mmol/L (ref 22–32)
Calcium: 9.4 mg/dL (ref 8.9–10.3)
Chloride: 104 mmol/L (ref 98–111)
Creatinine, Ser: 1.51 mg/dL — ABNORMAL HIGH (ref 0.44–1.00)
GFR calc Af Amer: 41 mL/min — ABNORMAL LOW (ref >60)
GFR calc non Af Amer: 36 mL/min — ABNORMAL LOW (ref >60)
Glucose, Bld: 110 mg/dL — ABNORMAL HIGH (ref 70–99)
Potassium: 4.1 mmol/L (ref 3.5–5.1)
Sodium: 141 mmol/L (ref 135–145)

## 2018-06-26 LAB — BRAIN NATRIURETIC PEPTIDE: B Natriuretic Peptide: 762.2 pg/mL — ABNORMAL HIGH (ref 0.0–100.0)

## 2018-06-26 LAB — ECHOCARDIOGRAM LIMITED
Height: 64 in
Weight: 2398.4 oz

## 2018-06-26 LAB — MAGNESIUM: Magnesium: 2.2 mg/dL (ref 1.7–2.4)

## 2018-06-26 MED ORDER — NITROGLYCERIN 0.4 MG SL SUBL: 0.4000 mg | SUBLINGUAL_TABLET | SUBLINGUAL | Status: DC | PRN

## 2018-06-26 MED ORDER — ACETAMINOPHEN 325 MG PO TABS
650.0000 mg | ORAL_TABLET | ORAL | Status: DC | PRN
  Administered 2018-06-26 – 2018-06-28 (×4): 650 mg via ORAL
  Filled 2018-06-26 (×3): qty 2

## 2018-06-26 MED ORDER — FUROSEMIDE 10 MG/ML IJ SOLN
40.0000 mg | Freq: Every day | INTRAMUSCULAR | Status: DC
  Administered 2018-06-26 – 2018-06-27 (×2): 40 mg via INTRAVENOUS
  Filled 2018-06-26 (×3): qty 4

## 2018-06-26 MED ORDER — ONDANSETRON HCL 4 MG/2ML IJ SOLN: 4.0000 mg | Freq: Four times a day (QID) | INTRAMUSCULAR | Status: DC | PRN

## 2018-06-26 MED ORDER — METOPROLOL TARTRATE 25 MG PO TABS
25.0000 mg | ORAL_TABLET | Freq: Three times a day (TID) | ORAL | Status: DC
  Administered 2018-06-26 – 2018-06-27 (×3): 25 mg via ORAL
  Filled 2018-06-26 (×6): qty 1

## 2018-06-26 MED ORDER — LORAZEPAM 0.5 MG PO TABS
0.5000 mg | ORAL_TABLET | Freq: Every day | ORAL | Status: DC | PRN
  Administered 2018-06-26 – 2018-06-28 (×3): 0.5 mg via ORAL
  Filled 2018-06-26 (×3): qty 1

## 2018-06-26 MED ORDER — SODIUM CHLORIDE 0.9 % IV SOLN
250.0000 mL | INTRAVENOUS | Status: DC | PRN
  Administered 2018-06-28: 250 mL via INTRAVENOUS

## 2018-06-26 MED ORDER — METOPROLOL TARTRATE 5 MG/5ML IV SOLN
2.5000 mg | INTRAVENOUS | Status: DC | PRN
  Administered 2018-06-26: 2.5 mg via INTRAVENOUS
  Filled 2018-06-26: qty 5

## 2018-06-26 MED ORDER — SODIUM CHLORIDE 0.9% FLUSH
3.0000 mL | Freq: Two times a day (BID) | INTRAVENOUS | Status: DC
  Administered 2018-06-26 – 2018-06-27 (×4): 3 mL via INTRAVENOUS

## 2018-06-26 MED ORDER — SODIUM CHLORIDE 0.9% FLUSH: 3.0000 mL | INTRAVENOUS | Status: DC | PRN

## 2018-06-26 MED ORDER — APIXABAN 5 MG PO TABS
5.0000 mg | ORAL_TABLET | Freq: Two times a day (BID) | ORAL | Status: DC
  Administered 2018-06-26 – 2018-06-30 (×8): 5 mg via ORAL
  Filled 2018-06-26 (×9): qty 1

## 2018-06-26 MED ORDER — FUROSEMIDE 10 MG/ML IJ SOLN: 40.0000 mg | Freq: Once | INTRAMUSCULAR | Status: DC

## 2018-06-26 MED ORDER — DOFETILIDE 125 MCG PO CAPS
125.0000 ug | ORAL_CAPSULE | Freq: Two times a day (BID) | ORAL | Status: DC
  Administered 2018-06-26: 125 ug via ORAL
  Filled 2018-06-26 (×2): qty 1

## 2018-06-26 NOTE — TOC Benefit Eligibility Note (Signed)
Transition of Care (TOC) Benefit Eligibility Note    Patient Details  Name: Wanda [name] MRN: [id number] Date of Birth: 07/13/1950   Medication/Dose: TIKOSYN  125 MCG CAPSULE BID(DOFETILIDE  125 MCG BID CAPSULE   CO-PAY -$ 61.80   P/A-NO )  Covered?: Yes  Tier: 3 Drug  Prescription Coverage Preferred Pharmacy: YES([facility]  AND [facility])  Spoke with Person/Company/Phone Number:: YOLANDA([facility] # [phone])  Co-Pay: $ 61.80  Prior Approval: No  Deductible: Met(OUT-OF-POCKET- NOT MET)       [name], [name] Phone Number: 06/26/2018, 1:46 PM     

## 2018-06-26 NOTE — Progress Notes (Signed)
Patient arrived HR 130-150s, cardiology paged. Will await orders.

## 2018-06-26 NOTE — Progress Notes (Signed)
EKG done, QTc 386

## 2018-06-26 NOTE — Plan of Care (Signed)

## 2018-06-26 NOTE — Discharge Instructions (Addendum)
Post procedure care/activity instructions No driving for 4 days. No lifting over 5 lbs for 1 week. No vigorous or sexual activity for 1 week. You may return to work on 07/05/2018. Keep procedure site clean & dry. If you notice increased pain, swelling, bleeding or pus, call/return!  You may shower, but no soaking baths/hot tubs/pools for 1 week.    PLEASE SIGN UP TO YOUR MY CHART ACCOUNT WHEN YOU GET HOME (if you aren't already).  IN THE CURRENT ENVIRONMENT WITH COVID-19, IN EFFORT TO REDUCE YOUR EXPOSURE WE WILL BE CONDUCTING MANY PATIENT VISITS BY EITHER VIRTUAL/VIDEO VISITS or TELEPHONE VISITS.  BEING SIGNED UP IN YOUR MY CHART ACCOUNT  WILL HELP FACILITATE THESE VISITS AND OUR COMMUNICATION WITH YOU      Information on my medicine - ELIQUIS (apixaban)  Why was Eliquis prescribed for you? Eliquis was prescribed for you to reduce the risk of a blood clot forming that can cause a stroke if you have a medical condition called atrial fibrillation (a type of irregular heartbeat).  What do You need to know about Eliquis ? Take your Eliquis TWICE DAILY - one tablet in the morning and one tablet in the evening with or without food. If you have difficulty swallowing the tablet whole please discuss with your pharmacist how to take the medication safely.  Take Eliquis exactly as prescribed by your doctor and DO NOT stop taking Eliquis without talking to the doctor who prescribed the medication.  Stopping may increase your risk of developing a stroke.  Refill your prescription before you run out.  After discharge, you should have regular check-up appointments with your healthcare provider that is prescribing your Eliquis.  In the future your dose may need to be changed if your kidney function or weight changes by a significant amount or as you get older.  What do you do if you miss a dose? If you miss a dose, take it as soon as you remember on the same day and resume taking twice daily.  Do not  take more than one dose of ELIQUIS at the same time to make up a missed dose.  Important Safety Information A possible side effect of Eliquis is bleeding. You should call your healthcare provider right away if you experience any of the following: ? Bleeding from an injury or your nose that does not stop. ? Unusual colored urine (red or dark brown) or unusual colored stools (red or black). ? Unusual bruising for unknown reasons. ? A serious fall or if you hit your head (even if there is no bleeding).  Some medicines may interact with Eliquis and might increase your risk of bleeding or clotting while on Eliquis. To help avoid this, consult your healthcare provider or pharmacist prior to using any new prescription or non-prescription medications, including herbals, vitamins, non-steroidal anti-inflammatory drugs (NSAIDs) and supplements.  This website has more information on Eliquis (apixaban): http://[email or web address].com/eliquis/home   YOUR CARDIOLOGY TEAM HAS ARRANGED FOR AN E-VISIT FOR YOUR APPOINTMENT - PLEASE REVIEW IMPORTANT INFORMATION BELOW SEVERAL DAYS PRIOR TO YOUR APPOINTMENT  Due to the recent COVID-19 pandemic, we are transitioning in-person office visits to tele-medicine visits in an effort to decrease unnecessary exposure to our patients, their families, and staff. Medicare and most insurances are covering these visits without a copay needed. We also encourage you to sign up for MyChart if you have not already done so. You will need a smartphone if possible. For patients that do not have this, we can still  complete the visit using a regular telephone but do prefer a smartphone to enable video when possible. You may have a family member that lives with you that can help. If possible, we also ask that you have a blood pressure cuff and scale at home to measure your blood pressure, heart rate and weight prior to your scheduled appointment. Patients with clinical needs that need an in-person  evaluation and testing will still be able to come to the office if absolutely necessary. If you have any questions, feel free to call our office.     YOUR PROVIDER WILL BE USING THE FOLLOWING PLATFORM TO COMPLETE YOUR VISIT:  My Chart and/or Doximity    IF USING WEBEX - How to Download the WebEx App to Your SmartPhone  - If Apple device, go to [employer] and type in WebEx in the search bar. Download [employer], the blue/green circle. If Android, go to [employer] and type in [employer] in the search bar. The app is free but as with any other app download, your phone may require you to verify saved payment information or Apple/Android password.  - You do NOT have to create an account. - On the day of the visit, our staff will walk you through joining the meeting with the meeting number/password.   IF USING MYCHART - How to Download the MyChart App to Your SmartPhone   - If Apple, go to [employer] and type in MyChart in the search bar and download the app. If Android, ask patient to go to [employer] and type in [place] in the search bar and download the app. The app is free but as with any other app downloads, your phone may require you to verify saved payment information or Apple/Android password.  - You will need to then log into the app with your MyChart username and password, and select [facility] as your healthcare provider to link the account. When it is time for your visit, go to the MyChart app, find appointments, and click Begin Video Visit. Be sure to Select Allow for your device to access the Microphone and Camera for your visit. You will then be connected, and your provider will be with you shortly.  **If you have any issues connecting or need assistance, please contact MyChart service desk [phone])**  **If using a computer, in order to ensure the best quality for your visit, you will need to use either of the following Internet Browsers:  [employer] or Google Chrome**   IF USING DOXIMITY or [email or web address].ME - The staff will give you instructions on receiving your link to join the meeting the day of your visit.      2-3 DAYS BEFORE YOUR APPOINTMENT  You will receive a telephone call from one of our [facility] team members - your caller ID may say "Unknown caller." If this is a video visit, we will walk you through how to set up your device to be able to complete the visit. We will remind you check your blood pressure, heart rate and weight prior to your scheduled appointment. If you have an Apple Watch or Kardia, please upload any pertinent ECG strips the day before or morning of your appointment to MyChart. Our staff will also make sure you have reviewed the consent and agree to move forward with your scheduled tele-health visit.     THE DAY OF YOUR APPOINTMENT  Approximately 15 minutes prior to your scheduled appointment, you will receive  a telephone call from one of Heart[facility] team - your caller ID may say "Unknown caller."  Our staff will confirm medications, vital signs for the day and any symptoms you may be experiencing. Please have this information available prior to the time of visit start. It may also be helpful for you to have a pad of paper and pen handy for any instructions given during your visit. They will also walk you through joining the smartphone meeting if this is a video visit.    CONSENT FOR TELE-HEALTH VISIT - PLEASE REVIEW  I hereby voluntarily request, consent and authorize [facility] and its employed or contracted physicians, physician assistants, nurse practitioners or other licensed health care professionals (the Practitioner), to provide me with telemedicine health care services (the Services") as deemed necessary by the treating Practitioner. I acknowledge and consent to receive the Services by the Practitioner via telemedicine. I understand that the telemedicine visit will involve communicating with  the Practitioner through live audiovisual communication technology and the disclosure of certain medical information by electronic transmission. I acknowledge that I have been given the opportunity to request an in-person assessment or other available alternative prior to the telemedicine visit and am voluntarily participating in the telemedicine visit.  I understand that I have the right to withhold or withdraw my consent to the use of telemedicine in the course of my care at any time, without affecting my right to future care or treatment, and that the Practitioner or I may terminate the telemedicine visit at any time. I understand that I have the right to inspect all information obtained and/or recorded in the course of the telemedicine visit and may receive copies of available information for a reasonable fee.  I understand that some of the potential risks of receiving the Services via telemedicine include:   Delay or interruption in medical evaluation due to technological equipment failure or disruption;  Information transmitted may not be sufficient (e.g. poor resolution of images) to allow for appropriate medical decision making by the Practitioner; and/or   In rare instances, security protocols could fail, causing a breach of personal health information.  Furthermore, I acknowledge that it is my responsibility to provide information about my medical history, conditions and care that is complete and accurate to the best of my ability. I acknowledge that Practitioner's advice, recommendations, and/or decision may be based on factors not within their control, such as incomplete or inaccurate data provided by me or distortions of diagnostic images or specimens that may result from electronic transmissions. I understand that the practice of medicine is not an exact science and that Practitioner makes no warranties or guarantees regarding treatment outcomes. I acknowledge that I will receive a copy of  this consent concurrently upon execution via email to the email address I last provided but may also request a printed copy by calling the office of [facility].    I understand that my insurance will be billed for this visit.   I have read or had this consent read to me.  I understand the contents of this consent, which adequately explains the benefits and risks of the Services being provided via telemedicine.   I have been provided ample opportunity to ask questions regarding this consent and the Services and have had my questions answered to my satisfaction.  I give my informed consent for the services to be provided through the use of telemedicine in my medical care  By participating in this telemedicine visit I agree to  the above.

## 2018-06-26 NOTE — Progress Notes (Signed)
Benefit check in progress for Tikosyn. Abelino Derrick [facility] [phone]

## 2018-06-26 NOTE — Progress Notes (Signed)
   Vital Signs MEWS/VS Documentation      06/26/2018 0928 06/26/2018 0945       MEWS Score:  3  3     MEWS Score Color:  Yellow  Yellow     Resp:  18  -     Pulse:  (!) 143  -     BP:  106/81  -     Temp:  98.5 F (36.9 C)  -     O2 Device:  Room Air  -             Jeanella Flattery 06/26/2018,10:19 AM

## 2018-06-26 NOTE — Progress Notes (Signed)
Patient BP low, patient asymptomatic. NP paged-metoprolol held, will continue to monitor.

## 2018-06-26 NOTE — Progress Notes (Signed)
Pharmacy Review for Dofetilide (Tikosyn) Initiation  Admit Complaint: 68 y.o. female admitted 06/26/2018 with atrial fibrillation to be initiated on dofetilide.   Assessment:  Patient Exclusion Criteria: If any screening criteria checked as "Yes", then  patient  should NOT receive dofetilide until criteria item is corrected. If "Yes" please indicate correction plan.  YES  NO Patient  Exclusion Criteria Correction Plan  [x]  []  Baseline QTc interval is greater than or equal to 440 msec. IF above YES box checked dofetilide contraindicated unless patient has ICD; then may proceed if QTc 500-550 msec or with known ventricular conduction abnormalities may proceed with QTc 550-600 msec. QTc =  524: MD notes  While her QTC is prolonged, this is likely related to her rapid ventricular rate.  Her QT interval has been normal in the past when she is in sinus rhythm.   []  [x]  Magnesium level is less than 1.8 mEq/l : Last magnesium:  Lab Results  Component Value Date   MG 2.2 06/26/2018         []  [x]  Potassium level is less than 4 mEq/l : Last potassium:  Lab Results  Component Value Date   K 4.1 06/26/2018         []  [x]  Patient is known or suspected to have a digoxin level greater than 2 ng/ml: No results found for: DIGOXIN    []  [x]  Creatinine clearance less than 20 ml/min (calculated using Cockcroft-Gault, actual body weight and serum creatinine): Estimated Creatinine Clearance: 34.7 mL/min (A) (by C-G formula based on SCr of 1.51 mg/dL (H)).    []  [x]  Patient has received drugs known to prolong the QT intervals within the last 48 hours (phenothiazines, tricyclics or tetracyclic antidepressants, erythromycin, H-1 antihistamines, cisapride, fluoroquinolones, azithromycin). Drugs not listed above may have an, as yet, undetected potential to prolong the QT interval, updated information on QT prolonging agents is available at this website:QT prolonging agents   []  [x]  Patient received a dose of  hydrochlorothiazide (Oretic) alone or in any combination including triamterene (Dyazide, Maxzide) in the last 48 hours.   []  [x]  Patient received a medication known to increase dofetilide plasma concentrations prior to initial dofetilide dose:  . Trimethoprim (Primsol, Proloprim) in the last 36 hours . Verapamil (Calan, Verelan) in the last 36 hours or a sustained release dose in the last 72 hours . Megestrol (Megace) in the last 5 days  . Cimetidine (Tagamet) in the last 6 hours . Ketoconazole (Nizoral) in the last 24 hours . Itraconazole (Sporanox) in the last 48 hours  . Prochlorperazine (Compazine) in the last 36 hours    []  [x]  Patient is known to have a history of torsades de pointes; congenital or acquired long QT syndromes.   []  [x]  Patient has received a Class 1 antiarrhythmic with less than 2 half-lives since last dose. (Disopyramide, Quinidine, Procainamide, Lidocaine, Mexiletine, Flecainide, Propafenone)   []  [x]  Patient has received amiodarone therapy in the past 3 months or amiodarone level is greater than 0.3 ng/ml.    Patient has been appropriately anticoagulated with Eliquis.  Ordering provider was confirmed at [email or web address] if they are not listed on the [facility] Authorized Prescribers list.  Goal of Therapy: Follow renal function, electrolytes, potential drug interactions, and dose adjustment. Provide education and 1 week supply at discharge.  Plan:  [x]   Physician selected initial dose within range recommended for patients level of renal function - will monitor for response.  []   Physician selected initial dose outside of range recommended  for patients level of renal function - will discuss if the dose should be altered at this time.   Select One Calculated CrCl  Dose q12h  []  > 60 ml/min 500 mcg  []  40-60 ml/min 250 mcg  [x]  20-40 ml/min 125 mcg   2. Follow up QTc after the first 5 doses, renal function, electrolytes (K & Mg) daily x 3     days, dose adjustment,  success of initiation and facilitate 1 week discharge supply as     clinically indicated.  3. Initiate Tikosyn education video (Call [phone] and ask for Tikosyn Video # 116).  4. Place Enrollment Form on the chart for discharge supply of dofetilide.  Wanda [name] S. Merilynn Finland, PharmD, BCPS Clinical Staff Pharmacist Merilynn Finland, Kaiden Dardis [place]illinger 1:25 PM 06/26/2018

## 2018-06-26 NOTE — Progress Notes (Signed)
  Echocardiogram 2D Echocardiogram has been performed.  Delcie Roch 06/26/2018, 12:12 PM

## 2018-06-26 NOTE — Progress Notes (Unsigned)
Followed up with the patient yesterday after she reached out with worsening symptoms.  The patient has persistent atrial fibrillation and chronic systolic heart failure.  She has developed progressive symptoms now of orthopnea, dizziness, hypotension, and generalized weakness.  She has worsening of her baseline shortness of breath.  She has been feeling very poorly.  She is noted low heart rates and low blood pressures on her ambulatory monitoring.  We have been trying to wait out COVID-19 restrictions in order to bring her in for hospital admission to try to restore normal sinus rhythm with dofetilide.  However, with her progressive symptoms very concerning for progressive heart failure, I have recommended hospital admission for treatment of acute on chronic systolic heart failure as well as management of atrial fibrillation.  I will try to admit her directly to avoid the emergency department in the context of the COVID-19 pandemic.  We will be in touch with her with instructions.  Tonny Bollman 06/26/2018 7:44 AM

## 2018-06-26 NOTE — Progress Notes (Signed)
   Vital Signs MEWS/VS Documentation      06/26/2018 1216 06/26/2018 1600 06/26/2018 1758 06/26/2018 1813   MEWS Score:  3  3  4  3    MEWS Score Color:  Yellow  Yellow  Red  Yellow   Resp:  -  -  16  -   Pulse:  -  (!) 114  (!) 131  (!) 123   BP:  -  (!) 88/75  90/74  -   Temp:  -  97.7 F (36.5 C)  98 F (36.7 C)  -   O2 Device:  -  -  Room Air  -           Jeanella Flattery 06/26/2018,6:13 PM

## 2018-06-26 NOTE — H&P (Signed)
Cardiology Admission History and Physical:   Patient ID: Wanda [name] MRN: [id number]; DOB: 25-Mar-1950   Admission date: 06/26/2018  Primary Care Provider: [place], [name], MD Primary Cardiologist: Tonny Bollman, MD  Primary Electrophysiologist:  None  Chief Complaint:  Progressive shortness of breath, orthopnea, PND, dizziness, and weakness.  Patient Profile:   Wanda [name] is a 68 y.o. female with a history of chronic combined CHF, non-ischemic dilated cardiomyopathy, persistent atrial fibrillation s/p DCCV in 02/2018, aortic insufficiency s/p bioprosthetic AVR by Dr. Tyrone Sage in 04/2016, who is being directly admitted from home for acute on chronic CHF and atrial fibrillation with RVR.  History of Present Illness:   Wanda [name] is a 68 year old female with the above history who is followed by Dr. Excell Seltzer. She underwent bioprosthetic AVR by Dr. Tyrone Sage in February 2018.  Postoperative course was complicated by atrial fibrillation with a rapid rate requiring IV Amiodarone. Echo in June 2019 demonstrated improved LV function with an EF of 55-60% and mild diastolic dysfunction. She was seen by Dr. Excell Seltzer on 02/01/18 after an admission to a hospital in [place] with atrial fibrillation with rapid rate complicated by congestive heart failure. She was started on Apixaban for anticoagulation and Diltiazem and Metoprolol for rate control.  Echo done in the hospital in [place] demonstrated an EF of 45-50% and a normally functioning aortic valve prosthesis.  Elective cardioversion was arranged after 3 weeks of uninterrupted anticoagulation.  She underwent cardioversion on 02/16/2018 with restoration of normal sinus rhythm (after 4 attempts-120 J, 150 J, 200 J x 2). Patient was seen by Tereso Newcomer, PA-C, on 03/03/2019 for follow-up at which time she was noted to be back in atrial fibrillation. She was referred to the Atrial Fibrillation Clinic with plans to start Tikosyn; however, this was  postponed due to the holidays and the current COVID-19 pandemic.  Dr. Excell Seltzer spoke with the patient yesterday after patient reached out with worsening symptoms. Patient has developed progressive symptoms of shortness of breath both at rest and with exertion, orthopnea, dizziness, and generalized weakness. Therefore, decision was made to directly admit the patient.  Given the current COVID-19 pandemic and in an effort to minimize exposure, I called into the patient's room to obtain the history. Patient reports she has baseline shortness of breath when she is in atrial fibrillation but notes it has worsened over the last 2-3 weeks. She also reports recent orthopnea and PND. Patient woke up at 3:30am this morning because she was having such difficulty breathing. Patient also reports some abdominal bloating and nausea as well recently. She notes pain around her ribs and is not sure if this is due to fluid or not. Patient takes Lasix  as needed at home. She states she normally does not need it but over the last 2 days she has taken 4 doses of this with some improvement of her breathing. Patient does note some chest pressure over the last couple of weeks and states it feels like someone is sitting on her chest. She also reports some indigestion and a feeling like she needs to "burp" as well as a few sharp pains in her chest this morning. Patient is also very aware of her atrial fibrillation and reports palpitations. She also reports some dizziness and feeling like she is going to pass out recently but denies any syncope. Patient has an automatic blood pressure machine at home but states whenever she has tried to check her pressure the past few days,  it just says "error."   Patient denies any fever, chills, or body aches. She has had a mild cold recently with has improved some with Lasix. Patient has been self isolating and has had no known exposure to COVID-19.  Past Medical History:  Diagnosis Date    Anxiety    Arthritis    Chronic systolic heart failure (HCC) 05/25/2016   Depression    Migraine    Persistent atrial fibrillation 05/13/2016   Post AVR // Amiodarone // Coumadin started; possible duration 6-8 weeks   PVC's (premature ventricular contractions) 05/25/2016   Holter 11/17 The basic rhythm is sinus. There are frequent PVCs, periods of ventricular bigeminy, and occasional ventricular couplets and triplets.   S/P AVR (aortic valve replacement)     Past Surgical History:  Procedure Laterality Date   AORTIC VALVE REPLACEMENT N/A 05/03/2016   Procedure: AORTIC VALVE REPLACEMENT (AVR) WITH MAGNA EASE PERICARDIAL BIOPROSTHESIS - AORTIC 21 MM MODEL 3300TFX;  Surgeon: [name], MD;  Location: [facility] OR;  Service: Open Heart Surgery;  Laterality: N/A;   CARDIOVERSION N/A 02/16/2018   Procedure: CARDIOVERSION;  Surgeon: Lars Masson, MD;  Location: [facility] ENDOSCOPY;  Service: Cardiovascular;  Laterality: N/A;   TEE WITHOUT CARDIOVERSION N/A 05/03/2016   Procedure: TRANSESOPHAGEAL ECHOCARDIOGRAM (TEE);  Surgeon: [name], MD;  Location: [facility] OR;  Service: Open Heart Surgery;  Laterality: N/A;   TONSILLECTOMY AND ADENOIDECTOMY       Medications Prior to Admission: Prior to Admission medications   Medication Sig Start Date End Date Taking? Authorizing Provider  acetaminophen (TYLENOL) 500 MG tablet Take 500-1,000 mg by mouth every 6 (six) hours as needed (for pain.).    [provider]  apixaban (ELIQUIS) 5 MG TABS tablet Take 1 tablet (5 mg total) by mouth 2 (two) times daily. 02/05/18 01/31/19  Tonny Bollman, MD  diltiazem (CARDIZEM CD) 240 MG 24 hr capsule Take 1 capsule (240 mg total) by mouth daily. 02/05/18 01/31/19  Tonny Bollman, MD  furosemide (LASIX) 20 MG tablet Take 1 tablet (20 mg total) by mouth daily as needed for edema. 03/02/18 05/31/18  Tereso Newcomer T, PA-C  LORazepam (ATIVAN) 0.5 MG tablet Take 0.5 mg by mouth daily as needed for anxiety.   04/29/14   [provider]  metoprolol tartrate (LOPRESSOR) 25 MG tablet Take 25 mg by mouth 2 (two) times daily. Patient states she and provider recently changed this to 0.5 tablets po bid.  She takes half of a 25 mg tablet two times daily.    [provider]     Allergies:    Allergies  Allergen Reactions   Codeine Nausea And Vomiting   Vicodin [Hydrocodone-Acetaminophen] Nausea And Vomiting    Social History:   Social History   Socioeconomic History   Marital status: Married    Spouse name: Not on file   Number of children: Not on file   Years of education: Not on file   Highest education level: Not on file  Occupational History   Not on file  Social Needs   Financial resource strain: Not on file   Food insecurity:    Worry: Not on file    Inability: Not on file   Transportation needs:    Medical: Not on file    Non-medical: Not on file  Tobacco Use   Smoking status: Former Smoker   Smokeless tobacco: Never Used   Tobacco comment: in college  Substance and Sexual Activity   Alcohol use: Yes  Comment: 1-2 glasses of wine   Drug use: No   Sexual activity: Yes    Partners: Male    Birth control/protection: Other-see comments, Post-menopausal    Comment: vasectomy  Lifestyle   Physical activity:    Days per week: Not on file    Minutes per session: Not on file   Stress: Not on file  Relationships   Social connections:    Talks on phone: Not on file    Gets together: Not on file    Attends religious service: Not on file    Active member of club or organization: Not on file    Attends meetings of clubs or organizations: Not on file    Relationship status: Not on file   Intimate partner violence:    Fear of current or ex partner: Not on file    Emotionally abused: Not on file    Physically abused: Not on file    Forced sexual activity: Not on file  Other Topics Concern   Not on file  Social History Narrative   Not on  file    Family History:   The patient's family history includes Breast cancer (age of onset: 38) in her sister; Congestive Heart Failure in her maternal grandfather; Deep vein thrombosis in her brother, father, paternal aunt, and paternal grandmother; Diabetes in her mother; Heart disease in her brother; Lung cancer in her brother; Ovarian cancer (age of onset: 14) in her maternal grandmother.    ROS:  Please see the history of present illness.  Review of Systems  Constitutional: Negative for chills and fever.  HENT: Negative for congestion and sore throat.   Respiratory: Positive for cough and shortness of breath. Negative for hemoptysis and sputum production.   Cardiovascular: Positive for chest pain, palpitations, orthopnea and PND. Negative for leg swelling.  Gastrointestinal: Positive for nausea. Negative for blood in stool and vomiting.  Genitourinary: Negative for hematuria.  Musculoskeletal: Negative for myalgias.  Neurological: Positive for dizziness. Negative for loss of consciousness.  Endo/Heme/Allergies: Environmental allergies: possible. Does not bruise/bleed easily.  Psychiatric/Behavioral: Negative for substance abuse.      Physical Exam/Data:   Vitals:   06/26/18 0928  BP: 106/81  Pulse: (!) 143  Resp: 18  Temp: 98.5 F (36.9 C)  TempSrc: Oral  SpO2: 98%  Weight: 68 kg  Height:  (1.626 m)   No intake or output data in the 24 hours ending 06/26/18 0947 Last 3 Weights 06/26/2018 03/05/2018 03/02/2018  Weight (lbs) 149 lb 14.4 oz 151 lb 153 lb 12.8 oz  Weight (kg) 67.994 kg 68.493 kg 69.763 kg   Physical Exam per MD:  Body mass index is 25.73 kg/m.  General:  Well nourished, well developed, in no acute distress. HEENT: Normal. Lymph: No adenopathy. Neck: Supple. moderate JVD, EJ vein distended with +HJR. Endocrine:  No thyromegaly. Vascular: No carotid bruits. FA pulses 2+ bilaterally   Cardiac:  Tachy and irregular. Distinct S1 and S2. No murmurs,  gallops, or rubs. Lungs: No increased work of breathing. Clear to auscultation bilaterally. No wheezes, rhonchi, or rales. Abd: Soft, non-tender, and non-distended. Ext: No edema. Musculoskeletal:  No deformities, BUE and BLE strength normal and equal. Skin: Warm and dry. Neuro:  Alert and oriented x3. No focal deficits. Psych:  Normal affect. Responds appropriately.   EKG:  EKG: Atrial fibrillation with RVR, heart rate 143 bpm, left anterior fascicular block, QT interval 340 ms, QTC 524 ms  Relevant CV Studies:  Echocardiogram 08/17/2017:  Study Conclusions: - Left ventricle: The cavity size was normal. There was mild   concentric hypertrophy. Systolic function was normal. The   estimated ejection fraction was in the range of 55% to 60%. Wall   motion was normal; there were no regional wall motion   abnormalities. Doppler parameters are consistent with abnormal   left ventricular relaxation (grade 1 diastolic dysfunction). - Aortic valve: S/P AVR with a 21 mm Magna Ease bioprosthetic   valve. There was mild stenosis. Mean gradient (S): 23 mm Hg.   Valve area (VTI): 0.84 cm^2. Valve area (Vmax): 0.82 cm^2. Valve   area (Vmean): 0.88 cm^2. - Mitral valve: There was mild regurgitation. - Right ventricle: Systolic function was normal. - Right atrium: The atrium was normal in size. - Tricuspid valve: There was no regurgitation. - Pericardium, extracardiac: There was no pericardial effusion.  Impressions: - S/P AVR with a 21 mm Magna Ease bioprosthetic valve. When   compared to the prior study from 08/10/2016 LVEF has improved from   40-45% to 55-60%, transaortic gradient are now mildly elevated   with peak/mean gradients 40/23 mmHg (previously 26/16 mmHg). _______________  Cardiac Catheterization 04/05/2016: 1. Severe aortic valve regurgitation 2. Widely patent coronary arteries with minimal irregularity of the LAD at the origin of the first diagonal 3. Normal right heart  hemodynamics  Laboratory Data:  ChemistryNo results for input(s): NA, K, CL, CO2, GLUCOSE, BUN, CREATININE, CALCIUM, GFRNONAA, GFRAA, ANIONGAP in the last 168 hours.  No results for input(s): PROT, ALBUMIN, AST, ALT, ALKPHOS, BILITOT in the last 168 hours. HematologyNo results for input(s): WBC, RBC, HGB, HCT, MCV, MCH, MCHC, RDW, PLT in the last 168 hours. Cardiac EnzymesNo results for input(s): TROPONINI in the last 168 hours. No results for input(s): TROPIPOC in the last 168 hours.  BNPNo results for input(s): BNP, PROBNP in the last 168 hours.  DDimer No results for input(s): DDIMER in the last 168 hours.  Radiology/Studies:  No results found.  Assessment and Plan:   Acute on Chronic Combined CHF - Patient admitted for progressive symptoms of shortness of breath, orthopnea, PND, dizziness, and generalized weakness over the last couple of weeks. Felt to be due to acute on chronic CHF. Patient was directly admitted from home for further evaluation and management. - Likely secondary to atrial fibrillation with RVR. - BNP pending. - Most recent Echo from 08/2017 showed LVEF of 55-60% with grade 1 diastolic dysfunction and no regional wall motion abnormalities. - Echo pending. - MD to assess volume status. - Patient on PRN Lasix 20mg  at home. She reports she normally does not need this but has taken 4 doses over the last 2 days with the last dose being this morning. - Will start IV Lasix 40mg  daily. May need to increase to twice daily. - Patient previously on Lopressor at home but has not been taking this recently. Will restart here. - Continue to monitor daily weights, strict I/O's, and renal function.  Atrial Fibrillation with RVR - Patient s/p DCCV in 02/2018 but this was unsuccessful. Patient referred to Atrial Fibrillation Clinic and was planning to start Tikosyn but this was postponed due to COVID-19 pandemic. - EKG pending. - Telemetry shows atrial fibrillation with rates in the  130's to 150's. - Patient has not tolerated Amiodarone in the past. She has been on Cardizem and Lopressor at home but has reportedly not been taking these recently. Suspect that EF is reduced so will not add Cardizem back. Discussed with Dr. Excell Seltzer -  will start Lopressor 25mg  three times daily at this time.  Will need to monitor BP closely. - Continue chronic anticoagulation with Eliquis 5mg  twice daily (of note, patient thinks she missed one dose last week but is otherwise compliant). - Will likely consult EP to help with Tikosyn initiation.  Chest Pain - Patient does report some chest pressure over the past 2-3 weeks and describes it has "someone sitting on her chest." However, also reports some indigestion and brief sharp pain this morning.  - Cardiac catheterization in 03/2016 showed widely patent coronary arteries with minimal irregularity of the LAD at the origin of the first diagonal. - EKG pending. - Given normal cardiac catheterization 2 years ago, suspect chest discomfort is secondary to acute CHF.  Aortic Insufficiency s/p Bioprosthetic AVR  - Patient s/p bioprosthetic AVR by Dr. Tyrone Sage in 04/2016. - Repeat Echo pending.  Severity of Illness: The appropriate patient status for this patient is INPATIENT. Inpatient status is judged to be reasonable and necessary in order to provide the required intensity of service to ensure the patient's safety. The patient's presenting symptoms, physical exam findings, and initial radiographic and laboratory data in the context of their chronic comorbidities is felt to place them at high risk for further clinical deterioration. Furthermore, it is not anticipated that the patient will be medically stable for discharge from the hospital within 2 midnights of admission. The following factors support the patient status of inpatient.   " The patient's presenting symptoms include shortness of breath, orthopnea, dizziness, generalized weakness. " The  worrisome physical exam findings include tachycardia. " The initial radiographic and laboratory data are pending. " The chronic co-morbidities include chronic combined CHF, persistent atrial fibrillation.   * I certify that at the point of admission it is my clinical judgment that the patient will require inpatient hospital care spanning beyond 2 midnights from the point of admission due to high intensity of service, high risk for further deterioration and high frequency of surveillance required.*    For questions or updates, please contact [facility] Please consult [email or web address] for contact info under   Signed, Corrin Parker, PA-C  06/26/2018 9:47 AM   Patient seen, examined. Available data reviewed. Agree with findings, assessment, and plan as outlined by Marjie Skiff, PA-C.  The patient is independently interviewed and examined.  The physical examination findings documented above reflect my personal findings.  The patient is well-known to me.  She has aortic valve disease status post bioprosthetic aortic valve replacement 2 years ago complicated by postoperative atrial fibrillation.  At the time, she was treated with amiodarone but tolerated this poorly secondary to nausea and fatigue.  She maintained sinus rhythm but over the course of the last year has developed persistent atrial fibrillation.  This occurred when she was vacationing at [place] and she was treated with a rate control strategy after several days in the hospital.  She then underwent elective cardioversion but has again developed persistent atrial fibrillation.  She was seen in the atrial fibrillation clinic in December 2019 and hospitalization for dofetilide initiation was discussed at that time.  The patient declined for personal reasons.  She has progressively felt worse over the past few months and she contacted me yesterday with symptoms of worsening fatigue, dyspnea, and orthopnea.  She has developed PND.  She feels  extremely dizzy at times and occasionally has been so weak that she cannot even lift her arms up.  She has tried to avoid the hospital because  of COVID-19, but with worsening symptoms she reached out yesterday for help.  The patient has atrial fibrillation with RVR.  I have reviewed her EKG which demonstrates a ventricular rate of 143 bpm.  An echocardiogram is performed and the interpretation is currently pending.  However, on my review I think her LVEF is severely depressed and I would estimate at approximately 20%.  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  Her ejection fraction has waxed and waned over the past year depending on whether she is in atrial fibrillation or sinus rhythm.  She now has signs of decompensated heart failure likely precipitated by rapid atrial fibrillation.  I think her options are limited to amiodarone or dofetilide.  I discussed her case with Dr. Johney FrameAllred.  We agree that since she tolerated amiodarone poorly in the past, dofetilide is probably the best option now.  While her QTC is prolonged, this is likely related to her rapid ventricular rate.  Her QT interval has been normal in the past when she is in sinus rhythm.  We will cautiously start her at a dose of 250 mg twice daily.  I will check a magnesium level before we initiate dofetilide.  Will request a formal EP consultation and Dr. Johney FrameAllred has agreed to see the patient in the morning.  I am going to hold off on IV diuresis because of her renal insufficiency.  I think if we can get her back in sinus rhythm, her heart failure will be much easier to treat.  For now will limit her treatment to metoprolol since her blood pressure remains low while she is in rapid atrial fibrillation.  She would not tolerate an ACE/ARB/aldosterone antagonist.  Tonny BollmanMichael Anissia Wessells, M.D. 06/26/2018 12:48 PM

## 2018-06-27 ENCOUNTER — Encounter (HOSPITAL_COMMUNITY): Payer: Self-pay | Admitting: Internal Medicine

## 2018-06-27 DIAGNOSIS — I4819 Other persistent atrial fibrillation: Principal | ICD-10-CM

## 2018-06-27 LAB — BASIC METABOLIC PANEL
Anion gap: 11 (ref 5–15)
BUN: 27 mg/dL — ABNORMAL HIGH (ref 8–23)
CO2: 26 mmol/L (ref 22–32)
Calcium: 9.2 mg/dL (ref 8.9–10.3)
Chloride: 102 mmol/L (ref 98–111)
Creatinine, Ser: 1.39 mg/dL — ABNORMAL HIGH (ref 0.44–1.00)
GFR calc Af Amer: 46 mL/min — ABNORMAL LOW (ref >60)
GFR calc non Af Amer: 39 mL/min — ABNORMAL LOW (ref >60)
Glucose, Bld: 103 mg/dL — ABNORMAL HIGH (ref 70–99)
Potassium: 3.6 mmol/L (ref 3.5–5.1)
Sodium: 139 mmol/L (ref 135–145)

## 2018-06-27 LAB — HIV ANTIBODY (ROUTINE TESTING W REFLEX): HIV Screen 4th Generation wRfx: NONREACTIVE

## 2018-06-27 LAB — MAGNESIUM: Magnesium: 2.3 mg/dL (ref 1.7–2.4)

## 2018-06-27 MED ORDER — SODIUM CHLORIDE 0.9 % IV SOLN
INTRAVENOUS | Status: DC
  Administered 2018-06-27: 20 mL/h via INTRAVENOUS

## 2018-06-27 MED ORDER — DOFETILIDE 250 MCG PO CAPS
250.0000 ug | ORAL_CAPSULE | Freq: Two times a day (BID) | ORAL | Status: DC
  Administered 2018-06-27 (×2): 250 ug via ORAL
  Filled 2018-06-27 (×3): qty 1

## 2018-06-27 MED ORDER — POTASSIUM CHLORIDE CRYS ER 20 MEQ PO TBCR
40.0000 meq | EXTENDED_RELEASE_TABLET | Freq: Once | ORAL | Status: AC
  Administered 2018-06-27: 40 meq via ORAL
  Filled 2018-06-27: qty 2

## 2018-06-27 NOTE — Consult Note (Signed)
Cardiology Consultation:   Patient ID: Wanda [name] MRN: [id number]; DOB: 10/23/1950  Admit date: 06/26/2018 Date of Consult: 06/27/2018  Primary Care Provider: [place], [name], MD Primary Cardiologist: Tonny Bollman, MD  Primary Electrophysiologist:  None    Patient Profile:   Wanda [name] is a 68 y.o. 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 Excell Seltzer.d  History of Present Illness:   [name] reports initially having atrial fibrillation 2018 following AVR. She was treated with amiodarone.  She was noted to have a cardiomyopathy and CHF symptoms.  These normalized with maintenance of sinus rhythm. Unfortunately, she began to feel bad (primarily fatigue, nausea, anhedonia) with amiodarone.  This was discontinued.  She did well until this past fall, when she developed recurrent afib while at [facility].  She was hospitalized for several days and initiated on anticoagulation and rate control. She eventually underwent  DCCV 02/16/2018 with restoration of normal sinus rhythm (after 4 attempts-120 J, 150 J, 200 J x 2).  Unfortunately had symptomatic ERAF.  She was referred to the AF clinic where she was evaluated 12/19.  In the AF clinic, there was discussion of Tikosyn.  The patient was dealing with significant personal stressors and the hospitalization was delayed. Yesterday the patient admitted with worsening SOB, developed symptoms of orthopnea.  She was admitted for acute/chronic CHF, and rapid AFib.  Echo was performed which revealed EF 25%.  LABS (today) K+ 3.6 (replacement ordered) Mag 2.3 Creat 1.39 is improved today (Calc crCl is 42)  I/O 480/1900   Past Medical History:  Diagnosis Date  . Anxiety   . Arthritis 2018   hands have general aches  . CHF (congestive heart failure)  (HCC) 2018   has shortness of breath since CHF diagnosis and has increased recently; states this is reason for admission  . Chronic systolic heart failure (HCC) 05/25/2016   tachycardia mediated,  resolved previously with sinus rhythm  . Depression   . Dyspnea 2018  . Migraine   . Nonischemic cardiomyopathy (HCC)    tachycardia mediated,  resolved previously with sinus rhythm  . Persistent atrial fibrillation 05/13/2016  . PVC's (premature ventricular contractions) 05/25/2016   Holter 11/17 The basic rhythm is sinus. There are frequent PVCs, periods of ventricular bigeminy, and occasional ventricular couplets and triplets.  . S/P AVR (aortic valve replacement)     Past Surgical History:  Procedure Laterality Date  . AORTIC VALVE REPLACEMENT N/A 05/03/2016   Procedure: AORTIC VALVE REPLACEMENT (AVR) WITH MAGNA EASE PERICARDIAL BIOPROSTHESIS - AORTIC 21 MM MODEL 3300TFX;  Surgeon: [name], MD;  Location: [facility] OR;  Service: Open Heart Surgery;  Laterality: N/A;  . CARDIOVERSION N/A 02/16/2018   Procedure: CARDIOVERSION;  Surgeon: Lars Masson, MD;  Location: [facility] ENDOSCOPY;  Service: Cardiovascular;  Laterality: N/A;  . EYE SURGERY  2005   Lasik  . TEE WITHOUT CARDIOVERSION N/A 05/03/2016   Procedure: TRANSESOPHAGEAL ECHOCARDIOGRAM (TEE);  Surgeon: [name], MD;  Location: [facility] OR;  Service: Open Heart Surgery;  Laterality: N/A;  . TONSILLECTOMY AND ADENOIDECTOMY       Home Medications:  Prior to Admission medications   Medication Sig Start Date End Date Taking? Authorizing Provider  acetaminophen (TYLENOL) 500 MG tablet Take 500-1,000 mg by mouth every 6 (six) hours as  needed (for pain.).   Yes [provider]  apixaban (ELIQUIS) 5 MG TABS tablet Take 1 tablet (5 mg total) by mouth 2 (two) times daily. 02/05/18 01/31/19 Yes Tonny Bollman, MD  diltiazem (CARDIZEM CD) 240 MG 24 hr capsule Take 1 capsule (240 mg total) by mouth daily. 02/05/18 01/31/19 Yes  Tonny Bollman, MD  furosemide (LASIX) 20 MG tablet Take 1 tablet (20 mg total) by mouth daily as needed for edema. 03/02/18 06/26/18 Yes Weaver, [name] T, PA-C  LORazepam (ATIVAN) 0.5 MG tablet Take 0.5 mg by mouth daily as needed for anxiety.  04/29/14  Yes [provider]  MELATONIN PO Take 1 tablet by mouth at bedtime as needed (sleep).   Yes [provider]  metoprolol tartrate (LOPRESSOR) 25 MG tablet Take 12.5 mg by mouth 2 (two) times daily.    Yes [provider]    Inpatient Medications: Scheduled Meds: . apixaban  5 mg Oral BID  . dofetilide  250 mcg Oral BID  . furosemide  40 mg Intravenous Daily  . metoprolol tartrate  25 mg Oral TID  . sodium chloride flush  3 mL Intravenous Q12H   Continuous Infusions: . sodium chloride    . sodium chloride     PRN Meds: sodium chloride, acetaminophen, LORazepam, metoprolol tartrate, nitroGLYCERIN, ondansetron (ZOFRAN) IV, sodium chloride flush  Allergies:    Allergies  Allergen Reactions  . Codeine Nausea And Vomiting  . Vicodin [Hydrocodone-Acetaminophen] Nausea And Vomiting    Social History:   Social History   Socioeconomic History  . Marital status: Married    Spouse name: Not on file  . Number of children: Not on file  . Years of education: Not on file  . Highest education level: Not on file  Occupational History  . Not on file  Social Needs  . Financial resource strain: Not on file  . Food insecurity:    Worry: Not on file    Inability: Not on file  . Transportation needs:    Medical: Not on file    Non-medical: Not on file  Tobacco Use  . Smoking status: Former Games developer  . Smokeless tobacco: Never Used  . Tobacco comment: stopped in 70's while in college; smoked 1-2 years  Substance and Sexual Activity  . Alcohol use: Yes    Comment: 1-2 glasses of wine; hasn't had wine for last month  . Drug use: No  . Sexual activity: Yes    Partners: Male    Birth control/protection: Other-see  comments, Post-menopausal    Comment: vasectomy  Lifestyle  . Physical activity:    Days per week: Not on file    Minutes per session: Not on file  . Stress: Not on file  Relationships  . Social connections:    Talks on phone: Not on file    Gets together: Not on file    Attends religious service: Not on file    Active member of club or organization: Not on file    Attends meetings of clubs or organizations: Not on file    Relationship status: Not on file  . Intimate partner violence:    Fear of current or ex partner: Not on file    Emotionally abused: Not on file    Physically abused: Not on file    Forced sexual activity: Not on file  Other Topics Concern  . Not on file  Social History Narrative   Lives in [place]   retired    [employer]  History:   Family History  Problem Relation Age of Onset  . Diabetes Mother   . Ovarian cancer Maternal Grandmother 80  . Breast cancer Sister 33  . Congestive Heart Failure Maternal Grandfather   . Heart disease Brother        valve replacement  . Deep vein thrombosis Brother   . Lung cancer Brother   . Deep vein thrombosis Father   . Deep vein thrombosis Paternal Aunt        x2  . Deep vein thrombosis Paternal Grandmother    family members with AFib Multiple paternal relatives, including brother with hypercoagulable state and DVTs.  ROS:  Please see the history of present illness. All other ROS reviewed and negative.     Physical Exam/Data:   Vitals:   06/27/18 1324 06/27/18 1735 06/27/18 1954 06/27/18 2005  BP: (!) 86/65 96/71 (!) 89/68   Pulse: (!) 104 (!) 115 (!) 130 (!) 124  Resp: 18 18 20    Temp: 98.4 F (36.9 C) 98.1 F (36.7 C) 98.5 F (36.9 C)   TempSrc: Oral Oral Oral   SpO2: 98% 96% 97%   Weight:      Height:        Intake/Output Summary (Last 24 hours) at 06/27/2018 2029 Last data filed at 06/27/2018 1736 Gross per 24 hour  Intake 773 ml  Output 1600 ml  Net -827 ml   Last 3 Weights 06/27/2018  06/26/2018 06/26/2018  Weight (lbs) 148 lb 12.8 oz 149 lb 8 oz 149 lb 14.4 oz  Weight (kg) 67.495 kg 67.813 kg 67.994 kg     Body mass index is 25.54 kg/m.    limited exam due to COVID 19 social distancing requirements General:  Well nourished, well developed, in no acute distress HEENT: normal Lymph: no adenopathy Neck: mild  JVD Cardiac:  Tachycardic irregular rhythm Lungs:  Normal work of breathing Abd: soft, nondistended Ext:  no edema Musculoskeletal:  No obvious deformities Skin:  warm and dry  Neuro: no gross focal abnormalities noted Psych:  Normal affect   EKG:  The EKG was personally reviewed and demonstrates:   AFib 116bpm, PVCs, QTc difficult in AFib, looks OK to continue Tiksoyn  Telemetry:  Telemetry was personally reviewed and demonstrates:   AFib 90's-120's  Relevant CV Studies:  Echocardiogram 01/23/2018 [facility], [place], [place]) Mild concentric LVH, EF 45-50, normally functioning bioprosthetic aortic valve, mean gradient 12, peak gradient 17, mild MR, moderate LAE  Echo 08/17/2017 Mild concentric LVH, EF 55-60, normal wall motion, grade 1 diastolic dysfunction, normally functioning AVR with mild aortic stenosis (mean 23), mild MR   Echo 08/10/2016 Diffuse hypokinesis, moderate LVH, EF 40-45, normal diastolic function, mild MR Intraop TEE 05/03/16 EF 35-40, severe AI, mild MR Carotid US 2/18 No ICA stenosis R/L [facility] 1/18 LAD irregs prox 20 Normal R heart hemodynamics Holter 11/17 The basic rhythm is sinus. There are frequent PVCs, periods of ventricular bigeminy, and occasional ventricular couplets and triplets. Echo 11/17 EF 40-45, mild diff HK, Gr 2 DD, possibly bicuspid AV with very mild AS and severe AI, mild MR, mild LAE  Laboratory Data:  Chemistry Recent Labs  Lab 06/26/18 1010 06/27/18 0551  NA 141 139  K 4.1 3.6  CL 104 102  CO2 28 26  GLUCOSE 110* 103*  BUN 23 27*  CREATININE 1.51* 1.39*  CALCIUM 9.4 9.2   GFRNONAA 36* 39*  GFRAA 41* 46*  ANIONGAP 9 11    No results  for input(s): PROT, ALBUMIN, AST, ALT, ALKPHOS, BILITOT in the last 168 hours. Hematology Recent Labs  Lab 06/26/18 1010  WBC 6.9  RBC 4.36  HGB 13.1  HCT 40.8  MCV 93.6  MCH 30.0  MCHC 32.1  RDW 13.0  PLT 224   Cardiac EnzymesNo results for input(s): TROPONINI in the last 168 hours. No results for input(s): TROPIPOC in the last 168 hours.  BNP Recent Labs  Lab 06/26/18 1010  BNP 762.2*    DDimer No results for input(s): DDIMER in the last 168 hours.  Radiology/Studies:   Dg Chest Port 1 View Result Date: 06/26/2018 CLINICAL DATA:  Shortness of breath. EXAM: PORTABLE CHEST 1 VIEW COMPARISON:  Radiographs of June 16, 2016. FINDINGS: Stable cardiomediastinal silhouette. Status post aortic valve repair. No pneumothorax or pleural effusion is noted. Both lungs are clear. The visualized skeletal structures are unremarkable. IMPRESSION: No active disease. Electronically Signed   By: Lupita Raider M.D.   On: 06/26/2018 10:21    Assessment and Plan:   1. Persistent AFib The patient has symptomatic, recurrent persistent atrial fibrillation. she has failed medical therapy with amiodarone. Chads2vasc score is 3.  she is anticoagulated with eliquis . Therapeutic strategies for afib including medicine (tikosyn) and ablation were discussed in detail with the patient today  She worries about risks of long term medical therapy.  She has not well tolerated metoprolol or amiodarone in the past. Risk, benefits, and alternatives to EP study and radiofrequency ablation for afib were also discussed in detail today. These risks include but are not limited to stroke, bleeding, vascular damage, tamponade, perforation, damage to the esophagus, lungs, and other structures, pulmonary vein stenosis, worsening renal function, and death. The patient understands these risk and wishes to proceed.  We will therefore proceed with urgent,  nonelective catheter ablation at this time.  Will make NPO after midnight for ablation.  Dr Delton See to perform TEE on the table prior to ablation.  Given Bryant AF trial results, I think that she would do very well with ablation. We will continue tikosyn load.  We may decide to keep on tikosyn for the short term post ablation.      2. Acute/chronic systolic dysfunction/ nonischemic CM Tachycardia mediated She has decompensated due to AF with RVR.  Intervention of AF should improve her EF. Continue gentle diuresis  3. VHD, has bioprosthetic AVR     Functioning well on echo    Hillis Range MD, [facility] 06/27/2018 8:35 PM

## 2018-06-27 NOTE — Anesthesia Preprocedure Evaluation (Addendum)
Anesthesia Evaluation  Patient identified by MRN, date of birth, ID band Patient awake    Reviewed: Allergy & Precautions, NPO status , Patient's Chart, lab work & pertinent test results, reviewed documented beta blocker date and time   History of Anesthesia Complications Negative for: history of anesthetic complications  Airway Mallampati: II  TM Distance: >3 FB Neck ROM: Full    Dental  (+) Dental Advisory Given   Pulmonary shortness of breath, former smoker,    breath sounds clear to auscultation       Cardiovascular hypertension, Pt. on medications and Pt. on home beta blockers (-) angina+ Orthopnea and + PND  + dysrhythmias Atrial Fibrillation + Valvular Problems/Murmurs (s/p AVR)  Rhythm:Irregular Rate:Normal  06/26/2018 ECHO: EF 20-25%, diffuse hypokinesis, mild-mod MR   Neuro/Psych Anxiety Depression negative neurological ROS     GI/Hepatic negative GI ROS, Neg liver ROS,   Endo/Other  negative endocrine ROS  Renal/GU Renal InsufficiencyRenal disease (creat 1.39)     Musculoskeletal   Abdominal   Peds  Hematology eliquis   Anesthesia Other Findings   Reproductive/Obstetrics                            Anesthesia Physical Anesthesia Plan  ASA: IV  Anesthesia Plan: General   Post-op Pain Management:    Induction: Intravenous  PONV Risk Score and Plan: 3 and Ondansetron, Dexamethasone and Treatment may vary due to age or medical condition  Airway Management Planned: Oral ETT  Additional Equipment: Arterial line  Intra-op Plan:   Post-operative Plan: Extubation in OR  Informed Consent: I have reviewed the patients History and Physical, chart, labs and discussed the procedure including the risks, benefits and alternatives for the proposed anesthesia with the patient or authorized representative who has indicated his/her understanding and acceptance.     Dental advisory  given  Plan Discussed with: CRNA and Surgeon  Anesthesia Plan Comments: (Plan routine monitors, A line, GETA )       Anesthesia Quick Evaluation

## 2018-06-27 NOTE — Progress Notes (Addendum)
Post dose EKG is reviewed.  Accurate QT is difficult in AF, looks 360-435ms, corrects to 478-541ms,  D/w Dr. Johney Frame. will continue Tikosyn dose this evening.  Planned for EPS/ablation tomorrow  Francis Dowse, PA-C

## 2018-06-27 NOTE — Progress Notes (Signed)
MD called stated to give Tykosin as ordered informed of bp charted in epic.

## 2018-06-27 NOTE — H&P (View-Only) (Signed)
Cardiology Consultation:   Patient ID: Wanda [name] MRN: [id number]; DOB: 01/12/1951  Admit date: 06/26/2018 Date of Consult: 06/27/2018  Primary Care Provider: Jordan, Wanda G, MD Primary Cardiologist: Michael Cooper, MD  Primary Electrophysiologist:  None    Patient Profile:   Wanda [name] is a 68 y.o. 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 Cooper.d  History of Present Illness:   [name] reports initially having atrial fibrillation 2018 following AVR. She was treated with amiodarone.  She was noted to have a cardiomyopathy and CHF symptoms.  These normalized with maintenance of sinus rhythm. Unfortunately, she began to feel bad (primarily fatigue, nausea, anhedonia) with amiodarone.  This was discontinued.  She did well until this past fall, when she developed recurrent afib while at [place].  She was hospitalized for several days and initiated on anticoagulation and rate control. She eventually underwent  DCCV 02/16/2018 with restoration of normal sinus rhythm (after 4 attempts-120 J, 150 J, 200 J x 2).  Unfortunately had symptomatic ERAF.  She was referred to the AF clinic where she was evaluated 12/19.  In the AF clinic, there was discussion of Tikosyn.  The patient was dealing with significant personal stressors and the hospitalization was delayed. Yesterday the patient admitted with worsening SOB, developed symptoms of orthopnea.  She was admitted for acute/chronic CHF, and rapid AFib.  Echo was performed which revealed EF 25%.  LABS (today) K+ 3.6 (replacement ordered) Mag 2.3 Creat 1.39 is improved today (Calc crCl is 42)  I/O 480/1900   Past Medical History:  Diagnosis Date  . Anxiety   . Arthritis 2018   hands have general aches  . CHF (congestive heart failure)  (HCC) 2018   has shortness of breath since CHF diagnosis and has increased recently; states this is reason for admission  . Chronic systolic heart failure (HCC) 05/25/2016   tachycardia mediated,  resolved previously with sinus rhythm  . Depression   . Dyspnea 2018  . Migraine   . Nonischemic cardiomyopathy (HCC)    tachycardia mediated,  resolved previously with sinus rhythm  . Persistent atrial fibrillation 05/13/2016  . PVC's (premature ventricular contractions) 05/25/2016   Holter 11/17 The basic rhythm is sinus. There are frequent PVCs, periods of ventricular bigeminy, and occasional ventricular couplets and triplets.  . S/P AVR (aortic valve replacement)     Past Surgical History:  Procedure Laterality Date  . AORTIC VALVE REPLACEMENT N/A 05/03/2016   Procedure: AORTIC VALVE REPLACEMENT (AVR) WITH MAGNA EASE PERICARDIAL BIOPROSTHESIS - AORTIC 21 MM MODEL 3300TFX;  Surgeon: Edward B Gerhardt, MD;  Location: [facility] OR;  Service: Open Heart Surgery;  Laterality: N/A;  . CARDIOVERSION N/A 02/16/2018   Procedure: CARDIOVERSION;  Surgeon: Nelson, Katarina H, MD;  Location: [facility] ENDOSCOPY;  Service: Cardiovascular;  Laterality: N/A;  . EYE SURGERY  2005   Lasik  . TEE WITHOUT CARDIOVERSION N/A 05/03/2016   Procedure: TRANSESOPHAGEAL ECHOCARDIOGRAM (TEE);  Surgeon: Edward B Gerhardt, MD;  Location: [facility] OR;  Service: Open Heart Surgery;  Laterality: N/A;  . TONSILLECTOMY AND ADENOIDECTOMY       Home Medications:  Prior to Admission medications   Medication Sig Start Date End Date Taking? Authorizing Provider  acetaminophen (TYLENOL) 500 MG tablet Take 500-1,000 mg by mouth every 6 (six) hours as   needed (for pain.).   Yes [provider]  apixaban (ELIQUIS) 5 MG TABS tablet Take 1 tablet (5 mg total) by mouth 2 (two) times daily. 02/05/18 01/31/19 Yes Cooper, Michael, MD  diltiazem (CARDIZEM CD) 240 MG 24 hr capsule Take 1 capsule (240 mg total) by mouth daily. 02/05/18 01/31/19 Yes  Cooper, Michael, MD  furosemide (LASIX) 20 MG tablet Take 1 tablet (20 mg total) by mouth daily as needed for edema. 03/02/18 06/26/18 Yes Weaver, [name] T, PA-C  LORazepam (ATIVAN) 0.5 MG tablet Take 0.5 mg by mouth daily as needed for anxiety.  04/29/14  Yes [provider]  MELATONIN PO Take 1 tablet by mouth at bedtime as needed (sleep).   Yes [provider]  metoprolol tartrate (LOPRESSOR) 25 MG tablet Take 12.5 mg by mouth 2 (two) times daily.    Yes [provider]    Inpatient Medications: Scheduled Meds: . apixaban  5 mg Oral BID  . dofetilide  250 mcg Oral BID  . furosemide  40 mg Intravenous Daily  . metoprolol tartrate  25 mg Oral TID  . sodium chloride flush  3 mL Intravenous Q12H   Continuous Infusions: . sodium chloride    . sodium chloride     PRN Meds: sodium chloride, acetaminophen, LORazepam, metoprolol tartrate, nitroGLYCERIN, ondansetron (ZOFRAN) IV, sodium chloride flush  Allergies:    Allergies  Allergen Reactions  . Codeine Nausea And Vomiting  . Vicodin [Hydrocodone-Acetaminophen] Nausea And Vomiting    Social History:   Social History   Socioeconomic History  . Marital status: Married    Spouse name: Not on file  . Number of children: Not on file  . Years of education: Not on file  . Highest education level: Not on file  Occupational History  . Not on file  Social Needs  . Financial resource strain: Not on file  . Food insecurity:    Worry: Not on file    Inability: Not on file  . Transportation needs:    Medical: Not on file    Non-medical: Not on file  Tobacco Use  . Smoking status: Former Smoker  . Smokeless tobacco: Never Used  . Tobacco comment: stopped in 70's while in college; smoked 1-2 years  Substance and Sexual Activity  . Alcohol use: Yes    Comment: 1-2 glasses of wine; hasn't had wine for last month  . Drug use: No  . Sexual activity: Yes    Partners: Male    Birth control/protection: Other-see  comments, Post-menopausal    Comment: vasectomy  Lifestyle  . Physical activity:    Days per week: Not on file    Minutes per session: Not on file  . Stress: Not on file  Relationships  . Social connections:    Talks on phone: Not on file    Gets together: Not on file    Attends religious service: Not on file    Active member of club or organization: Not on file    Attends meetings of clubs or organizations: Not on file    Relationship status: Not on file  . Intimate partner violence:    Fear of current or ex partner: Not on file    Emotionally abused: Not on file    Physically abused: Not on file    Forced sexual activity: Not on file  Other Topics Concern  . Not on file  Social History Narrative   Lives in [place]   retired    Family   History:   Family History  Problem Relation Age of Onset  . Diabetes Mother   . Ovarian cancer Maternal Grandmother 80  . Breast cancer Sister 48  . Congestive Heart Failure Maternal Grandfather   . Heart disease Brother        valve replacement  . Deep vein thrombosis Brother   . Lung cancer Brother   . Deep vein thrombosis Father   . Deep vein thrombosis Paternal Aunt        x2  . Deep vein thrombosis Paternal Grandmother    family members with AFib Multiple paternal relatives, including brother with hypercoagulable state and DVTs.  ROS:  Please see the history of present illness. All other ROS reviewed and negative.     Physical Exam/Data:   Vitals:   06/27/18 1324 06/27/18 1735 06/27/18 1954 06/27/18 2005  BP: (!) 86/65 96/71 (!) 89/68   Pulse: (!) 104 (!) 115 (!) 130 (!) 124  Resp: 18 18 20   Temp: 98.4 F (36.9 C) 98.1 F (36.7 C) 98.5 F (36.9 C)   TempSrc: Oral Oral Oral   SpO2: 98% 96% 97%   Weight:      Height:        Intake/Output Summary (Last 24 hours) at 06/27/2018 2029 Last data filed at 06/27/2018 1736 Gross per 24 hour  Intake 773 ml  Output 1600 ml  Net -827 ml   Last 3 Weights 06/27/2018  06/26/2018 06/26/2018  Weight (lbs) 148 lb 12.8 oz 149 lb 8 oz 149 lb 14.4 oz  Weight (kg) 67.495 kg 67.813 kg 67.994 kg     Body mass index is 25.54 kg/m.    limited exam due to COVID 19 social distancing requirements General:  Well nourished, well developed, in no acute distress HEENT: normal Lymph: no adenopathy Neck: mild  JVD Cardiac:  Tachycardic irregular rhythm Lungs:  Normal work of breathing Abd: soft, nondistended Ext:  no edema Musculoskeletal:  No obvious deformities Skin:  warm and dry  Neuro: no gross focal abnormalities noted Psych:  Normal affect   EKG:  The EKG was personally reviewed and demonstrates:   AFib 116bpm, PVCs, QTc difficult in AFib, looks OK to continue Tiksoyn  Telemetry:  Telemetry was personally reviewed and demonstrates:   AFib 90's-120's  Relevant CV Studies:  Echocardiogram 01/23/2018 ([facility], [place], [place]) Mild concentric LVH, EF 45-50, normally functioning bioprosthetic aortic valve, mean gradient 12, peak gradient 17, mild MR, moderate LAE  Echo 08/17/2017 Mild concentric LVH, EF 55-60, normal wall motion, grade 1 diastolic dysfunction, normally functioning AVR with mild aortic stenosis (mean 23), mild MR   Echo 08/10/2016 Diffuse hypokinesis, moderate LVH, EF 40-45, normal diastolic function, mild MR Intraop TEE 05/03/16 EF 35-40, severe AI, mild MR Carotid US 2/18 No ICA stenosis R/L HC 1/18 LAD irregs prox 20 Normal R heart hemodynamics Holter 11/17 The basic rhythm is sinus. There are frequent PVCs, periods of ventricular bigeminy, and occasional ventricular couplets and triplets. Echo 11/17 EF 40-45, mild diff HK, Gr 2 DD, possibly bicuspid AV with very mild AS and severe AI, mild MR, mild LAE  Laboratory Data:  Chemistry Recent Labs  Lab 06/26/18 1010 06/27/18 0551  NA 141 139  K 4.1 3.6  CL 104 102  CO2 28 26  GLUCOSE 110* 103*  BUN 23 27*  CREATININE 1.51* 1.39*  CALCIUM 9.4 9.2   GFRNONAA 36* 39*  GFRAA 41* 46*  ANIONGAP 9 11    No results   for input(s): PROT, ALBUMIN, AST, ALT, ALKPHOS, BILITOT in the last 168 hours. Hematology Recent Labs  Lab 06/26/18 1010  WBC 6.9  RBC 4.36  HGB 13.1  HCT 40.8  MCV 93.6  MCH 30.0  MCHC 32.1  RDW 13.0  PLT 224   Cardiac EnzymesNo results for input(s): TROPONINI in the last 168 hours. No results for input(s): TROPIPOC in the last 168 hours.  BNP Recent Labs  Lab 06/26/18 1010  BNP 762.2*    DDimer No results for input(s): DDIMER in the last 168 hours.  Radiology/Studies:   Dg Chest Port 1 View Result Date: 06/26/2018 CLINICAL DATA:  Shortness of breath. EXAM: PORTABLE CHEST 1 VIEW COMPARISON:  Radiographs of June 16, 2016. FINDINGS: Stable cardiomediastinal silhouette. Status post aortic valve repair. No pneumothorax or pleural effusion is noted. Both lungs are clear. The visualized skeletal structures are unremarkable. IMPRESSION: No active disease. Electronically Signed   By: Laney Louderback  Green Jr M.D.   On: 06/26/2018 10:21    Assessment and Plan:   1. Persistent AFib The patient has symptomatic, recurrent persistent atrial fibrillation. she has failed medical therapy with amiodarone. Chads2vasc score is 3.  she is anticoagulated with eliquis . Therapeutic strategies for afib including medicine (tikosyn) and ablation were discussed in detail with the patient today  She worries about risks of long term medical therapy.  She has not well tolerated metoprolol or amiodarone in the past. Risk, benefits, and alternatives to EP study and radiofrequency ablation for afib were also discussed in detail today. These risks include but are not limited to stroke, bleeding, vascular damage, tamponade, perforation, damage to the esophagus, lungs, and other structures, pulmonary vein stenosis, worsening renal function, and death. The patient understands these risk and wishes to proceed.  We will therefore proceed with urgent,  nonelective catheter ablation at this time.  Will make NPO after midnight for ablation.  Dr Nelson to perform TEE on the table prior to ablation.  Given [place] AF trial results, I think that she would do very well with ablation. We will continue tikosyn load.  We may decide to keep on tikosyn for the short term post ablation.      2. Acute/chronic systolic dysfunction/ nonischemic CM Tachycardia mediated She has decompensated due to AF with RVR.  Intervention of AF should improve her EF. Continue gentle diuresis  3. VHD, has bioprosthetic AVR     Functioning well on echo    Hildur Bayer MD, FACC [facility] 06/27/2018 8:35 PM   

## 2018-06-28 ENCOUNTER — Encounter (HOSPITAL_COMMUNITY)
Admission: AD | Disposition: A | Discharge status: Home / Self Care | Payer: Self-pay | Source: Home / Self Care | Attending: Cardiovascular Disease

## 2018-06-28 ENCOUNTER — Other Ambulatory Visit: Payer: Self-pay

## 2018-06-28 ENCOUNTER — Inpatient Hospital Stay (HOSPITAL_COMMUNITY): Payer: Medicare Other | Admitting: Certified Registered Nurse Anesthetist

## 2018-06-28 ENCOUNTER — Inpatient Hospital Stay (HOSPITAL_COMMUNITY): Payer: Medicare Other

## 2018-06-28 DIAGNOSIS — I4891 Unspecified atrial fibrillation: Secondary | ICD-10-CM

## 2018-06-28 DIAGNOSIS — I4819 Other persistent atrial fibrillation: Secondary | ICD-10-CM | POA: Diagnosis not present

## 2018-06-28 DIAGNOSIS — I340 Nonrheumatic mitral (valve) insufficiency: Secondary | ICD-10-CM

## 2018-06-28 HISTORY — PX: ATRIAL FIBRILLATION ABLATION: EP1191

## 2018-06-28 LAB — BASIC METABOLIC PANEL
Anion gap: 11 (ref 5–15)
BUN: 30 mg/dL — ABNORMAL HIGH (ref 8–23)
CO2: 25 mmol/L (ref 22–32)
Calcium: 9 mg/dL (ref 8.9–10.3)
Chloride: 104 mmol/L (ref 98–111)
Creatinine, Ser: 1.58 mg/dL — ABNORMAL HIGH (ref 0.44–1.00)
GFR calc Af Amer: 39 mL/min — ABNORMAL LOW (ref >60)
GFR calc non Af Amer: 34 mL/min — ABNORMAL LOW (ref >60)
Glucose, Bld: 93 mg/dL (ref 70–99)
Potassium: 4 mmol/L (ref 3.5–5.1)
Sodium: 140 mmol/L (ref 135–145)

## 2018-06-28 LAB — POCT ACTIVATED CLOTTING TIME
Activated Clotting Time: 285 seconds
Activated Clotting Time: 323 seconds
Activated Clotting Time: 318 seconds
Activated Clotting Time: 351 seconds
Activated Clotting Time: 180 seconds
Activated Clotting Time: 164 seconds

## 2018-06-28 LAB — MAGNESIUM: Magnesium: 2.3 mg/dL (ref 1.7–2.4)

## 2018-06-28 SURGERY — ATRIAL FIBRILLATION ABLATION
Anesthesia: General

## 2018-06-28 MED ORDER — FENTANYL CITRATE (PF) 100 MCG/2ML IJ SOLN
25.0000 ug | Freq: Once | INTRAMUSCULAR | Status: AC
  Administered 2018-06-28: 25 ug via INTRAVENOUS

## 2018-06-28 MED ORDER — DOPAMINE-DEXTROSE 3.2-5 MG/ML-% IV SOLN
0.0000 ug/kg/min | INTRAVENOUS | Status: DC
  Filled 2018-06-28: qty 250

## 2018-06-28 MED ORDER — ACETAMINOPHEN 500 MG PO TABS
ORAL_TABLET | ORAL | Status: AC
  Filled 2018-06-28: qty 1

## 2018-06-28 MED ORDER — DOPAMINE-DEXTROSE 3.2-5 MG/ML-% IV SOLN
INTRAVENOUS | Status: DC | PRN
  Administered 2018-06-28: 3 ug/kg/min via INTRAVENOUS

## 2018-06-28 MED ORDER — SODIUM CHLORIDE 0.9 % IV SOLN: INTRAVENOUS | Status: DC | PRN

## 2018-06-28 MED ORDER — SODIUM CHLORIDE 0.9 % IV SOLN: 250.0000 mL | INTRAVENOUS | Status: DC | PRN

## 2018-06-28 MED ORDER — MAGNESIUM SULFATE 50 % IJ SOLN
INTRAMUSCULAR | Status: DC | PRN
  Administered 2018-06-28: 1 g via INTRAVENOUS

## 2018-06-28 MED ORDER — HEPARIN SODIUM (PORCINE) 1000 UNIT/ML IJ SOLN
INTRAMUSCULAR | Status: DC | PRN
  Administered 2018-06-28: 2000 [IU] via INTRAVENOUS
  Administered 2018-06-28: 4000 [IU] via INTRAVENOUS
  Administered 2018-06-28: 12000 [IU] via INTRAVENOUS
  Administered 2018-06-28: 4000 [IU] via INTRAVENOUS

## 2018-06-28 MED ORDER — BUPIVACAINE HCL (PF) 0.25 % IJ SOLN
INTRAMUSCULAR | Status: AC
  Filled 2018-06-28: qty 30

## 2018-06-28 MED ORDER — SODIUM CHLORIDE 0.9 % IV SOLN: Freq: Once | INTRAVENOUS | Status: AC

## 2018-06-28 MED ORDER — ETOMIDATE 2 MG/ML IV SOLN
INTRAVENOUS | Status: DC | PRN
  Administered 2018-06-28: 20 mg via INTRAVENOUS

## 2018-06-28 MED ORDER — BUPIVACAINE HCL (PF) 0.25 % IJ SOLN
INTRAMUSCULAR | Status: DC | PRN
  Administered 2018-06-28: 20 mL

## 2018-06-28 MED ORDER — DEXAMETHASONE SODIUM PHOSPHATE 4 MG/ML IJ SOLN
INTRAMUSCULAR | Status: DC | PRN
  Administered 2018-06-28: 5 mg via INTRAVENOUS

## 2018-06-28 MED ORDER — HEPARIN (PORCINE) IN NACL 1000-0.9 UT/500ML-% IV SOLN
INTRAVENOUS | Status: AC
  Filled 2018-06-28: qty 500

## 2018-06-28 MED ORDER — PHENYLEPHRINE HCL (PRESSORS) 10 MG/ML IV SOLN
INTRAVENOUS | Status: DC | PRN
  Administered 2018-06-28 (×3): 80 ug via INTRAVENOUS

## 2018-06-28 MED ORDER — ONDANSETRON HCL 4 MG/2ML IJ SOLN: 4.0000 mg | Freq: Four times a day (QID) | INTRAMUSCULAR | Status: DC | PRN

## 2018-06-28 MED ORDER — ROCURONIUM BROMIDE 100 MG/10ML IV SOLN
INTRAVENOUS | Status: DC | PRN
  Administered 2018-06-28 (×2): 10 mg via INTRAVENOUS
  Administered 2018-06-28: 40 mg via INTRAVENOUS

## 2018-06-28 MED ORDER — MIDAZOLAM HCL 5 MG/5ML IJ SOLN
INTRAMUSCULAR | Status: DC | PRN
  Administered 2018-06-28: 1 mg via INTRAVENOUS

## 2018-06-28 MED ORDER — SODIUM CHLORIDE 0.9 % IV SOLN
INTRAVENOUS | Status: DC | PRN
  Administered 2018-06-28: 15 ug/min via INTRAVENOUS

## 2018-06-28 MED ORDER — HEPARIN SODIUM (PORCINE) 1000 UNIT/ML IJ SOLN
INTRAMUSCULAR | Status: DC | PRN
  Administered 2018-06-28: 1000 [IU] via INTRAVENOUS

## 2018-06-28 MED ORDER — PROTAMINE SULFATE 10 MG/ML IV SOLN
INTRAVENOUS | Status: DC | PRN
  Administered 2018-06-28: 10 mg via INTRAVENOUS
  Administered 2018-06-28: 20 mg via INTRAVENOUS
  Administered 2018-06-28: 10 mg via INTRAVENOUS

## 2018-06-28 MED ORDER — FENTANYL CITRATE (PF) 100 MCG/2ML IJ SOLN
INTRAMUSCULAR | Status: AC
  Filled 2018-06-28: qty 2

## 2018-06-28 MED ORDER — SUGAMMADEX SODIUM 200 MG/2ML IV SOLN
INTRAVENOUS | Status: DC | PRN
  Administered 2018-06-28: 200 mg via INTRAVENOUS

## 2018-06-28 MED ORDER — SUCCINYLCHOLINE CHLORIDE 20 MG/ML IJ SOLN
INTRAMUSCULAR | Status: DC | PRN
  Administered 2018-06-28: 140 mg via INTRAVENOUS

## 2018-06-28 MED ORDER — FENTANYL CITRATE (PF) 100 MCG/2ML IJ SOLN
25.0000 ug | INTRAMUSCULAR | Status: DC | PRN
  Administered 2018-06-28 – 2018-06-29 (×5): 25 ug via INTRAVENOUS
  Filled 2018-06-28 (×5): qty 2

## 2018-06-28 MED ORDER — ONDANSETRON HCL 4 MG/2ML IJ SOLN
INTRAMUSCULAR | Status: DC | PRN
  Administered 2018-06-28: 4 mg via INTRAVENOUS

## 2018-06-28 MED ORDER — HEPARIN (PORCINE) IN NACL 1000-0.9 UT/500ML-% IV SOLN
INTRAVENOUS | Status: DC | PRN
  Administered 2018-06-28: 500 mL

## 2018-06-28 MED ORDER — HEPARIN SODIUM (PORCINE) 1000 UNIT/ML IJ SOLN
INTRAMUSCULAR | Status: AC
  Filled 2018-06-28: qty 1

## 2018-06-28 MED ORDER — FENTANYL CITRATE (PF) 250 MCG/5ML IJ SOLN
INTRAMUSCULAR | Status: DC | PRN
  Administered 2018-06-28: 100 ug via INTRAVENOUS

## 2018-06-28 MED ORDER — SODIUM CHLORIDE 0.9% FLUSH: 3.0000 mL | INTRAVENOUS | Status: DC | PRN

## 2018-06-28 MED ORDER — CYCLOBENZAPRINE HCL 10 MG PO TABS
10.0000 mg | ORAL_TABLET | Freq: Once | ORAL | Status: AC
  Administered 2018-06-28: 10 mg via ORAL
  Filled 2018-06-28: qty 1

## 2018-06-28 MED ORDER — SODIUM CHLORIDE 0.9% FLUSH
3.0000 mL | Freq: Two times a day (BID) | INTRAVENOUS | Status: DC
  Administered 2018-06-28 – 2018-06-30 (×3): 3 mL via INTRAVENOUS

## 2018-06-28 MED ORDER — LACTATED RINGERS IV SOLN
INTRAVENOUS | Status: DC | PRN
  Administered 2018-06-28: 08:00:00 via INTRAVENOUS

## 2018-06-28 MED ORDER — ACETAMINOPHEN 325 MG PO TABS
650.0000 mg | ORAL_TABLET | ORAL | Status: DC | PRN
  Administered 2018-06-28 – 2018-06-30 (×4): 650 mg via ORAL
  Filled 2018-06-28 (×3): qty 2

## 2018-06-28 SURGICAL SUPPLY — 20 items
BLANKET WARM UNDERBOD FULL ACC (MISCELLANEOUS) ×2 IMPLANT
CATH MAPPNG PENTARAY F 2-6-2MM (CATHETERS) IMPLANT
CATH SMTCH THERMOCOOL SF DF (CATHETERS) ×2 IMPLANT
CATH SOUNDSTAR 3D IMAGING (CATHETERS) ×2 IMPLANT
CATH SOUNDSTAR ECO REPROCESSED (CATHETERS) ×2 IMPLANT
CATH WEBSTER BI DIR CS D-F CRV (CATHETERS) ×2 IMPLANT
COVER SWIFTLINK CONNECTOR (BAG) ×4 IMPLANT
NDL BAYLIS TRANSSEPTAL 71CM (NEEDLE) IMPLANT
NEEDLE BAYLIS TRANSSEPTAL 71CM (NEEDLE) ×3 IMPLANT
PACK EP LATEX FREE (CUSTOM PROCEDURE TRAY) ×3
PACK EP LF (CUSTOM PROCEDURE TRAY) ×1 IMPLANT
PAD PRO RADIOLUCENT 2001M-C (PAD) ×3 IMPLANT
PATCH CARTO3 (PAD) ×2 IMPLANT
PENTARAY F 2-6-2MM (CATHETERS) ×3
SHEATH AVANTI 11F 11CM (SHEATH) ×2 IMPLANT
SHEATH PINNACLE 4F 10CM (SHEATH) ×2 IMPLANT
SHEATH PINNACLE 7F 10CM (SHEATH) ×4 IMPLANT
SHEATH PINNACLE 9F 10CM (SHEATH) ×2 IMPLANT
SHEATH SWARTZ TS SL2 63CM 8.5F (SHEATH) ×2 IMPLANT
TUBING SMART ABLATE COOLFLOW (TUBING) ×2 IMPLANT

## 2018-06-28 NOTE — Anesthesia Postprocedure Evaluation (Signed)
Anesthesia Post Note  Patient: Wanda [name]  Procedure(s) Performed: ATRIAL FIBRILLATION ABLATION (N/A )     Patient location during evaluation: Endoscopy Anesthesia Type: General Level of consciousness: awake and alert, patient cooperative and oriented Pain management: pain level controlled Vital Signs Assessment: post-procedure vital signs reviewed and stable Respiratory status: spontaneous breathing, nonlabored ventilation, respiratory function stable and patient connected to nasal cannula oxygen Cardiovascular status: blood pressure returned to baseline and stable : nausea improving. Anesthetic complications: no    Last Vitals:  Vitals:   06/28/18 1052 06/28/18 1149  BP: (!) 76/59 (!) 79/49  Pulse: (!) 57 (!) 56  Resp:  14  Temp: (!) 36.1 C (!) 36.2 C  SpO2:      Last Pain:  Vitals:   06/28/18 1149  TempSrc: Temporal  PainSc: 4                  Jacobs Golab,E. Chananya Canizalez

## 2018-06-28 NOTE — Plan of Care (Signed)
  Problem: Clinical Measurements: Goal: Ability to maintain clinical measurements within normal limits will improve Outcome: Progressing   Problem: Health Behavior/Discharge Planning: Goal: Ability to manage health-related needs will improve Outcome: Progressing   Problem: Education: Goal: Knowledge of General Education information will improve Description Including pain rating scale, medication(s)/side effects and non-pharmacologic comfort measures Outcome: Progressing   Problem: Clinical Measurements: Goal: Diagnostic test results will improve Outcome: Progressing   Problem: Activity: Goal: Risk for activity intolerance will decrease Outcome: Progressing   Problem: Safety: Goal: Ability to remain free from injury will improve Outcome: Progressing   Problem: Skin Integrity: Goal: Risk for impaired skin integrity will decrease Outcome: Progressing   Problem: Cardiac: Goal: Ability to achieve and maintain adequate cardiopulmonary perfusion will improve Outcome: Progressing   Problem: Activity: Goal: Ability to tolerate increased activity will improve Outcome: Progressing

## 2018-06-28 NOTE — CV Procedure (Signed)
    4 Fr R F/A, and 7 Fr,9 Fr and a 11 Fr. Sheaths were removed, and manual pressure was held for 30 min total. Sterile gauze was applied at the site. R groin is soft and non tender after procedure. R DP and PT were dopplerable before and after the sheath pull.  Bed rest started at 1400 X 6 hr. Instructions were given to the patient about her limitations during the bed rest.  HR 69 NSR BP 106/65 sPO2 98% on R/A

## 2018-06-28 NOTE — Anesthesia Procedure Notes (Signed)
Procedure Name: Intubation Date/Time: 06/28/2018 8:12 AM Performed by: Glynda Jaeger, CRNA Pre-anesthesia Checklist: Patient identified, Patient being monitored, Timeout performed, Emergency Drugs available and Suction available Patient Re-evaluated:Patient Re-evaluated prior to induction Oxygen Delivery Method: Circle System Utilized Preoxygenation: Pre-oxygenation with 100% oxygen Induction Type: IV induction Laryngoscope Size: Mac and 3 Grade View: Grade II Tube type: Oral Tube size: 7.0 mm Number of attempts: 1 Airway Equipment and Method: Stylet and Bougie stylet Placement Confirmation: ETT inserted through vocal cords under direct vision,  positive ETCO2 and breath sounds checked- equal and bilateral Secured at: 21 cm Tube secured with: Tape Dental Injury: Teeth and Oropharynx as per pre-operative assessment  Comments: RSI

## 2018-06-28 NOTE — Transfer of Care (Signed)
Immediate Anesthesia Transfer of Care Note  Patient: Wanda [name]  Procedure(s) Performed: ATRIAL FIBRILLATION ABLATION (N/A )  Patient Location: Cath Lab  Anesthesia Type:General  Level of Consciousness: awake, patient cooperative and responds to stimulation  Airway & Oxygen Therapy: Patient Spontanous Breathing and Patient connected to face mask oxygen  Post-op Assessment: Report given to RN, Post -op Vital signs reviewed and stable and Patient moving all extremities X 4  Post vital signs: Reviewed and stable  Last Vitals:  Vitals Value Taken Time  BP 55/23 06/28/2018 11:00 AM  Temp 36.1 C 06/28/2018 10:52 AM  Pulse 57 06/28/2018 11:04 AM  Resp 33 06/28/2018 11:04 AM  SpO2 99 % 06/28/2018 11:04 AM  Vitals shown include unvalidated device data.  Last Pain:  Vitals:   06/28/18 1052  TempSrc: Temporal  PainSc: 4          Complications: No apparent anesthesia complications

## 2018-06-28 NOTE — Interval H&P Note (Signed)
History and Physical Interval Note:  06/28/2018 7:17 AM  Wanda [name]  has presented today for surgery, with the diagnosis of A fib.  The various methods of treatment have been discussed with the patient and family. After consideration of risks, benefits and other options for treatment, the patient has consented to TEE and  Procedure(s): ATRIAL FIBRILLATION ABLATION (N/A) as a surgical intervention.  The patient's history has been reviewed, patient examined, no change in status, stable for surgery.  I have reviewed the patient's chart and labs.  Questions were answered to the patient's satisfaction.     Hillis Range

## 2018-06-28 NOTE — Progress Notes (Addendum)
Patient c/o back pain, Dr Johney Frame made aware -- orders for Tylenol PO.  No relief from Tylenol after approximately 45 mins. Patient states that the pain is like pain she has had before, states bad arthritis in low back. VVS -- BP cuff does not correlate to femoral arterial line (106/58 IBP - 78/38 Cuff BP).  Awaiting ACT <175 to pull sheaths. Patient turned slightly off back to her right  and pillow placed under left hip with some relief for a brief time. Both heat and ice packs have been applied respectively with only minimal relief.  ACT rechecked now, pending.   1253 Dr Johney Frame paged for further plan and orders.   1255 -- returned page. Orders for Fentanyl 25 mcg, see patient's existing order for Ativan as needed, if BP wnl with fentanyl, ok to removed all sheaths and trend cuff BPs.

## 2018-06-28 NOTE — Progress Notes (Signed)
  Echocardiogram Echocardiogram Transesophageal has been performed.  [name] 06/28/2018, 9:11 AM

## 2018-06-28 NOTE — Progress Notes (Signed)
MD on call paged BP 85/64 HR 104

## 2018-06-29 ENCOUNTER — Encounter (HOSPITAL_COMMUNITY): Payer: Self-pay | Admitting: Internal Medicine

## 2018-06-29 DIAGNOSIS — N179 Acute kidney failure, unspecified: Secondary | ICD-10-CM

## 2018-06-29 LAB — MAGNESIUM: Magnesium: 2.4 mg/dL (ref 1.7–2.4)

## 2018-06-29 LAB — BASIC METABOLIC PANEL
Anion gap: 8 (ref 5–15)
BUN: 25 mg/dL — ABNORMAL HIGH (ref 8–23)
CO2: 26 mmol/L (ref 22–32)
Calcium: 9.3 mg/dL (ref 8.9–10.3)
Chloride: 105 mmol/L (ref 98–111)
Creatinine, Ser: 1.37 mg/dL — ABNORMAL HIGH (ref 0.44–1.00)
GFR calc Af Amer: 46 mL/min — ABNORMAL LOW (ref >60)
GFR calc non Af Amer: 40 mL/min — ABNORMAL LOW (ref >60)
Glucose, Bld: 121 mg/dL — ABNORMAL HIGH (ref 70–99)
Potassium: 4.4 mmol/L (ref 3.5–5.1)
Sodium: 139 mmol/L (ref 135–145)

## 2018-06-29 MED ORDER — SPIRONOLACTONE 12.5 MG HALF TABLET
12.5000 mg | ORAL_TABLET | Freq: Every day | ORAL | Status: DC
  Administered 2018-06-29 – 2018-06-30 (×2): 12.5 mg via ORAL
  Filled 2018-06-29 (×2): qty 1

## 2018-06-29 MED ORDER — PHENOL 1.4 % MT LIQD: 1.0000 | OROMUCOSAL | Status: DC | PRN

## 2018-06-29 NOTE — Care Management Important Message (Signed)
Important Message  Patient Details  Name: Wanda [name] MRN: [id number] Date of Birth: [birth year]   Medicare Important Message Given:  Yes    Si Jachim Stefan Church 06/29/2018, 3:58 PM

## 2018-06-29 NOTE — Progress Notes (Signed)
Progress Note  Patient Name: Wanda [name] Date of Encounter: 06/29/2018  Primary Cardiologist: Tonny Bollman, MD   Subjective   Feeling tired today.  Denies chest pain or shortness of breath.  Inpatient Medications    Scheduled Meds: . apixaban  5 mg Oral BID  . sodium chloride flush  3 mL Intravenous Q12H   Continuous Infusions: . sodium chloride     PRN Meds: sodium chloride, acetaminophen, fentaNYL (SUBLIMAZE) injection, LORazepam, nitroGLYCERIN, ondansetron (ZOFRAN) IV, sodium chloride flush   Vital Signs    Vitals:   06/28/18 1928 06/28/18 2358 06/29/18 0400 06/29/18 0809  BP:   100/69 98/66  Pulse:   71   Resp:    16  Temp: 97.6 F (36.4 C) 98.4 F (36.9 C) 97.8 F (36.6 C) 98.2 F (36.8 C)  TempSrc: Oral Oral Oral Oral  SpO2:   98%   Weight:   69.3 kg   Height:        Intake/Output Summary (Last 24 hours) at 06/29/2018 1018 Last data filed at 06/28/2018 2100 Gross per 24 hour  Intake -  Output 410 ml  Net -410 ml   Last 3 Weights 06/29/2018 06/28/2018 06/27/2018  Weight (lbs) 152 lb 11.2 oz 151 lb 3.8 oz 148 lb 12.8 oz  Weight (kg) 69.264 kg 68.6 kg 67.495 kg      Telemetry    Normal sinus rhythm, occasional PACs, occasional PVCs - Personally Reviewed   Physical Exam  Alert, oriented woman in no distress GEN: No acute distress.   Neck: No JVD Cardiac: RRR, no murmurs, rubs, or gallops.  Respiratory: Clear to auscultation bilaterally. GI: Soft, nontender, non-distended  MS: No edema; No deformity. Neuro:  Nonfocal  Psych: Normal affect   Labs    Chemistry Recent Labs  Lab 06/27/18 0551 06/28/18 0432 06/29/18 0351  NA 139 140 139  K 3.6 4.0 4.4  CL 102 104 105  CO2 26 25 26   GLUCOSE 103* 93 121*  BUN 27* 30* 25*  CREATININE 1.39* 1.58* 1.37*  CALCIUM 9.2 9.0 9.3  GFRNONAA 39* 34* 40*  GFRAA 46* 39* 46*  ANIONGAP 11 11 8      Hematology Recent Labs  Lab 06/26/18 1010  WBC 6.9  RBC 4.36  HGB 13.1  HCT 40.8  MCV 93.6   MCH 30.0  MCHC 32.1  RDW 13.0  PLT 224     BNP Recent Labs  Lab 06/26/18 1010  BNP 762.2*     Radiology    No results found.  Cardiac Studies   TEE 06/28/18 IMPRESSIONS    1. The left ventricle has severely reduced systolic function, with an ejection fraction of 20-25%. The cavity size was mildly dilated. Left ventricular diastolic function could not be evaluated.  2. The right ventricle has moderately reduced systolic function. Right ventricular systolic pressure normal.  3. Left atrial size was severely dilated.  4. Right atrial size was moderately dilated.  5. The mitral valve is grossly normal. Mild thickening of the mitral valve leaflet. Mitral valve regurgitation is moderate by color flow Doppler.  6. A bioprosthesis valve is present in the aortic position.  7. Aortic valve regurgitation is trivial by color flow Doppler.  TTE 06/26/18 IMPRESSIONS    1. The left ventricle has severely reduced systolic function, with an ejection fraction of 20-25%. The cavity size was normal. There is mildly increased left ventricular wall thickness. Left ventricular diastolic function could not be evaluated  secondary to atrial fibrillation.  Left ventricular diffuse hypokinesis.  2. Left atrial size was moderately dilated.  3. Right atrial size was moderately dilated.  4. The mitral valve is grossly normal. Mild thickening of the mitral valve leaflet. Mitral valve regurgitation is mild to moderate by color flow Doppler.  5. The tricuspid valve is grossly normal.  6. Aortic valve regurgitation is trivial by color flow Doppler.  7. Severe global reduction in LV systolic function; mild LVH; moderate biatrial elargement; s/p AVR with trace AI; mild to moderate MR.  Patient Profile     Wanda [name] is a 68 y.o. female with a history of chronic combined CHF, non-ischemic dilated cardiomyopathy, persistent atrial fibrillation s/p DCCV in 02/2018, aortic insufficiency s/p bioprosthetic  AVR by Dr. Tyrone Sage in 04/2016, who is being directly admitted from home for acute on chronic CHF and atrial fibrillation with RVR.  Assessment & Plan    1. Acute on chronic combined CHF - Likely secondary to atrial fibrillation with RVR. BNP 762. IV diuresed 2L. Weight is up, seems inaccurate. Scr stable. No lasix today. Echo showed newly reduce LVEF of 20-25% with diffuse hypokinesis. s/p AVR with trace AI; mild to moderate MR.  2. Recurrent Atrial fibrillation with RVR - Symptomatic. She has not well tolerated metoprolol or amiodarone in the past. On Tikosyn. S/p Ablation yesterday.  - On Eliquis for anticoagulation.   3. Acute on CKD stage III - Scr of 1.3-1.5.   Home cardizem CD on hold, would not be good long term medication given reduce LV function. MD to review heart failure medications.   For questions or updates, please contact [facility] Please consult [email or web address] for contact info under     SignedManson Passey, PA  06/29/2018, 10:18 AM    Patient seen, examined. Available data reviewed. Agree with findings, assessment, and plan as outlined by Chelsea Aus, PA.  The physical exam findings documented above reflect my personal findings on today's physical examination.  The patient is maintaining sinus rhythm after undergoing atrial fib ablation yesterday.  She has acute on chronic systolic heart failure with marked deterioration of LV function associated with a tachycardia mediated cardiomyopathy.  The patient's acute kidney injury is somewhat improved today with creatinine down to 1.37 mg/dL.  Unfortunately her blood pressure remains very soft and I am not sure that were going to be able to initiate much therapy for her heart failure.  It might be reasonable to consider starting her on a very low-dose of losartan and-or Spironolactone.  We will plan to observe her overnight for possible discharge home tomorrow morning.  She will need to continue on apixaban without  interruption.  We will arrange outpatient telemedicine visits in the next week.  Tonny Bollman, M.D. 06/29/2018 3:17 PM

## 2018-06-29 NOTE — Progress Notes (Signed)
Progress Note  Patient Name: Wanda [name] Date of Encounter: 06/29/2018  Primary Cardiologist: Tonny Bollman, MD   Subjective   Doing very well post ablation Denies CP or SOB. Feels "weak" but improving with sinus rhythm  Inpatient Medications    Scheduled Meds: . apixaban  5 mg Oral BID  . sodium chloride flush  3 mL Intravenous Q12H   Continuous Infusions: . sodium chloride     PRN Meds: sodium chloride, acetaminophen, fentaNYL (SUBLIMAZE) injection, LORazepam, nitroGLYCERIN, ondansetron (ZOFRAN) IV, sodium chloride flush   Vital Signs    Vitals:   06/28/18 1928 06/28/18 2358 06/29/18 0400 06/29/18 0809  BP:   100/69 98/66  Pulse:   71   Resp:    16  Temp: 97.6 F (36.4 C) 98.4 F (36.9 C) 97.8 F (36.6 C) 98.2 F (36.8 C)  TempSrc: Oral Oral Oral Oral  SpO2:   98%   Weight:   69.3 kg   Height:        Intake/Output Summary (Last 24 hours) at 06/29/2018 0941 Last data filed at 06/28/2018 2100 Gross per 24 hour  Intake -  Output 410 ml  Net -410 ml   Last 3 Weights 06/29/2018 06/28/2018 06/27/2018  Weight (lbs) 152 lb 11.2 oz 151 lb 3.8 oz 148 lb 12.8 oz  Weight (kg) 69.264 kg 68.6 kg 67.495 kg      Telemetry    SR - Personally Reviewed  ECG    SR, QT is improving - Personally Reviewed  Physical Exam   GEN: No acute distress.   Neck: No JVD Cardiac: RRR, no murmurs, rubs, or gallops.  Respiratory: normal WOB . GI: Soft, nontender, non-distended  MS: No edema; No deformity. Neuro:  Nonfocal  Psych: Normal affect  R groin with very small,nontender hematoma.  Moderate ecchymosis    Labs    Chemistry Recent Labs  Lab 06/27/18 0551 06/28/18 0432 06/29/18 0351  NA 139 140 139  K 3.6 4.0 4.4  CL 102 104 105  CO2 26 25 26   GLUCOSE 103* 93 121*  BUN 27* 30* 25*  CREATININE 1.39* 1.58* 1.37*  CALCIUM 9.2 9.0 9.3  GFRNONAA 39* 34* 40*  GFRAA 46* 39* 46*  ANIONGAP 11 11 8      Hematology Recent Labs  Lab 06/26/18 1010  WBC 6.9   RBC 4.36  HGB 13.1  HCT 40.8  MCV 93.6  MCH 30.0  MCHC 32.1  RDW 13.0  PLT 224    Cardiac EnzymesNo results for input(s): TROPONINI in the last 168 hours. No results for input(s): TROPIPOC in the last 168 hours.   BNP Recent Labs  Lab 06/26/18 1010  BNP 762.2*     DDimer No results for input(s): DDIMER in the last 168 hours.   Radiology    No results found.  Cardiac Studies   06/28/2018: TEE IMPRESSIONS  1. The left ventricle has severely reduced systolic function, with an ejection fraction of 20-25%. The cavity size was mildly dilated. Left ventricular diastolic function could not be evaluated.  2. The right ventricle has moderately reduced systolic function. Right ventricular systolic pressure normal.  3. Left atrial size was severely dilated.  4. Right atrial size was moderately dilated.  5. The mitral valve is grossly normal. Mild thickening of the mitral valve leaflet. Mitral valve regurgitation is moderate by color flow Doppler.  6. A bioprosthesis valve is present in the aortic position.  7. Aortic valve regurgitation is trivial by color flow Doppler.  SUMMARY LVEF severely decreased estimated at 20-25%. Moderately decreased RVEF. Bi-Atrial dilatation. No thrombus in the left atrium or left atrial appendage. S/P AVR with a bioprosthesis, trivial AI. Moderate MR. Mild TR.  FINDINGS  Left Ventricle: The left ventricle has severely reduced systolic function, with an ejection fraction of 20-25%. The cavity size was mildly dilated. Left ventricular diastolic function could not be evaluated. Right Ventricle: The right ventricle has moderately reduced systolic function. Right ventricular systolic pressure normal. Left Atrium: Left atrial size was severely dilated. Right Atrium: Right atrial size was moderately dilated. Right atrial pressure is estimated at 10 mmHg. Mitral Valve: The mitral valve is grossly normal. Mild thickening of the mitral valve leaflet. Mitral  valve regurgitation is moderate by color flow Doppler. Tricuspid Valve: The tricuspid valve was normal in structure. Tricuspid valve regurgitation is mild by color flow Doppler. Aortic Valve: Aortic valve regurgitation is trivial by color flow Doppler. A bioprosthetic aortic valve valve is present in the aortic position. Venous: The inferior vena cava was not well visualized.      06/26/2018: TTE IMPRESSIONS  1. The left ventricle has severely reduced systolic function, with an ejection fraction of 20-25%. The cavity size was normal. There is mildly increased left ventricular wall thickness. Left ventricular diastolic function could not be evaluated  secondary to atrial fibrillation. Left ventricular diffuse hypokinesis.  2. Left atrial size was moderately dilated.  3. Right atrial size was moderately dilated.  4. The mitral valve is grossly normal. Mild thickening of the mitral valve leaflet. Mitral valve regurgitation is mild to moderate by color flow Doppler.  5. The tricuspid valve is grossly normal.  6. Aortic valve regurgitation is trivial by color flow Doppler.  7. Severe global reduction in LV systolic function; mild LVH; moderate biatrial elargement; s/p AVR with trace AI; mild to moderate MR.  FINDINGS  Left Ventricle: The left ventricle has severely reduced systolic function, with an ejection fraction of 20-25%. The cavity size was normal. There is mildly increased left ventricular wall thickness. Left ventricular diastolic function could not be  evaluated secondary to atrial fibrillation. Left ventricular diffuse hypokinesis. Right Ventricle: The right ventricle has normal systolic function. The cavity was normal. There is right vetricular wall thickness was not assessed. Left Atrium: Left atrial size was moderately dilated. Right Atrium: Right atrial size was moderately dilated. Right atrial pressure is estimated at 3 mmHg.   Pericardium: There is no evidence of pericardial  effusion. Mitral Valve: The mitral valve is grossly normal. Mild thickening of the mitral valve leaflet. Mitral valve regurgitation is mild to moderate by color flow Doppler. Tricuspid Valve: The tricuspid valve is grossly normal. Tricuspid valve regurgitation is mild by color flow Doppler. Aortic Valve: Aortic valve regurgitation is trivial by color flow Doppler. A bioprosthetic aortic valve valve is present in the aortic position. Pulmonic Valve: The pulmonic valve was not well visualized. Pulmonic valve regurgitation was not assessed by color flow Doppler. Venous: The inferior vena cava is normal in size with greater than 50% respiratory variability. Additional Comments: Severe global reduction in LV systolic function; mild LVH; moderate biatrial elargement; s/p AVR with trace AI; mild to moderate MR.    Echocardiogram 01/23/2018 [facility], [place], [place]) Mild concentric LVH, EF 45-50, normally functioning bioprosthetic aortic valve, mean gradient 12, peak gradient 17, mild MR, moderate LAE  Echo 08/17/2017 Mild concentric LVH, EF 55-60, normal wall motion, grade 1 diastolic dysfunction, normally functioning AVR with mild aortic stenosis (mean 23),  mild MR   Echo 08/10/2016 Diffuse hypokinesis, moderate LVH, EF 40-45, normal diastolic function, mild MR Intraop TEE 05/03/16 EF 35-40, severe AI, mild MR Carotid US 2/18 No ICA stenosis R/L [facility] 1/18 LAD irregs prox 20 Normal R heart hemodynamics Holter 11/17 The basic rhythm is sinus. There are frequent PVCs, periods of ventricular bigeminy, and occasional ventricular couplets and triplets. Echo 11/17 EF 40-45, mild diff HK, Gr 2 DD, possibly bicuspid AV with very mild AS and severe AI, mild MR, mild LAE  Patient Profile     69 y.o. female with a hx of NICM, CHF (systolic)- previously normalized with sinus rhythm, VHD s/p AVR (biopsosthetic) feb 2019 w persistent AFib and development of CM (felt to be 2/2 to he AF with  improvement when in SR)  Admitted with recurrent Rapid AF, CHF, and again reduction in LVEF  Assessment & Plan    1. Persistent AFib     CHA2DS2Vasc is 3, on Eliquis, appropriately dosed     Failed Tikosyn with marked QT prolongation (noted post ablation in SR)     Now s/p EPS/Ablation yesterday with good results  Procedure site stable.  Routine groin care post ablation Continue Eliquis without interruption x 3 months  I will follow up with her by telehealth visit next week Add protonix  daily x 6 weeks for GI protection post PVI ablation  2. Acute/chronic CHF exacerbation Admitted with marked decompensation due to prolonged AF with RVR Anticipate discharge off of medical therapy at discharge due to soft BP I will plan telehealth visit next week and will likely add losartan at that time.  3. AKI Improving Likely due to poor cardiac output/ low output state  4. VHD       Bioprosthetic AVR, functioning well      Observe today Home tomorrow  Hillis Range MD, [facility] 06/29/2018

## 2018-06-30 DIAGNOSIS — I4891 Unspecified atrial fibrillation: Secondary | ICD-10-CM

## 2018-06-30 DIAGNOSIS — I480 Paroxysmal atrial fibrillation: Secondary | ICD-10-CM

## 2018-06-30 LAB — BASIC METABOLIC PANEL
Anion gap: 9 (ref 5–15)
BUN: 23 mg/dL (ref 8–23)
CO2: 24 mmol/L (ref 22–32)
Calcium: 8.7 mg/dL — ABNORMAL LOW (ref 8.9–10.3)
Chloride: 106 mmol/L (ref 98–111)
Creatinine, Ser: 1.19 mg/dL — ABNORMAL HIGH (ref 0.44–1.00)
GFR calc Af Amer: 55 mL/min — ABNORMAL LOW (ref >60)
GFR calc non Af Amer: 48 mL/min — ABNORMAL LOW (ref >60)
Glucose, Bld: 100 mg/dL — ABNORMAL HIGH (ref 70–99)
Potassium: 4.1 mmol/L (ref 3.5–5.1)
Sodium: 139 mmol/L (ref 135–145)

## 2018-06-30 MED ORDER — SPIRONOLACTONE 25 MG PO TABS: 12.5000 mg | ORAL_TABLET | Freq: Every day | ORAL | 6 refills | Status: DC

## 2018-06-30 MED ORDER — PANTOPRAZOLE SODIUM 40 MG PO TBEC: 40.0000 mg | DELAYED_RELEASE_TABLET | Freq: Every day | ORAL | 1 refills | Status: DC

## 2018-06-30 NOTE — Discharge Summary (Addendum)
Discharge Summary    Patient ID: Wanda [name] MRN: [id number]; DOB: 1950-08-26  Admit date: 06/26/2018 Discharge date: 06/30/2018  Primary Care Provider: [place], [name], MD  Primary Cardiologist: Tonny Bollman, MD  Primary Electrophysiologist:  Dr. Johney Frame  Discharge Diagnoses    Principal Problem:   Atrial fibrillation with RVR (HCC) Active Problems:   S/P AVR (aortic valve replacement)   Acute on chronic combined systolic (congestive) and diastolic (congestive) heart failure (HCC)  Acute on CKD III  Allergies Allergies  Allergen Reactions  . Tikosyn [Dofetilide] Other (See Comments)    Prolonged QT, nonsustained torsades at low dose  . Codeine Nausea And Vomiting  . Vicodin [Hydrocodone-Acetaminophen] Nausea And Vomiting    Diagnostic Studies/Procedures    TEE 06/28/18 IMPRESSIONS   1. The left ventricle has severely reduced systolic function, with an ejection fraction of 20-25%. The cavity size was mildly dilated. Left ventricular diastolic function could not be evaluated. 2. The right ventricle has moderately reduced systolic function. Right ventricular systolic pressure normal. 3. Left atrial size was severely dilated. 4. Right atrial size was moderately dilated. 5. The mitral valve is grossly normal. Mild thickening of the mitral valve leaflet. Mitral valve regurgitation is moderate by color flow Doppler. 6. A bioprosthesis valve is present in the aortic position. 7. Aortic valve regurgitation is trivial by color flow Doppler.  TTE 06/26/18 IMPRESSIONS   1. The left ventricle has severely reduced systolic function, with an ejection fraction of 20-25%. The cavity size was normal. There is mildly increased left ventricular wall thickness. Left ventricular diastolic function could not be evaluated  secondary to atrial fibrillation. Left ventricular diffuse hypokinesis. 2. Left atrial size was moderately dilated. 3. Right atrial size was moderately  dilated. 4. The mitral valve is grossly normal. Mild thickening of the mitral valve leaflet. Mitral valve regurgitation is mild to moderate by color flow Doppler. 5. The tricuspid valve is grossly normal. 6. Aortic valve regurgitation is trivial by color flow Doppler. 7. Severe global reduction in LV systolic function; mild LVH; moderate biatrial elargement; s/p AVR with trace AI; mild to moderate MR.    Afib Ablation 06/28/18 CONCLUSIONS: 1. Atrial fibrillation upon presentation.  The patient was noted to be quite ill with acute decompensated CHF and TEE revealing EF of 10%.  She was found to have RVR as well as systemic hypotension requiring inotropic support for the procedure today.  She was cardioverted at the beginning of the procedure after TEE revealed no LAA thrombus due to hemodynamic instability. 2. Intracardiac echo reveals a severely enlarged left atrium with four separate pulmonary veins without evidence of pulmonary vein stenosis. 3. Successful electrical isolation and anatomical encircling of all four pulmonary veins with radiofrequency current.  A WACA approach was used 3. Additional left atrial ablation was performed with a standard box lesion created along the posterior wall of the left atrium 4. No early apparent complications.   History of Present Illness     Wanda [name] a 68 y.o.femalewith a history of chronic combined CHF, non-ischemic dilated cardiomyopathy, persistent atrial fibrillation s/p DCCV in 02/2018, aortic insufficiency s/p bioprosthetic AVR by Dr. Tyrone Sage in 04/2016, who is being directly admitted from home for acute on chronic CHF and atrial fibrillation with RVR.  She underwent bioprosthetic AVRby Dr. Tyrone Sage in February 2018. Postoperative course was complicated by atrial fibrillation with a rapid rate requiring IV Amiodarone. Echo in June 2019 demonstrated improved LV function with an EF of 55-60% and mild diastolic dysfunction.  She was seen by  Dr. Excell Seltzer on 02/01/18 after an admission to a hospital [facility] fibrillationwith rapid rate complicated bycongestive heart failure. She was started on Apixaban for anticoagulationand Diltiazem and Metoprolol for rate control. Echo done in the hospital in [place] demonstrated an EF of 45-50%and a normally functioning aortic valve prosthesis. Elective cardioversion was arranged after 3 weeks of uninterrupted anticoagulation. She underwent cardioversion on 02/16/2018 with restoration of normal sinus rhythm (after 4 attempts-120 J, 150 J, 200 J x 2). Patient was seen by Tereso Newcomer, PA-C, on 03/03/2019 for follow-up at which time she was noted to be back in atrial fibrillation. She was referred to the [facility] with plans to start Tikosyn; however, this was postponed due to the holidays and the current COVID-19 pandemic.  Dr. Excell Seltzer spoke with the patient 4/13 after patient reached out with worsening symptoms. Patient has developed progressive symptoms of shortness of breath both at rest and with exertion, orthopnea, dizziness, and generalized weakness. Therefore, decision was made to directly admit the patient.   Hospital Course     Consultants: EP  1. Acute on chronic combined CHF - Likely secondary to atrial fibrillation with RVR. BNP 762. IV diuresed 2L. Weight is up, seems inaccurate. Scr stable. Echo showed newly reduce LVEF of 20-25% with diffuse hypokinesis. s/p AVR with trace AI; mild to moderate MR. Felt tachycardia mediated cardiomyopathy.  BP soft to titrate heart failure therapy. Added Aldactone 12.5mg  daily. Plan to add ARB at follow up. Intolerance to metoprolol in past. May consider another BB agent as outpatient. Discontinued Cardizem given low LVEF.  2. Recurrent Atrial fibrillation with RVR - Symptomatic. She has not well tolerated metoprolol or amiodarone in the past. On Tikosyn. Seen by Dr. Johney Frame S/p Ablation. Per note, Failed  Tikosyn with marked QT prolongation (noted post ablation in SR). Procedure site stable. Added protonix 40mg  daily x 6 weeks for GI protection post PVI ablation. On Eliquis for anticoagulation.   3. Acute on CKD stage III - Scr of 1.3-1.5. improved to 1.19 at discharge. likely due to poor cardiac output/low output state.    The patient been seen by Dr. Eden Emms  today and deemed ready for discharge home. All follow-up appointments have been scheduled. Discharge medications are listed below.  Discharge Vitals Blood pressure 98/68, pulse 74, temperature 98.2 F (36.8 C), temperature source Oral, resp. rate 16, height 5\' 4"  (1.626 m), weight 69.5 kg, last menstrual period 08/12/2004, SpO2 94 %.  Filed Weights   06/28/18 0300 06/29/18 0400 06/30/18 0609  Weight: 68.6 kg 69.3 kg 69.5 kg    Labs & Radiologic Studies    CBC No results for input(s): WBC, NEUTROABS, HGB, HCT, MCV, PLT in the last 72 hours. Basic Metabolic Panel Recent Labs    12/04/28 0432 06/29/18 0351 06/30/18 0343  NA 140 139 139  K 4.0 4.4 4.1  CL 104 105 106  CO2 25 26 24   GLUCOSE 93 121* 100*  BUN 30* 25* 23  CREATININE 1.58* 1.37* 1.19*  CALCIUM 9.0 9.3 8.7*  MG 2.3 2.4  --    Thyroid Function Tests No results for input(s): TSH, T4TOTAL, T3FREE, THYROIDAB in the last 72 hours.  Invalid input(s): FREET3 _____________  Dg Chest Port 1 View  Result Date: 06/26/2018 CLINICAL DATA:  Shortness of breath. EXAM: PORTABLE CHEST 1 VIEW COMPARISON:  Radiographs of June 16, 2016. FINDINGS: Stable cardiomediastinal silhouette. Status post aortic valve repair. No pneumothorax or pleural effusion is  noted. Both lungs are clear. The visualized skeletal structures are unremarkable. IMPRESSION: No active disease. Electronically Signed   By: Lupita Raider M.D.   On: 06/26/2018 10:21   Disposition   Pt is being discharged home today in good condition.  Follow-up Plans & Appointments    Follow-up Information    Hillis Range, MD Follow up.   Specialty:  Cardiology Why:  07/04/2018 @ 9:30AM.  This will be a video/virtual visit, please see your discharge instructions to aid in this process Contact information: [address] [place] [address] [phone]        [facility] Follow up.   Specialty:  Cardiology Why:  07/30/2018 @ 9:30AM, this will be a video/virtual visit with Rudi Coco, NP Contact information: [address] [id number] mc [place] [address] [phone]         Discharge Instructions    Diet - low sodium heart healthy   Complete by:  As directed    Increase activity slowly   Complete by:  As directed       Discharge Medications   Allergies as of 06/30/2018      Reactions   Tikosyn [dofetilide] Other (See Comments)   Prolonged QT, nonsustained torsades at low dose   Codeine Nausea And Vomiting   Vicodin [hydrocodone-acetaminophen] Nausea And Vomiting      Medication List    STOP taking these medications   diltiazem 240 MG 24 hr capsule Commonly known as:  CARDIZEM CD   metoprolol tartrate 25 MG tablet Commonly known as:  LOPRESSOR     TAKE these medications   acetaminophen 500 MG tablet Commonly known as:  TYLENOL Take 500-1,000 mg by mouth every 6 (six) hours as needed (for pain.).   apixaban 5 MG Tabs tablet Commonly known as:  ELIQUIS Take 1 tablet (5 mg total) by mouth 2 (two) times daily.   furosemide 20 MG tablet Commonly known as:  LASIX Take 1 tablet (20 mg total) by mouth daily as needed for edema.   LORazepam 0.5 MG tablet Commonly known as:  ATIVAN Take 0.5 mg by mouth daily as needed for anxiety.   MELATONIN PO Take 1 tablet by mouth at bedtime as needed (sleep).   pantoprazole 40 MG tablet Commonly known as:  Protonix Take 1 tablet (40 mg total) by mouth daily.   spironolactone 25 MG tablet Commonly known as:  ALDACTONE Take 0.5 tablets (12.5 mg total) by mouth daily.  Start taking on:  July 01, 2018        Acute coronary syndrome (MI, NSTEMI, STEMI, etc) this admission?: No.    Outstanding Labs/Studies   None  Duration of Discharge Encounter   Greater than 30 minutes including physician time.  Lorelei Pont, PA 06/30/2018, 8:42 AM

## 2018-06-30 NOTE — Progress Notes (Signed)
Progress Note  Patient Name: Wanda [name] Date of Encounter: 06/30/2018  Primary Cardiologist: Tonny Bollman, MD   Subjective   Feeling better Feels like she has some fluid retention. Discussed low sodium diet   Inpatient Medications    Scheduled Meds: . apixaban  5 mg Oral BID  . sodium chloride flush  3 mL Intravenous Q12H  . spironolactone  12.5 mg Oral Daily   Continuous Infusions: . sodium chloride     PRN Meds: sodium chloride, acetaminophen, fentaNYL (SUBLIMAZE) injection, LORazepam, nitroGLYCERIN, ondansetron (ZOFRAN) IV, phenol, sodium chloride flush   Vital Signs    Vitals:   06/29/18 1156 06/29/18 1601 06/29/18 2157 06/30/18 0609  BP: (!) 82/52 98/72 (!) 86/57 98/68  Pulse: 73 74 75 74  Resp: 16 16 16 16   Temp: 98.2 F (36.8 C) 98.4 F (36.9 C) 98.1 F (36.7 C) 98.2 F (36.8 C)  TempSrc: Oral Oral Oral Oral  SpO2:   96% 94%  Weight:    69.5 kg  Height:       No intake or output data in the 24 hours ending 06/30/18 0748 Last 3 Weights 06/30/2018 06/29/2018 06/28/2018  Weight (lbs) 153 lb 4.8 oz 152 lb 11.2 oz 151 lb 3.8 oz  Weight (kg) 69.536 kg 69.264 kg 68.6 kg      Telemetry    SR - Personally Reviewed  ECG    SR, QT is improving - Personally Reviewed  Physical Exam  BP 98/68 (BP Location: Right Arm)   Pulse 74   Temp 98.2 F (36.8 C) (Oral)   Resp 16   Ht 5\' 4"  (1.626 m)   Wt 69.5 kg   LMP 08/12/2004   SpO2 94%   BMI 26.31 kg/m   Affect appropriate Healthy:  appears stated age HEENT: normal Neck supple with no adenopathy JVP normal no bruits no thyromegaly Lungs clear with no wheezing and good diaphragmatic motion Heart:  S1/S2 no murmur, no rub, gallop or click PMI normal Abdomen: benighn, BS positve, no tenderness, no AAA no bruit.  No HSM or HJR Distal pulses intact with no bruits No edema Neuro non-focal Skin warm and dry No muscular weakness Right FV mild bruising from ablation     Labs    Chemistry Recent  Labs  Lab 06/28/18 0432 06/29/18 0351 06/30/18 0343  NA 140 139 139  K 4.0 4.4 4.1  CL 104 105 106  CO2 25 26 24   GLUCOSE 93 121* 100*  BUN 30* 25* 23  CREATININE 1.58* 1.37* 1.19*  CALCIUM 9.0 9.3 8.7*  GFRNONAA 34* 40* 48*  GFRAA 39* 46* 55*  ANIONGAP 11 8 9      Hematology Recent Labs  Lab 06/26/18 1010  WBC 6.9  RBC 4.36  HGB 13.1  HCT 40.8  MCV 93.6  MCH 30.0  MCHC 32.1  RDW 13.0  PLT 224    Cardiac EnzymesNo results for input(s): TROPONINI in the last 168 hours. No results for input(s): TROPIPOC in the last 168 hours.   BNP Recent Labs  Lab 06/26/18 1010  BNP 762.2*     DDimer No results for input(s): DDIMER in the last 168 hours.   Radiology    No results found.  Cardiac Studies   06/28/2018: TEE IMPRESSIONS  1. The left ventricle has severely reduced systolic function, with an ejection fraction of 20-25%. The cavity size was mildly dilated. Left ventricular diastolic function could not be evaluated.  2. The right ventricle has moderately reduced  systolic function. Right ventricular systolic pressure normal.  3. Left atrial size was severely dilated.  4. Right atrial size was moderately dilated.  5. The mitral valve is grossly normal. Mild thickening of the mitral valve leaflet. Mitral valve regurgitation is moderate by color flow Doppler.  6. A bioprosthesis valve is present in the aortic position.  7. Aortic valve regurgitation is trivial by color flow Doppler.  SUMMARY LVEF severely decreased estimated at 20-25%. Moderately decreased RVEF. Bi-Atrial dilatation. No thrombus in the left atrium or left atrial appendage. S/P AVR with a bioprosthesis, trivial AI. Moderate MR. Mild TR.  FINDINGS  Left Ventricle: The left ventricle has severely reduced systolic function, with an ejection fraction of 20-25%. The cavity size was mildly dilated. Left ventricular diastolic function could not be evaluated. Right Ventricle: The right ventricle has  moderately reduced systolic function. Right ventricular systolic pressure normal. Left Atrium: Left atrial size was severely dilated. Right Atrium: Right atrial size was moderately dilated. Right atrial pressure is estimated at 10 mmHg. Mitral Valve: The mitral valve is grossly normal. Mild thickening of the mitral valve leaflet. Mitral valve regurgitation is moderate by color flow Doppler. Tricuspid Valve: The tricuspid valve was normal in structure. Tricuspid valve regurgitation is mild by color flow Doppler. Aortic Valve: Aortic valve regurgitation is trivial by color flow Doppler. A bioprosthetic aortic valve valve is present in the aortic position. Venous: The inferior vena cava was not well visualized.      06/26/2018: TTE IMPRESSIONS  1. The left ventricle has severely reduced systolic function, with an ejection fraction of 20-25%. The cavity size was normal. There is mildly increased left ventricular wall thickness. Left ventricular diastolic function could not be evaluated  secondary to atrial fibrillation. Left ventricular diffuse hypokinesis.  2. Left atrial size was moderately dilated.  3. Right atrial size was moderately dilated.  4. The mitral valve is grossly normal. Mild thickening of the mitral valve leaflet. Mitral valve regurgitation is mild to moderate by color flow Doppler.  5. The tricuspid valve is grossly normal.  6. Aortic valve regurgitation is trivial by color flow Doppler.  7. Severe global reduction in LV systolic function; mild LVH; moderate biatrial elargement; s/p AVR with trace AI; mild to moderate MR.  FINDINGS  Left Ventricle: The left ventricle has severely reduced systolic function, with an ejection fraction of 20-25%. The cavity size was normal. There is mildly increased left ventricular wall thickness. Left ventricular diastolic function could not be  evaluated secondary to atrial fibrillation. Left ventricular diffuse hypokinesis. Right Ventricle: The  right ventricle has normal systolic function. The cavity was normal. There is right vetricular wall thickness was not assessed. Left Atrium: Left atrial size was moderately dilated. Right Atrium: Right atrial size was moderately dilated. Right atrial pressure is estimated at 3 mmHg.   Pericardium: There is no evidence of pericardial effusion. Mitral Valve: The mitral valve is grossly normal. Mild thickening of the mitral valve leaflet. Mitral valve regurgitation is mild to moderate by color flow Doppler. Tricuspid Valve: The tricuspid valve is grossly normal. Tricuspid valve regurgitation is mild by color flow Doppler. Aortic Valve: Aortic valve regurgitation is trivial by color flow Doppler. A bioprosthetic aortic valve valve is present in the aortic position. Pulmonic Valve: The pulmonic valve was not well visualized. Pulmonic valve regurgitation was not assessed by color flow Doppler. Venous: The inferior vena cava is normal in size with greater than 50% respiratory variability. Additional Comments: Severe global reduction in LV systolic  function; mild LVH; moderate biatrial elargement; s/p AVR with trace AI; mild to moderate MR.    Echocardiogram 01/23/2018 [facility], [place], [place]) Mild concentric LVH, EF 45-50, normally functioning bioprosthetic aortic valve, mean gradient 12, peak gradient 17, mild MR, moderate LAE  Echo 08/17/2017 Mild concentric LVH, EF 55-60, normal wall motion, grade 1 diastolic dysfunction, normally functioning AVR with mild aortic stenosis (mean 23), mild MR   Echo 08/10/2016 Diffuse hypokinesis, moderate LVH, EF 40-45, normal diastolic function, mild MR Intraop TEE 05/03/16 EF 35-40, severe AI, mild MR Carotid US 2/18 No ICA stenosis R/L [facility] 1/18 LAD irregs prox 20 Normal R heart hemodynamics Holter 11/17 The basic rhythm is sinus. There are frequent PVCs, periods of ventricular bigeminy, and occasional ventricular couplets and triplets.  Echo 11/17 EF 40-45, mild diff HK, Gr 2 DD, possibly bicuspid AV with very mild AS and severe AI, mild MR, mild LAE  Patient Profile     68 y.o. female with a hx of NICM, CHF (systolic)- previously normalized with sinus rhythm, VHD s/p AVR (biopsosthetic) feb 2019 w persistent AFib and development of CM (felt to be 2/2 to he AF with improvement when in SR)  Admitted with recurrent Rapid AF, CHF, and again reduction in LVEF post repeat ablation   Assessment & Plan    1. Persistent AFib CHADVASC 3 on eliquis post ablation in NSR improved JA wanted protonix for 6 weeks for GI protection post PVI ablation   2. Acute/chronic CHF exacerbation Rx limited by soft BP JA to add ARB as outpatient d/c with aldactone 12.5 daily  3. AKI Improving Likely due to poor cardiac output/ low output state Cr 1.19 this am   4. VHD       Bioprosthetic AVR, functioning well      D/C home today  Charlton Haws

## 2018-07-02 ENCOUNTER — Telehealth: Payer: Self-pay

## 2018-07-03 NOTE — Telephone Encounter (Signed)
Spoke with pt regarding appt on 07/04/18. Pt stated she can not upload EKG, but will check vitals prior to appt. Pt questions and concerns were address.

## 2018-07-04 ENCOUNTER — Telehealth (INDEPENDENT_AMBULATORY_CARE_PROVIDER_SITE_OTHER): Payer: Medicare Other | Admitting: Internal Medicine

## 2018-07-04 VITALS — BP 93/80 | HR 121

## 2018-07-04 DIAGNOSIS — I5023 Acute on chronic systolic (congestive) heart failure: Secondary | ICD-10-CM | POA: Diagnosis not present

## 2018-07-04 DIAGNOSIS — I428 Other cardiomyopathies: Secondary | ICD-10-CM | POA: Diagnosis not present

## 2018-07-04 DIAGNOSIS — I4819 Other persistent atrial fibrillation: Secondary | ICD-10-CM

## 2018-07-04 NOTE — Progress Notes (Signed)
Electrophysiology TeleHealth Note   Due to national recommendations of social distancing due to COVID 19, an audio/video telehealth visit is felt to be most appropriate for this patient at this time.  See MyChart message from today for the patient's consent to telehealth for [facility].   Date:  07/04/2018   ID:  Wanda [name], DOB 1950/03/29, MRN [id number]  Location: patient's home  Provider location: [address] [place]  Evaluation Performed: Follow-up visit  PCP:  [place], [name], MD  Cardiologist:  Tonny Bollman, MD  Electrophysiologist:  Dr Johney Frame  Chief Complaint:  afib  History of Present Illness:    Wanda [name] is a 68 y.o. female who presents via audio/video conferencing for a telehealth visit today.  Since her recent ablation, the patient reports doing reaspnably well.  SOB is stable.  BP remains low.  She has gained about 4 lbs since hospital discharge.  Her heart rates are at times elevated (120s) though at other tiimes 50s. Today, she denies symptoms of palpitations, chest pain, shortness of breath,  lower extremity edema, dizziness, presyncope, or syncope.  The patient is otherwise without complaint today.  The patient denies symptoms of fevers, chills, cough, or new SOB worrisome for COVID 19.  Past Medical History:  Diagnosis Date  . Anxiety   . Arthritis 2018   hands have general aches  . CHF (congestive heart failure) (HCC) 2018   has shortness of breath since CHF diagnosis and has increased recently; states this is reason for admission  . Chronic systolic heart failure (HCC) 05/25/2016   tachycardia mediated,  resolved previously with sinus rhythm  . Depression   . Dyspnea 2018  . Migraine   . Nonischemic cardiomyopathy (HCC)    tachycardia mediated,  resolved previously with sinus rhythm  . Persistent atrial fibrillation 05/13/2016  . PVC's (premature ventricular contractions) 05/25/2016   Holter 11/17 The basic rhythm is sinus.  There are frequent PVCs, periods of ventricular bigeminy, and occasional ventricular couplets and triplets.  . S/P AVR (aortic valve replacement)     Past Surgical History:  Procedure Laterality Date  . AORTIC VALVE REPLACEMENT N/A 05/03/2016   Procedure: AORTIC VALVE REPLACEMENT (AVR) WITH MAGNA EASE PERICARDIAL BIOPROSTHESIS - AORTIC 21 MM MODEL 3300TFX;  Surgeon: [name], MD;  Location: [facility] OR;  Service: Open Heart Surgery;  Laterality: N/A;  . ATRIAL FIBRILLATION ABLATION N/A 06/28/2018   Procedure: ATRIAL FIBRILLATION ABLATION;  Surgeon: Hillis Range, MD;  [facility]: [facility] INVASIVE CV LAB;  Service: Cardiovascular;  Laterality: N/A;  . CARDIOVERSION N/A 02/16/2018   Procedure: CARDIOVERSION;  Surgeon: Lars Masson, MD;  Location: [facility] ENDOSCOPY;  Service: Cardiovascular;  Laterality: N/A;  . EYE SURGERY  2005   Lasik  . TEE WITHOUT CARDIOVERSION N/A 05/03/2016   Procedure: TRANSESOPHAGEAL ECHOCARDIOGRAM (TEE);  Surgeon: [name], MD;  Location: [facility] OR;  Service: Open Heart Surgery;  Laterality: N/A;  . TONSILLECTOMY AND ADENOIDECTOMY      Current Outpatient Medications  Medication Sig Dispense Refill  . acetaminophen (TYLENOL) 500 MG tablet Take 500-1,000 mg by mouth every 6 (six) hours as needed (for pain.).    Marland Kitchen apixaban (ELIQUIS) 5 MG TABS tablet Take 1 tablet (5 mg total) by mouth 2 (two) times daily. 60 tablet 11  . furosemide (LASIX) 20 MG tablet Take 20 mg by mouth as needed for fluid or edema.    Marland Kitchen LORazepam (ATIVAN) 0.5 MG tablet Take 0.5 mg by  mouth daily as needed for anxiety.     Marland Kitchen MELATONIN PO Take 1 tablet by mouth at bedtime as needed (sleep).    . pantoprazole (PROTONIX) 40 MG tablet Take 1 tablet (40 mg total) by mouth daily. 30 tablet 1  . spironolactone (ALDACTONE) 25 MG tablet Take 0.5 tablets (12.5 mg total) by mouth daily. 30 tablet 6   No current facility-administered medications for this visit.     Allergies:   Tikosyn [dofetilide]; Codeine;  and Vicodin [hydrocodone-acetaminophen]   Social History:  The patient  reports that she has quit smoking. She has never used smokeless tobacco. She reports current alcohol use. She reports that she does not use drugs.   Family History:  The patient's  family history includes Breast cancer (age of onset: 15) in her sister; Congestive Heart Failure in her maternal grandfather; Deep vein thrombosis in her brother, father, paternal aunt, and paternal grandmother; Diabetes in her mother; Heart disease in her brother; Lung cancer in her brother; Ovarian cancer (age of onset: 1) in her maternal grandmother.   ROS:  Please see the history of present illness.   All other systems are personally reviewed and negative.    Exam:    Vital Signs:  BP 93/80   Pulse (!) 121   LMP 08/12/2004   Well appearing, alert and conversant, regular work of breathing,  good skin color Eyes- anicteric, neuro- grossly intact, skin- no apparent rash or lesions or cyanosis, mouth- oral mucosa is pink   Labs/Other Tests and Data Reviewed:    Recent Labs: 06/26/2018: B Natriuretic Peptide 762.2; Hemoglobin 13.1; Platelets 224 06/29/2018: Magnesium 2.4 06/30/2018: BUN 23; Creatinine, Ser 1.19; Potassium 4.1; Sodium 139   Wt Readings from Last 3 Encounters:  06/30/18 153 lb 4.8 oz (69.5 kg)  03/05/18 151 lb (68.5 kg)  03/02/18 153 lb 12.8 oz (69.8 kg)     Other studies personally reviewed: Additional studies/ records that were reviewed today include: my prior procedure notes,  Hospital records  Review of the above records today demonstrates: as above    ASSESSMENT & PLAN:    1.  Persistent afib She is having some ERAF, but also sinus at times. I am hopefully that she will continue to recover and hopefully proceed towards sinus.  We could consider short term amiodarone if needed. She reports compliance with anticoagulation. I have advised either [employer] or [employer].  She will look into these.   2. Acute  on chronic systolic dysfunction BP remains low, limited medical options No changes today She will take lasix when needed Consider adding ACEi and Coreg when her BP allows   COVID 19 screen The patient denies symptoms of COVID 19 at this time.  The importance of social distancing was discussed today.  Follow-up:  With me on Monday for another telehealth visit  Current medicines are reviewed at length with the patient today.   The patient does not have concerns regarding her medicines.  The following changes were made today:  none  Labs/ tests ordered today include:  No orders of the defined types were placed in this encounter.   Patient Risk:  after full review of this patients clinical status, I feel that they are at high risk at this time.  Today, I have spent 20 minutes with the patient with telehealth technology discussing afib .    Randolm Idol, MD  07/04/2018 10:12 AM     [facility] [address] [place]  [address] [phone] (office) [phone] (fax)

## 2018-07-09 ENCOUNTER — Telehealth (INDEPENDENT_AMBULATORY_CARE_PROVIDER_SITE_OTHER): Payer: Medicare Other | Admitting: Internal Medicine

## 2018-07-09 ENCOUNTER — Encounter: Payer: Self-pay | Admitting: Internal Medicine

## 2018-07-09 ENCOUNTER — Other Ambulatory Visit: Payer: Self-pay

## 2018-07-09 VITALS — BP 89/72 | HR 64

## 2018-07-09 DIAGNOSIS — I5042 Chronic combined systolic (congestive) and diastolic (congestive) heart failure: Secondary | ICD-10-CM

## 2018-07-09 DIAGNOSIS — I4819 Other persistent atrial fibrillation: Secondary | ICD-10-CM

## 2018-07-09 DIAGNOSIS — I428 Other cardiomyopathies: Secondary | ICD-10-CM

## 2018-07-09 MED ORDER — AMIODARONE HCL 200 MG PO TABS: 200.0000 mg | ORAL_TABLET | Freq: Every day | ORAL | 3 refills | Status: DC

## 2018-07-09 NOTE — Progress Notes (Signed)
Electrophysiology TeleHealth Note   Due to national recommendations of social distancing due to COVID 19, an audio/video telehealth visit is felt to be most appropriate for this patient at this time.  See MyChart message from today for the patient's consent to telehealth for [facility].   Date:  07/09/2018   ID:  Wanda [name], DOB Jun 16, 1950, MRN [id number]  Location: patient's home  Provider location: [address], [place]  Evaluation Performed: Follow-up visit  PCP:  [place], [name], MD  Cardiologist:  Tonny Bollman, MD  Electrophysiologist:  Dr Johney Frame  Chief Complaint:  afib  History of Present Illness:    Wanda [name] is a 67 y.o. female who presents via audio/video conferencing for a telehealth visit today.  Since last being seen in our clinic, the patient reports doing very well. She continues to have occasional SOB.  Heart rates are better with her blood pressure cuff, though it does say "irregular" at times.  She has plans to purchase [place] but has not yet purchased this.  Today, she denies symptoms of palpitations, chest pain, lower extremity edema, dizziness, presyncope, or syncope.  The patient is otherwise without complaint today.  The patient denies symptoms of fevers, chills, cough, or new SOB worrisome for COVID 19.  Past Medical History:  Diagnosis Date  . Anxiety   . Arthritis 2018   hands have general aches  . CHF (congestive heart failure) (HCC) 2018   has shortness of breath since CHF diagnosis and has increased recently; states this is reason for admission  . Chronic systolic heart failure (HCC) 05/25/2016   tachycardia mediated,  resolved previously with sinus rhythm  . Depression   . Dyspnea 2018  . Migraine   . Nonischemic cardiomyopathy (HCC)    tachycardia mediated,  resolved previously with sinus rhythm  . Persistent atrial fibrillation 05/13/2016  . PVC's (premature ventricular contractions) 05/25/2016   Holter 11/17 The  basic rhythm is sinus. There are frequent PVCs, periods of ventricular bigeminy, and occasional ventricular couplets and triplets.  . S/P AVR (aortic valve replacement)     Past Surgical History:  Procedure Laterality Date  . AORTIC VALVE REPLACEMENT N/A 05/03/2016   Procedure: AORTIC VALVE REPLACEMENT (AVR) WITH MAGNA EASE PERICARDIAL BIOPROSTHESIS - AORTIC 21 MM MODEL 3300TFX;  Surgeon: [name], MD;  Location: [facility] OR;  Service: Open Heart Surgery;  Laterality: N/A;  . ATRIAL FIBRILLATION ABLATION N/A 06/28/2018   Procedure: ATRIAL FIBRILLATION ABLATION;  Surgeon: Hillis Range, MD;  Location: [facility] INVASIVE CV LAB;  Service: Cardiovascular;  Laterality: N/A;  . CARDIOVERSION N/A 02/16/2018   Procedure: CARDIOVERSION;  Surgeon: Lars Masson, MD;  Location: [facility] ENDOSCOPY;  Service: Cardiovascular;  Laterality: N/A;  . EYE SURGERY  2005   Lasik  . TEE WITHOUT CARDIOVERSION N/A 05/03/2016   Procedure: TRANSESOPHAGEAL ECHOCARDIOGRAM (TEE);  Surgeon: [name], MD;  Location: [facility] OR;  Service: Open Heart Surgery;  Laterality: N/A;  . TONSILLECTOMY AND ADENOIDECTOMY      Current Outpatient Medications  Medication Sig Dispense Refill  . acetaminophen (TYLENOL) 500 MG tablet Take 500-1,000 mg by mouth every 6 (six) hours as needed (for pain.).    Marland Kitchen apixaban (ELIQUIS) 5 MG TABS tablet Take 1 tablet (5 mg total) by mouth 2 (two) times daily. 60 tablet 11  . furosemide (LASIX) 20 MG tablet Take 20 mg by mouth as needed for fluid or edema.    Marland Kitchen LORazepam (ATIVAN) 0.5 MG tablet  Take 0.5 mg by mouth daily as needed for anxiety.     Marland Kitchen MELATONIN PO Take 1 tablet by mouth at bedtime as needed (sleep).    . pantoprazole (PROTONIX) 40 MG tablet Take 1 tablet (40 mg total) by mouth daily. 30 tablet 1  . spironolactone (ALDACTONE) 25 MG tablet Take 0.5 tablets (12.5 mg total) by mouth daily. 30 tablet 6   No current facility-administered medications for this visit.     Allergies:   Tikosyn  [dofetilide]; Codeine; and Vicodin [hydrocodone-acetaminophen]   Social History:  The patient  reports that she has quit smoking. She has never used smokeless tobacco. She reports current alcohol use. She reports that she does not use drugs.   Family History:  The patient's  family history includes Breast cancer (age of onset: 84) in her sister; Congestive Heart Failure in her maternal grandfather; Deep vein thrombosis in her brother, father, paternal aunt, and paternal grandmother; Diabetes in her mother; Heart disease in her brother; Lung cancer in her brother; Ovarian cancer (age of onset: 73) in her maternal grandmother.   ROS:  Please see the history of present illness.   All other systems are personally reviewed and negative.    Exam:    Vital Signs:  BP (!) 89/72   Pulse 64   LMP 08/12/2004   Well appearing, alert and conversant, regular work of breathing,  good skin color Eyes- anicteric, neuro- grossly intact, skin- no apparent rash or lesions or cyanosis, mouth- oral mucosa is pink   Labs/Other Tests and Data Reviewed:    Recent Labs: 06/26/2018: B Natriuretic Peptide 762.2; Hemoglobin 13.1; Platelets 224 06/29/2018: Magnesium 2.4 06/30/2018: BUN 23; Creatinine, Ser 1.19; Potassium 4.1; Sodium 139   Wt Readings from Last 3 Encounters:  06/30/18 153 lb 4.8 oz (69.5 kg)  03/05/18 151 lb (68.5 kg)  03/02/18 153 lb 12.8 oz (69.8 kg)     Other studies personally reviewed: Additional studies/ records that were reviewed today include: my prior notes,  Discharge summary Review of the above records today demonstrates: as above    ASSESSMENT & PLAN:    1.  Persistent afib We discussed options at length today We will plan to treat with amiodarone for the short term. Start amiodarone 200mg  BID x 4 weeks then 200mg  daily  2. Chronic systolic dysfunction Likely tachycardia mediated Hopefully will improve with sinus Remains too hypotensive for additional medical therapy Weight  is stable.  3. COVID 19 screen The patient denies symptoms of COVID 19 at this time.  The importance of social distancing was discussed today.  Follow-up:  AF clinic with telehealth visit in 1 week, I will do telehealth visit in 2 weeks  Current medicines are reviewed at length with the patient today.   The patient does not have concerns regarding her medicines.  The following changes were made today:  none  Labs/ tests ordered today include:  No orders of the defined types were placed in this encounter.   Patient Risk:  after full review of this patients clinical status, I feel that they are at highrisk at this time.  Today, I have spent 20 minutes with the patient with telehealth technology discussing afib management .    Randolm Idol, MD  07/09/2018 9:23 AM     [facility] [address] [place] [address] [phone] (office) [phone] (fax)

## 2018-07-10 ENCOUNTER — Telehealth (HOSPITAL_COMMUNITY): Payer: Self-pay | Admitting: *Deleted

## 2018-07-10 ENCOUNTER — Encounter (HOSPITAL_COMMUNITY): Payer: Self-pay

## 2018-07-10 NOTE — Telephone Encounter (Signed)
Called to schedule follow up appt per Dr. Johney Frame visit yesterday - talked with patient on phone for nearly 15 minutes - she has decided not to start amiodarone at this point. She was on amiodarone post OHS and was extremely sick on the medication, her brother died from lung issues she believes to be related to amiodarone and the potential side effects of the medication she would prefer to wait "see how things go" before being more willing to start amiodarone. Pt purchased Lourena Simmonds and should arrive today - she will take daily readings and will touch base with AF clinic next week. IF she is staying persistent AF she may reconsider short term amiodarone. Reiterated afib is to be expected after ablation but with reduced EF would prefer patient not stay in persistent AF. Pt will call me back if she changes her mind prior to appointment next week but at this point she will not start amiodarone.

## 2018-07-17 ENCOUNTER — Other Ambulatory Visit: Payer: Self-pay

## 2018-07-17 ENCOUNTER — Ambulatory Visit (HOSPITAL_COMMUNITY)
Admission: RE | Admit: 2018-07-17 | Discharge: 2018-07-17 | Disposition: A | Discharge status: Home / Self Care | Payer: Medicare Other | Source: Ambulatory Visit | Attending: Nurse Practitioner | Admitting: Nurse Practitioner

## 2018-07-17 ENCOUNTER — Encounter (HOSPITAL_COMMUNITY): Payer: Self-pay | Admitting: Nurse Practitioner

## 2018-07-17 VITALS — Ht 64.0 in

## 2018-07-17 DIAGNOSIS — I4819 Other persistent atrial fibrillation: Secondary | ICD-10-CM

## 2018-07-17 NOTE — Progress Notes (Signed)
Electrophysiology TeleHealth Note   Due to national recommendations of social distancing due to COVID 19, Audio/video telehealth visit is felt to be most appropriate for this patient at this time.  See MyChart message/consent below from today for patient consent regarding telehealth for the At[facility] Fibrillation Clinic.    Date:  07/17/2018   ID:  Wanda [name], DOB 10/24/1950, MRN [id number]  Location: home  Provider location: [address] [place], [place] [address] Evaluation Performed: Follow up  PCP:  SwazilandJordan, [name], MD  Primary Cardiologist:  Dr. Excell Seltzerooper Primary Electrophysiologist: Dr. Johney FrameAllred  XB:JYNWCC:afib   History of Present Illness: Wanda ColeMary J [name] is a 68 y.o. female who presents via video conferencing for a telehealth visit today.   The patient is referred for f/u ablation for afib referred by Dr. Johney FrameAllred.  Pt had an urgent  ablation 06/27/17, after an admission for acute on chronic HF with severely reduced EF. She was tried on [place]osyn but QTC prolonged and drug was stopped. Unfortunately, she has had ERAF and on a virtual visit with Dr. Johney FrameAllred 4/27, she was in afib with RVR and he suggested amiodarone short term to restore SR and improve  ejection fraction. She was afraid to take it and just started Saturday. She has had some times with HR's in the 60's since then and other times HR in the 120's +.  She has obtained an Alive Core and tried to send strips but to no avail. She will try again, would like the strips that say no afib unclassified when she has the rates in the 60's.She will take an occasional 12.5 metoprolol for faster HR's but has to be careful as she has soft BP's around 90 systolic. She feels tired and does not sleep well. Last night she slept some better. Feels short of breath which is unchanged since home form hospital. She is not weighting daily nor is she limiting fluids. She is limiting salt in  the diet. She will take a rare 20 mg lasix that was given to her in the fall.      Today, she denies symptoms of  chest pain,  orthopnea, PND, lower extremity edema, claudication, dizziness, presyncope, syncope, bleeding, or neurologic sequela.+ for chronic shortness of breath, fatigue.The patient is tolerating medications without difficulties. she denies symptoms of cough, fevers, chills, or new SOB worrisome for COVID 19.    Atrial Fibrillation Risk Factors:  she does not have symptoms or diagnosis of sleep apnea. she does not have a history of rheumatic fever. she does not have a history of alcohol use. The patient does not have a history of early familial atrial fibrillation or other arrhythmias.  she has a BMI of Body mass index is 26.31 kg/m.Marland Kitchen. There were no vitals filed for this visit.  Past Medical History:  Diagnosis Date  . Anxiety   . Arthritis 2018   hands have general aches  . CHF (congestive heart failure) (HCC) 2018   has shortness of breath since CHF diagnosis and has increased recently; states this is reason for admission  . Chronic systolic heart failure (HCC) 05/25/2016   tachycardia mediated,  resolved previously with sinus rhythm  . Depression   . Dyspnea 2018  . Migraine   . Nonischemic cardiomyopathy (HCC)    tachycardia mediated,  resolved previously with sinus rhythm  . Persistent atrial fibrillation 05/13/2016  . PVC's (premature ventricular contractions) 05/25/2016   Holter 11/17 The basic rhythm is sinus. There are frequent  PVCs, periods of ventricular bigeminy, and occasional ventricular couplets and triplets.  . S/P AVR (aortic valve replacement)    Past Surgical History:  Procedure Laterality Date  . AORTIC VALVE REPLACEMENT N/A 05/03/2016   Procedure: AORTIC VALVE REPLACEMENT (AVR) WITH MAGNA EASE PERICARDIAL BIOPROSTHESIS - AORTIC 21 MM MODEL 3300TFX;  Surgeon: [name], MD;  Location: [facility] OR;  Service: Open Heart Surgery;  Laterality: N/A;  . ATRIAL FIBRILLATION ABLATION N/A 06/28/2018   Procedure: ATRIAL  FIBRILLATION ABLATION;  Surgeon: Hillis Range, MD;  [facility] INVASIVE CV LAB;  Service: Cardiovascular;  Laterality: N/A;  . CARDIOVERSION N/A 02/16/2018   Procedure: CARDIOVERSION;  Surgeon: Lars Masson, MD;  Location: [facility] ENDOSCOPY;  Service: Cardiovascular;  Laterality: N/A;  . EYE SURGERY  2005   Lasik  . TEE WITHOUT CARDIOVERSION N/A 05/03/2016   Procedure: TRANSESOPHAGEAL ECHOCARDIOGRAM (TEE);  Surgeon: [name], MD;  Location: [facility] OR;  Service: Open Heart Surgery;  Laterality: N/A;  . TONSILLECTOMY AND ADENOIDECTOMY       Current Outpatient Medications  Medication Sig Dispense Refill  . acetaminophen (TYLENOL) 500 MG tablet Take 500-1,000 mg by mouth every 6 (six) hours as needed (for pain.).    Marland Kitchen amiodarone (PACERONE) 200 MG tablet Take 1 tablet (200 mg total) by mouth daily. 90 tablet 3  . apixaban (ELIQUIS) 5 MG TABS tablet Take 1 tablet (5 mg total) by mouth 2 (two) times daily. 60 tablet 11  . furosemide (LASIX) 20 MG tablet Take 20 mg by mouth as needed for fluid or edema.    Marland Kitchen LORazepam (ATIVAN) 0.5 MG tablet Take 0.5 mg by mouth daily as needed for anxiety.     Marland Kitchen MELATONIN PO Take 1 tablet by mouth at bedtime as needed (sleep).    . pantoprazole (PROTONIX) 40 MG tablet Take 1 tablet (40 mg total) by mouth daily. 30 tablet 1  . spironolactone (ALDACTONE) 25 MG tablet Take 0.5 tablets (12.5 mg total) by mouth daily. 30 tablet 6   No current facility-administered medications for this encounter.     Allergies:   Tikosyn [dofetilide]; Codeine; and Vicodin [hydrocodone-acetaminophen]   Social History:  The patient  reports that she has quit smoking. She has never used smokeless tobacco. She reports current alcohol use. She reports that she does not use drugs.   Family History:  The patient's  family history includes Breast cancer (age of onset: 25) in her sister; Congestive Heart Failure in her maternal grandfather; Deep vein thrombosis in her brother, father,  paternal aunt, and paternal grandmother; Diabetes in her mother; Heart disease in her brother; Lung cancer in her brother; Ovarian cancer (age of onset: 35) in her maternal grandmother.    ROS:  Please see the history of present illness.   All other systems are personally reviewed and negative.   Exam: Tired in appearance, alert and conversant, regular work of breathing,  good skin color  Recent Labs: 06/26/2018: B Natriuretic Peptide 762.2; Hemoglobin 13.1; Platelets 224 06/29/2018: Magnesium 2.4 06/30/2018: BUN 23; Creatinine, Ser 1.19; Potassium 4.1; Sodium 139  personally reviewed    Other studies personally reviewed: All recent Epic documents/notes/ labs reviewed     ASSESSMENT AND PLAN:  1.  ERAF after urgent ablation Started   amiodarone 200 mg bid since Saturday  and by her description is having some spells of SR as well as continued times with Afib with RVR She is taking a rare 12.5 mg metoprolol tartrate for RVR but has to  be  careful with soft BP's Continue Eliquis 5 mg bid  This patients CHA2DS2-VASc Score and unadjusted Ischemic Stroke Rate (% per year) is equal to 3.2 % stroke rate/year from a score of 3  Above score calculated as 1 point each if present [CHF, HTN, DM, Vascular=MI/PAD/Aortic Plaque, Age if 65-74, or Female] Above score calculated as 2 points each if present [Age > 75, or Stroke/TIA/TE]  2. Chronic  combined systolic and diastolic HF Pt is not weighting daily and encouraged to do so  She will take an occasional 20 mg lasix if she feels the need Not letting weight guide her Warned to be careful with this 2/2 soft BP's  She is avoiding salt She is drinking a lot of water and encouraged to try to limit to a 2L bottle of total fluid intake If she feels her breathing is getting worse with very low BP's , would be an indication to go to ER  COVID screen The patient does not have any symptoms that suggest any further testing/ screening at this time.  Social  distancing reinforced today.   Follow-up: 5/11 with Dr. Johney Frame Instructed to have a very low bar to call for advice  to the afib clinic number  She will try to resend strips  Current medicines are reviewed at length with the patient today.   The patient does not have concerns regarding her medicines.  The following changes were made today:  none  Labs/ tests ordered today include: none No orders of the defined types were placed in this encounter.   Patient Risk:  after full review of this patients clinical status, I feel that they are at high risk at this time.   Today, I have spent 40 minutes with the patient with telehealth technology discussing afib and management  Signed, Rudi Coco NP 07/17/2018 4:08 PM  Afib Clinic [facility] [address] [place], [place] [address] [phone]   I hereby voluntarily request, consent and authorize the Atrial Fibrillation Clinic and its employed or contracted physicians, physician assistants, nurse practitioners or other licensed health care professionals (the Practitioner), to provide me with telemedicine health care services (the "Services") as deemed necessary by the treating Practitioner. I acknowledge and consent to receive the Services by the Practitioner via telemedicine. I understand that the telemedicine visit will involve communicating with the Practitioner through live audiovisual communication technology and the disclosure of certain medical information by electronic transmission. I acknowledge that I have been given the opportunity to request an in-person assessment or other available alternative prior to the telemedicine visit and am voluntarily participating in the telemedicine visit.   I understand that I have the right to withhold or withdraw my consent to the use of telemedicine in the course of my care at any time, without affecting my right to future care or treatment, and that the Practitioner or I may terminate  the telemedicine visit at any time. I understand that I have the right to inspect all information obtained and/or recorded in the course of the telemedicine visit and may receive copies of available information for a reasonable fee.  I understand that some of the potential risks of receiving the Services via telemedicine include:   Delay or interruption in medical evaluation due to technological equipment failure or disruption;  Information transmitted may not be sufficient (e.g. poor resolution of images) to allow for appropriate medical decision making by the Practitioner; and/or  In rare instances, security protocols could fail, causing  a breach of personal health information.   Furthermore, I acknowledge that it is my responsibility to provide information about my medical history, conditions and care that is complete and accurate to the best of my ability. I acknowledge that Practitioner's advice, recommendations, and/or decision may be based on factors not within their control, such as incomplete or inaccurate data provided by me or distortions of diagnostic images or specimens that may result from electronic transmissions. I understand that the practice of medicine is not an exact science and that Practitioner makes no warranties or guarantees regarding treatment outcomes. I acknowledge that I will receive a copy of this consent concurrently upon execution via email to the email address I last provided but may also request a printed copy by calling the office of the Atrial Fibrillation Clinic.  I understand that my insurance will be billed for this visit.   I have read or had this consent read to me.  I understand the contents of this consent, which adequately explains the benefits and risks of the Services being provided via telemedicine.  I have been provided ample opportunity to ask questions regarding this consent and the Services and have had my questions answered to my satisfaction.  I give  my informed consent for the services to be provided through the use of telemedicine in my medical care  By participating in this telemedicine visit I agree to the above.

## 2018-07-19 ENCOUNTER — Other Ambulatory Visit: Payer: Self-pay

## 2018-07-19 ENCOUNTER — Ambulatory Visit (HOSPITAL_COMMUNITY)
Admission: RE | Admit: 2018-07-19 | Discharge: 2018-07-19 | Disposition: A | Discharge status: Home / Self Care | Payer: Medicare Other | Source: Ambulatory Visit | Attending: Physician Assistant | Admitting: Physician Assistant

## 2018-07-19 ENCOUNTER — Encounter (HOSPITAL_COMMUNITY): Payer: Self-pay | Admitting: Physician Assistant

## 2018-07-19 ENCOUNTER — Telehealth (HOSPITAL_COMMUNITY): Payer: Self-pay | Admitting: *Deleted

## 2018-07-19 VITALS — BP 96/58 | HR 125 | Ht 64.0 in | Wt 149.0 lb

## 2018-07-19 DIAGNOSIS — Z8249 Family history of ischemic heart disease and other diseases of the circulatory system: Secondary | ICD-10-CM | POA: Insufficient documentation

## 2018-07-19 DIAGNOSIS — Z833 Family history of diabetes mellitus: Secondary | ICD-10-CM | POA: Insufficient documentation

## 2018-07-19 DIAGNOSIS — I480 Paroxysmal atrial fibrillation: Secondary | ICD-10-CM | POA: Insufficient documentation

## 2018-07-19 DIAGNOSIS — Z87891 Personal history of nicotine dependence: Secondary | ICD-10-CM | POA: Insufficient documentation

## 2018-07-19 DIAGNOSIS — I5042 Chronic combined systolic (congestive) and diastolic (congestive) heart failure: Secondary | ICD-10-CM | POA: Insufficient documentation

## 2018-07-19 DIAGNOSIS — Z7901 Long term (current) use of anticoagulants: Secondary | ICD-10-CM | POA: Insufficient documentation

## 2018-07-19 DIAGNOSIS — Z953 Presence of xenogenic heart valve: Secondary | ICD-10-CM | POA: Insufficient documentation

## 2018-07-19 DIAGNOSIS — F419 Anxiety disorder, unspecified: Secondary | ICD-10-CM | POA: Diagnosis not present

## 2018-07-19 DIAGNOSIS — Z803 Family history of malignant neoplasm of breast: Secondary | ICD-10-CM | POA: Insufficient documentation

## 2018-07-19 DIAGNOSIS — M1990 Unspecified osteoarthritis, unspecified site: Secondary | ICD-10-CM | POA: Diagnosis not present

## 2018-07-19 DIAGNOSIS — Z885 Allergy status to narcotic agent status: Secondary | ICD-10-CM | POA: Diagnosis not present

## 2018-07-19 DIAGNOSIS — Z79899 Other long term (current) drug therapy: Secondary | ICD-10-CM | POA: Insufficient documentation

## 2018-07-19 DIAGNOSIS — I4819 Other persistent atrial fibrillation: Secondary | ICD-10-CM | POA: Diagnosis not present

## 2018-07-19 DIAGNOSIS — Z888 Allergy status to other drugs, medicaments and biological substances status: Secondary | ICD-10-CM | POA: Diagnosis not present

## 2018-07-19 NOTE — Telephone Encounter (Signed)
Patient called in stating she is having increased shortness of breath and HR in the 100-130 range. She has not seen any HRs less than 100 since yesterday morning. She is audibly short of breath with conversation. She denies weight gain. She is stressed in regards to her 66 year old dog having to be put down this morning. Per Rudi Coco NP in office assessment warranted and pt in agreement.

## 2018-07-20 ENCOUNTER — Other Ambulatory Visit (HOSPITAL_COMMUNITY): Payer: Self-pay | Admitting: *Deleted

## 2018-07-20 ENCOUNTER — Telehealth: Payer: Self-pay

## 2018-07-20 MED ORDER — AMIODARONE HCL 200 MG PO TABS: 400.0000 mg | ORAL_TABLET | Freq: Two times a day (BID) | ORAL | Status: DC

## 2018-07-20 NOTE — Progress Notes (Signed)
Primary Care Physician: [place], [name], MD Referring Physician: Dr. Excell Seltzer Primary EP: Dr Danielle Rankin Wanda [name] is a 68 y.o. female with a h/o  aortic insufficiency, combined systolic and diastolic CHF, non-ischemic dilated CM.She underwent bioprosthetic AVR by Dr. Tyrone Sage in February 2018.  Postoperative course was complicated by atrial fibrillation with a rapid rate requiring IV amiodarone.  Echo in June 2019 demonstrated improved LV function with an EF of 55-60% and mild diastolic   She was seen by Dr. Excell Seltzer 02/01/18 after an admission to the hospital in [facility] with atrial fibrillation with rapid rate complicated by congestive heart failure.  She was started on Apixaban for anticoagulation and diltiazem and metoprolol for rate control.  Echo done in the hospital in [place], [place] demonstrated an EF of 45-50 and a normally functioning aortic valve prosthesis.  Elective cardioversion was arranged after 3 weeks of uninterrupted anticoagulation.  She underwent cardioversion on 02/16/2018 with restoration of normal sinus rhythm (after 4 attempts-120 J, 150 J, 200 J x 2).  She had f/u with Tereso Newcomer, PA on 12/20. She felt better for a few days but went back into afib with feeling more fatigued and short of breath.  She is now in the afib clinic, 12/23, to discuss options to restore SR. She does not feel terrible in afib but better in SR. She is dealing with a lot of stress with substance abuse in her son and her concerns for his health. He is currently living with the pt and her husband and going thru detox. She feels this is what made her go back in  afib so quickly. [name] thought that Tikosyn may be a good option but pt will have to check on cost of drug.  F/u in AF clinic 07/20/18. Patient is s/p AF ablation with Dr Johney Frame 06/28/18. Recall she failed Tikosyn 2/2 QT prolongation. She feels more heart racing over the last few days with increased SOB and fatigue. She reports that she  becomes SOB even just walking from one room to the next. She admits that she has significant life stressors right now with ill family members and the passing of the family dog. She brings in her Kardia strips which show paroxysms of afib with RVR and also SR. She does have some abdominal fullness and orthopnea which improved with PRN Lasix. No swallowing or groin issues.  Today, she denies symptoms of chest pain, PND, lower extremity edema, dizziness, presyncope, syncope, or neurologic sequela. The patient is tolerating medications without difficulties and is otherwise without complaint today.   Past Medical History:  Diagnosis Date  . Anxiety   . Arthritis 2018   hands have general aches  . CHF (congestive heart failure) (HCC) 2018   has shortness of breath since CHF diagnosis and has increased recently; states this is reason for admission  . Chronic systolic heart failure (HCC) 05/25/2016   tachycardia mediated,  resolved previously with sinus rhythm  . Depression   . Dyspnea 2018  . Migraine   . Nonischemic cardiomyopathy (HCC)    tachycardia mediated,  resolved previously with sinus rhythm  . Persistent atrial fibrillation 05/13/2016  . PVC's (premature ventricular contractions) 05/25/2016   Holter 11/17 The basic rhythm is sinus. There are frequent PVCs, periods of ventricular bigeminy, and occasional ventricular couplets and triplets.  . S/P AVR (aortic valve replacement)    Past Surgical History:  Procedure Laterality Date  . AORTIC VALVE REPLACEMENT N/A 05/03/2016   Procedure:  AORTIC VALVE REPLACEMENT (AVR) WITH MAGNA EASE PERICARDIAL BIOPROSTHESIS - AORTIC 21 MM MODEL 3300TFX;  Surgeon: [name], MD;  Location: [facility] OR;  Service: Open Heart Surgery;  Laterality: N/A;  . ATRIAL FIBRILLATION ABLATION N/A 06/28/2018   Procedure: ATRIAL FIBRILLATION ABLATION;  Surgeon: Hillis Range, MD;  Location: [facility] INVASIVE CV LAB;  Service: Cardiovascular;  Laterality: N/A;  . CARDIOVERSION  N/A 02/16/2018   Procedure: CARDIOVERSION;  Surgeon: Lars Masson, MD;  Location: [facility] ENDOSCOPY;  Service: Cardiovascular;  Laterality: N/A;  . EYE SURGERY  2005   Lasik  . TEE WITHOUT CARDIOVERSION N/A 05/03/2016   Procedure: TRANSESOPHAGEAL ECHOCARDIOGRAM (TEE);  Surgeon: [name], MD;  Location: [facility] OR;  Service: Open Heart Surgery;  Laterality: N/A;  . TONSILLECTOMY AND ADENOIDECTOMY      Current Outpatient Medications  Medication Sig Dispense Refill  . acetaminophen (TYLENOL) 500 MG tablet Take 500-1,000 mg by mouth every 6 (six) hours as needed (for pain.).    Marland Kitchen apixaban (ELIQUIS) 5 MG TABS tablet Take 1 tablet (5 mg total) by mouth 2 (two) times daily. 60 tablet 11  . furosemide (LASIX) 20 MG tablet Take 20 mg by mouth as needed for fluid or edema.    Marland Kitchen LORazepam (ATIVAN) 0.5 MG tablet Take 0.5 mg by mouth daily as needed for anxiety.     Marland Kitchen MELATONIN PO Take 1 tablet by mouth at bedtime as needed (sleep).    . pantoprazole (PROTONIX) 40 MG tablet Take 1 tablet (40 mg total) by mouth daily. 30 tablet 1  . spironolactone (ALDACTONE) 25 MG tablet Take 0.5 tablets (12.5 mg total) by mouth daily. 30 tablet 6  . amiodarone (PACERONE) 200 MG tablet Take 2 tablets (400 mg total) by mouth 2 (two) times daily.     No current facility-administered medications for this encounter.     Allergies  Allergen Reactions  . Tikosyn [Dofetilide] Other (See Comments)    Prolonged QT, nonsustained torsades at low dose  . Codeine Nausea And Vomiting  . Vicodin [Hydrocodone-Acetaminophen] Nausea And Vomiting    Social History   Socioeconomic History  . Marital status: Married    Spouse name: Not on file  . Number of children: Not on file  . Years of education: Not on file  . Highest education level: Not on file  Occupational History  . Not on file  Social Needs  . Financial resource strain: Not on file  . Food insecurity:    Worry: Not on file    Inability: Not on file  .  Transportation needs:    Medical: Not on file    Non-medical: Not on file  Tobacco Use  . Smoking status: Former Games developer  . Smokeless tobacco: Never Used  . Tobacco comment: stopped in 70's while in college; smoked 1-2 years  Substance and Sexual Activity  . Alcohol use: Yes    Comment: 1-2 glasses of wine; hasn't had wine for last month  . Drug use: No  . Sexual activity: Yes    Partners: Male    Birth control/protection: Other-see comments, Post-menopausal    Comment: vasectomy  Lifestyle  . Physical activity:    Days per week: Not on file    Minutes per session: Not on file  . Stress: Not on file  Relationships  . Social connections:    Talks on phone: Not on file    Gets together: Not on file    Attends religious service: Not on file  Active member of club or organization: Not on file    Attends meetings of clubs or organizations: Not on file    Relationship status: Not on file  . Intimate partner violence:    Fear of current or ex partner: Not on file    Emotionally abused: Not on file    Physically abused: Not on file    Forced sexual activity: Not on file  Other Topics Concern  . Not on file  Social History Narrative   Lives in [place]   retired    Family History  Problem Relation Age of Onset  . Diabetes Mother   . Ovarian cancer Maternal Grandmother 80  . Breast cancer Sister 77  . Congestive Heart Failure Maternal Grandfather   . Heart disease Brother        valve replacement  . Deep vein thrombosis Brother   . Lung cancer Brother   . Deep vein thrombosis Father   . Deep vein thrombosis Paternal Aunt        x2  . Deep vein thrombosis Paternal Grandmother     ROS- All systems are reviewed and negative except as per the HPI above  Physical Exam: Vitals:   07/19/18 1410  BP: (!) 96/58  Pulse: (!) 125  Weight: 67.6 kg  Height: 5\' 4"  (1.626 m)   Wt Readings from Last 3 Encounters:  07/19/18 67.6 kg  06/30/18 69.5 kg  03/05/18 68.5 kg     Labs: Lab Results  Component Value Date   NA 139 06/30/2018   K 4.1 06/30/2018   CL 106 06/30/2018   CO2 24 06/30/2018   GLUCOSE 100 (H) 06/30/2018   BUN 23 06/30/2018   CREATININE 1.19 (H) 06/30/2018   CALCIUM 8.7 (L) 06/30/2018   MG 2.4 06/29/2018   Lab Results  Component Value Date   INR 1.8 07/07/2016   Lab Results  Component Value Date   CHOL 247 (H) 08/18/2015   HDL 108 08/18/2015   LDLCALC 124 08/18/2015   TRIG 73 08/18/2015    GEN- The patient is well appearing, alert and oriented x 3 today.   HEENT-head normocephalic, atraumatic, sclera clear, conjunctiva pink, hearing intact, trachea midline. Lungs- Clear to ausculation bilaterally, mildly labored work of breathing Heart- Regular rhythm, tachycardia, no murmurs, rubs or gallops  GI- soft, NT, ND, + BS Extremities- no clubbing, cyanosis, or edema MS- no significant deformity or atrophy Skin- no rash or lesion Psych- euthymic mood, full affect Neuro- strength and sensation are intact   EKG- WCT, atypical atrial flutter vs SVT, LAFB, nonspecific intraventricular block. HR 125, QRS 130, QTc 574  Other studies personally reviewed: All recent Epic documents/notes/ labs reviewed   Echo 06/26/18 1. The left ventricle has severely reduced systolic function, with an ejection fraction of 20-25%. The cavity size was normal. There is mildly increased left ventricular wall thickness. Left ventricular diastolic function could not be evaluated  secondary to atrial fibrillation. Left ventricular diffuse hypokinesis.  2. Left atrial size was moderately dilated.  3. Right atrial size was moderately dilated.  4. The mitral valve is grossly normal. Mild thickening of the mitral valve leaflet. Mitral valve regurgitation is mild to moderate by color flow Doppler.  5. The tricuspid valve is grossly normal.  6. Aortic valve regurgitation is trivial by color flow Doppler.  7. Severe global reduction in LV systolic function; mild  LVH; moderate biatrial elargement; s/p AVR with trace AI; mild to moderate MR.  Assessment and Plan: 1.  Paroxysmal atrial fibrillation S/p ablation with Dr Johney FrameAllred 06/28/18 with ERAF. Started on amiodarone.  Lourena SimmondsKardia confirms paroxysms of afib with periods of SR. Would not advise DCCV. After discussion with Dr Johney FrameAllred, will increase amiodarone to 400 mg BID. Continue Eliquis 5 mg BID  This patients CHA2DS2-VASc Score and unadjusted Ischemic Stroke Rate (% per year) is equal to 3.2 % stroke rate/year from a score of 3  Above score calculated as 1 point each if present [CHF, HTN, DM, Vascular=MI/PAD/Aortic Plaque, Age if 65-74, or Female] Above score calculated as 2 points each if present [Age > 75, or Stroke/TIA/TE]  2. Chronic combined systolic and diastolic CHF EF 16-10%20-25%. Likely related to AF. Weight stable. No overt signs of fluid overload. Continue spironolactone 25 mg daily.  Limited on therapy titration with hypotension. May take Lasix 20 mg PRN 2 gram sodium diet.   Follow up with Dr Johney FrameAllred as scheduled.    Jorja Loaicky Kelseigh Diver PA-C [facility] [address] [place], [place] [address] [phone]

## 2018-07-20 NOTE — Telephone Encounter (Signed)
Spoke with pt regarding appt on 07/23/18. Pt was advise to check vitals prior to appt. Pt questions and concerns were address. 

## 2018-07-23 ENCOUNTER — Telehealth: Payer: Self-pay

## 2018-07-23 ENCOUNTER — Telehealth (INDEPENDENT_AMBULATORY_CARE_PROVIDER_SITE_OTHER): Payer: Medicare Other | Admitting: Internal Medicine

## 2018-07-23 DIAGNOSIS — I4819 Other persistent atrial fibrillation: Secondary | ICD-10-CM

## 2018-07-23 DIAGNOSIS — I5023 Acute on chronic systolic (congestive) heart failure: Secondary | ICD-10-CM

## 2018-07-23 DIAGNOSIS — I428 Other cardiomyopathies: Secondary | ICD-10-CM | POA: Diagnosis not present

## 2018-07-23 NOTE — Progress Notes (Signed)
Electrophysiology TeleHealth Note   Due to national recommendations of social distancing due to COVID 19, an audio/video telehealth visit is felt to be most appropriate for this patient at this time.  See MyChart message from today for the patient's consent to telehealth for [facility].   Date:  07/23/2018   ID:  Wanda [name], DOB Dec 30, 1950, MRN [id number]  Location: patient's home  Provider location: [address] [place]  Evaluation Performed: Follow-up visit  PCP:  [place], [name], MD  Cardiologist:  Tonny Bollman, MD  Electrophysiologist:  Dr Johney Frame  Chief Complaint:  SOB  History of Present Illness:    Wanda [name] is a 68 y.o. female who presents via audio/video conferencing for a telehealth visit today.  She continues to struggle.  She is having frequent afib with RVR.  She also continues to have SOB.  + orthopnea.  Today, she denies symptoms of  chest pain,  lower extremity edema, dizziness, presyncope, or syncope.  The patient is otherwise without complaint today.  The patient denies symptoms of fevers, chills, cough, or new SOB worrisome for COVID 19.  Past Medical History:  Diagnosis Date  . Anxiety   . Arthritis 2018   hands have general aches  . CHF (congestive heart failure) (HCC) 2018   has shortness of breath since CHF diagnosis and has increased recently; states this is reason for admission  . Chronic systolic heart failure (HCC) 05/25/2016   tachycardia mediated,  resolved previously with sinus rhythm  . Depression   . Dyspnea 2018  . Migraine   . Nonischemic cardiomyopathy (HCC)    tachycardia mediated,  resolved previously with sinus rhythm  . Persistent atrial fibrillation 05/13/2016  . PVC's (premature ventricular contractions) 05/25/2016   Holter 11/17 The basic rhythm is sinus. There are frequent PVCs, periods of ventricular bigeminy, and occasional ventricular couplets and triplets.  . S/P AVR (aortic valve replacement)      Past Surgical History:  Procedure Laterality Date  . AORTIC VALVE REPLACEMENT N/A 05/03/2016   Procedure: AORTIC VALVE REPLACEMENT (AVR) WITH MAGNA EASE PERICARDIAL BIOPROSTHESIS - AORTIC 21 MM MODEL 3300TFX;  Surgeon: [name], MD;  Location: [facility] OR;  Service: Open Heart Surgery;  Laterality: N/A;  . ATRIAL FIBRILLATION ABLATION N/A 06/28/2018   Procedure: ATRIAL FIBRILLATION ABLATION;  Surgeon: Hillis Range, MD;  [facility] INVASIVE CV LAB;  Service: Cardiovascular;  Laterality: N/A;  . CARDIOVERSION N/A 02/16/2018   Procedure: CARDIOVERSION;  Surgeon: Lars Masson, MD;  Location: [facility] ENDOSCOPY;  Service: Cardiovascular;  Laterality: N/A;  . EYE SURGERY  2005   Lasik  . TEE WITHOUT CARDIOVERSION N/A 05/03/2016   Procedure: TRANSESOPHAGEAL ECHOCARDIOGRAM (TEE);  Surgeon: [name], MD;  Location: [facility] OR;  Service: Open Heart Surgery;  Laterality: N/A;  . TONSILLECTOMY AND ADENOIDECTOMY      Current Outpatient Medications  Medication Sig Dispense Refill  . acetaminophen (TYLENOL) 500 MG tablet Take 500-1,000 mg by mouth every 6 (six) hours as needed (for pain.).    Marland Kitchen amiodarone (PACERONE) 200 MG tablet Take 2 tablets (400 mg total) by mouth 2 (two) times daily.    Marland Kitchen apixaban (ELIQUIS) 5 MG TABS tablet Take 1 tablet (5 mg total) by mouth 2 (two) times daily. 60 tablet 11  . furosemide (LASIX) 20 MG tablet Take 20 mg by mouth as needed for fluid or edema.    Marland Kitchen LORazepam (ATIVAN) 0.5 MG tablet Take 0.5 mg by mouth  daily as needed for anxiety.     Marland Kitchen MELATONIN PO Take 1 tablet by mouth at bedtime as needed (sleep).    . pantoprazole (PROTONIX) 40 MG tablet Take 1 tablet (40 mg total) by mouth daily. 30 tablet 1  . spironolactone (ALDACTONE) 25 MG tablet Take 0.5 tablets (12.5 mg total) by mouth daily. 30 tablet 6   No current facility-administered medications for this visit.     Allergies:   Tikosyn [dofetilide]; Codeine; and Vicodin [hydrocodone-acetaminophen]   Social  History:  The patient  reports that she has quit smoking. She has never used smokeless tobacco. She reports current alcohol use. She reports that she does not use drugs.   Family History:  The patient's  family history includes Breast cancer (age of onset: 29) in her sister; Congestive Heart Failure in her maternal grandfather; Deep vein thrombosis in her brother, father, paternal aunt, and paternal grandmother; Diabetes in her mother; Heart disease in her brother; Lung cancer in her brother; Ovarian cancer (age of onset: 69) in her maternal grandmother.   ROS:  Please see the history of present illness.   All other systems are personally reviewed and negative.    Exam:    Vital Signs:  LMP 08/12/2004   Well appearing, alert and conversant, regular work of breathing,  good skin color Eyes- anicteric, neuro- grossly intact, skin- no apparent rash or lesions or cyanosis, mouth- oral mucosa is pink   Labs/Other Tests and Data Reviewed:    Recent Labs: 06/26/2018: B Natriuretic Peptide 762.2; Hemoglobin 13.1; Platelets 224 06/29/2018: Magnesium 2.4 06/30/2018: BUN 23; Creatinine, Ser 1.19; Potassium 4.1; Sodium 139   Wt Readings from Last 3 Encounters:  07/19/18 149 lb (67.6 kg)  06/30/18 153 lb 4.8 oz (69.5 kg)  03/05/18 151 lb (68.5 kg)     Other studies personally reviewed: Additional studies/ records that were reviewed today include: AF clinic notes, my prior notes  Review of the above records today demonstrates: as above   ASSESSMENT & PLAN:    1.  Persistent afib Continues to be a struggle She is currently on amiodarone 400mg  BID Reports compliance with eliquis I will schedule in office visit with me later this week  2. Acute on chronic systolic dysfunction + SOB Increase lasix to 40mg  qam and 20mg  qpm Assess in office this week Will need labs when there  3. COVID 19 screen The patient denies symptoms of COVID 19 at this time.  The importance of social distancing was  discussed today.  Follow-up:  I will see in office on Wednesday  Patient Risk:  after full review of this patients clinical status, I feel that they are at high risk at this time.  Today, I have spent 20 minutes with the patient with telehealth technology discussing decompensated CHF and afib.   She is at risk for further decompensation, hospitalization and death.  I have spoken with AF clinic about her recently.  A high level of decision making was required for this encounter.   Randolm Idol, MD  07/23/2018 9:32 AM     [facility] [address] [place] [address] [phone] (office) [phone] (fax)

## 2018-07-23 NOTE — Telephone Encounter (Signed)
Advised appt made for in office visit with Dr. Johney Frame on 07/25/2018 at 9:00 am

## 2018-07-25 ENCOUNTER — Other Ambulatory Visit: Payer: Self-pay

## 2018-07-25 ENCOUNTER — Encounter: Payer: Self-pay | Admitting: Internal Medicine

## 2018-07-25 ENCOUNTER — Ambulatory Visit (INDEPENDENT_AMBULATORY_CARE_PROVIDER_SITE_OTHER): Payer: Medicare Other | Admitting: Internal Medicine

## 2018-07-25 VITALS — BP 132/76 | HR 116 | Ht 64.0 in | Wt 148.4 lb

## 2018-07-25 DIAGNOSIS — I5023 Acute on chronic systolic (congestive) heart failure: Secondary | ICD-10-CM

## 2018-07-25 DIAGNOSIS — I4819 Other persistent atrial fibrillation: Secondary | ICD-10-CM | POA: Diagnosis not present

## 2018-07-25 LAB — BASIC METABOLIC PANEL
BUN/Creatinine Ratio: 19 (ref 12–28)
BUN: 30 mg/dL — ABNORMAL HIGH (ref 8–27)
CO2: 20 mmol/L (ref 20–29)
Calcium: 9.6 mg/dL (ref 8.7–10.3)
Chloride: 101 mmol/L (ref 96–106)
Creatinine, Ser: 1.56 mg/dL — ABNORMAL HIGH (ref 0.57–1.00)
GFR calc Af Amer: 40 mL/min/{1.73_m2} — ABNORMAL LOW (ref >59)
GFR calc non Af Amer: 34 mL/min/{1.73_m2} — ABNORMAL LOW (ref >59)
Glucose: 106 mg/dL — ABNORMAL HIGH (ref 65–99)
Potassium: 4 mmol/L (ref 3.5–5.2)
Sodium: 141 mmol/L (ref 134–144)

## 2018-07-25 LAB — TSH: TSH: 2.06 u[IU]/mL (ref 0.450–4.500)

## 2018-07-25 LAB — T4, FREE: Free T4: 1.41 ng/dL (ref 0.82–1.77)

## 2018-07-25 LAB — MAGNESIUM: Magnesium: 2.1 mg/dL (ref 1.6–2.3)

## 2018-07-25 MED ORDER — FUROSEMIDE 20 MG PO TABS: ORAL_TABLET | ORAL | 3 refills | Status: DC

## 2018-07-25 MED ORDER — SPIRONOLACTONE 25 MG PO TABS: 25.0000 mg | ORAL_TABLET | Freq: Every day | ORAL | 3 refills | Status: DC

## 2018-07-25 NOTE — Patient Instructions (Addendum)
Medication Instructions:  Your physician has recommended you make the following change in your medication:   1.  Lasix 20 mg-- Take one tablet by mouth twice a day for 3 days.  After 3 days reduce your lasix to one tablet by mouth daily.  2.  Increase your spironolactone 25 mg--Take one tablet by mouth daily at night.  3.  You may take colace 100 mg-  One capsule by mouth daily as needed   Labwork: You will get lab work today:  BMP, mag, TSH and free T4  Testing/Procedures: None ordered.  Follow-Up: Your physician wants you to follow-up in: next week Jul 30, 2018 at 9:00 am with Andy/Dr. Johney Frame.  Any Other Special Instructions Will Be Listed Below (If Applicable).  If you need a refill on your cardiac medications before your next appointment, please call your pharmacy.

## 2018-07-25 NOTE — Progress Notes (Signed)
PCP: [place], [name], MD Primary Cardiologist: Dr Excell Seltzer Primary EP: Dr Danielle Rankin Wanda [name] is a 68 y.o. female who presents today for urgent cardiology followup.  She continues to struggle with atrial arrhythmias and heart failure.  Since increasing her lasix, her abdominal fullness and edema is better.  Her SOB is also a little better. Today, she denies symptoms of palpitations, chest pain,  dizziness, presyncope, or syncope.  The patient is otherwise without complaint today.   Past Medical History:  Diagnosis Date  . Anxiety   . Arthritis 2018   hands have general aches  . CHF (congestive heart failure) (HCC) 2018   has shortness of breath since CHF diagnosis and has increased recently; states this is reason for admission  . Chronic systolic heart failure (HCC) 05/25/2016   tachycardia mediated,  resolved previously with sinus rhythm  . Depression   . Dyspnea 2018  . Migraine   . Nonischemic cardiomyopathy (HCC)    tachycardia mediated,  resolved previously with sinus rhythm  . Persistent atrial fibrillation 05/13/2016  . PVC's (premature ventricular contractions) 05/25/2016   Holter 11/17 The basic rhythm is sinus. There are frequent PVCs, periods of ventricular bigeminy, and occasional ventricular couplets and triplets.  . S/P AVR (aortic valve replacement)    Past Surgical History:  Procedure Laterality Date  . AORTIC VALVE REPLACEMENT N/A 05/03/2016   Procedure: AORTIC VALVE REPLACEMENT (AVR) WITH MAGNA EASE PERICARDIAL BIOPROSTHESIS - AORTIC 21 MM MODEL 3300TFX;  Surgeon: [name], MD;  Location: [facility] OR;  Service: Open Heart Surgery;  Laterality: N/A;  . ATRIAL FIBRILLATION ABLATION N/A 06/28/2018   Procedure: ATRIAL FIBRILLATION ABLATION;  Surgeon: Hillis Range, MD;  [facility] INVASIVE CV LAB;  Service: Cardiovascular;  Laterality: N/A;  . CARDIOVERSION N/A 02/16/2018   Procedure: CARDIOVERSION;  Surgeon: Lars Masson, MD;  Location: [facility] ENDOSCOPY;  Service:  Cardiovascular;  Laterality: N/A;  . EYE SURGERY  2005   Lasik  . TEE WITHOUT CARDIOVERSION N/A 05/03/2016   Procedure: TRANSESOPHAGEAL ECHOCARDIOGRAM (TEE);  Surgeon: [name], MD;  Location: [facility] OR;  Service: Open Heart Surgery;  Laterality: N/A;  . TONSILLECTOMY AND ADENOIDECTOMY      ROS- all systems are reviewed and negatives except as per HPI above  Current Outpatient Medications  Medication Sig Dispense Refill  . acetaminophen (TYLENOL) 500 MG tablet Take 500-1,000 mg by mouth every 6 (six) hours as needed (for pain.).    Marland Kitchen amiodarone (PACERONE) 200 MG tablet Take 2 tablets (400 mg total) by mouth 2 (two) times daily.    Marland Kitchen apixaban (ELIQUIS) 5 MG TABS tablet Take 1 tablet (5 mg total) by mouth 2 (two) times daily. 60 tablet 11  . LORazepam (ATIVAN) 0.5 MG tablet Take 0.5 mg by mouth daily as needed for anxiety.     Marland Kitchen MELATONIN PO Take 1 tablet by mouth at bedtime as needed (sleep).    . pantoprazole (PROTONIX) 40 MG tablet Take 1 tablet (40 mg total) by mouth daily. 30 tablet 1  . furosemide (LASIX) 20 MG tablet Take one tablet by mouth twice a day for 3 days. Then take one tablet by mouth daily after. 90 tablet 3  . spironolactone (ALDACTONE) 25 MG tablet Take 1 tablet (25 mg total) by mouth daily. Take at night 90 tablet 3   No current facility-administered medications for this visit.     Physical Exam: Vitals:   07/25/18 0923  BP: 132/76  Pulse: (!) 116  SpO2: 98%  Weight: 148 lb 6.4 oz (67.3 kg)  Height: 5\' 4"  (1.626 m)    GEN- The patient is well appearing, alert and oriented x 3 today.   Head- normocephalic, atraumatic Eyes-  Sclera clear, conjunctiva pink Ears- hearing intact Oropharynx- clear JVP 9cm Lungs- Clear to ausculation bilaterally, normal work of breathing Heart- Regular rate and rhythm, no murmurs, rubs or gallops, PMI not laterally displaced GI- soft, NT, ND, + BS Extremities- no clubbing, cyanosis, or edema  Wt Readings from Last 3  Encounters:  07/25/18 148 lb 6.4 oz (67.3 kg)  07/19/18 149 lb (67.6 kg)  06/30/18 153 lb 4.8 oz (69.5 kg)    EKG tracing ordered today is personally reviewed and shows atypical atrial flutter  We reviewed multiple Kardia tracings today.  She was last in sinus rhythm on Sunday.  Mostly afib/ atypical flutter since that time.  Assessment and Plan:  1. Persistent afib Remains in afib Continue amiodarone 400mg  BID Check TFTs Given reduced EF, I am reluctant to add beta blocker today Importance of compliance with eliquis discussed today  2. Acute on chronic systolic dysfunction A little better with lasix 20mg  bid Continue lasix 20mg  BID x 3 days then 20mg  daily bmet today Increase spironolactone to 25mg  qpm  Return in a week She will continue to perform Kardia tracings several times per day  Advanced heart failure and refractory atrial arrhythmias.  A high level of decision making was required for this encounter today.   Hillis Range MD, [facility] 07/25/2018 10:14 AM

## 2018-07-30 ENCOUNTER — Other Ambulatory Visit (HOSPITAL_COMMUNITY)
Admission: RE | Admit: 2018-07-30 | Discharge: 2018-07-30 | Disposition: A | Discharge status: Home / Self Care | Payer: Medicare Other | Source: Ambulatory Visit | Attending: Internal Medicine | Admitting: Internal Medicine

## 2018-07-30 ENCOUNTER — Telehealth (HOSPITAL_COMMUNITY): Payer: Medicare Other | Admitting: Nurse Practitioner

## 2018-07-30 ENCOUNTER — Encounter: Payer: Self-pay | Admitting: Internal Medicine

## 2018-07-30 ENCOUNTER — Ambulatory Visit (INDEPENDENT_AMBULATORY_CARE_PROVIDER_SITE_OTHER): Payer: Medicare Other | Admitting: Internal Medicine

## 2018-07-30 ENCOUNTER — Other Ambulatory Visit: Payer: Self-pay

## 2018-07-30 VITALS — BP 120/92 | HR 114 | Wt 148.8 lb

## 2018-07-30 DIAGNOSIS — Z01812 Encounter for preprocedural laboratory examination: Secondary | ICD-10-CM | POA: Insufficient documentation

## 2018-07-30 DIAGNOSIS — I5023 Acute on chronic systolic (congestive) heart failure: Secondary | ICD-10-CM | POA: Diagnosis not present

## 2018-07-30 DIAGNOSIS — Z1159 Encounter for screening for other viral diseases: Secondary | ICD-10-CM | POA: Diagnosis not present

## 2018-07-30 DIAGNOSIS — I4819 Other persistent atrial fibrillation: Secondary | ICD-10-CM | POA: Diagnosis not present

## 2018-07-30 LAB — BASIC METABOLIC PANEL
BUN/Creatinine Ratio: 18 (ref 12–28)
BUN: 35 mg/dL — ABNORMAL HIGH (ref 8–27)
CO2: 23 mmol/L (ref 20–29)
Calcium: 9.4 mg/dL (ref 8.7–10.3)
Chloride: 94 mmol/L — ABNORMAL LOW (ref 96–106)
Creatinine, Ser: 1.99 mg/dL — ABNORMAL HIGH (ref 0.57–1.00)
GFR calc Af Amer: 30 mL/min/{1.73_m2} — ABNORMAL LOW (ref >59)
GFR calc non Af Amer: 26 mL/min/{1.73_m2} — ABNORMAL LOW (ref >59)
Glucose: 103 mg/dL — ABNORMAL HIGH (ref 65–99)
Potassium: 3.8 mmol/L (ref 3.5–5.2)
Sodium: 136 mmol/L (ref 134–144)

## 2018-07-30 LAB — CBC WITH DIFFERENTIAL/PLATELET
Basophils Absolute: 0.1 10*3/uL (ref 0.0–0.2)
Basos: 2 %
EOS (ABSOLUTE): 0.2 10*3/uL (ref 0.0–0.4)
Eos: 3 %
Hematocrit: 43.9 % (ref 34.0–46.6)
Hemoglobin: 15 g/dL (ref 11.1–15.9)
Immature Grans (Abs): 0 10*3/uL (ref 0.0–0.1)
Immature Granulocytes: 0 %
Lymphocytes Absolute: 1.4 10*3/uL (ref 0.7–3.1)
Lymphs: 22 %
MCH: 31.5 pg (ref 26.6–33.0)
MCHC: 34.2 g/dL (ref 31.5–35.7)
MCV: 92 fL (ref 79–97)
Monocytes Absolute: 0.6 10*3/uL (ref 0.1–0.9)
Monocytes: 10 %
Neutrophils Absolute: 3.9 10*3/uL (ref 1.4–7.0)
Neutrophils: 63 %
Platelets: 221 10*3/uL (ref 150–450)
RBC: 4.76 x10E6/uL (ref 3.77–5.28)
RDW: 12.6 % (ref 11.7–15.4)
WBC: 6.1 10*3/uL (ref 3.4–10.8)

## 2018-07-30 NOTE — H&P (View-Only) (Signed)
 PCP: Jordan, [name], MD Primary Cardiologist: Dr Cooper Primary EP: Dr Loralai Eisman  Wanda [name] is a 68 y.o. female who presents today for urgent cardiology followup.   Seen in clinic last week and continued to struggle with atrial arrhythmias and heart failure. Lasix and spironolactone increased. She presents today for follow up and is feeling about the same. Primary complaint is fatigue. She remains SOB and fatigued with minimal exertion. She is fatigued after getting ready for this appointment today. No symptoms at rest. She denies palpitations, chest pain, dizziness, presyncope, or syncope. She has continued taking lasix 20 mg BID. She has not missed any doses of her Eliquis.   Past Medical History:  Diagnosis Date  . Anxiety   . Arthritis 2018   hands have general aches  . CHF (congestive heart failure) (HCC) 2018   has shortness of breath since CHF diagnosis and has increased recently; states this is reason for admission  . Chronic systolic heart failure (HCC) 05/25/2016   tachycardia mediated,  resolved previously with sinus rhythm  . Depression   . Dyspnea 2018  . Migraine   . Nonischemic cardiomyopathy (HCC)    tachycardia mediated,  resolved previously with sinus rhythm  . Persistent atrial fibrillation 05/13/2016  . PVC's (premature ventricular contractions) 05/25/2016   Holter 11/17 The basic rhythm is sinus. There are frequent PVCs, periods of ventricular bigeminy, and occasional ventricular couplets and triplets.  . S/P AVR (aortic valve replacement)    Past Surgical History:  Procedure Laterality Date  . AORTIC VALVE REPLACEMENT N/A 05/03/2016   Procedure: AORTIC VALVE REPLACEMENT (AVR) WITH MAGNA EASE PERICARDIAL BIOPROSTHESIS - AORTIC 21 MM MODEL 3300TFX;  Surgeon: Edward B Gerhardt, MD;  Location: [facility] OR;  Service: Open Heart Surgery;  Laterality: N/A;  . ATRIAL FIBRILLATION ABLATION N/A 06/28/2018   Procedure: ATRIAL FIBRILLATION ABLATION;  Surgeon: Topaz Raglin, MD;   Location: [facility] INVASIVE CV LAB;  Service: Cardiovascular;  Laterality: N/A;  . CARDIOVERSION N/A 02/16/2018   Procedure: CARDIOVERSION;  Surgeon: Nelson, Katarina H, MD;  Location: [facility] ENDOSCOPY;  Service: Cardiovascular;  Laterality: N/A;  . EYE SURGERY  2005   Lasik  . TEE WITHOUT CARDIOVERSION N/A 05/03/2016   Procedure: TRANSESOPHAGEAL ECHOCARDIOGRAM (TEE);  Surgeon: Edward B Gerhardt, MD;  Location: [facility] OR;  Service: Open Heart Surgery;  Laterality: N/A;  . TONSILLECTOMY AND ADENOIDECTOMY      ROS- all systems are reviewed and negatives except as per HPI above  Current Outpatient Medications  Medication Sig Dispense Refill  . acetaminophen (TYLENOL) 500 MG tablet Take 500-1,000 mg by mouth every 6 (six) hours as needed (for pain.).    . amiodarone (PACERONE) 200 MG tablet Take 2 tablets (400 mg total) by mouth 2 (two) times daily.    . apixaban (ELIQUIS) 5 MG TABS tablet Take 1 tablet (5 mg total) by mouth 2 (two) times daily. 60 tablet 11  . furosemide (LASIX) 20 MG tablet Take one tablet by mouth twice a day for 3 days. Then take one tablet by mouth daily after. 90 tablet 3  . LORazepam (ATIVAN) 0.5 MG tablet Take 0.5 mg by mouth daily as needed for anxiety.     . MELATONIN PO Take 1 tablet by mouth at bedtime as needed (sleep).    . pantoprazole (PROTONIX) 40 MG tablet Take 1 tablet (40 mg total) by mouth daily. 30 tablet 1  . spironolactone (ALDACTONE) 25 MG tablet Take 1 tablet (25 mg total) by mouth daily. Take   at night 90 tablet 3   No current facility-administered medications for this visit.     Physical Exam: Vitals:   07/30/18 0914  BP: (!) 120/92  Pulse: (!) 114  Weight: 148 lb 12.8 oz (67.5 kg)   GEN- Well appearing. A&O x 3   Head- Normocephalic, atraumatic.  Eyes- Sclera clear, conjunctiva pink.  Ears- Hearing intact.  Oropharynx- Clear JVP ~8 cm Lungs- CTAB, normal effort. No crackles.  Heart- Tachy, regular. No M/G/R appreciated. PMI normal.  GI- Soft, NT, ND,  +BS.  Extremities- No clubbing, cyanosis, or peripheral edema.    Wt Readings from Last 3 Encounters:  07/30/18 148 lb 12.8 oz (67.5 kg)  07/25/18 148 lb 6.4 oz (67.3 kg)  07/19/18 149 lb (67.6 kg)    EKG tracing performed today personally reviewed and shows atypical atrial flutter at 113 bpm  Multiple Kardia tracings reviewed today. Last noted sinus rhythm remains 07/22/18 in 80-90 bpm range. All other tracings range from 110-120s rates noted as "Tachycardia" or "Possible Afib".   Assessment and Plan:  1. Persistent afib/flutter She remains in symptomatic afi/flutter with rates mostly in 110s   Continue amiodarone 400mg  BID for now. TFTS normal 07/26/18 Discussed importance of compliance with eliquis regimen.  Will plan DCCV this week with follow up in Afib clinic. Labs today.  Risks and benefits to [facility] discussed at length by me today.  She wishes to proceed.  She has not missed any of her eliquis.  2. Acute on chronic systolic dysfunction Volume status looks stable on exam. NYHA III in afib as above. Continue lasix 20 mg daily, with additional 20 mg prn.  BMET today pre cardioversion.  Continue spironolactone  25mg  qhs Consider low dose BB once decompensation improved.   Follow-up in AF clinic 1 week post cardioversion.  Hillis Range MD, [facility] 07/30/2018 12:54 PM

## 2018-07-30 NOTE — Patient Instructions (Signed)
Dear Wanda [name]  You are scheduled for a TEE/Cardioversion/TEE Cardioversion on 08/02/18 with Dr. Johney Frame.  Please arrive at the [facility] (Main Entrance A) at [facility]: [address] [place], [place] [address] at 0915 am (1 hour prior to procedure unless lab work is needed)  You will need COVID-19 testing prior to the procedure. This has been arranged for today at [facility]. You will be ask to remain quarantined at home leading up to the procedure.   DIET: Nothing to eat or drink after midnight except a sip of water with medications (see medication instructions below)  Medication Instructions: Hold lasix that morning. Continue your anticoagulant: You will need to continue your anticoagulant after your procedure until you are told by your  Provider that it is safe to stop  Your lab work will be done today.  You must have a responsible person to drive you home and wait outside during your procedure. Failure to do so could result in cancellation.  Bring your insurance cards.  *Special Note: Every effort is made to have your procedure done on time. Occasionally there are emergencies that occur at the hospital that may cause delays. Please be patient if a delay does occur.

## 2018-07-30 NOTE — Progress Notes (Signed)
PCP: [place], [name], MD Primary Cardiologist: Dr Excell Seltzer Primary EP: Dr Danielle Rankin Wanda [name] is a 68 y.o. female who presents today for urgent cardiology followup.   Seen in clinic last week and continued to struggle with atrial arrhythmias and heart failure. Lasix and spironolactone increased. She presents today for follow up and is feeling about the same. Primary complaint is fatigue. She remains SOB and fatigued with minimal exertion. She is fatigued after getting ready for this appointment today. No symptoms at rest. She denies palpitations, chest pain, dizziness, presyncope, or syncope. She has continued taking lasix 20 mg BID. She has not missed any doses of her Eliquis.   Past Medical History:  Diagnosis Date  . Anxiety   . Arthritis 2018   hands have general aches  . CHF (congestive heart failure) (HCC) 2018   has shortness of breath since CHF diagnosis and has increased recently; states this is reason for admission  . Chronic systolic heart failure (HCC) 05/25/2016   tachycardia mediated,  resolved previously with sinus rhythm  . Depression   . Dyspnea 2018  . Migraine   . Nonischemic cardiomyopathy (HCC)    tachycardia mediated,  resolved previously with sinus rhythm  . Persistent atrial fibrillation 05/13/2016  . PVC's (premature ventricular contractions) 05/25/2016   Holter 11/17 The basic rhythm is sinus. There are frequent PVCs, periods of ventricular bigeminy, and occasional ventricular couplets and triplets.  . S/P AVR (aortic valve replacement)    Past Surgical History:  Procedure Laterality Date  . AORTIC VALVE REPLACEMENT N/A 05/03/2016   Procedure: AORTIC VALVE REPLACEMENT (AVR) WITH MAGNA EASE PERICARDIAL BIOPROSTHESIS - AORTIC 21 MM MODEL 3300TFX;  Surgeon: [name], MD;  Location: [facility] OR;  Service: Open Heart Surgery;  Laterality: N/A;  . ATRIAL FIBRILLATION ABLATION N/A 06/28/2018   Procedure: ATRIAL FIBRILLATION ABLATION;  Surgeon: Hillis Range, MD;   Location: [facility] INVASIVE CV LAB;  Service: Cardiovascular;  Laterality: N/A;  . CARDIOVERSION N/A 02/16/2018   Procedure: CARDIOVERSION;  Surgeon: Lars Masson, MD;  Location: [facility] ENDOSCOPY;  Service: Cardiovascular;  Laterality: N/A;  . EYE SURGERY  2005   Lasik  . TEE WITHOUT CARDIOVERSION N/A 05/03/2016   Procedure: TRANSESOPHAGEAL ECHOCARDIOGRAM (TEE);  Surgeon: [name], MD;  Location: [facility] OR;  Service: Open Heart Surgery;  Laterality: N/A;  . TONSILLECTOMY AND ADENOIDECTOMY      ROS- all systems are reviewed and negatives except as per HPI above  Current Outpatient Medications  Medication Sig Dispense Refill  . acetaminophen (TYLENOL) 500 MG tablet Take 500-1,000 mg by mouth every 6 (six) hours as needed (for pain.).    Marland Kitchen amiodarone (PACERONE) 200 MG tablet Take 2 tablets (400 mg total) by mouth 2 (two) times daily.    Marland Kitchen apixaban (ELIQUIS) 5 MG TABS tablet Take 1 tablet (5 mg total) by mouth 2 (two) times daily. 60 tablet 11  . furosemide (LASIX) 20 MG tablet Take one tablet by mouth twice a day for 3 days. Then take one tablet by mouth daily after. 90 tablet 3  . LORazepam (ATIVAN) 0.5 MG tablet Take 0.5 mg by mouth daily as needed for anxiety.     Marland Kitchen MELATONIN PO Take 1 tablet by mouth at bedtime as needed (sleep).    . pantoprazole (PROTONIX) 40 MG tablet Take 1 tablet (40 mg total) by mouth daily. 30 tablet 1  . spironolactone (ALDACTONE) 25 MG tablet Take 1 tablet (25 mg total) by mouth daily. Take  at night 90 tablet 3   No current facility-administered medications for this visit.     Physical Exam: Vitals:   07/30/18 0914  BP: (!) 120/92  Pulse: (!) 114  Weight: 148 lb 12.8 oz (67.5 kg)   GEN- Well appearing. A&O x 3   Head- Normocephalic, atraumatic.  Eyes- Sclera clear, conjunctiva pink.  Ears- Hearing intact.  Oropharynx- Clear JVP ~8 cm Lungs- CTAB, normal effort. No crackles.  Heart- Tachy, regular. No M/G/R appreciated. PMI normal.  GI- Soft, NT, ND,  +BS.  Extremities- No clubbing, cyanosis, or peripheral edema.    Wt Readings from Last 3 Encounters:  07/30/18 148 lb 12.8 oz (67.5 kg)  07/25/18 148 lb 6.4 oz (67.3 kg)  07/19/18 149 lb (67.6 kg)    EKG tracing performed today personally reviewed and shows atypical atrial flutter at 113 bpm  Multiple Kardia tracings reviewed today. Last noted sinus rhythm remains 07/22/18 in 80-90 bpm range. All other tracings range from 110-120s rates noted as "Tachycardia" or "Possible Afib".   Assessment and Plan:  1. Persistent afib/flutter She remains in symptomatic afi/flutter with rates mostly in 110s   Continue amiodarone 400mg  BID for now. TFTS normal 07/26/18 Discussed importance of compliance with eliquis regimen.  Will plan DCCV this week with follow up in Afib clinic. Labs today.  Risks and benefits to [facility] discussed at length by me today.  She wishes to proceed.  She has not missed any of her eliquis.  2. Acute on chronic systolic dysfunction Volume status looks stable on exam. NYHA III in afib as above. Continue lasix 20 mg daily, with additional 20 mg prn.  BMET today pre cardioversion.  Continue spironolactone  25mg  qhs Consider low dose BB once decompensation improved.   Follow-up in AF clinic 1 week post cardioversion.  Hillis Range MD, [facility] 07/30/2018 12:54 PM

## 2018-07-31 LAB — NOVEL CORONAVIRUS, NAA (HOSP ORDER, SEND-OUT TO REF LAB; TAT 18-24 HRS): SARS-CoV-2, NAA: NOT DETECTED

## 2018-08-02 ENCOUNTER — Ambulatory Visit (HOSPITAL_COMMUNITY): Payer: Medicare Other | Admitting: Anesthesiology

## 2018-08-02 ENCOUNTER — Encounter (HOSPITAL_COMMUNITY): Payer: Self-pay | Admitting: Cardiovascular Disease

## 2018-08-02 ENCOUNTER — Encounter (HOSPITAL_COMMUNITY)
Admission: RE | Disposition: A | Discharge status: Home / Self Care | Payer: Self-pay | Source: Home / Self Care | Attending: Cardiovascular Disease

## 2018-08-02 ENCOUNTER — Ambulatory Visit (HOSPITAL_COMMUNITY)
Admission: RE | Admit: 2018-08-02 | Discharge: 2018-08-02 | Disposition: A | Discharge status: Home / Self Care | Payer: Medicare Other | Attending: Cardiovascular Disease | Admitting: Cardiovascular Disease

## 2018-08-02 ENCOUNTER — Other Ambulatory Visit: Payer: Self-pay

## 2018-08-02 DIAGNOSIS — F329 Major depressive disorder, single episode, unspecified: Secondary | ICD-10-CM | POA: Diagnosis not present

## 2018-08-02 DIAGNOSIS — I4819 Other persistent atrial fibrillation: Secondary | ICD-10-CM | POA: Insufficient documentation

## 2018-08-02 DIAGNOSIS — I5022 Chronic systolic (congestive) heart failure: Secondary | ICD-10-CM | POA: Insufficient documentation

## 2018-08-02 DIAGNOSIS — I428 Other cardiomyopathies: Secondary | ICD-10-CM | POA: Diagnosis not present

## 2018-08-02 DIAGNOSIS — F419 Anxiety disorder, unspecified: Secondary | ICD-10-CM | POA: Insufficient documentation

## 2018-08-02 DIAGNOSIS — Z952 Presence of prosthetic heart valve: Secondary | ICD-10-CM | POA: Insufficient documentation

## 2018-08-02 DIAGNOSIS — Z79899 Other long term (current) drug therapy: Secondary | ICD-10-CM | POA: Diagnosis not present

## 2018-08-02 DIAGNOSIS — Z7901 Long term (current) use of anticoagulants: Secondary | ICD-10-CM | POA: Insufficient documentation

## 2018-08-02 DIAGNOSIS — I493 Ventricular premature depolarization: Secondary | ICD-10-CM | POA: Insufficient documentation

## 2018-08-02 DIAGNOSIS — I4892 Unspecified atrial flutter: Secondary | ICD-10-CM | POA: Diagnosis not present

## 2018-08-02 HISTORY — PX: CARDIOVERSION: SHX1299

## 2018-08-02 SURGERY — CARDIOVERSION
Anesthesia: General

## 2018-08-02 MED ORDER — LIDOCAINE 2% (20 MG/ML) 5 ML SYRINGE
INTRAMUSCULAR | Status: DC | PRN
  Administered 2018-08-02: 60 mg via INTRAVENOUS

## 2018-08-02 MED ORDER — PROPOFOL 10 MG/ML IV BOLUS
INTRAVENOUS | Status: DC | PRN
  Administered 2018-08-02: 60 mg via INTRAVENOUS

## 2018-08-02 MED ORDER — SODIUM CHLORIDE 0.9 % IV SOLN
INTRAVENOUS | Status: DC
  Administered 2018-08-02: 09:00:00 via INTRAVENOUS

## 2018-08-02 NOTE — Discharge Instructions (Signed)
Electrical Cardioversion, Care After °This sheet gives you information about how to care for yourself after your procedure. Your health care provider may also give you more specific instructions. If you have problems or questions, contact your health care provider. °What can I expect after the procedure? °After the procedure, it is common to have: °· Some redness on the skin where the shocks were given. °Follow these instructions at home: ° °· Do not drive for 24 hours if you were given a medicine to help you relax (sedative). °· Take over-the-counter and prescription medicines only as told by your health care provider. °· Ask your health care provider how to check your pulse. Check it often. °· Rest for 48 hours after the procedure or as told by your health care provider. °· Avoid or limit your caffeine use as told by your health care provider. °Contact a health care provider if: °· You feel like your heart is beating too quickly or your pulse is not regular. °· You have a serious muscle cramp that does not go away. °Get help right away if: ° °· You have discomfort in your chest. °· You are dizzy or you feel faint. °· You have trouble breathing or you are short of breath. °· Your speech is slurred. °· You have trouble moving an arm or leg on one side of your body. °· Your fingers or toes turn cold or blue. °This information is not intended to replace advice given to you by your health care provider. Make sure you discuss any questions you have with your health care provider. °Document Released: 12/19/2012 Document Revised: 10/02/2015 Document Reviewed: 09/04/2015 °Elsevier Interactive Patient Education © 2019 [employer] ° °

## 2018-08-02 NOTE — Anesthesia Procedure Notes (Signed)
Procedure Name: General with mask airway Date/Time: 08/02/2018 10:27 AM Performed by: Janora Norlander, CRNA Pre-anesthesia Checklist: Patient identified, Emergency Drugs available, Suction available, Patient being monitored and Timeout performed Patient Re-evaluated:Patient Re-evaluated prior to induction Oxygen Delivery Method: Ambu bag Preoxygenation: Pre-oxygenation with 100% oxygen Induction Type: IV induction

## 2018-08-02 NOTE — Interval H&P Note (Signed)
History and Physical Interval Note:  08/02/2018 10:24 AM  Wanda [name]  has presented today for surgery, with the diagnosis of a-fib.  The various methods of treatment have been discussed with the patient and family. After consideration of risks, benefits and other options for treatment, the patient has consented to  Procedure(s): CARDIOVERSION (N/A) as a surgical intervention.  The patient's history has been reviewed, patient examined, no change in status, stable for surgery.  I have reviewed the patient's chart and labs.  Questions were answered to the patient's satisfaction.     Chilton Si, MD

## 2018-08-02 NOTE — Transfer of Care (Signed)
Immediate Anesthesia Transfer of Care Note  Patient: Wanda [name]  Procedure(s) Performed: CARDIOVERSION (N/A )  Patient Location: Endoscopy Unit  Anesthesia Type:General  Level of Consciousness: awake, alert  and patient cooperative  Airway & Oxygen Therapy: Patient Spontanous Breathing  Post-op Assessment: Report given to RN and Post -op Vital signs reviewed and stable  Post vital signs: Reviewed and stable  Last Vitals:  Vitals Value Taken Time  BP    Temp    Pulse    Resp    SpO2      Last Pain:  Vitals:   08/02/18 0910  TempSrc: Oral  PainSc: 0-No pain         Complications: No apparent anesthesia complications

## 2018-08-02 NOTE — Anesthesia Preprocedure Evaluation (Signed)
Anesthesia Evaluation  Patient identified by MRN, date of birth, ID band Patient awake    Reviewed: Allergy & Precautions, NPO status , Patient's Chart, lab work & pertinent test results  Airway Mallampati: II  TM Distance: >3 FB Neck ROM: Full    Dental no notable dental hx.    Pulmonary neg pulmonary ROS, former smoker,    Pulmonary exam normal breath sounds clear to auscultation       Cardiovascular + dysrhythmias Atrial Fibrillation  Rhythm:Irregular Rate:Normal + Systolic murmurs S/p AVR   Neuro/Psych negative neurological ROS  negative psych ROS   GI/Hepatic negative GI ROS, Neg liver ROS,   Endo/Other  negative endocrine ROS  Renal/GU Renal InsufficiencyRenal disease  negative genitourinary   Musculoskeletal negative musculoskeletal ROS (+)   Abdominal   Peds negative pediatric ROS (+)  Hematology negative hematology ROS (+)   Anesthesia Other Findings   Reproductive/Obstetrics negative OB ROS                             Anesthesia Physical Anesthesia Plan  ASA: III  Anesthesia Plan: General   Post-op Pain Management:    Induction: Intravenous  PONV Risk Score and Plan: Treatment may vary due to age or medical condition  Airway Management Planned: Mask  Additional Equipment:   Intra-op Plan:   Post-operative Plan: Extubation in OR  Informed Consent: I have reviewed the patients History and Physical, chart, labs and discussed the procedure including the risks, benefits and alternatives for the proposed anesthesia with the patient or authorized representative who has indicated his/her understanding and acceptance.     Dental advisory given  Plan Discussed with: CRNA and Surgeon  Anesthesia Plan Comments:         Anesthesia Quick Evaluation

## 2018-08-02 NOTE — CV Procedure (Signed)
Electrical Cardioversion Procedure Note Wanda [name] [id number] [birth year]  Procedure: Electrical Cardioversion Indications:  Atrial Fibrillation  Procedure Details Consent: Risks of procedure as well as the alternatives and risks of each were explained to the (patient/caregiver).  Consent for procedure obtained. Time Out: Verified patient identification, verified procedure, site/side was marked, verified correct patient position, special equipment/implants available, medications/allergies/relevent history reviewed, required imaging and test results available.  Performed  Patient placed on cardiac monitor, pulse oximetry, supplemental oxygen as necessary.  Sedation given: propfolol 60mg , lidocaine 60 mg. Pacer pads placed anterior and posterior chest.  Cardioverted 1 time(s).  Cardioverted at 200J.  Evaluation Findings: Post procedure EKG shows: NSR Complications: None Patient did tolerate procedure well.   Chilton Si, MD 08/02/2018, 10:33 AM

## 2018-08-03 ENCOUNTER — Encounter (HOSPITAL_COMMUNITY): Payer: Self-pay | Admitting: Cardiovascular Disease

## 2018-08-07 ENCOUNTER — Telehealth (HOSPITAL_COMMUNITY): Payer: Self-pay | Admitting: *Deleted

## 2018-08-07 NOTE — Telephone Encounter (Signed)
I called pt to schedule a follow up for 1 week.  She wanted to know about the amiodarone dose.  Should she continue 800 bid, and what she can take for sleep?  Pt was advised per Jorja Loa, PA to continue loading dose until June 1st.  Pt understood and she was instructed to try either melatonin or unisom for a sleep aid.  Medication was discussed.

## 2018-08-10 NOTE — Anesthesia Postprocedure Evaluation (Signed)
Anesthesia Post Note  Patient: Wanda [name]  Procedure(s) Performed: CARDIOVERSION (N/A )     Patient location during evaluation: PACU Anesthesia Type: General Level of consciousness: awake and alert Pain management: pain level controlled Vital Signs Assessment: post-procedure vital signs reviewed and stable Respiratory status: spontaneous breathing, nonlabored ventilation, respiratory function stable and patient connected to nasal cannula oxygen Cardiovascular status: blood pressure returned to baseline and stable Postop Assessment: no apparent nausea or vomiting Anesthetic complications: no    Last Vitals:  Vitals:   08/02/18 1045 08/02/18 1055  BP: 111/77 110/78  Pulse: (!) 56 (!) 56  Resp: 17 19  SpO2: 98% 99%    Last Pain:  Vitals:   08/03/18 1234  TempSrc:   PainSc: 0-No pain                 [name] S

## 2018-08-13 ENCOUNTER — Other Ambulatory Visit: Payer: Self-pay

## 2018-08-13 ENCOUNTER — Ambulatory Visit (HOSPITAL_COMMUNITY)
Admission: RE | Admit: 2018-08-13 | Discharge: 2018-08-13 | Disposition: A | Discharge status: Home / Self Care | Payer: Medicare Other | Source: Ambulatory Visit | Attending: Nurse Practitioner | Admitting: Nurse Practitioner

## 2018-08-13 VITALS — BP 118/74 | HR 72 | Ht 64.0 in | Wt 151.0 lb

## 2018-08-13 DIAGNOSIS — Z801 Family history of malignant neoplasm of trachea, bronchus and lung: Secondary | ICD-10-CM | POA: Diagnosis not present

## 2018-08-13 DIAGNOSIS — Z885 Allergy status to narcotic agent status: Secondary | ICD-10-CM | POA: Diagnosis not present

## 2018-08-13 DIAGNOSIS — Z803 Family history of malignant neoplasm of breast: Secondary | ICD-10-CM | POA: Diagnosis not present

## 2018-08-13 DIAGNOSIS — Z79899 Other long term (current) drug therapy: Secondary | ICD-10-CM | POA: Insufficient documentation

## 2018-08-13 DIAGNOSIS — Z87891 Personal history of nicotine dependence: Secondary | ICD-10-CM | POA: Diagnosis not present

## 2018-08-13 DIAGNOSIS — I4819 Other persistent atrial fibrillation: Secondary | ICD-10-CM | POA: Insufficient documentation

## 2018-08-13 DIAGNOSIS — Z886 Allergy status to analgesic agent status: Secondary | ICD-10-CM | POA: Diagnosis not present

## 2018-08-13 DIAGNOSIS — I480 Paroxysmal atrial fibrillation: Secondary | ICD-10-CM | POA: Diagnosis not present

## 2018-08-13 DIAGNOSIS — Z8249 Family history of ischemic heart disease and other diseases of the circulatory system: Secondary | ICD-10-CM | POA: Insufficient documentation

## 2018-08-13 DIAGNOSIS — I428 Other cardiomyopathies: Secondary | ICD-10-CM | POA: Diagnosis not present

## 2018-08-13 DIAGNOSIS — Z952 Presence of prosthetic heart valve: Secondary | ICD-10-CM | POA: Insufficient documentation

## 2018-08-13 DIAGNOSIS — F329 Major depressive disorder, single episode, unspecified: Secondary | ICD-10-CM | POA: Diagnosis not present

## 2018-08-13 DIAGNOSIS — Z7901 Long term (current) use of anticoagulants: Secondary | ICD-10-CM | POA: Insufficient documentation

## 2018-08-13 DIAGNOSIS — Z833 Family history of diabetes mellitus: Secondary | ICD-10-CM | POA: Diagnosis not present

## 2018-08-13 DIAGNOSIS — Z888 Allergy status to other drugs, medicaments and biological substances status: Secondary | ICD-10-CM | POA: Diagnosis not present

## 2018-08-13 DIAGNOSIS — F419 Anxiety disorder, unspecified: Secondary | ICD-10-CM | POA: Insufficient documentation

## 2018-08-13 DIAGNOSIS — I5042 Chronic combined systolic (congestive) and diastolic (congestive) heart failure: Secondary | ICD-10-CM | POA: Diagnosis not present

## 2018-08-13 DIAGNOSIS — Z8041 Family history of malignant neoplasm of ovary: Secondary | ICD-10-CM | POA: Diagnosis not present

## 2018-08-13 LAB — BASIC METABOLIC PANEL
Anion gap: 11 (ref 5–15)
BUN: 25 mg/dL — ABNORMAL HIGH (ref 8–23)
CO2: 23 mmol/L (ref 22–32)
Calcium: 9.5 mg/dL (ref 8.9–10.3)
Chloride: 105 mmol/L (ref 98–111)
Creatinine, Ser: 1.62 mg/dL — ABNORMAL HIGH (ref 0.44–1.00)
GFR calc Af Amer: 38 mL/min — ABNORMAL LOW (ref >60)
GFR calc non Af Amer: 33 mL/min — ABNORMAL LOW (ref >60)
Glucose, Bld: 104 mg/dL — ABNORMAL HIGH (ref 70–99)
Potassium: 4.2 mmol/L (ref 3.5–5.1)
Sodium: 139 mmol/L (ref 135–145)

## 2018-08-13 MED ORDER — AMIODARONE HCL 200 MG PO TABS: 200.0000 mg | ORAL_TABLET | Freq: Two times a day (BID) | ORAL | Status: DC

## 2018-08-13 NOTE — Progress Notes (Signed)
Primary Care Physician: [place], [name], MD Referring Physician: Dr. Excell [name] Primary EP: Dr Wanda [name] is a 68 y.o. female with a [name]/o  aortic insufficiency, combined systolic and diastolic CHF, non-ischemic dilated CM.She underwent bioprosthetic AVR by Dr. Tyrone [name] in February 2018.  Postoperative course was complicated by atrial fibrillation with a rapid rate requiring IV amiodarone.  Echo in June 2019 demonstrated improved LV function with an EF of 55-60% and mild diastolic   She was seen by Dr. Excell [name] 02/01/18 after an admission to the hospital in [facility] with atrial fibrillation with rapid rate complicated by congestive heart failure.  She was started on Apixaban for anticoagulation and diltiazem and metoprolol for rate control.  Echo done in the hospital in [name] demonstrated an EF of 45-50 and a normally functioning aortic valve prosthesis.  Elective cardioversion was arranged after 3 weeks of uninterrupted anticoagulation.  She underwent cardioversion on 02/16/2018 with restoration of normal sinus rhythm (after 4 attempts-120 J, 150 J, 200 J x 2).  She had f/u with Wanda [name], Wanda [name] on 12/20. She felt better for a few days but went back into afib with feeling more fatigued and short of breath.  She is now in the afib clinic, 12/23, to discuss options to restore SR. She does not feel terrible in afib but better in SR. She is dealing with a lot of stress with substance abuse in her son and her concerns for his health. He is currently living with the pt and her husband and going thru detox. She feels this is what made her go back in  afib so quickly. Wanda [name] thought that Tikosyn may be a good option but pt will have to check on cost of drug.  F/u in AF clinic 07/20/18. Patient is s/p AF ablation with Dr Wanda [name] 06/28/18. Recall she failed Tikosyn 2/2 QT prolongation. She feels more heart racing over the last few days with increased SOB and fatigue. She reports that she  becomes SOB even just walking from one room to the next. She admits that she has significant life stressors right now with ill family members and the passing of the family dog. She brings in her Kardia strips which show paroxysms of afib with RVR and also SR. She does have some abdominal fullness and orthopnea which improved with PRN Lasix. No swallowing or groin issues.  F/u in AF clinic 08/13/18. S/p DCCV on 08/02/18. She reports that she feels improved with less SOB and more energy. She states her hands are not as swollen although she does have abdominal fullness at times. She has not taken an extra dose of Lasix. She reports that her Wanda [name] has shown SR ever since her DCCV.  Today, she denies symptoms of palpitations, chest pain, PND, lower extremity edema, dizziness, presyncope, syncope, or neurologic sequela. The patient is tolerating medications without difficulties and is otherwise without complaint today. +nausea  Past Medical History:  Diagnosis Date   Anxiety    Arthritis 2018   hands have general aches   CHF (congestive heart failure) (HCC) 2018   has shortness of breath since CHF diagnosis and has increased recently; states this is reason for admission   Chronic systolic heart failure (HCC) 05/25/2016   tachycardia mediated,  resolved previously with sinus rhythm   Depression    Dyspnea 2018   Migraine    Nonischemic cardiomyopathy (HCC)    tachycardia mediated,  resolved previously with sinus rhythm   Persistent  atrial fibrillation 05/13/2016   PVC's (premature ventricular contractions) 05/25/2016   Holter 11/17 The basic rhythm is sinus. There are frequent PVCs, periods of ventricular bigeminy, and occasional ventricular couplets and triplets.   S/P AVR (aortic valve replacement)    Past Surgical History:  Procedure Laterality Date   AORTIC VALVE REPLACEMENT N/A 05/03/2016   Procedure: AORTIC VALVE REPLACEMENT (AVR) WITH MAGNA EASE PERICARDIAL BIOPROSTHESIS - AORTIC  21 MM MODEL 3300TFX;  Surgeon: Wanda OvensEdward B Gerhardt, MD;  [facility];  Service: Open Heart Surgery;  Laterality: N/A;   ATRIAL FIBRILLATION ABLATION N/A 06/28/2018   Procedure: ATRIAL FIBRILLATION ABLATION;  Surgeon: Wanda RangeAllred, James, MD;  Location: [facility] INVASIVE CV LAB;  Service: Cardiovascular;  Laterality: N/A;   CARDIOVERSION N/A 02/16/2018   Procedure: CARDIOVERSION;  Surgeon: Wanda MassonNelson, Katarina H, MD;  Location: [facility] END[facility];  Service: Cardiovascular;  Laterality: N/A;   CARDIOVERSION N/A 08/02/2018   Procedure: CARDIOVERSION;  Surgeon: Wanda Siandolph, Tiffany, MD;  [facility];  Service: Cardiovascular;  Laterality: N/A;   EYE SURGERY  2005   Lasik   TEE WITHOUT CARDIOVERSION N/A 05/03/2016   Procedure: TRANSESOPHAGEAL ECHOCARDIOGRAM (TEE);  Surgeon: Wanda OvensEdward B Gerhardt, MD;  [facility];  Service: Open Heart Surgery;  Laterality: N/A;   TONSILLECTOMY AND ADENOIDECTOMY      Current Outpatient Medications  Medication Sig Dispense Refill   acetaminophen (TYLENOL) 500 MG tablet Take 500-1,000 mg by mouth every 6 (six) hours as needed (for pain.).     amiodarone (PACERONE) 200 MG tablet Take 2 tablets (400 mg total) by mouth 2 (two) times daily.     apixaban (ELIQUIS) 5 MG TABS tablet Take 1 tablet (5 mg total) by mouth 2 (two) times daily. 60 tablet 11   furosemide (LASIX) 20 MG tablet Take one tablet by mouth twice a day for 3 days. Then take one tablet by mouth daily after. (Patient taking differently: Take 20 mg by mouth daily. ) 90 tablet 3   LORazepam (ATIVAN) 0.5 MG tablet Take 0.5 mg by mouth daily as needed for anxiety.      MELATONIN PO Take 1 tablet by mouth at bedtime as needed (sleep).     pantoprazole (PROTONIX) 40 MG tablet Take 1 tablet (40 mg total) by mouth daily. 30 tablet 1   spironolactone (ALDACTONE) 25 MG tablet Take 1 tablet (25 mg total) by mouth daily. Take at night 90 tablet 3   No current facility-administered medications for this encounter.      Allergies  Allergen Reactions   Tikosyn [Dofetilide] Other (See Comments)    Prolonged QT, nonsustained torsades at low dose   Codeine Nausea And Vomiting   Vicodin [Hydrocodone-Acetaminophen] Nausea And Vomiting    Social History   Socioeconomic History   Marital status: Married    Spouse name: Not on file   Number of children: Not on file   Years of education: Not on file   Highest education level: Not on file  Occupational History   Not on file  Social Needs   Financial resource strain: Not on file   Food insecurity:    Worry: Not on file    Inability: Not on file   Transportation needs:    Medical: Not on file    Non-medical: Not on file  Tobacco Use   Smoking status: Former Smoker   Smokeless tobacco: Never Used   Tobacco comment: stopped in 70's while in college; smoked 1-2 years  Substance and Sexual Activity   Alcohol use: Yes  Comment: 1-2 glasses of wine; hasn't had wine for last month   Drug use: No   Sexual activity: Yes    Partners: Male    Birth control/protection: Other-see comments, Post-menopausal    Comment: vasectomy  Lifestyle   Physical activity:    Days per week: Not on file    Minutes per session: Not on file   Stress: Not on file  Relationships   Social connections:    Talks on phone: Not on file    Gets together: Not on file    Attends religious service: Not on file    Active member of club or organization: Not on file    Attends meetings of clubs or organizations: Not on file    Relationship status: Not on file   Intimate partner violence:    Fear of current or ex partner: Not on file    Emotionally abused: Not on file    Physically abused: Not on file    Forced sexual activity: Not on file  Other Topics Concern   Not on file  Social History Narrative   Lives in [place]   retired    Family History  Problem Relation Age of Onset   Diabetes Mother    Ovarian cancer Maternal Grandmother 55    Breast cancer Sister 69   Congestive Heart Failure Maternal Grandfather    Heart disease Brother        valve replacement   Deep vein thrombosis Brother    Lung cancer Brother    Deep vein thrombosis Father    Deep vein thrombosis Paternal Aunt        x2   Deep vein thrombosis Paternal Grandmother     ROS- All systems are reviewed and negative except as per the HPI above  Physical Exam: Vitals:   08/13/18 1108  Weight: 68.5 kg  Height: 5\' 4"  (1.626 m)   Wt Readings from Last 3 Encounters:  08/13/18 68.5 kg  08/02/18 67.5 kg  07/30/18 67.5 kg    Labs: Lab Results  Component Value Date   NA 136 07/30/2018   K 3.8 07/30/2018   CL 94 (L) 07/30/2018   CO2 23 07/30/2018   GLUCOSE 103 ([name]) 07/30/2018   BUN 35 ([name]) 07/30/2018   CREATININE 1.99 ([name]) 07/30/2018   CALCIUM 9.4 07/30/2018   MG 2.1 07/25/2018   Lab Results  Component Value Date   INR 1.8 07/07/2016   Lab Results  Component Value Date   CHOL 247 ([name]) 08/18/2015   HDL 108 08/18/2015   LDLCALC 124 08/18/2015   TRIG 73 08/18/2015    GEN- The patient is well appearing, alert and oriented x 3 today.   HEENT-head normocephalic, atraumatic, sclera clear, conjunctiva pink, hearing intact, trachea midline. Lungs- Clear to ausculation bilaterally, normal work of breathing Heart- Regular rate and rhythm, no murmurs, rubs or gallops  GI- soft, NT, ND, + BS Extremities- no clubbing, cyanosis, or edema MS- no significant deformity or atrophy Skin- no rash or lesion Psych- euthymic mood, full affect Neuro- strength and sensation are intact   EKG- SR HR 72, LBBB, PR 206, QRS 146, QTc 525  Other studies personally reviewed: All recent Epic documents/notes/ labs reviewed   Echo 06/26/18 1. The left ventricle has severely reduced systolic function, with an ejection fraction of 20-25%. The cavity size was normal. There is mildly increased left ventricular wall thickness. Left ventricular diastolic function could  not be evaluated  secondary to atrial fibrillation. Left ventricular  diffuse hypokinesis.  2. Left atrial size was moderately dilated.  3. Right atrial size was moderately dilated.  4. The mitral valve is grossly normal. Mild thickening of the mitral valve leaflet. Mitral valve regurgitation is mild to moderate by color flow Doppler.  5. The tricuspid valve is grossly normal.  6. Aortic valve regurgitation is trivial by color flow Doppler.  7. Severe global reduction in LV systolic function; mild LVH; moderate biatrial elargement; s/p AVR with trace AI; mild to moderate MR.  Epic notes reviewed.  Assessment and Plan: 1. Paroxysmal atrial fibrillation S/p ablation with Dr Wanda [name] 06/28/18 with ERAF. S/p DCCV 08/02/18. Appears to be maintaining SR with symptomatic improvement. Will decrease amiodarone to 200 mg BID Continue Eliquis 5 mg BID Lifestyle modification was discussed and encouraged including regular physical activity.  This patients CHA2DS2-VASc Score and unadjusted Ischemic Stroke Rate (% per year) is equal to 3.2 % stroke rate/year from a score of 3  Above score calculated as 1 point each if present [CHF, HTN, DM, Vascular=MI/PAD/Aortic Plaque, Age if 65-74, or Female] Above score calculated as 2 points each if present [Age > 75, or Stroke/TIA/TE]  2. Chronic combined systolic and diastolic CHF EF 83-33%. Likely related to AF. Weight stable. No overt signs of fluid overload today. Continue spironolactone 25 mg daily.  May take Lasix 20 mg PRN. Discussed avoiding dehydration. 2 gram sodium diet. Would consider low dose BB if EF still reduced. Echo scheduled 6/12.   Follow up with Dr Wanda [name] and Dr Wanda [name] as scheduled. AF clinic in 4 months.    Wanda Loa [name] [address] [place], [place] [address] [phone]

## 2018-08-13 NOTE — Patient Instructions (Signed)
Decrease Amiodarone to 200 mg twice a day  See [place] back in 4 months, and please call if you need anything in the interim.

## 2018-08-14 ENCOUNTER — Telehealth: Payer: Self-pay

## 2018-08-14 NOTE — Telephone Encounter (Signed)
The patient was scheduled for echo and OV with Dr.Cooper 6/12. Rescheduled the patient's echo to 6/11 and switched OV to virtual visit 6/12. The patient was grateful for call and agrees with treatment plan.    COVID-19 Pre-Screening Questions for echo:  Marland Kitchen In the past 7 to 10 days have you had a cough,  shortness of breath, headache, congestion, fever (100 or greater) body aches, chills, sore throat, or sudden loss of taste or sense of smell? NO . Have you been around anyone with known Covid 19. NO . Have you been around anyone who is awaiting Covid 19 test results in the past 7 to 10 days? NO Have you been around anyone who has been exposed to Covid 19, or has mentioned symptoms of Covid 19 within the past 7 to 10 days? NO  If you have any concerns/questions about symptoms patients report during screening (either on the phone or at threshold). Contact the provider seeing the patient or DOD for further guidance.  If neither are available contact a member of the leadership team.     Virtual Visit Pre-Appointment Phone Cal  Confirm consent - "In the setting of the current Covid19 crisis, you are scheduled for a (phone or video) visit with your provider on (date) at (time).  Just as we do with many in-office visits, in order for you to participate in this visit, we must obtain consent.  If you'd like, I can send this to your mychart (if signed up) or email for you to review.  Otherwise, I can obtain your verbal consent now.  All virtual visits are billed to your insurance company just like a normal visit would be.  By agreeing to a virtual visit, we'd like you to understand that the technology does not allow for your provider to perform an examination, and thus may limit your provider's ability to fully assess your condition. If your provider identifies any concerns that need to be evaluated in person, we will make arrangements to do so.  Finally, though the technology is pretty good, we cannot assure  that it will always work on either your or our end, and in the setting of a video visit, we may have to convert it to a phone-only visit.  In either situation, we cannot ensure that we have a secure connection.  Are you willing to proceed?" STAFF: Did the patient verbally acknowledge consent to telehealth visit? Document YES/NO here: YES   TELEPHONE CALL NOTE  Wanda [name] has been deemed a candidate for a follow-up tele-health visit to limit community exposure during the Covid-19 pandemic. I spoke with the patient via phone to ensure availability of phone/video source, confirm preferred email & phone number, and discuss instructions and expectations.  I reminded Wanda [name] to be prepared with any vital sign and/or heart rhythm information that could potentially be obtained via home monitoring, at the time of her visit. I reminded Wanda [name] to expect a phone call prior to her visit.   IF USING DOXIMITY or [email or web address] - The patient will receive a link just prior to their visit by text.

## 2018-08-22 ENCOUNTER — Telehealth (HOSPITAL_COMMUNITY): Payer: Self-pay | Admitting: Radiology

## 2018-08-22 NOTE — Telephone Encounter (Signed)

## 2018-08-22 NOTE — Telephone Encounter (Signed)
Left message for patient to call back  

## 2018-08-23 ENCOUNTER — Other Ambulatory Visit (HOSPITAL_COMMUNITY): Payer: Medicare Other

## 2018-08-24 ENCOUNTER — Encounter: Payer: Self-pay | Admitting: Cardiovascular Disease

## 2018-08-24 ENCOUNTER — Telehealth (INDEPENDENT_AMBULATORY_CARE_PROVIDER_SITE_OTHER): Payer: Medicare Other | Admitting: Cardiovascular Disease

## 2018-08-24 ENCOUNTER — Other Ambulatory Visit (HOSPITAL_COMMUNITY): Payer: Medicare Other

## 2018-08-24 ENCOUNTER — Telehealth (HOSPITAL_COMMUNITY): Payer: Self-pay | Admitting: Radiology

## 2018-08-24 ENCOUNTER — Other Ambulatory Visit: Payer: Self-pay

## 2018-08-24 VITALS — BP 125/92 | HR 80 | Ht 64.0 in | Wt 148.0 lb

## 2018-08-24 DIAGNOSIS — I4819 Other persistent atrial fibrillation: Secondary | ICD-10-CM

## 2018-08-24 DIAGNOSIS — I5022 Chronic systolic (congestive) heart failure: Secondary | ICD-10-CM

## 2018-08-24 NOTE — Telephone Encounter (Signed)

## 2018-08-24 NOTE — Progress Notes (Signed)
Virtual Visit via Video Note   This visit type was conducted due to national recommendations for restrictions regarding the COVID-19 Pandemic (e.g. social distancing) in an effort to limit this patient's exposure and mitigate transmission in our community.  Due to her co-morbid illnesses, this patient is at least at moderate risk for complications without adequate follow up.  This format is felt to be most appropriate for this patient at this time.  All issues noted in this document were discussed and addressed.  A limited physical exam was performed with this format.  Please refer to the patient's chart for her consent to telehealth for [facility].   Date:  08/24/2018   ID:  Wanda [name], DOB 12/12/1950, MRN [id number]  Patient Location: Home Provider Location: Home  PCP:  [place], [name], MD  Cardiologist:  Tonny BollmanMichael Shaneta Cervenka, MD  Electrophysiologist:  None   Evaluation Performed:  Follow-Up Visit  Chief Complaint:  Shortness of breath  History of Present Illness:    Wanda [name] is a 68 y.o. female with history of persistent atrial fibrillation and chronic systolic heart failure with very severe LV dysfunction.  She has had decompensated heart failure in the context of uncontrolled atrial fibrillation.  The patient underwent bioprosthetic aortic valve replacement in 2018.  LV function was normal in 2019 with an EF of 55 to 60%.  She underwent cardioversion for atrial fibrillation in December 2019 but had recurrent atrial fibrillation again.  She ultimately underwent ablation in April 2020 and then had ERAF post-ablation.  She was started on amiodarone and underwent repeat cardioversion, now maintaining sinus rhythm.  The patient is beginning to feel better.  Her breathing is improved.  She has no more orthopnea or abdominal swelling.  She has no more pain in her chest with deep breathing.  She related all of this to edema.  She is tolerating Lasix and Spironolactone.  She has been following  her heart monitor and is maintaining sinus rhythm now on a consistent basis.  Her amiodarone was recently reduced from 400 mg twice daily to 200 mg twice daily.  She has concerns about long-term amiodarone and we discussed this today.  She understands that her dose will continue to be reduced over time.  She continues to have shortness of breath with walking any moderate distance and she is not able to walk up hills yet.  The patient does not have symptoms concerning for COVID-19 infection (fever, chills, cough, or new shortness of breath).    Past Medical History:  Diagnosis Date   Anxiety    Arthritis 2018   hands have general aches   CHF (congestive heart failure) (HCC) 2018   has shortness of breath since CHF diagnosis and has increased recently; states this is reason for admission   Chronic systolic heart failure (HCC) 05/25/2016   tachycardia mediated,  resolved previously with sinus rhythm   Depression    Dyspnea 2018   Migraine    Nonischemic cardiomyopathy (HCC)    tachycardia mediated,  resolved previously with sinus rhythm   Persistent atrial fibrillation 05/13/2016   PVC's (premature ventricular contractions) 05/25/2016   Holter 11/17 The basic rhythm is sinus. There are frequent PVCs, periods of ventricular bigeminy, and occasional ventricular couplets and triplets.   S/P AVR (aortic valve replacement)    Past Surgical History:  Procedure Laterality Date   AORTIC VALVE REPLACEMENT N/A 05/03/2016   Procedure: AORTIC VALVE REPLACEMENT (AVR) WITH MAGNA EASE PERICARDIAL BIOPROSTHESIS - AORTIC 21  MM MODEL 3300TFX;  Surgeon: [name], MD;  [facility];  Service: Open Heart Surgery;  Laterality: N/A;   ATRIAL FIBRILLATION ABLATION N/A 06/28/2018   Procedure: ATRIAL FIBRILLATION ABLATION;  Surgeon: Hillis Range, MD;  Location: [facility] INVASIVE CV LAB;  Service: Cardiovascular;  Laterality: N/A;   CARDIOVERSION N/A 02/16/2018   Procedure: CARDIOVERSION;  Surgeon:  Lars Masson, MD;  Location: [facility] ENDOSCOPY;  Service: Cardiovascular;  Laterality: N/A;   CARDIOVERSION N/A 08/02/2018   Procedure: CARDIOVERSION;  Surgeon: Chilton Si, MD;  Location: [facility] ENDOSCOPY;  Service: Cardiovascular;  Laterality: N/A;   EYE SURGERY  2005   Lasik   TEE WITHOUT CARDIOVERSION N/A 05/03/2016   Procedure: TRANSESOPHAGEAL ECHOCARDIOGRAM (TEE);  Surgeon: [name], MD;  Location: [facility] OR;  Service: Open Heart Surgery;  Laterality: N/A;   TONSILLECTOMY AND ADENOIDECTOMY       Current Meds  Medication Sig   acetaminophen (TYLENOL) 500 MG tablet Take 500-1,000 mg by mouth every 6 (six) hours as needed (for pain.).   amiodarone (PACERONE) 200 MG tablet Take 1 tablet (200 mg total) by mouth 2 (two) times daily.   apixaban (ELIQUIS) 5 MG TABS tablet Take 1 tablet (5 mg total) by mouth 2 (two) times daily.   furosemide (LASIX) 20 MG tablet Take 20 mg by mouth daily.     Allergies:   Tikosyn [dofetilide], Codeine, and Vicodin [hydrocodone-acetaminophen]   Social History   Tobacco Use   Smoking status: Former Smoker   Smokeless tobacco: Never Used   Tobacco comment: stopped in 70's while in college; smoked 1-2 years  Substance Use Topics   Alcohol use: Yes    Comment: 1-2 glasses of wine; hasn't had wine for last month   Drug use: No     Family Hx: The patient's family history includes Breast cancer (age of onset: 82) in her sister; Congestive Heart Failure in her maternal grandfather; Deep vein thrombosis in her brother, father, paternal aunt, and paternal grandmother; Diabetes in her mother; Heart disease in her brother; Lung cancer in her brother; Ovarian cancer (age of onset: 11) in her maternal grandmother.  ROS:   Please see the history of present illness.    All other systems reviewed and are negative.   Prior CV studies:   The following studies were reviewed today:  Echo 06/26/2018: IMPRESSIONS    1. The left ventricle has  severely reduced systolic function, with an ejection fraction of 20-25%. The cavity size was normal. There is mildly increased left ventricular wall thickness. Left ventricular diastolic function could not be evaluated  secondary to atrial fibrillation. Left ventricular diffuse hypokinesis.  2. Left atrial size was moderately dilated.  3. Right atrial size was moderately dilated.  4. The mitral valve is grossly normal. Mild thickening of the mitral valve leaflet. Mitral valve regurgitation is mild to moderate by color flow Doppler.  5. The tricuspid valve is grossly normal.  6. Aortic valve regurgitation is trivial by color flow Doppler.  7. Severe global reduction in LV systolic function; mild LVH; moderate biatrial elargement; s/p AVR with trace AI; mild to moderate MR.  FINDINGS  Left Ventricle: The left ventricle has severely reduced systolic function, with an ejection fraction of 20-25%. The cavity size was normal. There is mildly increased left ventricular wall thickness. Left ventricular diastolic function could not be  evaluated secondary to atrial fibrillation. Left ventricular diffuse hypokinesis. Right Ventricle: The right ventricle has normal systolic function. The cavity was normal. There  is right vetricular wall thickness was not assessed. Left Atrium: Left atrial size was moderately dilated. Right Atrium: Right atrial size was moderately dilated. Right atrial pressure is estimated at 3 mmHg.   Pericardium: There is no evidence of pericardial effusion. Mitral Valve: The mitral valve is grossly normal. Mild thickening of the mitral valve leaflet. Mitral valve regurgitation is mild to moderate by color flow Doppler. Tricuspid Valve: The tricuspid valve is grossly normal. Tricuspid valve regurgitation is mild by color flow Doppler. Aortic Valve: Aortic valve regurgitation is trivial by color flow Doppler. A bioprosthetic aortic valve valve is present in the aortic position. Pulmonic  Valve: The pulmonic valve was not well visualized. Pulmonic valve regurgitation was not assessed by color flow Doppler. Venous: The inferior vena cava is normal in size with greater than 50% respiratory variability. Additional Comments: Severe global reduction in LV systolic function; mild LVH; moderate biatrial elargement; s/p AVR with trace AI; mild to moderate MR.   Labs/Other Tests and Data Reviewed:    EKG:  An ECG dated 08/13/2018 was personally reviewed today and demonstrated:  Normal sinus rhythm with left bundle branch block  Recent Labs: 06/26/2018: B Natriuretic Peptide 762.2 07/25/2018: Magnesium 2.1; TSH 2.060 07/30/2018: Hemoglobin 15.0; Platelets 221 08/13/2018: BUN 25; Creatinine, Ser 1.62; Potassium 4.2; Sodium 139   Recent Lipid Panel Lab Results  Component Value Date/Time   CHOL 247 (H) 08/18/2015 04:00 PM   TRIG 73 08/18/2015 04:00 PM   HDL 108 08/18/2015 04:00 PM   CHOLHDL 2.3 08/18/2015 04:00 PM   LDLCALC 124 08/18/2015 04:00 PM    Wt Readings from Last 3 Encounters:  08/24/18 148 lb (67.1 kg)  08/13/18 151 lb (68.5 kg)  08/02/18 148 lb 13 oz (67.5 kg)     Objective:    Vital Signs:  BP (!) 125/92    Pulse 80    Ht 5\' 4"  (1.626 m)    Wt 148 lb (67.1 kg)    LMP 08/12/2004    BMI 25.40 kg/m    VITAL SIGNS:  reviewed The patient is alert, oriented, in no distress.  She is breathing comfortably in normal conversation.  Remaining exam deferred secondary to telehealth visit type.  ASSESSMENT & PLAN:    1. Chronic systolic heart failure: The patient appears clinically improved.  Maintenance of sinus rhythm is key moving forward.  She has a tachycardia-mediated cardiomyopathy and she has become very ill with uncontrolled atrial fibrillation.  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  I had initially thought about starting her on losartan at low dose, but I think it is best to follow-up her labs prior to changing  medications.  She is currently taking furosemide 20 mg and Spironolactone 25 mg daily.  She will have repeat labs drawn next week.  Will either start her on a beta-blocker or ARB depending on her echo results and lab results.  Her echo is scheduled for Monday. 2. 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  Patient continues on oral anticoagulation.  She follows SBE prophylaxis per guideline recommendations. 3. Persistent atrial fibrillation: Appears to be maintaining sinus rhythm on amiodarone after undergoing atrial fib ablation.  She is followed closely by Dr. Johney FrameAllred in the A. fib clinic.  Appreciate their help.  COVID-19 Education: The signs and symptoms of COVID-19 were discussed with the patient and how to seek care for testing (follow  up with PCP or arrange E-visit). The importance of social distancing was discussed today.  Time:   Today, I have spent 15 minutes with the patient with telehealth technology discussing the above problems.     Medication Adjustments/Labs and Tests Ordered: Current medicines are reviewed at length with the patient today.  Concerns regarding medicines are outlined above.   Tests Ordered: No orders of the defined types were placed in this encounter.   Medication Changes: No orders of the defined types were placed in this encounter.   Follow Up:  Virtual Visit or In Person in 2 month(s)  Signed, Sherren Mocha, MD  08/24/2018 2:51 PM    [facility]

## 2018-08-27 ENCOUNTER — Other Ambulatory Visit: Payer: Self-pay

## 2018-08-27 ENCOUNTER — Other Ambulatory Visit: Payer: Medicare Other | Admitting: *Deleted

## 2018-08-27 ENCOUNTER — Ambulatory Visit (HOSPITAL_COMMUNITY): Payer: Medicare Other | Attending: Cardiovascular Disease

## 2018-08-27 DIAGNOSIS — I4819 Other persistent atrial fibrillation: Secondary | ICD-10-CM

## 2018-08-27 DIAGNOSIS — I359 Nonrheumatic aortic valve disorder, unspecified: Secondary | ICD-10-CM | POA: Diagnosis present

## 2018-08-27 DIAGNOSIS — I5022 Chronic systolic (congestive) heart failure: Secondary | ICD-10-CM

## 2018-08-27 LAB — BASIC METABOLIC PANEL
BUN/Creatinine Ratio: 17 (ref 12–28)
BUN: 24 mg/dL (ref 8–27)
CO2: 24 mmol/L (ref 20–29)
Calcium: 9.2 mg/dL (ref 8.7–10.3)
Chloride: 104 mmol/L (ref 96–106)
Creatinine, Ser: 1.44 mg/dL — ABNORMAL HIGH (ref 0.57–1.00)
GFR calc Af Amer: 44 mL/min/{1.73_m2} — ABNORMAL LOW (ref >59)
GFR calc non Af Amer: 38 mL/min/{1.73_m2} — ABNORMAL LOW (ref >59)
Glucose: 93 mg/dL (ref 65–99)
Potassium: 4.6 mmol/L (ref 3.5–5.2)
Sodium: 141 mmol/L (ref 134–144)

## 2018-08-30 ENCOUNTER — Telehealth: Payer: Self-pay

## 2018-08-30 MED ORDER — METOPROLOL TARTRATE 25 MG PO TABS: 12.5000 mg | ORAL_TABLET | Freq: Two times a day (BID) | ORAL | 3 refills | Status: DC

## 2018-08-30 NOTE — Telephone Encounter (Signed)
-----   Message from Sherren Mocha, MD sent at 08/30/2018  7:57 AM EDT ----- LV function improving. Stable aortic valve function. Would go ahead and start back on metoprolol at low dose, 12.5 mg BID. This is a good sign and hopefully we will see continued improvement with maintenance of sinus rhythm.

## 2018-08-30 NOTE — Telephone Encounter (Signed)
Reviewed results with patient who verbalized understanding.   Instructed patient to START METOPROLOL 12.5 mg BID. She was grateful for call and agrees with treatment plan.

## 2018-10-01 ENCOUNTER — Telehealth: Payer: Self-pay

## 2018-10-01 NOTE — Telephone Encounter (Signed)
Spoke with pt regarding appt on 10/08/18. Pt stated she will check vitals prior to appt and upload an EKG. Pt questions and concerns were address.

## 2018-10-08 ENCOUNTER — Encounter: Payer: Self-pay | Admitting: Internal Medicine

## 2018-10-08 ENCOUNTER — Telehealth (INDEPENDENT_AMBULATORY_CARE_PROVIDER_SITE_OTHER): Payer: Medicare Other | Admitting: Internal Medicine

## 2018-10-08 VITALS — BP 106/78 | HR 56 | Ht 64.0 in | Wt 148.0 lb

## 2018-10-08 DIAGNOSIS — I4819 Other persistent atrial fibrillation: Secondary | ICD-10-CM

## 2018-10-08 DIAGNOSIS — I428 Other cardiomyopathies: Secondary | ICD-10-CM

## 2018-10-08 DIAGNOSIS — I5022 Chronic systolic (congestive) heart failure: Secondary | ICD-10-CM

## 2018-10-08 NOTE — Progress Notes (Signed)
Electrophysiology TeleHealth Note   Due to national recommendations of social distancing due to COVID 19, an audio/video telehealth visit is felt to be most appropriate for this patient at this time.  See MyChart message from today for the patient's consent to telehealth for [facility].   Date:  10/08/2018   ID:  Wanda [name], DOB [birth year], MRN [id number]  Location: patient's home  Provider location:  [facility]  Evaluation Performed: Follow-up visit  PCP:  [place], [name], MD   Electrophysiologist:  Dr Rayann Heman  Chief Complaint:  AF  History of Present Illness:    Wanda [name] is a 68 y.o. female who presents via telehealth conferencing today.  Since last being seen in our clinic, the patient reports doing very well.  She has been in sinus since may.  She feels "much better".  Today, she denies symptoms of palpitations, chest pain, shortness of breath,  lower extremity edema, dizziness, presyncope, or syncope.  The patient is otherwise without complaint today.  The patient denies symptoms of fevers, chills, cough, or new SOB worrisome for COVID 19.  Past Medical History:  Diagnosis Date  . Anxiety   . Arthritis 2018   hands have general aches  . CHF (congestive heart failure) ([place]) 2018   has shortness of breath since CHF diagnosis and has increased recently; states this is reason for admission  . Chronic systolic heart failure ([place]) 05/25/2016   tachycardia mediated,  resolved previously with sinus rhythm  . Depression   . Dyspnea 2018  . Migraine   . Nonischemic cardiomyopathy (HCC)    tachycardia mediated,  resolved previously with sinus rhythm  . Persistent atrial fibrillation 05/13/2016  . PVC's (premature ventricular contractions) 05/25/2016   Holter 11/17 The basic rhythm is sinus. There are frequent PVCs, periods of ventricular bigeminy, and occasional ventricular couplets and triplets.  . S/P AVR (aortic valve replacement)     Past Surgical History:   Procedure Laterality Date  . AORTIC VALVE REPLACEMENT N/A 05/03/2016   Procedure: AORTIC VALVE REPLACEMENT (AVR) WITH MAGNA EASE PERICARDIAL BIOPROSTHESIS - AORTIC 21 MM MODEL 3300TFX;  Surgeon: Grace Isaac, MD;  Location: [place];  Service: Open Heart Surgery;  Laterality: N/A;  . ATRIAL FIBRILLATION ABLATION N/A 06/28/2018   Procedure: ATRIAL FIBRILLATION ABLATION;  Surgeon: Thompson Grayer, MD;  Location: [place] CV LAB;  Service: Cardiovascular;  Laterality: N/A;  . CARDIOVERSION N/A 02/16/2018   Procedure: CARDIOVERSION;  Surgeon: Dorothy Spark, MD;  Location: [facility] ENDOSCOPY;  Service: Cardiovascular;  Laterality: N/A;  . CARDIOVERSION N/A 08/02/2018   Procedure: CARDIOVERSION;  Surgeon: Skeet Latch, MD;  Location: [facility] ENDOSCOPY;  Service: Cardiovascular;  Laterality: N/A;  . EYE SURGERY  2005   Lasik  . TEE WITHOUT CARDIOVERSION N/A 05/03/2016   Procedure: TRANSESOPHAGEAL ECHOCARDIOGRAM (TEE);  Surgeon: Grace Isaac, MD;  Location: [place];  Service: Open Heart Surgery;  Laterality: N/A;  . TONSILLECTOMY AND ADENOIDECTOMY      Current Outpatient Medications  Medication Sig Dispense Refill  . acetaminophen (TYLENOL) 500 MG tablet Take 500-1,000 mg by mouth every 6 (six) hours as needed (for pain.).    Marland Kitchen amiodarone (PACERONE) 200 MG tablet Take 1 tablet (200 mg total) by mouth 2 (two) times daily.    Marland Kitchen apixaban (ELIQUIS) 5 MG TABS tablet Take 1 tablet (5 mg total) by mouth 2 (two) times daily. 60 tablet 11  . furosemide (LASIX) 20 MG tablet Take 20 mg by mouth daily.    Marland Kitchen  LORazepam (ATIVAN) 0.5 MG tablet Take 0.5 mg by mouth daily as needed for anxiety.     Marland Kitchen MELATONIN PO Take 1 tablet by mouth at bedtime as needed (sleep).    . metoprolol tartrate (LOPRESSOR) 25 MG tablet Take 0.5 tablets (12.5 mg total) by mouth 2 (two) times daily. 90 tablet 3  . spironolactone (ALDACTONE) 25 MG tablet Take 1 tablet (25 mg total) by mouth daily. Take at night 90 tablet 3   No current  facility-administered medications for this visit.     Allergies:   Tikosyn [dofetilide], Codeine, and Vicodin [hydrocodone-acetaminophen]   Social History:  The patient  reports that she has quit smoking. She has never used smokeless tobacco. She reports current alcohol use. She reports that she does not use drugs.   Family History:  The patient's  family history includes Breast cancer (age of onset: 68) in her sister; Congestive Heart Failure in her maternal grandfather; Deep vein thrombosis in her brother, father, paternal aunt, and paternal grandmother; Diabetes in her mother; Heart disease in her brother; Lung cancer in her brother; Ovarian cancer (age of onset: 64) in her maternal grandmother.   ROS:  Please see the history of present illness.   All other systems are personally reviewed and negative.    Exam:    Vital Signs:  BP 106/78   Pulse (!) 56   Ht 5\' 4"  (1.626 m)   Wt 148 lb (67.1 kg)   LMP 08/12/2004   BMI 25.40 kg/m   Well appearing, NAD, normal WOB   Labs/Other Tests and Data Reviewed:    Recent Labs: 06/26/2018: B Natriuretic Peptide 762.2 07/25/2018: Magnesium 2.1; TSH 2.060 07/30/2018: Hemoglobin 15.0; Platelets 221 08/27/2018: BUN 24; Creatinine, Ser 1.44; Potassium 4.6; Sodium 141   Wt Readings from Last 3 Encounters:  10/08/18 148 lb (67.1 kg)  08/24/18 148 lb (67.1 kg)  08/13/18 151 lb (68.5 kg)      ASSESSMENT & PLAN:    1.  Persistent atrial fibrillation Doing much better post ablation Reduce amiodarone to 200mg  daily In 3 months, will likely reduce amiodarone to 100mg  daily Hopefully we can stop amiodarone eventually  2. Nonischemic CM Repeat echo in 3 months   Follow-up:  3 months with me   Patient Risk:  after full review of this patients clinical status, I feel that they are at moderate risk at this time.  Today, I have spent 15 minutes with the patient with telehealth technology discussing arrhythmia management .    Randolm Idol, MD  10/08/2018 10:25 AM     [facility] [address] [place] [address] [phone] (office) [phone] (fax)

## 2018-10-23 ENCOUNTER — Telehealth: Payer: Self-pay

## 2018-10-23 DIAGNOSIS — I5022 Chronic systolic (congestive) heart failure: Secondary | ICD-10-CM

## 2018-10-23 MED ORDER — AMIODARONE HCL 200 MG PO TABS: 200.0000 mg | ORAL_TABLET | Freq: Every day | ORAL | 2 refills | Status: DC

## 2018-10-23 NOTE — Telephone Encounter (Signed)
Sounds good to me, glad she's feeling better. Would repeat labs in early September to follow creatinine. thanks

## 2018-10-23 NOTE — Telephone Encounter (Signed)
-----   Message from Theodoro Parma, RN sent at 09/21/2018  5:50 PM EDT ----- Regarding: 2 mo visit due 10/24/2018 Arrange with her later maybe - she has visits with afib clinic

## 2018-10-23 NOTE — Telephone Encounter (Signed)
Called the patient to arrange follow-up visit with Dr. Burt Knack from June. She had a televisit with Dr. Rayann Heman in July and has an Afib Clinic visit, OV with Dr. Rayann Heman, and echo planned in October. She would like to arrange follow-up with Dr. Burt Knack in December since she feels well, she is staying in a regular rhythm, and her VS are good. Scheduled the patient December 11. She would like to know when Dr. Burt Knack thinks appropriate to recheck her kidney function.  Will send the patient a MyChart message with Dr. Antionette Char lab recommendations.

## 2018-10-23 NOTE — Telephone Encounter (Signed)
MyChart message sent to patient to arrange BMET in September.

## 2018-12-13 ENCOUNTER — Ambulatory Visit (HOSPITAL_COMMUNITY)
Admission: RE | Admit: 2018-12-13 | Discharge: 2018-12-13 | Disposition: A | Discharge status: Home / Self Care | Payer: Medicare Other | Source: Ambulatory Visit | Attending: Physician Assistant | Admitting: Physician Assistant

## 2018-12-13 ENCOUNTER — Ambulatory Visit (HOSPITAL_COMMUNITY): Payer: Medicare Other | Admitting: Physician Assistant

## 2018-12-13 ENCOUNTER — Other Ambulatory Visit: Payer: Self-pay

## 2018-12-13 ENCOUNTER — Encounter (HOSPITAL_COMMUNITY): Payer: Self-pay | Admitting: Nurse Practitioner

## 2018-12-13 VITALS — BP 142/90 | HR 80 | Ht 64.0 in | Wt 156.0 lb

## 2018-12-13 DIAGNOSIS — Z87891 Personal history of nicotine dependence: Secondary | ICD-10-CM | POA: Diagnosis not present

## 2018-12-13 DIAGNOSIS — Z954 Presence of other heart-valve replacement: Secondary | ICD-10-CM | POA: Insufficient documentation

## 2018-12-13 DIAGNOSIS — Z79899 Other long term (current) drug therapy: Secondary | ICD-10-CM | POA: Diagnosis not present

## 2018-12-13 DIAGNOSIS — I5042 Chronic combined systolic (congestive) and diastolic (congestive) heart failure: Secondary | ICD-10-CM | POA: Diagnosis not present

## 2018-12-13 DIAGNOSIS — Z8249 Family history of ischemic heart disease and other diseases of the circulatory system: Secondary | ICD-10-CM | POA: Diagnosis not present

## 2018-12-13 DIAGNOSIS — Z7901 Long term (current) use of anticoagulants: Secondary | ICD-10-CM | POA: Insufficient documentation

## 2018-12-13 DIAGNOSIS — M19041 Primary osteoarthritis, right hand: Secondary | ICD-10-CM | POA: Diagnosis not present

## 2018-12-13 DIAGNOSIS — I4819 Other persistent atrial fibrillation: Secondary | ICD-10-CM | POA: Diagnosis not present

## 2018-12-13 DIAGNOSIS — Z888 Allergy status to other drugs, medicaments and biological substances status: Secondary | ICD-10-CM | POA: Insufficient documentation

## 2018-12-13 DIAGNOSIS — M19042 Primary osteoarthritis, left hand: Secondary | ICD-10-CM | POA: Insufficient documentation

## 2018-12-13 DIAGNOSIS — I4891 Unspecified atrial fibrillation: Secondary | ICD-10-CM | POA: Diagnosis present

## 2018-12-13 DIAGNOSIS — I428 Other cardiomyopathies: Secondary | ICD-10-CM | POA: Diagnosis not present

## 2018-12-13 DIAGNOSIS — Z885 Allergy status to narcotic agent status: Secondary | ICD-10-CM | POA: Diagnosis not present

## 2018-12-13 LAB — COMPREHENSIVE METABOLIC PANEL
ALT: 21 U/L (ref 0–44)
AST: 22 U/L (ref 15–41)
Albumin: 4.3 g/dL (ref 3.5–5.0)
Alkaline Phosphatase: 54 U/L (ref 38–126)
Anion gap: 11 (ref 5–15)
BUN: 23 mg/dL (ref 8–23)
CO2: 23 mmol/L (ref 22–32)
Calcium: 9.4 mg/dL (ref 8.9–10.3)
Chloride: 106 mmol/L (ref 98–111)
Creatinine, Ser: 1.51 mg/dL — ABNORMAL HIGH (ref 0.44–1.00)
GFR calc Af Amer: 41 mL/min — ABNORMAL LOW (ref >60)
GFR calc non Af Amer: 36 mL/min — ABNORMAL LOW (ref >60)
Glucose, Bld: 114 mg/dL — ABNORMAL HIGH (ref 70–99)
Potassium: 3.9 mmol/L (ref 3.5–5.1)
Sodium: 140 mmol/L (ref 135–145)
Total Bilirubin: 0.5 mg/dL (ref 0.3–1.2)
Total Protein: 7.2 g/dL (ref 6.5–8.1)

## 2018-12-13 NOTE — Progress Notes (Signed)
Primary Care Physician: Wanda [name], Wanda G, MD Referring Physician: Dr. Excell [name] Primary EP: Wanda [name] is Wanda 68 y.o. [name] with Wanda h/o  aortic insufficiency, combined systolic and diastolic CHF, non-ischemic dilated CM.She underwent bioprosthetic AVR by Wanda. Wanda [name] in February 2018.  Postoperative course was complicated by atrial fibrillation with Wanda rapid rate requiring IV amiodarone.  Echo in June 2019 demonstrated improved LV function with an EF of 55-60% and mild diastolic   She was seen by Wanda. Wanda [name] 02/01/18 after an admission to the hospital in [facility] with atrial fibrillation with rapid rate complicated by congestive heart failure.  She was started on Apixaban for anticoagulation and diltiazem and metoprolol for rate control.  Echo done in the hospital in [place], [place] demonstrated an EF of 45-50 and Wanda normally functioning aortic valve prosthesis.  Elective cardioversion was arranged after 3 weeks of uninterrupted anticoagulation.  She underwent cardioversion on 02/16/2018 with restoration of normal sinus rhythm (after 4 attempts-120 J, 150 J, 200 J x 2).  She had f/u with Wanda [name], Wanda [name] on 12/20. She felt better for Wanda few days but went back into afib with feeling more fatigued and short of breath.  She is now in the afib clinic, 12/23, to discuss options to restore SR. She does not feel terrible in afib but better in SR. She is dealing with Wanda lot of stress with substance abuse in her son and her concerns for his health. He is currently living with the pt and her husband and going thru detox. She feels this is what made her go back in  afib so quickly. Wanda [name] thought that Tikosyn may be Wanda good option but pt will have to check on cost of drug.  F/u in AF clinic 07/20/18. Patient is s/p AF ablation with Wanda [name] 06/28/18. Recall she failed Tikosyn 2/2 QT prolongation. She feels more heart racing over the last few days with increased SOB and fatigue. She reports that she  becomes SOB even just walking from one room to the next. She admits that she has significant life stressors right now with ill family members and the passing of the family dog. She brings in her Kardia strips which show paroxysms of afib with RVR and also SR. She does have some abdominal fullness and orthopnea which improved with PRN Lasix. No swallowing or groin issues.  F/u in AF clinic 08/13/18. S/p DCCV on 08/02/18. She reports that she feels improved with less SOB and more energy. She states her hands are not as swollen although she does have abdominal fullness at times. She has not taken an extra dose of Lasix. She reports that her Wanda [name] has shown SR ever since her DCCV.  Return to afib clinic 10/1. She was seen last by Wanda. Wanda [name] 3 months ago and is here for f/u. In that note he said to decrease amiodarone to 100 mg daily at this visit and eventually wean off. Pt states Wanda couple of weeks ago she started noticing rainbows areound lights, dizziness, and nausea. 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 She has not had any recurrent afib.  Today, she denies symptoms of palpitations, chest pain, PND, lower extremity edema, dizziness, presyncope, syncope, or neurologic sequela. The patient is tolerating medications without difficulties and is otherwise without complaint today. +nausea  Past Medical History:  Diagnosis Date  .  Anxiety   . Arthritis 2018   hands have general aches  . CHF (congestive heart failure) ([place]) 2018   has shortness of breath since CHF diagnosis and has increased recently; states this is reason for admission  . Chronic systolic heart failure ([place]) 05/25/2016   tachycardia mediated,  resolved previously with sinus rhythm  . Depression   . Dyspnea 2018  . Migraine   . Nonischemic cardiomyopathy (HCC)    tachycardia mediated,  resolved previously with sinus rhythm  .  Persistent atrial fibrillation ([place]) 05/13/2016  . PVC's (premature ventricular contractions) 05/25/2016   Holter 11/17 The basic rhythm is sinus. There are frequent PVCs, periods of ventricular bigeminy, and occasional ventricular couplets and triplets.  . S/P AVR (aortic valve replacement)    Past Surgical History:  Procedure Laterality Date  . AORTIC VALVE REPLACEMENT N/Wanda 05/03/2016   Procedure: AORTIC VALVE REPLACEMENT (AVR) WITH MAGNA EASE PERICARDIAL BIOPROSTHESIS - AORTIC 21 MM MODEL 3300TFX;  Surgeon: Grace Isaac, MD;  Location: [place];  Service: Open Heart Surgery;  Laterality: N/Wanda;  . ATRIAL FIBRILLATION ABLATION N/Wanda 06/28/2018   Procedure: ATRIAL FIBRILLATION ABLATION;  Surgeon: Thompson Grayer, MD;  Location: [place] CV LAB;  Service: Cardiovascular;  Laterality: N/Wanda;  . CARDIOVERSION N/Wanda 02/16/2018   Procedure: CARDIOVERSION;  Surgeon: Dorothy Spark, MD;  Location: [facility] END[facility]COPY;  Service: Cardiovascular;  Laterality: N/Wanda;  . CARDIOVERSION N/Wanda 08/02/2018   Procedure: CARDIOVERSION;  Surgeon: Skeet Latch, MD;  Location: [facility] ENDOSCOPY;  Service: Cardiovascular;  Laterality: N/Wanda;  . EYE SURGERY  2005   Lasik  . TEE WITHOUT CARDIOVERSION N/Wanda 05/03/2016   Procedure: TRANSESOPHAGEAL ECHOCARDIOGRAM (TEE);  Surgeon: Grace Isaac, MD;  Location: [place];  Service: Open Heart Surgery;  Laterality: N/Wanda;  . TONSILLECTOMY AND ADENOIDECTOMY      Current Outpatient Medications  Medication Sig Dispense Refill  . acetaminophen (TYLENOL) 500 MG tablet Take 500-1,000 mg by mouth every 6 (six) hours as needed (for pain.).    Marland Kitchen amiodarone (PACERONE) 200 MG tablet Take 1 tablet (200 mg total) by mouth daily. (Patient taking differently: Take 100 mg by mouth daily. ) 30 tablet 2  . apixaban (ELIQUIS) 5 MG TABS tablet Take 1 tablet (5 mg total) by mouth 2 (two) times daily. 60 tablet 11  . furosemide (LASIX) 20 MG tablet Take 20 mg by mouth daily.    Marland Kitchen LORazepam (ATIVAN) 0.5 MG tablet  Take 0.5 mg by mouth daily as needed for anxiety.     Marland Kitchen MELATONIN PO Take 1 tablet by mouth at bedtime as needed (sleep).    . metoprolol tartrate (LOPRESSOR) 25 MG tablet Take 0.5 tablets (12.5 mg total) by mouth 2 (two) times daily. 90 tablet 3  . spironolactone (ALDACTONE) 25 MG tablet Take 1 tablet (25 mg total) by mouth daily. Take at night 90 tablet 3   No current facility-administered medications for this encounter.     Allergies  Allergen Reactions  . Tikosyn [Dofetilide] Other (See Comments)    Prolonged QT, nonsustained torsades at low dose  . Codeine Nausea And Vomiting  . Vicodin [Hydrocodone-Acetaminophen] Nausea And Vomiting    Social History   Socioeconomic History  . Marital status: Married    Spouse name: Not on file  . Number of children: Not on file  . Years of education: Not on file  . Highest education level: Not on file  Occupational History  . Not on file  Social Needs  . Financial resource strain:  Not on file  . Food insecurity    Worry: Not on file    Inability: Not on file  . Transportation needs    Medical: Not on file    Non-medical: Not on file  Tobacco Use  . Smoking status: Former Games developermoker  . Smokeless tobacco: Never Used  . Tobacco comment: stopped in 70's while in college; smoked 1-2 years  Substance and Sexual Activity  . Alcohol use: Yes    Comment: 1-2 glasses of wine; hasn't had wine for last month  . Drug use: No  . Sexual activity: Yes    Partners: Male    Birth control/protection: Other-see comments, Post-menopausal    Comment: vasectomy  Lifestyle  . Physical activity    Days per week: Not on file    Minutes per session: Not on file  . Stress: Not on file  Relationships  . Social Musicianconnections    Talks on phone: Not on file    Gets together: Not on file    Attends religious service: Not on file    Active member of club or organization: Not on file    Attends meetings of clubs or organizations: Not on file    Relationship  status: Not on file  . Intimate partner violence    Fear of current or ex partner: Not on file    Emotionally abused: Not on file    Physically abused: Not on file    Forced sexual activity: Not on file  Other Topics Concern  . Not on file  Social History Narrative   [place]   [place]    Family History  Problem Relation Age of Onset  . Diabetes Mother   . Ovarian cancer Maternal Grandmother 80  . Breast cancer Sister 1848  . Congestive Heart Failure Maternal Grandfather   . Heart disease Brother        valve replacement  . Deep vein thrombosis Brother   . Lung cancer Brother   . Deep vein thrombosis Father   . Deep vein thrombosis Paternal Aunt        x2  . Deep vein thrombosis Paternal Grandmother     ROS- All systems are reviewed and negative except as per the HPI above  Physical Exam: Vitals:   12/13/18 1156  BP: (!) 142/90  Pulse: 80  Weight: 70.8 kg  Height: 5\' 4"  (1.626 m)   Wt Readings from Last 3 Encounters:  12/13/18 70.8 kg  10/08/18 67.1 kg  08/24/18 67.1 kg    Labs: Lab Results  Component Value Date   NA 140 12/13/2018   K 3.9 12/13/2018   CL 106 12/13/2018   CO2 23 12/13/2018   GLUCOSE 114 (H) 12/13/2018   BUN 23 12/13/2018   CREATININE 1.51 (H) 12/13/2018   CALCIUM 9.4 12/13/2018   MG 2.1 07/25/2018   Lab Results  Component Value Date   INR 1.8 07/07/2016   Lab Results  Component Value Date   CHOL 247 (H) 08/18/2015   HDL 108 08/18/2015   LDLCALC 124 08/18/2015   TRIG 73 08/18/2015    GEN- The patient is well appearing, alert and oriented x 3 today.   HEENT-head normocephalic, atraumatic, sclera clear, conjunctiva pink, hearing intact, trachea midline. Lungs- Clear to ausculation bilaterally, normal work of breathing Heart- Regular rate and rhythm, no murmurs, rubs or gallops  GI- soft, NT, ND, + BS Extremities- no clubbing, cyanosis, or edema MS- no significant deformity or atrophy Skin- no rash  or lesion Psych-  euthymic mood, full affect Neuro- strength and sensation are intact   EKG- NSR at 80 bpm, pr int 196 ms, qrs int 130 ms, qtc 519 ms  Other studies personally reviewed: All recent Epic documents/notes/ labs reviewed   Echo 06/26/18 1. The left ventricle has severely reduced systolic function, with an ejection fraction of 20-25%. The cavity size was normal. There is mildly increased left ventricular wall thickness. Left ventricular diastolic function could not be evaluated  secondary to atrial fibrillation. Left ventricular diffuse hypokinesis.  2. Left atrial size was moderately dilated.  3. Right atrial size was moderately dilated.  4. The mitral valve is grossly normal. Mild thickening of the mitral valve leaflet. Mitral valve regurgitation is mild to moderate by color flow Doppler.  5. The tricuspid valve is grossly normal.  6. Aortic valve regurgitation is trivial by color flow Doppler.  7. Severe global reduction in LV systolic function; mild LVH; moderate biatrial elargement; s/p AVR with trace AI; mild to moderate MR.  Epic notes reviewed.  Assessment and Plan: 1. Paroxysmal atrial fibrillation S/p ablation with Wanda [name] 06/28/18 with ERAF. Was loaded on amiodarone and repeat  DCCV 08/02/18 successful.  Has been  maintaining SR  Recently had symptoms of vision change, nausea, dizziness Saw an ophthalmologist and had corneal deposits thought 2/2 amio Pt herself reduced dose to 100 mg daily for the last 6-7 days  as she was  anticipating  the reduction in dose  at this visit  Discussed with Wanda. Wanda [name] and he advised to just stop amiodarone  The nausea should abate with stopping amio Continue Eliquis 5 mg BID  This patients CHA2DS2-VASc Score and unadjusted Ischemic Stroke Rate (% per year) is equal to 3.2 % stroke rate/year from Wanda score of 3  2. Chronic combined systolic and diastolic CHF EF 07-37%. Likely related to AF. Weight stable. No overt signs of fluid overload today.  Continue spironolactone 25 mg daily.  Contiue metoprolol 12.5 mg bid Lasix 20 mg  daily 2 gram sodium diet. Cmet per  Wanda. Wanda [name]    Follow up with  Wanda [name] as scheduled 10/29  Elvina Sidle. [facility] [address] [place], [place] [address] [phone]

## 2019-01-09 ENCOUNTER — Telehealth: Payer: Self-pay

## 2019-01-09 ENCOUNTER — Telehealth (INDEPENDENT_AMBULATORY_CARE_PROVIDER_SITE_OTHER): Payer: Medicare Other | Admitting: Internal Medicine

## 2019-01-09 VITALS — BP 115/85 | HR 78 | Ht 64.0 in | Wt 155.0 lb

## 2019-01-09 DIAGNOSIS — I428 Other cardiomyopathies: Secondary | ICD-10-CM | POA: Diagnosis not present

## 2019-01-09 DIAGNOSIS — I4819 Other persistent atrial fibrillation: Secondary | ICD-10-CM

## 2019-01-09 DIAGNOSIS — I5022 Chronic systolic (congestive) heart failure: Secondary | ICD-10-CM

## 2019-01-09 NOTE — Telephone Encounter (Signed)
Bmet, liver profile, tsh, free t4  Please obtain when she gets the echo.

## 2019-01-09 NOTE — Telephone Encounter (Signed)
-----   Message from Thompson Grayer, MD sent at 01/09/2019 10:16 AM EDT ----- Please order an echo for next available time  Follow-up with me in 3 months

## 2019-01-09 NOTE — Telephone Encounter (Signed)
Orders entered.  Will schedule labs same day as ECHO.  F/u appt made.

## 2019-01-09 NOTE — Progress Notes (Signed)
Electrophysiology TeleHealth Note   Due to national recommendations of social distancing due to COVID 19, an audio/video telehealth visit is felt to be most appropriate for this patient at this time.  See MyChart message from today for the patient's consent to telehealth for [name].  Date:  01/09/2019   ID:  Wanda [name], DOB 02-15-1951, MRN [id number]  Location: patient's home  Provider location:  [facility]  Evaluation Performed: Follow-up visit  PCP:  [place], [name], MD   Electrophysiologist:  Dr Johney Frame  Chief Complaint:  palpitations  History of Present Illness:    Wanda [name] is a 68 y.o. female who presents via telehealth conferencing today.  Since last being seen in our clinic, the patient reports doing very well.  She has had no afib.  She has had some halo vision at night for which she saw opthalmology.  She had corneal deposits with amiodarone.  We have been able to take her off of amiodarone subsequently.  She has gained weight.  She has found significant improvement in her SOB.   Today, she denies symptoms of palpitations, chest pain,  lower extremity edema, dizziness, presyncope, or syncope.  The patient is otherwise without complaint today.    Past Medical History:  Diagnosis Date  . Anxiety   . Arthritis 2018   hands have general aches  . CHF (congestive heart failure) (HCC) 2018   has shortness of breath since CHF diagnosis and has increased recently; states this is reason for admission  . Chronic systolic heart failure (HCC) 05/25/2016   tachycardia mediated,  resolved previously with sinus rhythm  . Depression   . Dyspnea 2018  . Migraine   . Nonischemic cardiomyopathy (HCC)    tachycardia mediated,  resolved previously with sinus rhythm  . Persistent atrial fibrillation (HCC) 05/13/2016  . PVC's (premature ventricular contractions) 05/25/2016   Holter 11/17 The basic rhythm is sinus. There are frequent PVCs, periods of ventricular bigeminy,  and occasional ventricular couplets and triplets.  . S/P AVR (aortic valve replacement)     Past Surgical History:  Procedure Laterality Date  . AORTIC VALVE REPLACEMENT N/A 05/03/2016   Procedure: AORTIC VALVE REPLACEMENT (AVR) WITH MAGNA EASE PERICARDIAL BIOPROSTHESIS - AORTIC 21 MM MODEL 3300TFX;  Surgeon: [name], MD;  Location: [facility] OR;  Service: Open Heart Surgery;  Laterality: N/A;  . ATRIAL FIBRILLATION ABLATION N/A 06/28/2018   Procedure: ATRIAL FIBRILLATION ABLATION;  Surgeon: Hillis Range, MD;  Location: [facility] INVASIVE CV LAB;  Service: Cardiovascular;  Laterality: N/A;  . CARDIOVERSION N/A 02/16/2018   Procedure: CARDIOVERSION;  Surgeon: Lars Masson, MD;  Location: [name];  Service: Cardiovascular;  Laterality: N/A;  . CARDIOVERSION N/A 08/02/2018   Procedure: CARDIOVERSION;  Surgeon: Chilton Si, MD;  Location: [name];  Service: Cardiovascular;  Laterality: N/A;  . EYE SURGERY  2005   Lasik  . TEE WITHOUT CARDIOVERSION N/A 05/03/2016   Procedure: TRANSESOPHAGEAL ECHOCARDIOGRAM (TEE);  Surgeon: [name], MD;  Location: [facility] OR;  Service: Open Heart Surgery;  Laterality: N/A;  . TONSILLECTOMY AND ADENOIDECTOMY      Current Outpatient Medications  Medication Sig Dispense Refill  . acetaminophen (TYLENOL) 500 MG tablet Take 500-1,000 mg by mouth every 6 (six) hours as needed (for pain.).    Marland Kitchen apixaban (ELIQUIS) 5 MG TABS tablet Take 1 tablet (5 mg total) by mouth 2 (two) times daily. 60 tablet 11  . furosemide (LASIX) 20 MG tablet Take 20 mg  by mouth daily.    Marland Kitchen LORazepam (ATIVAN) 0.5 MG tablet Take 0.5 mg by mouth daily as needed for anxiety.     Marland Kitchen MELATONIN PO Take 1 tablet by mouth at bedtime as needed (sleep).    . metoprolol tartrate (LOPRESSOR) 25 MG tablet Take 0.5 tablets (12.5 mg total) by mouth 2 (two) times daily. 90 tablet 3  . spironolactone (ALDACTONE) 25 MG tablet Take 1 tablet (25 mg total) by mouth daily. Take at night 90 tablet  3   No current facility-administered medications for this visit.     Allergies:   Tikosyn [dofetilide], Codeine, and Vicodin [hydrocodone-acetaminophen]   Social History:  The patient  reports that she has quit smoking. She has never used smokeless tobacco. She reports current alcohol use. She reports that she does not use drugs.   Family History:  The patient's family history includes Breast cancer (age of onset: 59) in her sister; Congestive Heart Failure in her maternal grandfather; Deep vein thrombosis in her brother, father, paternal aunt, and paternal grandmother; Diabetes in her mother; Heart disease in her brother; Lung cancer in her brother; Ovarian cancer (age of onset: 85) in her maternal grandmother.   ROS:  Please see the history of present illness.   All other systems are personally reviewed and negative.    Exam:    Vital Signs:  BP 115/85   Pulse 78   Ht 5\' 4"  (1.626 m)   Wt 155 Wanda (70.3 kg)   LMP 08/12/2004   BMI 26.61 kg/m   Well sounding and appearing, alert and conversant, regular work of breathing,  good skin color Eyes- anicteric, neuro- grossly intact, skin- no apparent rash or lesions or cyanosis, mouth- oral mucosa is pink  Labs/Other Tests and Data Reviewed:    Recent Labs: 06/26/2018: B Natriuretic Peptide 762.2 07/25/2018: Magnesium 2.1; TSH 2.060 07/30/2018: Hemoglobin 15.0; Platelets 221 12/13/2018: ALT 21; BUN 23; Creatinine, Ser 1.51; Potassium 3.9; Sodium 140   Wt Readings from Last 3 Encounters:  01/09/19 155 Wanda (70.3 kg)  12/13/18 156 Wanda (70.8 kg)  10/08/18 148 Wanda (67.1 kg)      ASSESSMENT & PLAN:    1.  Persistent afib Clinically improved post ablation She is now off amiodarone  Continue anticoagulation with eliquis Check tfts, LFTs If she has further afib, we will need to consider repeat ablation or tikosyn.  2. Chronic systolic dysfunction/ nonischemic CM She had cardiogenic shock and was very very ill in April from her afib.  She  has improved substantially. Repeat echo at this time. If EF remains depressed we should consider ARB vs entresto.  Previously we could not start this due to hypotension.  Hypotension has resolved with remarkable clinical improvement. If her EF has normalized, perhaps we could stop spironolactone or lasix.  3. Chronic renal failure As above, if her EF has normalized, perhaps we could stop spironolactone and eventually lasix bmet   Follow-up:  3 months with me She will see Dr Burt Knack in December (overdue to see him)   Patient Risk:  after full review of this patients clinical status, I feel that they are at moderate risk at this time.  Today, I have spent 15 minutes with the patient with telehealth technology discussing arrhythmia management .    Army Fossa, MD  01/09/2019 10:10 AM     [name] [address] [place] [address] [phone] (office) [phone] (fax)

## 2019-01-11 ENCOUNTER — Ambulatory Visit (HOSPITAL_COMMUNITY): Payer: Medicare Other | Attending: Cardiology

## 2019-01-11 ENCOUNTER — Other Ambulatory Visit: Payer: Self-pay

## 2019-01-11 ENCOUNTER — Other Ambulatory Visit: Payer: Medicare Other | Admitting: *Deleted

## 2019-01-11 DIAGNOSIS — I428 Other cardiomyopathies: Secondary | ICD-10-CM | POA: Diagnosis present

## 2019-01-11 DIAGNOSIS — I4819 Other persistent atrial fibrillation: Secondary | ICD-10-CM

## 2019-01-12 LAB — BASIC METABOLIC PANEL
BUN/Creatinine Ratio: 13 (ref 12–28)
BUN: 19 mg/dL (ref 8–27)
CO2: 22 mmol/L (ref 20–29)
Calcium: 9.5 mg/dL (ref 8.7–10.3)
Chloride: 103 mmol/L (ref 96–106)
Creatinine, Ser: 1.49 mg/dL — ABNORMAL HIGH (ref 0.57–1.00)
GFR calc Af Amer: 42 mL/min/{1.73_m2} — ABNORMAL LOW (ref >59)
GFR calc non Af Amer: 36 mL/min/{1.73_m2} — ABNORMAL LOW (ref >59)
Glucose: 109 mg/dL — ABNORMAL HIGH (ref 65–99)
Potassium: 3.9 mmol/L (ref 3.5–5.2)
Sodium: 141 mmol/L (ref 134–144)

## 2019-01-12 LAB — HEPATIC FUNCTION PANEL
ALT: 16 IU/L (ref 0–32)
AST: 18 IU/L (ref 0–40)
Albumin: 4.8 g/dL (ref 3.8–4.8)
Alkaline Phosphatase: 59 IU/L (ref 39–117)
Bilirubin Total: 0.4 mg/dL (ref 0.0–1.2)
Bilirubin, Direct: 0.11 mg/dL (ref 0.00–0.40)
Total Protein: 6.9 g/dL (ref 6.0–8.5)

## 2019-01-12 LAB — T4, FREE: Free T4: 1.55 ng/dL (ref 0.82–1.77)

## 2019-01-12 LAB — TSH: TSH: 1.21 u[IU]/mL (ref 0.450–4.500)

## 2019-02-22 ENCOUNTER — Ambulatory Visit: Payer: Medicare Other | Admitting: Cardiovascular Disease

## 2019-02-25 ENCOUNTER — Ambulatory Visit: Payer: Medicare Other | Admitting: Cardiovascular Disease

## 2019-02-25 ENCOUNTER — Other Ambulatory Visit: Payer: Self-pay

## 2019-02-25 ENCOUNTER — Encounter: Payer: Self-pay | Admitting: Cardiovascular Disease

## 2019-02-25 VITALS — BP 142/86 | HR 75 | Ht 64.0 in | Wt 159.0 lb

## 2019-02-25 DIAGNOSIS — I428 Other cardiomyopathies: Secondary | ICD-10-CM

## 2019-02-25 DIAGNOSIS — I5022 Chronic systolic (congestive) heart failure: Secondary | ICD-10-CM | POA: Diagnosis not present

## 2019-02-25 DIAGNOSIS — I4819 Other persistent atrial fibrillation: Secondary | ICD-10-CM

## 2019-02-25 DIAGNOSIS — I359 Nonrheumatic aortic valve disorder, unspecified: Secondary | ICD-10-CM | POA: Diagnosis not present

## 2019-02-25 NOTE — Progress Notes (Signed)
Cardiology Office Note:    Date:  02/26/2019   ID:  NICKCOLE BRALLEY, DOB 31-Dec-1950, MRN [id number]  PCP:  [place], [name], MD  Cardiologist:  Sherren Mocha, MD  Electrophysiologist:  None   Referring MD: Martinique, [name], MD   Chief Complaint  Patient presents with  . Shortness of Breath    History of Present Illness:    Wanda [name] is a 68 y.o. female with a hx of aortic insufficiency, chronic combined systolic and diastolic heart failure, persistent atrial fibrillation, and nonischemic dilated cardiomyopathy, presenting for follow-up evaluation.  The patient underwent bioprosthetic aortic valve replacement in 2018.  She was initially noted to have moderate LV systolic dysfunction, but had improvement in her LVEF postoperatively.  She developed atrial fibrillation in 2019 when she was [place] in [place].  This was complicated by the presence of congestive heart failure and reduced LV function.  She was started on apixaban for anticoagulation and was initially treated with a rate controlling strategy.  She then underwent cardioversion in December 2019 with restoration of sinus rhythm.  She went back into atrial fibrillation and delayed treatment because of the COVID-19 pandemic.  She ultimately developed severe low output heart failure and was hospitalized in April 2020.  She was started on dofetilide and underwent ablation by Dr. Rayann Heman.  Dofetilide was discontinued post procedure because of marked QT prolongation.  She required another cardioversion in May 2020 and was treated with amiodarone.  She was followed closely in the A. 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  The patient is here alone today.  She is doing pretty well.  She is holding her beta-blocker at times because of dizziness.  She runs a fairly low blood pressure at home.  Her breathing is improved and she is able to  walk on flat ground without symptoms.  She has not had any chest pain or pressure.  She has not had any recent palpitations.  She checks her heart rate frequently and she has been in sinus rhythm based on her readings.  She uses a Chiropodist.  Past Medical History:  Diagnosis Date  . Anxiety   . Arthritis 2018   hands have general aches  . CHF (congestive heart failure) ([place]) 2018   has shortness of breath since CHF diagnosis and has increased recently; states this is reason for admission  . Chronic systolic heart failure ([place]) 05/25/2016   tachycardia mediated,  resolved previously with sinus rhythm  . Depression   . Dyspnea 2018  . Migraine   . Nonischemic cardiomyopathy (H[facility])    tachycardia mediated,  resolved previously with sinus rhythm  . Persistent atrial fibrillation ([place]) 05/13/2016  . PVC's (premature ventricular contractions) 05/25/2016   Holter 11/17 The basic rhythm is sinus. There are frequent PVCs, periods of ventricular bigeminy, and occasional ventricular couplets and triplets.  . S/P AVR (aortic valve replacement)     Past Surgical History:  Procedure Laterality Date  . AORTIC VALVE REPLACEMENT N/A 05/03/2016   Procedure: AORTIC VALVE REPLACEMENT (AVR) WITH MAGNA EASE PERICARDIAL BIOPROSTHESIS - AORTIC 21 MM MODEL 3300TFX;  Surgeon: Grace Isaac, MD;  Location: [facility];  Service: Open Heart Surgery;  Laterality: N/A;  . ATRIAL FIBRILLATION ABLATION N/A 06/28/2018   Procedure: ATRIAL FIBRILLATION ABLATION;  Surgeon: Thompson Grayer, MD;  Location: [facility];  Service: Cardiovascular;  Laterality: N/A;  . CARDIOVERSION  N/A 02/16/2018   Procedure: CARDIOVERSION;  Surgeon: Dorothy Spark, MD;  Location: [facility] ENDOSCOPY;  Service: Cardiovascular;  Laterality: N/A;  . CARDIOVERSION N/A 08/02/2018   Procedure: CARDIOVERSION;  Surgeon: Skeet Latch, MD;  Location: [facility] ENDOSCOPY;  Service: Cardiovascular;  Laterality: N/A;  . EYE SURGERY  2005   Lasik  . TEE  WITHOUT CARDIOVERSION N/A 05/03/2016   Procedure: TRANSESOPHAGEAL ECHOCARDIOGRAM (TEE);  Surgeon: Grace Isaac, MD;  Location: [facility];  Service: Open Heart Surgery;  Laterality: N/A;  . TONSILLECTOMY AND ADENOIDECTOMY      Current Medications: Current Meds  Medication Sig  . acetaminophen (TYLENOL) 500 MG tablet Take 500-1,000 mg by mouth every 6 (six) hours as needed (for pain.).  Marland Kitchen apixaban (ELIQUIS) 5 MG TABS tablet Take 1 tablet (5 mg total) by mouth 2 (two) times daily.  . furosemide (LASIX) 20 MG tablet Take 20 mg by mouth daily.  Marland Kitchen LORazepam (ATIVAN) 0.5 MG tablet Take 0.5 mg by mouth daily as needed for anxiety.   Marland Kitchen MELATONIN PO Take 1 tablet by mouth at bedtime as needed (sleep).  . metoprolol tartrate (LOPRESSOR) 25 MG tablet Take 0.5 tablets (12.5 mg total) by mouth 2 (two) times daily.  Marland Kitchen spironolactone (ALDACTONE) 25 MG tablet Take 1 tablet (25 mg total) by mouth daily. Take at night     Allergies:   Tikosyn [dofetilide], Codeine, and Vicodin [hydrocodone-acetaminophen]   Social History   Socioeconomic History  . Marital status: Married    Spouse name: Not on file  . Number of children: Not on file  . Years of education: Not on file  . Highest education level: Not on file  Occupational History  . Not on file  Tobacco Use  . Smoking status: Former Research scientist (life sciences)  . Smokeless tobacco: Never Used  . Tobacco comment: stopped in 70's while in college; smoked 1-2 years  Substance and Sexual Activity  . Alcohol use: Yes    Comment: 1-2 glasses of wine; hasn't had wine for last month  . Drug use: No  . Sexual activity: Yes    Partners: Male    Birth control/protection: Other-see comments, Post-menopausal    Comment: vasectomy  Other Topics Concern  . Not on file  Social History Narrative   Lives in [place]   retired   Social Determinants of [place] Strain:   . Difficulty of Paying Living Expenses: Not on file  Food Insecurity:   . Worried  About Charity fundraiser in the Last Year: Not on file  . Ran Out of Food in the Last Year: Not on file  Transportation Needs:   . Lack of Transportation (Medical): Not on file  . Lack of Transportation (Non-Medical): Not on file  Physical Activity:   . Days of Exercise per Week: Not on file  . Minutes of Exercise per Session: Not on file  Stress:   . Feeling of Stress : Not on file  Social Connections:   . Frequency of Communication with Friends and Family: Not on file  . Frequency of Social Gatherings with Friends and Family: Not on file  . Attends Religious Services: Not on file  . Active Member of Clubs or Organizations: Not on file  . Attends Archivist Meetings: Not on file  . Marital Status: Not on file     Family History: The patient's family history includes Breast cancer (age of onset: 9) in her sister; Congestive Heart Failure in her maternal grandfather;  Deep vein thrombosis in her brother, father, paternal aunt, and paternal grandmother; Diabetes in her mother; Heart disease in her brother; Lung cancer in her brother; Ovarian cancer (age of onset: 2) in her maternal grandmother.  ROS:   Please see the history of present illness.    All other systems reviewed and are negative.  EKGs/Labs/Other Studies Reviewed:    The following studies were reviewed today: Echo 01-11-2019: IMPRESSIONS    1. Left ventricular ejection fraction, by visual estimation, is 40 to 45%. The left ventricle has mildly decreased function. There is no left ventricular hypertrophy.  2. Abnormal septal motion consistent with left bundle branch block.  3. Left ventricular diastolic parameters are consistent with Grade I diastolic dysfunction (impaired relaxation).  4. The left ventricle demonstrates global hypokinesis.  5. Global right ventricle has normal systolic function.The right ventricular size is normal. No increase in right ventricular wall thickness.  6. Left atrial size was  normal.  7. Right atrial size was normal.  8. The mitral valve is normal in structure. Mild to moderate mitral valve regurgitation.  9. The tricuspid valve is normal in structure. Tricuspid valve regurgitation is mild. 10. Aortic valve regurgitation is not visualized. Moderate aortic valve stenosis. 11. The pulmonic valve was grossly normal. Pulmonic valve regurgitation is not visualized. 12. The aortic root was not well visualized. 13. There is mild dilatation of the ascending aorta measuring 37 mm. 14. Normal pulmonary artery systolic pressure. 15. There is further improvement in LV systolic function compared to 08/27/2018. Aortic valve prosthesis gradients have increased due to improved LV contractility. The dimensionless obstructive index is unchanged.  In comparison to the previous echocardiogram(s): 08/27/18 EF 35-40%. AV 88mHg mean, 343mg peak. FINDINGS  Left Ventricle: Left ventricular ejection fraction, by visual estimation, is 40 to 45%. The left ventricle has mildly decreased function. The left ventricle demonstrates global hypokinesis. There is no left ventricular hypertrophy. Abnormal  (paradoxical) septal motion, consistent with left bundle branch block. Left ventricular diastolic parameters are consistent with Grade I diastolic dysfunction (impaired relaxation).  Right Ventricle: The right ventricular size is normal. No increase in right ventricular wall thickness. Global RV systolic function is has normal systolic function. The tricuspid regurgitant velocity is 2.03 m/s, and with an assumed right atrial pressure  of 3 mmHg, the estimated right ventricular systolic pressure is normal at 19.5 mmHg.  Left Atrium: Left atrial size was normal in size.  Right Atrium: Right atrial size was normal in size  Pericardium: There is no evidence of pericardial effusion.  Mitral Valve: The mitral valve is normal in structure. Mild to moderate mitral valve regurgitation, with  centrally-directed jet.  Tricuspid Valve: The tricuspid valve is normal in structure. Tricuspid valve regurgitation is mild.  Aortic Valve: The aortic valve has been repaired/replaced. Aortic valve regurgitation is not visualized. Moderate aortic stenosis is present. Aortic valve mean gradient measures 22.0 mmHg. Aortic valve peak gradient measures 39.3 mmHg. 2134mdwards  bioprosthetic aortic valve valve is present in the aortic position. Procedure Date: 05/03/16.  Pulmonic Valve: The pulmonic valve was grossly normal. Pulmonic valve regurgitation is not visualized.  Aorta: The aortic root was not well visualized. There is mild dilatation of the ascending aorta measuring 37 mm.  IAS/Shunts: No atrial level shunt detected by color flow Doppler.  EKG:  EKG is not ordered today.   Recent Labs: 06/26/2018: B Natriuretic Peptide 762.2 07/25/2018: Magnesium 2.1 07/30/2018: Hemoglobin 15.0; Platelets 221 01/11/2019: ALT 16; BUN 19; Creatinine, Ser 1.49; Potassium  3.9; Sodium 141; TSH 1.210  Recent Lipid Panel    Component Value Date/Time   CHOL 247 (H) 08/18/2015 1600   TRIG 73 08/18/2015 1600   HDL 108 08/18/2015 1600   CHOLHDL 2.3 08/18/2015 1600   VLDL 15 08/18/2015 1600   LDLCALC 124 08/18/2015 1600    Physical Exam:    VS:  BP (!) 142/86   Pulse 75   Ht 5' 4"  (1.626 m)   Wt 159 lb (72.1 kg)   LMP 08/12/2004   SpO2 97%   BMI 27.29 kg/m     Wt Readings from Last 3 Encounters:  02/25/19 159 lb (72.1 kg)  01/09/19 155 lb (70.3 kg)  12/13/18 156 lb (70.8 kg)     GEN:  Well nourished, well developed in no acute distress HEENT: Normal NECK: No JVD; No carotid bruits LYMPHATICS: No lymphadenopathy CARDIAC: RRR, 2/6 early peaking systolic ejection murmur at the right upper sternal border RESPIRATORY:  Clear to auscultation without rales, wheezing or rhonchi  ABDOMEN: Soft, non-tender, non-distended MUSCULOSKELETAL:  No edema; No deformity  SKIN: Warm and dry  NEUROLOGIC:  Alert and oriented x 3 PSYCHIATRIC:  Normal affect   ASSESSMENT:    1. Nonischemic cardiomyopathy ([place])   2. Chronic systolic heart failure (HCC)   3. Persistent atrial fibrillation ([place])   4. Aortic valve disease    PLAN:    In order of problems listed above:  1. LVEF stable on recent echo study which is reviewed today.  EF improved from 35 to 40% range now up to 40 to 45% range.  She will continue current therapy with dose limited by side effects/low blood pressure.  [place] Heart Association functional class II symptoms. 2. As above, continue metoprolol succinate, low-dose furosemide, spironolactone. 3. Now off of amiodarone secondary to ocular deposits and vision changes.  Maintaining sinus rhythm.  Treated with apixaban.  Followed closely in the atrial fib clinic and also by Dr. Rayann Heman. 4. Mildly elevated gradients are stable, 22 mm mean gradient on most recent echo study.   Medication Adjustments/Labs and Tests Ordered: Current medicines are reviewed at length with the patient today.  Concerns regarding medicines are outlined above.  Orders Placed This Encounter  Procedures  . Comp Met (CMET)   No orders of the defined types were placed in this encounter.   Patient Instructions  Medication Instructions:  Your provider recommends that you continue on your current medications as directed. Please refer to the Current Medication list given to you today.   *If you need a refill on your cardiac medications before your next appointment, please call your pharmacy*  Follow-Up: At [facility], you and your health needs are our priority.  As part of our continuing mission to provide you with exceptional heart care, we have created designated Provider Care Teams.  These Care Teams include your primary Cardiologist (physician) and Advanced Practice Providers (APPs -  Physician Assistants and Nurse Practitioners) who all work together to provide you with the care you  need, when you need it. Your next appointment:   6 month(s) The format for your next appointment:   In Person Provider:   You may see Sherren Mocha, MD or one of the following Advanced Practice Providers on your designated Care Team:    Richardson Dopp, PA-C  Vin Cecilton, PA-C  Daune Perch, [place]    Signed, Sherren Mocha, MD  02/26/2019 9:04 AM    [place]

## 2019-02-25 NOTE — Patient Instructions (Signed)
Medication Instructions:  Your provider recommends that you continue on your current medications as directed. Please refer to the Current Medication list given to you today.   *If you need a refill on your cardiac medications before your next appointment, please call your pharmacy*   Follow-Up: At [facility], you and your health needs are our priority.  As part of our continuing mission to provide you with exceptional heart care, we have created designated Provider Care Teams.  These Care Teams include your primary Cardiologist (physician) and Advanced Practice Providers (APPs -  Physician Assistants and Nurse Practitioners) who all work together to provide you with the care you need, when you need it. Your next appointment:   6 month(s) The format for your next appointment:   In Person Provider:   You may see Michael Cooper, MD or one of the following Advanced Practice Providers on your designated Care Team:    [name] Weaver, PA-C  Vin Bhagat, PA-C  Janine Hammond, NP   

## 2019-02-28 ENCOUNTER — Other Ambulatory Visit: Payer: Self-pay | Admitting: Cardiovascular Disease

## 2019-04-08 ENCOUNTER — Telehealth (INDEPENDENT_AMBULATORY_CARE_PROVIDER_SITE_OTHER): Payer: Medicare PPO | Admitting: Internal Medicine

## 2019-04-08 VITALS — BP 122/86 | HR 72 | Wt 154.0 lb

## 2019-04-08 DIAGNOSIS — I5022 Chronic systolic (congestive) heart failure: Secondary | ICD-10-CM

## 2019-04-08 DIAGNOSIS — I4819 Other persistent atrial fibrillation: Secondary | ICD-10-CM

## 2019-04-08 DIAGNOSIS — I428 Other cardiomyopathies: Secondary | ICD-10-CM

## 2019-04-08 DIAGNOSIS — D6869 Other thrombophilia: Secondary | ICD-10-CM

## 2019-04-08 NOTE — Progress Notes (Signed)
Electrophysiology TeleHealth Note   Due to national recommendations of social distancing due to COVID 19, an audio/video telehealth visit is felt to be most appropriate for this patient at this time.  See MyChart message from today for the patient's consent to telehealth for [facility]  Date:  04/08/2019   ID:  Wanda [name], DOB 10/09/1950, MRN [id number]  Location: patient's home  Provider location:  [facility]  Evaluation Performed: Follow-up visit  PCP:  [place], [name], MD   Electrophysiologist:  Dr Johney Frame  Chief Complaint:  palpitations  History of Present Illness:    Labrittany [name] Wanda [name] is a 69 y.o. female who presents via telehealth conferencing today.  Since last being seen in our clinic, the patient reports doing very well.  Today, she denies symptoms of palpitations, chest pain, shortness of breath,  lower extremity edema, dizziness, presyncope, or syncope.  The patient is otherwise without complaint today.  The patient denies symptoms of fevers, chills, cough, or new SOB worrisome for COVID 19.  Past Medical History:  Diagnosis Date  . Anxiety   . Arthritis 2018   hands have general aches  . CHF (congestive heart failure) (HCC) 2018   has shortness of breath since CHF diagnosis and has increased recently; states this is reason for admission  . Chronic systolic heart failure (HCC) 05/25/2016   tachycardia mediated,  resolved previously with sinus rhythm  . Depression   . Dyspnea 2018  . Migraine   . Nonischemic cardiomyopathy (HCC)    tachycardia mediated,  resolved previously with sinus rhythm  . Persistent atrial fibrillation (HCC) 05/13/2016  . PVC's (premature ventricular contractions) 05/25/2016   Holter 11/17 The basic rhythm is sinus. There are frequent PVCs, periods of ventricular bigeminy, and occasional ventricular couplets and triplets.  . S/P AVR (aortic valve replacement)     Past Surgical History:  Procedure Laterality Date  . AORTIC VALVE  REPLACEMENT N/A 05/03/2016   Procedure: AORTIC VALVE REPLACEMENT (AVR) WITH MAGNA EASE PERICARDIAL BIOPROSTHESIS - AORTIC 21 MM MODEL 3300TFX;  Surgeon: [name], MD;  Location: [facility] OR;  Service: Open Heart Surgery;  Laterality: N/A;  . ATRIAL FIBRILLATION ABLATION N/A 06/28/2018   Procedure: ATRIAL FIBRILLATION ABLATION;  Surgeon: Hillis Range, MD;  Location: [facility] INVASIVE CV LAB;  Service: Cardiovascular;  Laterality: N/A;  . CARDIOVERSION N/A 02/16/2018   Procedure: CARDIOVERSION;  Surgeon: Lars Masson, MD;  Location: [facility] ENDOSCOPY;  Service: Cardiovascular;  Laterality: N/A;  . CARDIOVERSION N/A 08/02/2018   Procedure: CARDIOVERSION;  Surgeon: Chilton Si, MD;  Location: [facility] ENDOSCOPY;  Service: Cardiovascular;  Laterality: N/A;  . EYE SURGERY  2005   Lasik  . TEE WITHOUT CARDIOVERSION N/A 05/03/2016   Procedure: TRANSESOPHAGEAL ECHOCARDIOGRAM (TEE);  Surgeon: [name], MD;  Location: [facility] OR;  Service: Open Heart Surgery;  Laterality: N/A;  . TONSILLECTOMY AND ADENOIDECTOMY      Current Outpatient Medications  Medication Sig Dispense Refill  . acetaminophen (TYLENOL) 500 MG tablet Take 500-1,000 mg by mouth every 6 (six) hours as needed (for pain.).    Marland Kitchen ELIQUIS 5 MG TABS tablet TAKE 1 TABLET BY MOUTH TWICE DAILY. 60 tablet 5  . furosemide (LASIX) 20 MG tablet Take 20 mg by mouth daily.    Marland Kitchen LORazepam (ATIVAN) 0.5 MG tablet Take 0.5 mg by mouth daily as needed for anxiety.     Marland Kitchen MELATONIN PO Take 1 tablet by mouth at bedtime as needed (sleep).    Marland Kitchen  metoprolol tartrate (LOPRESSOR) 25 MG tablet Take 0.5 tablets (12.5 mg total) by mouth 2 (two) times daily. 90 tablet 3  . spironolactone (ALDACTONE) 25 MG tablet Take 1 tablet (25 mg total) by mouth daily. Take at night 90 tablet 3   No current facility-administered medications for this visit.    Allergies:   Tikosyn [dofetilide], Codeine, and Vicodin [hydrocodone-acetaminophen]   Social History:  The patient   reports that she has quit smoking. She has never used smokeless tobacco. She reports current alcohol use. She reports that she does not use drugs.   Family History:  The patient's family history includes Breast cancer (age of onset: 10) in her sister; Congestive Heart Failure in her maternal grandfather; Deep vein thrombosis in her brother, father, paternal aunt, and paternal grandmother; Diabetes in her mother; Heart disease in her brother; Lung cancer in her brother; Ovarian cancer (age of onset: 68) in her maternal grandmother.   ROS:  Please see the history of present illness.   All other systems are personally reviewed and negative.    Exam:    Vital Signs:  BP 122/86   Pulse 72   Wt 154 lb (69.9 kg)   LMP 08/12/2004   BMI 26.43 kg/m   Well sounding and appearing, alert and conversant, regular work of breathing,  good skin color Eyes- anicteric, neuro- grossly intact, skin- no apparent rash or lesions or cyanosis, mouth- oral mucosa is pink  Labs/Other Tests and Data Reviewed:    Recent Labs: 06/26/2018: B Natriuretic Peptide 762.2 07/25/2018: Magnesium 2.1 07/30/2018: Hemoglobin 15.0; Platelets 221 01/11/2019: ALT 16; BUN 19; Creatinine, Ser 1.49; Potassium 3.9; Sodium 141; TSH 1.210   Wt Readings from Last 3 Encounters:  04/08/19 154 lb (69.9 kg)  02/25/19 159 lb (72.1 kg)  01/09/19 155 lb (70.3 kg)     ASSESSMENT & PLAN:    1.  Persistent atrial fibrillation She is following with [facility] and has had no afib.  Heart rate is typically 70s. She has done very well post ablation and is now off of amiodarone Continue eliquis for chads2vasc score of at least 3  2. 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 She wishes to stop lasix I have discussed importance of daily weights She will take lasix prn Continue spironolactone Consider adding losartan on return to see EP PA if creatinine is stable  3. Chronic renal insufficiency (stage III) Repeat  bmet on return Change lasix to prn Add losartan on return as above  Follow-up:  3 months with EP PA Oda Kilts) I will see in 6 months followp-up with Dr Burt Knack as scheduled   Patient Risk:  after full review of this patients clinical status, I feel that they are at moderate risk at this time.  Today, I have spent 15 minutes with the patient with telehealth technology discussing arrhythmia management .    Army Fossa, MD  04/08/2019 10:11 AM     [facility] [address] [place] [address] [phone] (office) [phone] (fax)

## 2019-06-07 ENCOUNTER — Ambulatory Visit: Payer: Medicare Other | Admitting: Cardiovascular Disease

## 2019-07-05 ENCOUNTER — Telehealth: Payer: Self-pay

## 2019-07-05 NOTE — Telephone Encounter (Signed)
Lpm that she may arrive early on 4/26 for her 1pm appointment.

## 2019-07-07 NOTE — Progress Notes (Signed)
spiro  PCP:  [place], [name], MD Primary Cardiologist: Tonny Bollman, MD Electrophysiologist: Dr. Danielle Rankin Wanda [name] is a 69 y.o. female seen today for None for routine electrophysiology followup.  Since last being seen in our clinic the patient reports doing well overall. She has stopped spironolactone due to weight gain and feeling like she was dehydrated, would prefer not to re-challenge..  she denies chest pain, palpitations, dyspnea, PND, orthopnea, nausea, vomiting, dizziness, syncope, edema, weight gain, or early satiety.  Past Medical History:  Diagnosis Date  . Anxiety   . Arthritis 2018   hands have general aches  . CHF (congestive heart failure) (HCC) 2018   has shortness of breath since CHF diagnosis and has increased recently; states this is reason for admission  . Chronic systolic heart failure (HCC) 05/25/2016   tachycardia mediated,  resolved previously with sinus rhythm  . Depression   . Dyspnea 2018  . Migraine   . Nonischemic cardiomyopathy (HCC)    tachycardia mediated,  resolved previously with sinus rhythm  . Persistent atrial fibrillation (HCC) 05/13/2016  . PVC's (premature ventricular contractions) 05/25/2016   Holter 11/17 The basic rhythm is sinus. There are frequent PVCs, periods of ventricular bigeminy, and occasional ventricular couplets and triplets.  . S/P AVR (aortic valve replacement)    Past Surgical History:  Procedure Laterality Date  . AORTIC VALVE REPLACEMENT N/A 05/03/2016   Procedure: AORTIC VALVE REPLACEMENT (AVR) WITH MAGNA EASE PERICARDIAL BIOPROSTHESIS - AORTIC 21 MM MODEL 3300TFX;  Surgeon: [name], MD;  Location: [facility] OR;  Service: Open Heart Surgery;  Laterality: N/A;  . ATRIAL FIBRILLATION ABLATION N/A 06/28/2018   Procedure: ATRIAL FIBRILLATION ABLATION;  Surgeon: Hillis Range, MD;  Location: [facility] INVASIVE CV LAB;  Service: Cardiovascular;  Laterality: N/A;  . CARDIOVERSION N/A 02/16/2018   Procedure: CARDIOVERSION;  Surgeon:  Lars Masson, MD;  Location: [facility] ENDOSCOPY;  Service: Cardiovascular;  Laterality: N/A;  . CARDIOVERSION N/A 08/02/2018   Procedure: CARDIOVERSION;  Surgeon: Chilton Si, MD;  Location: [facility] ENDOSCOPY;  Service: Cardiovascular;  Laterality: N/A;  . EYE SURGERY  2005   Lasik  . TEE WITHOUT CARDIOVERSION N/A 05/03/2016   Procedure: TRANSESOPHAGEAL ECHOCARDIOGRAM (TEE);  Surgeon: [name], MD;  Location: [facility] OR;  Service: Open Heart Surgery;  Laterality: N/A;  . TONSILLECTOMY AND ADENOIDECTOMY      Current Outpatient Medications  Medication Sig Dispense Refill  . acetaminophen (TYLENOL) 500 MG tablet Take 500-1,000 mg by mouth every 6 (six) hours as needed (for pain.).    Marland Kitchen amphetamine-dextroamphetamine (ADDERALL) 20 MG tablet daily.    Marland Kitchen ELIQUIS 5 MG TABS tablet TAKE 1 TABLET BY MOUTH TWICE DAILY. 60 tablet 5  . furosemide (LASIX) 20 MG tablet Take 20 mg by mouth daily.    Marland Kitchen LORazepam (ATIVAN) 0.5 MG tablet Take 0.5 mg by mouth daily as needed for anxiety.     Marland Kitchen MELATONIN PO Take 1 tablet by mouth at bedtime as needed (sleep).    . metoprolol tartrate (LOPRESSOR) 25 MG tablet Take 0.5 tablets (12.5 mg total) by mouth 2 (two) times daily. 90 tablet 3  . spironolactone (ALDACTONE) 25 MG tablet Take 1 tablet (25 mg total) by mouth daily. Take at night 90 tablet 3   No current facility-administered medications for this visit.    Allergies  Allergen Reactions  . Tikosyn [Dofetilide] Other (See Comments)    Prolonged QT, nonsustained torsades at low dose  . Codeine Nausea And Vomiting  .  Vicodin [Hydrocodone-Acetaminophen] Nausea And Vomiting    Social History   Socioeconomic History  . Marital status: Married    Spouse name: Not on file  . Number of children: Not on file  . Years of education: Not on file  . Highest education level: Not on file  Occupational History  . Not on file  Tobacco Use  . Smoking status: Former Research scientist (life sciences)  . Smokeless tobacco: Never Used  .  Tobacco comment: stopped in 70's while in college; smoked 1-2 years  Substance and Sexual Activity  . Alcohol use: Yes    Comment: 1-2 glasses of wine; hasn't had wine for last month  . Drug use: No  . Sexual activity: Yes    Partners: Male    Birth control/protection: Other-see comments, Post-menopausal    Comment: vasectomy  Other Topics Concern  . Not on file  Social History Narrative   Lives in [place]   retired   Social Determinants of Health   Financial Resource Strain:   . Difficulty of Paying Living Expenses:   Food Insecurity:   . Worried About Charity fundraiser in the Last Year:   . Arboriculturist in the Last Year:   Transportation Needs:   . Film/video editor (Medical):   Marland Kitchen Lack of Transportation (Non-Medical):   Physical Activity:   . Days of Exercise per Week:   . Minutes of Exercise per Session:   Stress:   . Feeling of Stress :   Social Connections:   . Frequency of Communication with Friends and Family:   . Frequency of Social Gatherings with Friends and Family:   . Attends Religious Services:   . Active Member of Clubs or Organizations:   . Attends Archivist Meetings:   Marland Kitchen Marital Status:   Intimate Partner Violence:   . Fear of Current or Ex-Partner:   . Emotionally Abused:   Marland Kitchen Physically Abused:   . Sexually Abused:      Review of Systems: General: No chills, fever, night sweats or weight changes  Cardiovascular:  No chest pain, dyspnea on exertion, edema, orthopnea, palpitations, paroxysmal nocturnal dyspnea Dermatological: No rash, lesions or masses Respiratory: No cough, dyspnea Urologic: No hematuria, dysuria Abdominal: No nausea, vomiting, diarrhea, bright red blood per rectum, melena, or hematemesis Neurologic: No visual changes, weakness, changes in mental status All other systems reviewed and are otherwise negative except as noted above.  Physical Exam: Vitals:   07/08/19 1317  BP: 132/78  Pulse: 82  SpO2: 96%   Weight: 152 lb (68.9 kg)  Height: 5\' 4"  (1.626 m)    GEN- The patient is well appearing, alert and oriented x 3 today.   HEENT: normocephalic, atraumatic; sclera clear, conjunctiva pink; hearing intact; oropharynx clear; neck supple, no JVP Lymph- no cervical lymphadenopathy Lungs- Clear to ausculation bilaterally, normal work of breathing.  No wheezes, rales, rhonchi Heart- Regular rate and rhythm, no murmurs, rubs or gallops, PMI not laterally displaced GI- soft, non-tender, non-distended, bowel sounds present, no hepatosplenomegaly Extremities- no clubbing, cyanosis, or edema; DP/PT/radial pulses 2+ bilaterally MS- no significant deformity or atrophy Skin- warm and dry, no rash or lesion Psych- euthymic mood, full affect Neuro- strength and sensation are intact  EKG is ordered. Personal review of EKG from today shows NSR at 82 bpm, QRS 110 ms, PR interval 184 ms   Assessment and Plan:  1. Persistent Atrial fibrillation Follows burden using Kardia mobile She has done well s/p ablation. 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  Continue lopressor 12.5 mg BID, she has been taking daily or less. She would like to try this one as directed prior to considering additional options. Ideally, would switch to Toprol (Metoprolol succinate) with CMP Pt agrees to try losartan 12.5 mg qhs. Check BMET 2 weeks.   3. CKI III Bmet 2 weeks starting losartan.   Graciella Freer, PA-C  07/08/19 1:38 PM

## 2019-07-08 ENCOUNTER — Encounter: Payer: Self-pay | Admitting: Student

## 2019-07-08 ENCOUNTER — Ambulatory Visit: Payer: Medicare PPO | Admitting: Student

## 2019-07-08 ENCOUNTER — Other Ambulatory Visit: Payer: Self-pay

## 2019-07-08 ENCOUNTER — Other Ambulatory Visit: Payer: Medicare PPO | Admitting: *Deleted

## 2019-07-08 VITALS — BP 132/78 | HR 82 | Ht 64.0 in | Wt 152.0 lb

## 2019-07-08 DIAGNOSIS — I493 Ventricular premature depolarization: Secondary | ICD-10-CM

## 2019-07-08 DIAGNOSIS — Z79899 Other long term (current) drug therapy: Secondary | ICD-10-CM

## 2019-07-08 DIAGNOSIS — I4819 Other persistent atrial fibrillation: Secondary | ICD-10-CM

## 2019-07-08 DIAGNOSIS — I428 Other cardiomyopathies: Secondary | ICD-10-CM

## 2019-07-08 DIAGNOSIS — I5042 Chronic combined systolic (congestive) and diastolic (congestive) heart failure: Secondary | ICD-10-CM | POA: Diagnosis not present

## 2019-07-08 MED ORDER — LOSARTAN POTASSIUM 25 MG PO TABS: ORAL_TABLET | ORAL | 3 refills | Status: DC

## 2019-07-08 NOTE — Patient Instructions (Addendum)
Medication Instructions:  STOP SPIRONOLACTONE START LOSARTAN 12.5 mg AT BEDTIME *If you need a refill on your cardiac medications before your next appointment, please call your pharmacy*   Lab Work:  2 WEEKS ([place]) BMET If you have labs (blood work) drawn today and your tests are completely normal, you will receive your results only by: Marland Kitchen MyChart Message (if you have MyChart) OR . A paper copy in the mail If you have any lab test that is abnormal or we need to change your treatment, we will call you to review the results.   Testing/Procedures: none   Follow-Up: At [facility], you and your health needs are our priority.  As part of our continuing mission to provide you with exceptional heart care, we have created designated Provider Care Teams.  These Care Teams include your primary Cardiologist (physician) and Advanced Practice Providers (APPs -  Physician Assistants and Nurse Practitioners) who all work together to provide you with the care you need, when you need it.   Your next appointment:   6 MONTHS  The format for your next appointment:   Either In Person or Virtual  Provider:   Dr Rayann Heman   Other Instructions  GOAL BP (TOP NUMBER LESS THAN 130)  Losartan Tablets What is this medicine? LOSARTAN (loe SAR tan) is an angiotensin II receptor blocker, also known as an ARB. It treats high blood pressure. It can slow kidney damage in some patients. It may also be used to lower the risk of stroke. This medicine may be used for other purposes; ask your health care provider or pharmacist if you have questions. COMMON BRAND NAME(S): Cozaar What should I tell my health care provider before I take this medicine? They need to know if you have any of these conditions:  heart failure  kidney or liver disease  an unusual or allergic reaction to losartan, other medicines, foods, dyes, or preservatives  pregnant or trying to get pregnant  breast-feeding How  should I use this medicine? Take this drug by mouth. Take it as directed on the prescription label at the same time every day. You can take it with or without food. If it upsets your stomach, take it with food. Keep taking it unless your health care provider tells you to stop. Talk to your health care provider about the use of this drug in children. While it may be prescribed for children as young as 6 for selected conditions, precautions do apply. Overdosage: If you think you have taken too much of this medicine contact a poison control center or emergency room at once. NOTE: This medicine is only for you. Do not share this medicine with others. What if I miss a dose? If you miss a dose, take it as soon as you can. If it is almost time for your next dose, take only that dose. Do not take double or extra doses. What may interact with this medicine?  blood pressure medicines  diuretics, especially triamterene, spironolactone, or amiloride  fluconazole  NSAIDs, medicines for pain and inflammation, like ibuprofen or naproxen  potassium salts or potassium supplements  rifampin This list may not describe all possible interactions. Give your health care provider a list of all the medicines, herbs, non-prescription drugs, or dietary supplements you use. Also tell them if you smoke, drink alcohol, or use illegal drugs. Some items may interact with your medicine. What should I watch for while using this medicine? Visit your doctor or health care professional for  regular checks on your progress. Check your blood pressure as directed. Ask your doctor or health care professional what your blood pressure should be and when you should contact him or her. Call your doctor or health care professional if you notice an irregular or fast heart beat. Women should inform their doctor if they wish to become pregnant or think they might be pregnant. There is a potential for serious side effects to an unborn child,  particularly in the second or third trimester. Talk to your health care professional or pharmacist for more information. You may get drowsy or dizzy. Do not drive, use machinery, or do anything that needs mental alertness until you know how this drug affects you. Do not stand or sit up quickly, especially if you are an older patient. This reduces the risk of dizzy or fainting spells. Alcohol can make you more drowsy and dizzy. Avoid alcoholic drinks. Avoid salt substitutes unless you are told otherwise by your doctor or health care professional. Do not treat yourself for coughs, colds, or pain while you are taking this medicine without asking your doctor or health care professional for advice. Some ingredients may increase your blood pressure. What side effects may I notice from receiving this medicine? Side effects that you should report to your doctor or health care professional as soon as possible:  confusion, dizziness, light headedness or fainting spells  decreased amount of urine passed  difficulty breathing or swallowing, hoarseness, or tightening of the throat  fast or irregular heart beat, palpitations, or chest pain  skin rash, itching  swelling of your face, lips, tongue, hands, or feet Side effects that usually do not require medical attention (report to your doctor or health care professional if they continue or are bothersome):  cough  decreased sexual function or desire  headache  nasal congestion or stuffiness  nausea or stomach pain  sore or cramping muscles This list may not describe all possible side effects. Call your doctor for medical advice about side effects. You may report side effects to FDA at 1-800-FDA-1088. Where should I keep my medicine? Keep out of the reach of children and pets. Store at room temperature between 15 and 30 degrees C (59 and 86 degrees F). Protect from light. Keep the container tightly closed. Throw away any unused drug after the  expiration date. NOTE: This sheet is a summary. It may not cover all possible information. If you have questions about this medicine, talk to your doctor, pharmacist, or health care provider.  2020 Elsevier/Gold Standard (2018-10-03 12:12:28)

## 2019-07-09 LAB — COMPREHENSIVE METABOLIC PANEL
ALT: 11 IU/L (ref 0–32)
AST: 14 IU/L (ref 0–40)
Albumin/Globulin Ratio: 1.9 (ref 1.2–2.2)
Albumin: 4.8 g/dL (ref 3.8–4.8)
Alkaline Phosphatase: 57 IU/L (ref 39–117)
BUN/Creatinine Ratio: 18 (ref 12–28)
BUN: 21 mg/dL (ref 8–27)
Bilirubin Total: 0.3 mg/dL (ref 0.0–1.2)
CO2: 22 mmol/L (ref 20–29)
Calcium: 9.8 mg/dL (ref 8.7–10.3)
Chloride: 104 mmol/L (ref 96–106)
Creatinine, Ser: 1.17 mg/dL — ABNORMAL HIGH (ref 0.57–1.00)
GFR calc Af Amer: 56 mL/min/{1.73_m2} — ABNORMAL LOW (ref >59)
GFR calc non Af Amer: 48 mL/min/{1.73_m2} — ABNORMAL LOW (ref >59)
Globulin, Total: 2.5 g/dL (ref 1.5–4.5)
Glucose: 94 mg/dL (ref 65–99)
Potassium: 4.8 mmol/L (ref 3.5–5.2)
Sodium: 141 mmol/L (ref 134–144)
Total Protein: 7.3 g/dL (ref 6.0–8.5)

## 2019-07-23 ENCOUNTER — Other Ambulatory Visit: Payer: Self-pay

## 2019-07-23 ENCOUNTER — Other Ambulatory Visit: Payer: Medicare PPO | Admitting: *Deleted

## 2019-07-23 DIAGNOSIS — Z79899 Other long term (current) drug therapy: Secondary | ICD-10-CM

## 2019-07-23 DIAGNOSIS — I5042 Chronic combined systolic (congestive) and diastolic (congestive) heart failure: Secondary | ICD-10-CM

## 2019-07-23 DIAGNOSIS — I493 Ventricular premature depolarization: Secondary | ICD-10-CM

## 2019-07-23 DIAGNOSIS — I4819 Other persistent atrial fibrillation: Secondary | ICD-10-CM

## 2019-07-24 ENCOUNTER — Other Ambulatory Visit: Payer: Self-pay | Admitting: Pharmacist

## 2019-07-24 LAB — BASIC METABOLIC PANEL
BUN/Creatinine Ratio: 19 (ref 12–28)
BUN: 22 mg/dL (ref 8–27)
CO2: 25 mmol/L (ref 20–29)
Calcium: 9.6 mg/dL (ref 8.7–10.3)
Chloride: 102 mmol/L (ref 96–106)
Creatinine, Ser: 1.17 mg/dL — ABNORMAL HIGH (ref 0.57–1.00)
GFR calc Af Amer: 56 mL/min/{1.73_m2} — ABNORMAL LOW (ref >59)
GFR calc non Af Amer: 48 mL/min/{1.73_m2} — ABNORMAL LOW (ref >59)
Glucose: 120 mg/dL — ABNORMAL HIGH (ref 65–99)
Potassium: 4.3 mmol/L (ref 3.5–5.2)
Sodium: 141 mmol/L (ref 134–144)

## 2019-07-24 MED ORDER — APIXABAN 5 MG PO TABS: 5.0000 mg | ORAL_TABLET | Freq: Two times a day (BID) | ORAL | 5 refills | Status: DC

## 2019-07-24 NOTE — Telephone Encounter (Signed)
Last OV 48/34 69 years old Scr 1.17 on 07/23/19 68kg

## 2019-08-03 ENCOUNTER — Other Ambulatory Visit: Payer: Self-pay | Admitting: Internal Medicine

## 2019-08-26 ENCOUNTER — Other Ambulatory Visit: Payer: Self-pay | Admitting: Cardiovascular Disease

## 2019-08-26 NOTE — Telephone Encounter (Signed)
Eliquis 5mg  refill request received. Patient is 69 years old, weight-68.9kg, Crea-1.17 on 07/23/2019, Diagnosis-Afib, and last seen by 09/22/2019 PA on 07/08/2019. Dose is appropriate based on dosing criteria. Will send in refill to requested pharmacy.

## 2019-09-06 ENCOUNTER — Ambulatory Visit: Payer: Medicare PPO | Admitting: Cardiovascular Disease

## 2019-09-06 ENCOUNTER — Other Ambulatory Visit: Payer: Self-pay

## 2019-09-06 ENCOUNTER — Encounter: Payer: Self-pay | Admitting: Cardiovascular Disease

## 2019-09-06 VITALS — BP 114/82 | HR 71 | Ht 64.0 in | Wt 148.8 lb

## 2019-09-06 DIAGNOSIS — I5022 Chronic systolic (congestive) heart failure: Secondary | ICD-10-CM | POA: Diagnosis not present

## 2019-09-06 DIAGNOSIS — I359 Nonrheumatic aortic valve disorder, unspecified: Secondary | ICD-10-CM | POA: Diagnosis not present

## 2019-09-06 DIAGNOSIS — I4819 Other persistent atrial fibrillation: Secondary | ICD-10-CM | POA: Diagnosis not present

## 2019-09-06 MED ORDER — METOPROLOL SUCCINATE ER 25 MG PO TB24
12.5000 mg | ORAL_TABLET | Freq: Every day | ORAL | 3 refills | Status: DC
Start: 2019-09-06 — End: 2020-10-12

## 2019-09-06 NOTE — Patient Instructions (Addendum)
Medication Instructions:  1) START TOPROL (metoprolol succinate) 12.g mg daily 2) STOP METOPROLOL tartate *If you need a refill on your cardiac medications before your next appointment, please call your pharmacy*  Testing/Procedures: Your provider has requested that you have an echocardiogram in 6 months. Echocardiography is a painless test that uses sound waves to create images of your heart. It provides your doctor with information about the size and shape of your heart and how well your heart's chambers and valves are working. This procedure takes approximately one hour. There are no restrictions for this procedure.    Follow-Up: Your provider recommends that you schedule a follow-up appointment in: 6 months.

## 2019-09-06 NOTE — Progress Notes (Signed)
Cardiology Office Note:    Date:  09/06/2019   ID:  Wanda [name], DOB 1951/03/12, MRN [id number]  PCP:  [place], [name], MD  [facility] Cardiologist:  Tonny Bollman, MD  [facility] Electrophysiologist:  None   Referring MD: [place], [name], MD   Chief Complaint  Patient presents with  . Follow-up    CHF/atrial fibrillation    History of Present Illness:    Wanda [name] is a 69 y.o. female with a hx of severe aortic insufficiency status post aortic valve replacement in 2018.  The patient has had chronic systolic heart failure with a severe exacerbation related to atrial fibrillation.  She developed very severe LV dysfunction in the setting of atrial fibrillation with poorly controlled heart rate.  She underwent atrial fib ablation and has had an excellent response with maintenance of sinus rhythm.  The patient is here alone today.  She is doing quite well.  She denies symptoms of chest pain, chest pressure, or shortness of breath.  When she was most recently seen in April 2021, low-dose losartan was added but she was unable to tolerate this.  Systolic blood pressures were ranging less than 100 mmHg and she felt very fatigued and weak.  She discontinued this on her own.  She is currently taking metoprolol on a sporadic basis.  She brings in home blood pressure readings which show systolic blood pressures ranging from 90 to 110 mmHg.  She denies orthopnea, PND, or heart palpitations.  She has had no leg swelling.  She is taking Lasix only on an as-needed basis.  Past Medical History:  Diagnosis Date  . Anxiety   . Arthritis 2018   hands have general aches  . CHF (congestive heart failure) (HCC) 2018   has shortness of breath since CHF diagnosis and has increased recently; states this is reason for admission  . Chronic systolic heart failure (HCC) 05/25/2016   tachycardia mediated,  resolved previously with sinus rhythm  . Depression   . Dyspnea 2018  . Migraine   . Nonischemic  cardiomyopathy (HCC)    tachycardia mediated,  resolved previously with sinus rhythm  . Persistent atrial fibrillation (HCC) 05/13/2016  . PVC's (premature ventricular contractions) 05/25/2016   Holter 11/17 The basic rhythm is sinus. There are frequent PVCs, periods of ventricular bigeminy, and occasional ventricular couplets and triplets.  . S/P AVR (aortic valve replacement)     Past Surgical History:  Procedure Laterality Date  . AORTIC VALVE REPLACEMENT N/A 05/03/2016   Procedure: AORTIC VALVE REPLACEMENT (AVR) WITH MAGNA EASE PERICARDIAL BIOPROSTHESIS - AORTIC 21 MM MODEL 3300TFX;  Surgeon: [name], MD;  Location: [facility] OR;  Service: Open Heart Surgery;  Laterality: N/A;  . ATRIAL FIBRILLATION ABLATION N/A 06/28/2018   Procedure: ATRIAL FIBRILLATION ABLATION;  Surgeon: Hillis Range, MD;  Location: [facility] INVASIVE CV LAB;  Service: Cardiovascular;  Laterality: N/A;  . CARDIOVERSION N/A 02/16/2018   Procedure: CARDIOVERSION;  Surgeon: Lars Masson, MD;  Location: [facility] ENDOSCOPY;  Service: Cardiovascular;  Laterality: N/A;  . CARDIOVERSION N/A 08/02/2018   Procedure: CARDIOVERSION;  Surgeon: Chilton Si, MD;  Location: [facility] ENDOSCOPY;  Service: Cardiovascular;  Laterality: N/A;  . EYE SURGERY  2005   Lasik  . TEE WITHOUT CARDIOVERSION N/A 05/03/2016   Procedure: TRANSESOPHAGEAL ECHOCARDIOGRAM (TEE);  Surgeon: [name], MD;  Location: [facility] OR;  Service: Open Heart Surgery;  Laterality: N/A;  . TONSILLECTOMY AND ADENOIDECTOMY      Current Medications: Current Meds  Medication Sig  . acetaminophen (TYLENOL) 500 MG tablet Take 500-1,000 mg by mouth every 6 (six) hours as needed (for pain.).  Marland Kitchen amphetamine-dextroamphetamine (ADDERALL) 20 MG tablet daily.  Marland Kitchen ELIQUIS 5 MG TABS tablet TAKE 1 TABLET BY MOUTH TWICE DAILY.  . furosemide (LASIX) 20 MG tablet Take 20 mg by mouth as needed.  Marland Kitchen LORazepam (ATIVAN) 0.5 MG tablet Take 0.5 mg by mouth daily as needed for anxiety.   Marland Kitchen  losartan (COZAAR) 25 MG tablet Take (0.5 tablet 12.5 mg) At Bedtime  . MELATONIN PO Take 1 tablet by mouth at bedtime as needed (sleep).  . metoprolol tartrate (LOPRESSOR) 25 MG tablet Take 25 mg by mouth daily.  . [DISCONTINUED] furosemide (LASIX) 20 MG tablet Take 1 tablet (20 mg total) by mouth daily. (Patient taking differently: Take 20 mg by mouth as needed. )     Allergies:   Tikosyn [dofetilide], Codeine, and Vicodin [hydrocodone-acetaminophen]   Social History   Socioeconomic History  . Marital status: Married    Spouse name: Not on file  . Number of children: Not on file  . Years of education: Not on file  . Highest education level: Not on file  Occupational History  . Not on file  Tobacco Use  . Smoking status: Former Research scientist (life sciences)  . Smokeless tobacco: Never Used  . Tobacco comment: stopped in 70's while in college; smoked 1-2 years  Vaping Use  . Vaping Use: Never used  Substance and Sexual Activity  . Alcohol use: Yes    Comment: 1-2 glasses of wine; hasn't had wine for last month  . Drug use: No  . Sexual activity: Yes    Partners: Male    Birth control/protection: Other-see comments, Post-menopausal    Comment: vasectomy  Other Topics Concern  . Not on file  Social History Narrative   Lives in [place]   retired   Social Determinants of Health   Financial Resource Strain:   . Difficulty of Paying Living Expenses:   Food Insecurity:   . Worried About Charity fundraiser in the Last Year:   . Arboriculturist in the Last Year:   Transportation Needs:   . Film/video editor (Medical):   Marland Kitchen Lack of Transportation (Non-Medical):   Physical Activity:   . Days of Exercise per Week:   . Minutes of Exercise per Session:   Stress:   . Feeling of Stress :   Social Connections:   . Frequency of Communication with Friends and Family:   . Frequency of Social Gatherings with Friends and Family:   . Attends Religious Services:   . Active Member of Clubs or  Organizations:   . Attends Archivist Meetings:   Marland Kitchen Marital Status:      Family History: The patient's family history includes Breast cancer (age of onset: 70) in her sister; Congestive Heart Failure in her maternal grandfather; Deep vein thrombosis in her brother, father, paternal aunt, and paternal grandmother; Diabetes in her mother; Heart disease in her brother; Lung cancer in her brother; Ovarian cancer (age of onset: 9) in her maternal grandmother.  ROS:   Please see the history of present illness.    All other systems reviewed and are negative.  EKGs/Labs/Other Studies Reviewed:    The following studies were reviewed today: Echo 01-11-2019: IMPRESSIONS    1. Left ventricular ejection fraction, by visual estimation, is 40 to  45%. The left ventricle has mildly decreased function. There is no left  ventricular hypertrophy.  2. Abnormal septal motion consistent with left bundle branch block.  3. Left ventricular diastolic parameters are consistent with Grade I  diastolic dysfunction (impaired relaxation).  4. The left ventricle demonstrates global hypokinesis.  5. Global right ventricle has normal systolic function.The right  ventricular size is normal. No increase in right ventricular wall  thickness.  6. Left atrial size was normal.  7. Right atrial size was normal.  8. The mitral valve is normal in structure. Mild to moderate mitral valve  regurgitation.  9. The tricuspid valve is normal in structure. Tricuspid valve  regurgitation is mild.  10. Aortic valve regurgitation is not visualized. Moderate aortic valve  stenosis.  11. The pulmonic valve was grossly normal. Pulmonic valve regurgitation is  not visualized.  12. The aortic root was not well visualized.  13. There is mild dilatation of the ascending aorta measuring 37 mm.  14. Normal pulmonary artery systolic pressure.  15. There is further improvement in LV systolic function compared to   08/27/2018. Aortic valve prosthesis gradients have increased due to  improved LV contractility. The dimensionless obstructive index is  unchanged.   In comparison to the previous echocardiogram(s): 08/27/18 EF 35-40%. AV  mean, peak.   EKG:  EKG is not ordered today.   Recent Labs: 01/11/2019: TSH 1.210 07/08/2019: ALT 11 07/23/2019: BUN 22; Creatinine, Ser 1.17; Potassium 4.3; Sodium 141  Recent Lipid Panel    Component Value Date/Time   CHOL 247 (H) 08/18/2015 1600   TRIG 73 08/18/2015 1600   HDL 108 08/18/2015 1600   CHOLHDL 2.3 08/18/2015 1600   VLDL 15 08/18/2015 1600   LDLCALC 124 08/18/2015 1600    Physical Exam:    VS:  BP 114/82   Pulse 71   Ht 5\' 4"  (1.626 m)   Wt 148 lb 12.8 oz (67.5 kg)   LMP 08/12/2004   SpO2 98%   BMI 25.54 kg/m     Wt Readings from Last 3 Encounters:  09/06/19 148 lb 12.8 oz (67.5 kg)  07/08/19 152 lb (68.9 kg)  04/08/19 154 lb (69.9 kg)     GEN:  Well nourished, well developed in no acute distress HEENT: Normal NECK: No JVD; No carotid bruits LYMPHATICS: No lymphadenopathy CARDIAC: RRR, no murmurs, rubs, gallops RESPIRATORY:  Clear to auscultation without rales, wheezing or rhonchi  ABDOMEN: Soft, non-tender, non-distended MUSCULOSKELETAL:  No edema; No deformity  SKIN: Warm and dry NEUROLOGIC:  Alert and oriented x 3 PSYCHIATRIC:  Normal affect   ASSESSMENT:    1. Chronic systolic heart failure (HCC)   2. Aortic valve disease   3. Persistent atrial fibrillation (HCC)    PLAN:    In order of problems listed above:  1. The patient is doing very well with New York Heart Association functional class I symptoms at present.  She has no physical exam signs of volume overload and does not appear to have any significant functional limitation at present.  She is had a very difficult time tolerating even the lowest doses of medication such as losartan and spironolactone.  She currently is taking metoprolol tartrate on  a sporadic basis.  Her blood pressure is running low intermittently and she is symptomatic when her systolic blood pressure is less than 100 mmHg.  I have recommended that she change metoprolol tartrate to metoprolol succinate 12.5 mg daily.  I had like to see her back in 6 months with a follow-up echocardiogram at that time.  Her  last echocardiogram showed some improvement in LV systolic function with an increase from an LVEF of 35 to 40% on the prior study now up to 40 to 45% on the most recent study from January 11, 2019. 2. Normal valve function on most recent echo.  Continue apixaban for anticoagulation.  SBE prophylaxis per guidelines. 3. Maintaining sinus rhythm after ablation.  Metoprolol succinate to be added today.  Continue apixaban for anticoagulation.  Medication Adjustments/Labs and Tests Ordered: Current medicines are reviewed at length with the patient today.  Concerns regarding medicines are outlined above.  No orders of the defined types were placed in this encounter.  No orders of the defined types were placed in this encounter.   Patient Instructions  Medication Instructions:  1) START TOPROL (metoprolol succinate) 12.g mg daily *If you need a refill on your cardiac medications before your next appointment, please call your pharmacy*  Testing/Procedures: Your provider has requested that you have an echocardiogram in 6 months. Echocardiography is a painless test that uses sound waves to create images of your heart. It provides your doctor with information about the size and shape of your heart and how well your heart's chambers and valves are working. This procedure takes approximately one hour. There are no restrictions for this procedure.    Follow-Up: Your provider recommends that you schedule a follow-up appointment in: 6 months.    Signed, Tonny Bollman, MD  09/06/2019 4:20 PM     [facility]

## 2019-10-07 ENCOUNTER — Ambulatory Visit: Payer: Medicare PPO | Admitting: Internal Medicine

## 2019-10-17 ENCOUNTER — Telehealth: Payer: Self-pay

## 2019-10-17 NOTE — Telephone Encounter (Signed)
lmtcb about upcoming 11/04/19 appt with Otilio Saber, PA-C

## 2019-11-05 ENCOUNTER — Ambulatory Visit: Payer: Medicare PPO | Admitting: Student

## 2020-01-08 ENCOUNTER — Telehealth (INDEPENDENT_AMBULATORY_CARE_PROVIDER_SITE_OTHER): Payer: Medicare PPO | Admitting: Internal Medicine

## 2020-01-08 ENCOUNTER — Other Ambulatory Visit: Payer: Self-pay

## 2020-01-08 ENCOUNTER — Telehealth: Payer: Self-pay | Admitting: *Deleted

## 2020-01-08 DIAGNOSIS — I255 Ischemic cardiomyopathy: Secondary | ICD-10-CM

## 2020-01-08 DIAGNOSIS — D6869 Other thrombophilia: Secondary | ICD-10-CM

## 2020-01-08 DIAGNOSIS — I4819 Other persistent atrial fibrillation: Secondary | ICD-10-CM

## 2020-01-08 DIAGNOSIS — I5022 Chronic systolic (congestive) heart failure: Secondary | ICD-10-CM

## 2020-01-08 NOTE — Telephone Encounter (Signed)
-----   Message from Hillis Range, MD sent at 01/08/2020 11:01 AM EDT ----- Order an echo to evalaute nonischemic CM/ afib (to be performed in next couple weeks)   Follow-up with Otilio Saber in 6 months I will see in a year

## 2020-01-08 NOTE — Progress Notes (Signed)
Electrophysiology TeleHealth Note   Due to national recommendations of social distancing due to COVID 19, an audio/video telehealth visit is felt to be most appropriate for this patient at this time.  See MyChart message from today for the patient's consent to telehealth for [facility]  Date:  01/08/2020   ID:  Wanda [name], DOB 1950-03-20, MRN [id number]  Location: patient's home  Provider location:  [place]  Evaluation Performed: Follow-up visit  PCP:  [place], [name], MD   Electrophysiologist:  Dr Johney Frame  Chief Complaint:  palpitations  History of Present Illness:    Wanda [name] is a 69 y.o. female who presents via telehealth conferencing today.  Since last being seen in our clinic, the patient reports doing very well.  She is active, without symptoms.  She did had an episode of afib a week ago (confirmed by [place]) , lasting <24 hours.  She has not had any other episodes since I saw her last. Today, she denies symptoms of palpitations, chest pain, shortness of breath,  lower extremity edema, dizziness, presyncope, or syncope.  The patient is otherwise without complaint today.   Past Medical History:  Diagnosis Date  . Anxiety   . Arthritis 2018   hands have general aches  . CHF (congestive heart failure) (HCC) 2018   has shortness of breath since CHF diagnosis and has increased recently; states this is reason for admission  . Chronic systolic heart failure (HCC) 05/25/2016   tachycardia mediated,  resolved previously with sinus rhythm  . Depression   . Dyspnea 2018  . Migraine   . Nonischemic cardiomyopathy (HCC)    tachycardia mediated,  resolved previously with sinus rhythm  . Persistent atrial fibrillation (HCC) 05/13/2016  . PVC's (premature ventricular contractions) 05/25/2016   Holter 11/17 The basic rhythm is sinus. There are frequent PVCs, periods of ventricular bigeminy, and occasional ventricular couplets and triplets.  . S/P AVR (aortic valve  replacement)     Past Surgical History:  Procedure Laterality Date  . AORTIC VALVE REPLACEMENT N/A 05/03/2016   Procedure: AORTIC VALVE REPLACEMENT (AVR) WITH MAGNA EASE PERICARDIAL BIOPROSTHESIS - AORTIC 21 MM MODEL 3300TFX;  Surgeon: [name], MD;  Location: [facility] OR;  Service: Open Heart Surgery;  Laterality: N/A;  . ATRIAL FIBRILLATION ABLATION N/A 06/28/2018   Procedure: ATRIAL FIBRILLATION ABLATION;  Surgeon: Hillis Range, MD;  Location: [facility] INVASIVE CV LAB;  Service: Cardiovascular;  Laterality: N/A;  . CARDIOVERSION N/A 02/16/2018   Procedure: CARDIOVERSION;  Surgeon: Lars Masson, MD;  Location: [facility] ENDOSCOPY;  Service: Cardiovascular;  Laterality: N/A;  . CARDIOVERSION N/A 08/02/2018   Procedure: CARDIOVERSION;  Surgeon: Chilton Si, MD;  Location: [facility] ENDOSCOPY;  Service: Cardiovascular;  Laterality: N/A;  . EYE SURGERY  2005   Lasik  . TEE WITHOUT CARDIOVERSION N/A 05/03/2016   Procedure: TRANSESOPHAGEAL ECHOCARDIOGRAM (TEE);  Surgeon: [name], MD;  Location: [facility] OR;  Service: Open Heart Surgery;  Laterality: N/A;  . TONSILLECTOMY AND ADENOIDECTOMY      Current Outpatient Medications  Medication Sig Dispense Refill  . acetaminophen (TYLENOL) 500 MG tablet Take 500-1,000 mg by mouth every 6 (six) hours as needed (for pain.).    Marland Kitchen amphetamine-dextroamphetamine (ADDERALL) 20 MG tablet daily.    Marland Kitchen ELIQUIS 5 MG TABS tablet TAKE 1 TABLET BY MOUTH TWICE DAILY. 60 tablet 10  . furosemide (LASIX) 20 MG tablet Take 20 mg by mouth as needed.    Marland Kitchen LORazepam (ATIVAN) 0.5 MG  tablet Take 0.5 mg by mouth daily as needed for anxiety.     Marland Kitchen MELATONIN PO Take 1 tablet by mouth at bedtime as needed (sleep).    . metoprolol succinate (TOPROL XL) 25 MG 24 hr tablet Take 0.5 tablets (12.5 mg total) by mouth daily. 45 tablet 3   No current facility-administered medications for this visit.    Allergies:   Tikosyn [dofetilide], Codeine, and Vicodin [hydrocodone-acetaminophen]    Social History:  The patient  reports that she has quit smoking. She has never used smokeless tobacco. She reports current alcohol use. She reports that she does not use drugs.   ROS:  Please see the history of present illness.   All other systems are personally reviewed and negative.    Exam:    Vital Signs:  LMP 08/12/2004   Well sounding and appearing, alert and conversant, regular work of breathing,  good skin color Eyes- anicteric, neuro- grossly intact, skin- no apparent rash or lesions or cyanosis, mouth- oral mucosa is pink  Labs/Other Tests and Data Reviewed:    Recent Labs: 01/11/2019: TSH 1.210 07/08/2019: ALT 11 07/23/2019: BUN 22; Creatinine, Ser 1.17; Potassium 4.3; Sodium 141   Wt Readings from Last 3 Encounters:  09/06/19 148 lb 12.8 oz (67.5 kg)  07/08/19 152 lb (68.9 kg)  04/08/19 154 lb (69.9 kg)      ASSESSMENT & PLAN:    1.  Persistent atrial fibrillation Well controlled post ablation off AAD therapy chads2vasc score is 3.  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 CRI, stage III Improved with improvement in CHF   Risks, benefits and potential toxicities for medications prescribed and/or refilled reviewed with patient today.   Follow-up:  6 months with Otilio Saber I will see in a year  Patient Risk:  after full review of this patients clinical status, I feel that they are at moderate risk at this time.  Today, I have spent 15 minutes with the patient with telehealth technology discussing arrhythmia management .    Randolm Idol, MD  01/08/2020 10:45 AM     [facility] [address] [place] [address] [phone] (office) [phone] (fax)

## 2020-02-28 ENCOUNTER — Other Ambulatory Visit: Payer: Self-pay

## 2020-02-28 ENCOUNTER — Ambulatory Visit (HOSPITAL_COMMUNITY): Payer: Medicare PPO | Attending: Internal Medicine

## 2020-02-28 DIAGNOSIS — I4819 Other persistent atrial fibrillation: Secondary | ICD-10-CM | POA: Insufficient documentation

## 2020-02-28 DIAGNOSIS — I255 Ischemic cardiomyopathy: Secondary | ICD-10-CM

## 2020-02-29 LAB — ECHOCARDIOGRAM COMPLETE
AR max vel: 1.26 cm2
AV Area VTI: 1.32 cm2
AV Area mean vel: 1.26 cm2
AV Mean grad: 13 mmHg
AV Peak grad: 22.5 mmHg
Ao pk vel: 2.37 m/s
Area-P 1/2: 4.11 cm2
S' Lateral: 3.1 cm

## 2020-03-02 ENCOUNTER — Ambulatory Visit: Payer: Medicare PPO | Admitting: Cardiovascular Disease

## 2020-03-02 ENCOUNTER — Other Ambulatory Visit: Payer: Self-pay

## 2020-03-02 ENCOUNTER — Encounter: Payer: Self-pay | Admitting: Cardiovascular Disease

## 2020-03-02 VITALS — BP 120/78 | HR 67 | Ht 64.0 in | Wt 146.0 lb

## 2020-03-02 DIAGNOSIS — I4819 Other persistent atrial fibrillation: Secondary | ICD-10-CM

## 2020-03-02 DIAGNOSIS — I359 Nonrheumatic aortic valve disorder, unspecified: Secondary | ICD-10-CM

## 2020-03-02 DIAGNOSIS — I5022 Chronic systolic (congestive) heart failure: Secondary | ICD-10-CM

## 2020-03-02 NOTE — Progress Notes (Signed)
Cardiology Office Note:    Date:  03/02/2020   ID:  Wanda [name], DOB June 06, 1950, MRN [id number]  PCP:  [place], [name], MD  [facility] Cardiologist:  Tonny Bollman, MD  [facility] Electrophysiologist:  None   Referring MD: [place], [name], MD   Chief Complaint  Patient presents with  . Atrial Fibrillation    History of Present Illness:    Wanda [name] is a 69 y.o. female with a hx of severe aortic insufficiency status post aortic valve replacement in 2018.  The patient has had chronic systolic heart failure with a severe exacerbation related to atrial fibrillation.  She developed very severe LV dysfunction in the setting of atrial fibrillation with poorly controlled heart rate.  She underwent atrial fib ablation and has had an excellent response with maintenance of sinus rhythm.  LV function has shown consistent improvement over the past few years with most recent echo assessment demonstrated an LVEF of 50 to 55%.  The patient has had a difficult time tolerating medical therapy for congestive heart failure because of symptomatic hypotension.  She has been unable to tolerate even low doses of losartan.  At her most recent office visit she was started on metoprolol succinate 12.5 mg at bedtime.  She has also had trouble tolerating metoprolol tartrate.   She is here alone today. She reports one episode of atrial fibrillation with RVR a few months ago that occurred when she had a night of poor sleep and was under a lot of stress. She otherwise has been doing very well. She remains inconsistent with her metoprolol succinate, taking it about half of the time. She is taking her apixaban regularly. She denies chest pain, chest pressure, orthopnea, or PND.  She is short of breath with walking up hills but is able to walk on level ground without problems.  Past Medical History:  Diagnosis Date  . Anxiety   . Arthritis 2018   hands have general aches  . CHF (congestive heart failure) (HCC)  2018   has shortness of breath since CHF diagnosis and has increased recently; states this is reason for admission  . Chronic systolic heart failure (HCC) 05/25/2016   tachycardia mediated,  resolved previously with sinus rhythm  . Depression   . Dyspnea 2018  . Migraine   . Nonischemic cardiomyopathy (HCC)    tachycardia mediated,  resolved previously with sinus rhythm  . Persistent atrial fibrillation (HCC) 05/13/2016  . PVC's (premature ventricular contractions) 05/25/2016   Holter 11/17 The basic rhythm is sinus. There are frequent PVCs, periods of ventricular bigeminy, and occasional ventricular couplets and triplets.  . S/P AVR (aortic valve replacement)     Past Surgical History:  Procedure Laterality Date  . AORTIC VALVE REPLACEMENT N/A 05/03/2016   Procedure: AORTIC VALVE REPLACEMENT (AVR) WITH MAGNA EASE PERICARDIAL BIOPROSTHESIS - AORTIC 21 MM MODEL 3300TFX;  Surgeon: [name], MD;  Location: [facility] OR;  Service: Open Heart Surgery;  Laterality: N/A;  . ATRIAL FIBRILLATION ABLATION N/A 06/28/2018   Procedure: ATRIAL FIBRILLATION ABLATION;  Surgeon: Hillis Range, MD;  Location: [facility] INVASIVE CV LAB;  Service: Cardiovascular;  Laterality: N/A;  . CARDIOVERSION N/A 02/16/2018   Procedure: CARDIOVERSION;  Surgeon: Lars Masson, MD;  Location: [facility] ENDOSCOPY;  Service: Cardiovascular;  Laterality: N/A;  . CARDIOVERSION N/A 08/02/2018   Procedure: CARDIOVERSION;  Surgeon: Chilton Si, MD;  Location: [facility] ENDOSCOPY;  Service: Cardiovascular;  Laterality: N/A;  . EYE SURGERY  2005   Lasik  .  TEE WITHOUT CARDIOVERSION N/A 05/03/2016   Procedure: TRANSESOPHAGEAL ECHOCARDIOGRAM (TEE);  Surgeon: [name], MD;  Location: [facility] OR;  Service: Open Heart Surgery;  Laterality: N/A;  . TONSILLECTOMY AND ADENOIDECTOMY      Current Medications: Current Meds  Medication Sig  . acetaminophen (TYLENOL) 500 MG tablet Take 500-1,000 mg by mouth every 6 (six) hours as needed (for  pain.).  Marland Kitchen amphetamine-dextroamphetamine (ADDERALL) 20 MG tablet daily.  Marland Kitchen ELIQUIS 5 MG TABS tablet TAKE 1 TABLET BY MOUTH TWICE DAILY.  . furosemide (LASIX) 20 MG tablet Take 20 mg by mouth as needed.  Marland Kitchen LORazepam (ATIVAN) 0.5 MG tablet Take 0.5 mg by mouth daily as needed for anxiety.   Marland Kitchen MELATONIN PO Take 1 tablet by mouth at bedtime as needed (sleep).  . metoprolol succinate (TOPROL XL) 25 MG 24 hr tablet Take 0.5 tablets (12.5 mg total) by mouth daily.     Allergies:   Tikosyn [dofetilide], Codeine, and Vicodin [hydrocodone-acetaminophen]   Social History   Socioeconomic History  . Marital status: Married    Spouse name: Not on file  . Number of children: Not on file  . Years of education: Not on file  . Highest education level: Not on file  Occupational History  . Not on file  Tobacco Use  . Smoking status: Former Games developer  . Smokeless tobacco: Never Used  . Tobacco comment: stopped in 70's while in college; smoked 1-2 years  Vaping Use  . Vaping Use: Never used  Substance and Sexual Activity  . Alcohol use: Yes    Comment: 1-2 glasses of wine; hasn't had wine for last month  . Drug use: No  . Sexual activity: Yes    Partners: Male    Birth control/protection: Other-see comments, Post-menopausal    Comment: vasectomy  Other Topics Concern  . Not on file  Social History Narrative   Lives in [place]   retired   Social Determinants of Corporate investment banker Strain: Not on file  Food Insecurity: Not on file  Transportation Needs: Not on file  Physical Activity: Not on file  Stress: Not on file  Social Connections: Not on file     Family History: The patient's family history includes Breast cancer (age of onset: 40) in her sister; Congestive Heart Failure in her maternal grandfather; Deep vein thrombosis in her brother, father, paternal aunt, and paternal grandmother; Diabetes in her mother; Heart disease in her brother; Lung cancer in her brother; Ovarian  cancer (age of onset: 76) in her maternal grandmother.  ROS:   Please see the history of present illness.    All other systems reviewed and are negative.  EKGs/Labs/Other Studies Reviewed:    The following studies were reviewed today: Echo 02-28-2020: IMPRESSIONS    1. Left ventricular ejection fraction, by estimation, is 50 to 55%. The  left ventricle has low normal function. The left ventricle has no regional  wall motion abnormalities. There is mild left ventricular hypertrophy.  Left ventricular diastolic  parameters are consistent with Grade I diastolic dysfunction (impaired  relaxation).  2. Right ventricular systolic function is normal. The right ventricular  size is normal. Tricuspid regurgitation signal is inadequate for assessing  PA pressure.  3. The mitral valve is grossly normal. Mild to moderate mitral valve  regurgitation.  4. The aortic valve has been repaired/replaced. There is mild thickening  of the aortic valve. Aortic valve regurgitation is trivial. There is a 21  mm Edwards pericardial valve  present in the aortic position. Procedure  Date: 05/03/2016. Aortic valve mean  gradient measures 13.0 mmHg.  5. The inferior vena cava is normal in size with greater than 50%  respiratory variability, suggesting right atrial pressure of 3 mmHg.   Comparison(s): A prior study was performed on 01/11/2019. Prior images  reviewed side by side. LVEF has improved. Prosthetic aortic valve gradient  is lower on today's study.   EKG:  EKG is not ordered today.   Recent Labs: 07/08/2019: ALT 11 07/23/2019: BUN 22; Creatinine, Ser 1.17; Potassium 4.3; Sodium 141  Recent Lipid Panel    Component Value Date/Time   CHOL 247 (H) 08/18/2015 1600   TRIG 73 08/18/2015 1600   HDL 108 08/18/2015 1600   CHOLHDL 2.3 08/18/2015 1600   VLDL 15 08/18/2015 1600   LDLCALC 124 08/18/2015 1600     Risk Assessment/Calculations:     CHA2DS2-VASc Score = 3  This indicates a 3.2%  annual risk of stroke. The patient's score is based upon: CHF History: Yes HTN History: No Diabetes History: No Stroke History: No Vascular Disease History: No Age Score: 1 Gender Score: 1      Physical Exam:    VS:  BP 120/78   Pulse 67   Ht 5\' 4"  (1.626 m)   Wt 146 lb (66.2 kg)   LMP 08/12/2004   SpO2 98%   BMI 25.06 kg/m     Wt Readings from Last 3 Encounters:  03/02/20 146 lb (66.2 kg)  09/06/19 148 lb 12.8 oz (67.5 kg)  07/08/19 152 lb (68.9 kg)     GEN:  Well nourished, well developed in no acute distress HEENT: Normal NECK: No JVD; No carotid bruits LYMPHATICS: No lymphadenopathy CARDIAC: RRR, 2/6 ejection murmur at the right upper sternal border RESPIRATORY:  Clear to auscultation without rales, wheezing or rhonchi  ABDOMEN: Soft, non-tender, non-distended MUSCULOSKELETAL:  No edema; No deformity  SKIN: Warm and dry NEUROLOGIC:  Alert and oriented x 3 PSYCHIATRIC:  Normal affect   ASSESSMENT:    1. Persistent atrial fibrillation (HCC)   2. Chronic systolic heart failure (HCC)   3. Aortic valve disease    PLAN:    In order of problems listed above:  1. Patient doing well overall with one episode of atrial fibrillation lasting less than 24 hours a few months back.  She has had no other symptoms.  She is tolerating oral anticoagulation with apixaban.  Continues to follow routinely with Dr. 07/10/19.  I am really pleased that her LV function has improved on serial echo studies.  She will continue metoprolol succinate and I counseled her regarding the importance of medication compliance today. 2. Stable with functional class II symptoms.  LV function has nearly normalized with an LVEF of 50 to 55%.  Continue low-dose metoprolol succinate.  Unable to tolerate any other medical therapy due to symptomatic hypotension. 3. Normal function of her aortic bioprosthesis is noted on her most recent echo study.  She follows SBE prophylaxis as indicated.         Medication Adjustments/Labs and Tests Ordered: Current medicines are reviewed at length with the patient today.  Concerns regarding medicines are outlined above.  No orders of the defined types were placed in this encounter.  No orders of the defined types were placed in this encounter.   There are no Patient Instructions on file for this visit.   Signed, Johney Frame, MD  03/02/2020 3:29 PM    [facility]  [facility]

## 2020-03-02 NOTE — Patient Instructions (Signed)
Medication Instructions:  Your provider recommends that you continue on your current medications as directed. Please refer to the Current Medication list given to you today.   *If you need a refill on your cardiac medications before your next appointment, please call your pharmacy*  Lab Work: Your provider recommends that you return for lab work in 6 months If you have labs (blood work) drawn today and your tests are completely normal, you will receive your results only by:  MyChart Message (if you have MyChart) OR  A paper copy in the mail If you have any lab test that is abnormal or we need to change your treatment, we will call you to review the results.  Follow-Up: At [facility], you and your health needs are our priority.  As part of our continuing mission to provide you with exceptional heart care, we have created designated Provider Care Teams.  These Care Teams include your primary Cardiologist (physician) and Advanced Practice Providers (APPs -  Physician Assistants and Nurse Practitioners) who all work together to provide you with the care you need, when you need it. Your next appointment:   6 month(s) The format for your next appointment:   In Person Provider:   You may see Tonny Bollman, MD or one of the following Advanced Practice Providers on your designated Care Team:    Tereso Newcomer, PA-C  [place], [place]

## 2020-07-01 ENCOUNTER — Telehealth (HOSPITAL_COMMUNITY): Payer: Self-pay | Admitting: Physician Assistant

## 2020-07-01 NOTE — Telephone Encounter (Signed)
Called and left VM earlier this morning and another this afternoon for pt to call back to schedule appt per message from Dr. Excell Seltzer that pt was having trouble with a-fib.

## 2020-07-02 ENCOUNTER — Other Ambulatory Visit: Payer: Self-pay

## 2020-07-02 ENCOUNTER — Ambulatory Visit (HOSPITAL_COMMUNITY)
Admission: RE | Admit: 2020-07-02 | Discharge: 2020-07-02 | Disposition: A | Discharge status: Home / Self Care | Payer: Medicare PPO | Source: Ambulatory Visit | Attending: Physician Assistant | Admitting: Physician Assistant

## 2020-07-02 ENCOUNTER — Encounter (HOSPITAL_COMMUNITY): Payer: Self-pay | Admitting: Physician Assistant

## 2020-07-02 VITALS — BP 112/74 | HR 107 | Ht 64.0 in | Wt 149.6 lb

## 2020-07-02 DIAGNOSIS — Z8249 Family history of ischemic heart disease and other diseases of the circulatory system: Secondary | ICD-10-CM | POA: Insufficient documentation

## 2020-07-02 DIAGNOSIS — Z87891 Personal history of nicotine dependence: Secondary | ICD-10-CM | POA: Diagnosis not present

## 2020-07-02 DIAGNOSIS — Z79899 Other long term (current) drug therapy: Secondary | ICD-10-CM | POA: Insufficient documentation

## 2020-07-02 DIAGNOSIS — I5042 Chronic combined systolic (congestive) and diastolic (congestive) heart failure: Secondary | ICD-10-CM | POA: Insufficient documentation

## 2020-07-02 DIAGNOSIS — I4819 Other persistent atrial fibrillation: Secondary | ICD-10-CM | POA: Diagnosis not present

## 2020-07-02 DIAGNOSIS — D6869 Other thrombophilia: Secondary | ICD-10-CM

## 2020-07-02 DIAGNOSIS — Z7901 Long term (current) use of anticoagulants: Secondary | ICD-10-CM | POA: Insufficient documentation

## 2020-07-02 LAB — CBC
HCT: 42.4 % (ref 36.0–46.0)
Hemoglobin: 13.8 g/dL (ref 12.0–15.0)
MCH: 31.4 pg (ref 26.0–34.0)
MCHC: 32.5 g/dL (ref 30.0–36.0)
MCV: 96.4 fL (ref 80.0–100.0)
Platelets: 195 10*3/uL (ref 150–400)
RBC: 4.4 MIL/uL (ref 3.87–5.11)
RDW: 12.4 % (ref 11.5–15.5)
WBC: 4.6 10*3/uL (ref 4.0–10.5)
nRBC: 0 % (ref 0.0–0.2)

## 2020-07-02 LAB — BASIC METABOLIC PANEL
Anion gap: 6 (ref 5–15)
BUN: 19 mg/dL (ref 8–23)
CO2: 26 mmol/L (ref 22–32)
Calcium: 9.1 mg/dL (ref 8.9–10.3)
Chloride: 107 mmol/L (ref 98–111)
Creatinine, Ser: 1.12 mg/dL — ABNORMAL HIGH (ref 0.44–1.00)
GFR, Estimated: 54 mL/min — ABNORMAL LOW (ref >60)
Glucose, Bld: 85 mg/dL (ref 70–99)
Potassium: 4.7 mmol/L (ref 3.5–5.1)
Sodium: 139 mmol/L (ref 135–145)

## 2020-07-02 NOTE — Progress Notes (Signed)
Primary Care Physician: [place], [name], MD Referring Physician: Dr. Excell Seltzer Primary EP: Dr Danielle Rankin [name] is a 70 y.o. female with a h/o  aortic insufficiency, combined systolic and diastolic CHF, non-ischemic dilated CM.She underwent bioprosthetic AVR by Dr. Tyrone Sage in February 2018.  Postoperative course was complicated by atrial fibrillation with a rapid rate requiring IV amiodarone.  Echo in June 2019 demonstrated improved LV function with an EF of 55-60% and mild diastolic   She was seen by Dr. Excell Seltzer 02/01/18 after an admission to the hospital in [facility] with atrial fibrillation with rapid rate complicated by congestive heart failure.  She was started on Apixaban for anticoagulation and diltiazem and metoprolol for rate control.  Echo done in the hospital in [place], [place] demonstrated an EF of 45-50 and a normally functioning aortic valve prosthesis.  Elective cardioversion was arranged after 3 weeks of uninterrupted anticoagulation.  She underwent cardioversion on 02/16/2018 with restoration of normal sinus rhythm (after 4 attempts-120 J, 150 J, 200 J x 2).  She had f/u with Wanda Newcomer, PA on 12/20. She felt better for a few days but went back into afib with feeling more fatigued and short of breath.  She is now in the afib clinic, 12/23, to discuss options to restore SR. She does not feel terrible in afib but better in SR. She is dealing with a lot of stress with substance abuse in her son and her concerns for his health. He is currently living with the pt and her husband and going thru detox. She feels this is what made her go back in  afib so quickly. [name] thought that Tikosyn may be a good option but pt will have to check on cost of drug.  F/u in AF clinic 07/20/18. Patient is s/p AF ablation with Dr Johney Frame 06/28/18. Recall she failed Tikosyn 2/2 QT prolongation. She feels more heart racing over the last few days with increased SOB and fatigue. She reports that she  becomes SOB even just walking from one room to the next. She admits that she has significant life stressors right now with ill family members and the passing of the family dog. She brings in her Kardia strips which show paroxysms of afib with RVR and also SR. She does have some abdominal fullness and orthopnea which improved with PRN Lasix. No swallowing or groin issues.  F/u in AF clinic 08/13/18. S/p DCCV on 08/02/18. She reports that she feels improved with less SOB and more energy. She states her hands are not as swollen although she does have abdominal fullness at times. She has not taken an extra dose of Lasix. She reports that her Wanda [name] has shown SR ever since her DCCV.  Return to afib clinic 10/1. She was seen last by Dr. Johney Frame 3 months ago and is here for f/u. In that note he said to decrease amiodarone to 100 mg daily at this visit and eventually wean off. Pt states a couple of weeks ago she started noticing rainbows areound lights, dizziness, and nausea. 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 She has not had any recurrent afib.  Follow up in the AF clinic 07/02/20. Patient reports that she went into afib about one week ago with symptoms of heart racing. Her Kardia mobile device showed afib with HR ~140 bpm. She increased her metoprolol which has helped  slow her heart rate to 90s-100s. There were no specific triggers that she could identify.    Today, she denies symptoms of chest pain, PND, lower extremity edema, dizziness, presyncope, syncope, or neurologic sequela. The patient is tolerating medications without difficulties and is otherwise without complaint today.  Past Medical History:  Diagnosis Date  . Anxiety   . Arthritis 2018   hands have general aches  . CHF (congestive heart failure) (HCC) 2018   has shortness of breath since CHF diagnosis and has increased recently; states  this is reason for admission  . Chronic systolic heart failure (HCC) 05/25/2016   tachycardia mediated,  resolved previously with sinus rhythm  . Depression   . Dyspnea 2018  . Migraine   . Nonischemic cardiomyopathy (HCC)    tachycardia mediated,  resolved previously with sinus rhythm  . Persistent atrial fibrillation (HCC) 05/13/2016  . PVC's (premature ventricular contractions) 05/25/2016   Holter 11/17 The basic rhythm is sinus. There are frequent PVCs, periods of ventricular bigeminy, and occasional ventricular couplets and triplets.  . S/P AVR (aortic valve replacement)    Past Surgical History:  Procedure Laterality Date  . AORTIC VALVE REPLACEMENT N/A 05/03/2016   Procedure: AORTIC VALVE REPLACEMENT (AVR) WITH MAGNA EASE PERICARDIAL BIOPROSTHESIS - AORTIC 21 MM MODEL 3300TFX;  Surgeon: [name], MD;  Location: [facility] OR;  Service: Open Heart Surgery;  Laterality: N/A;  . ATRIAL FIBRILLATION ABLATION N/A 06/28/2018   Procedure: ATRIAL FIBRILLATION ABLATION;  Surgeon: Hillis Range, MD;  Location: [facility] INVASIVE CV LAB;  Service: Cardiovascular;  Laterality: N/A;  . CARDIOVERSION N/A 02/16/2018   Procedure: CARDIOVERSION;  Surgeon: Lars Masson, MD;  Location: [facility] ENDOSCOPY;  Service: Cardiovascular;  Laterality: N/A;  . CARDIOVERSION N/A 08/02/2018   Procedure: CARDIOVERSION;  Surgeon: Chilton Si, MD;  Location: [facility] ENDOSCOPY;  Service: Cardiovascular;  Laterality: N/A;  . EYE SURGERY  2005   Lasik  . TEE WITHOUT CARDIOVERSION N/A 05/03/2016   Procedure: TRANSESOPHAGEAL ECHOCARDIOGRAM (TEE);  Surgeon: [name], MD;  Location: [facility] OR;  Service: Open Heart Surgery;  Laterality: N/A;  . TONSILLECTOMY AND ADENOIDECTOMY      Current Outpatient Medications  Medication Sig Dispense Refill  . acetaminophen (TYLENOL) 500 MG tablet Take 500-1,000 mg by mouth every 6 (six) hours as needed (for pain.).    Marland Kitchen ELIQUIS 5 MG TABS tablet TAKE 1 TABLET BY MOUTH TWICE DAILY. 60  tablet 10  . furosemide (LASIX) 20 MG tablet Take 20 mg by mouth as needed.    Marland Kitchen LORazepam (ATIVAN) 0.5 MG tablet Take 0.5 mg by mouth daily as needed for anxiety.     . Magnesium Gluconate (MAGNESIUM 27 PO) Take by mouth.    . MELATONIN PO Take 1 tablet by mouth at bedtime as needed (sleep).    . metoprolol succinate (TOPROL XL) 25 MG 24 hr tablet Take 0.5 tablets (12.5 mg total) by mouth daily. 45 tablet 3  . Potassium (POTASSIMIN PO) Take by mouth.    Marland Kitchen amphetamine-dextroamphetamine (ADDERALL) 20 MG tablet daily. (Patient not taking: Reported on 07/02/2020)     No current facility-administered medications for this encounter.    Allergies  Allergen Reactions  . Tikosyn [Dofetilide] Other (See Comments)    Prolonged QT, nonsustained torsades at low dose  . Codeine Nausea And Vomiting  . Vicodin [Hydrocodone-Acetaminophen] Nausea And Vomiting    Social History   Socioeconomic History  . Marital status: Married    Spouse name: Not on file  .  Number of children: Not on file  . Years of education: Not on file  . Highest education level: Not on file  Occupational History  . Not on file  Tobacco Use  . Smoking status: Former Games developer  . Smokeless tobacco: Never Used  . Tobacco comment: stopped in 70's while in college; smoked 1-2 years  Vaping Use  . Vaping Use: Never used  Substance and Sexual Activity  . Alcohol use: Yes    Alcohol/week: 1.0 - 2.0 standard drink    Types: 1 - 2 Glasses of wine per week    Comment: 1-2 glasses of wine; hasn't had wine for last month  . Drug use: No  . Sexual activity: Yes    Partners: Male    Birth control/protection: Other-see comments, Post-menopausal    Comment: vasectomy  Other Topics Concern  . Not on file  Social History Narrative   Lives in [place]   retired   Social Determinants of Corporate investment banker Strain: Not on file  Food Insecurity: Not on file  Transportation Needs: Not on file  Physical Activity: Not on  file  Stress: Not on file  Social Connections: Not on file  Intimate Partner Violence: Not on file    Family History  Problem Relation Age of Onset  . Diabetes Mother   . Ovarian cancer Maternal Grandmother 80  . Breast cancer Sister 14  . Congestive Heart Failure Maternal Grandfather   . Heart disease Brother        valve replacement  . Deep vein thrombosis Brother   . Lung cancer Brother   . Deep vein thrombosis Father   . Deep vein thrombosis Paternal Aunt        x2  . Deep vein thrombosis Paternal Grandmother     ROS- All systems are reviewed and negative except as per the HPI above  Physical Exam: Vitals:   07/02/20 1145  BP: 112/74  Pulse: (!) 107  Weight: 67.9 kg  Height: 5\' 4"  (1.626 m)   Wt Readings from Last 3 Encounters:  07/02/20 67.9 kg  03/02/20 66.2 kg  09/06/19 67.5 kg    Labs: Lab Results  Component Value Date   NA 141 07/23/2019   K 4.3 07/23/2019   CL 102 07/23/2019   CO2 25 07/23/2019   GLUCOSE 120 (H) 07/23/2019   BUN 22 07/23/2019   CREATININE 1.17 (H) 07/23/2019   CALCIUM 9.6 07/23/2019   MG 2.1 07/25/2018   Lab Results  Component Value Date   INR 1.8 07/07/2016   Lab Results  Component Value Date   CHOL 247 (H) 08/18/2015   HDL 108 08/18/2015   LDLCALC 124 08/18/2015   TRIG 73 08/18/2015   GEN- The patient is a well appearing female, alert and oriented x 3 today.   HEENT-head normocephalic, atraumatic, sclera clear, conjunctiva pink, hearing intact, trachea midline. Lungs- Clear to ausculation bilaterally, normal work of breathing Heart- irregular rate and rhythm, no rubs or gallops. 2/6 systolic murmur  GI- soft, NT, ND, + BS Extremities- no clubbing, cyanosis, or edema MS- no significant deformity or atrophy Skin- no rash or lesion Psych- euthymic mood, full affect Neuro- strength and sensation are intact   EKG- coarse afib Vent. rate 107 BPM PR interval * ms QRS duration 104 ms QT/QTcB 394/525 ms   Echo  02/28/20 1. Left ventricular ejection fraction, by estimation, is 50 to 55%. The  left ventricle has low normal function. The left ventricle has no  regional  wall motion abnormalities. There is mild left ventricular hypertrophy.  Left ventricular diastolic  parameters are consistent with Grade I diastolic dysfunction (impaired  relaxation).  2. Right ventricular systolic function is normal. The right ventricular  size is normal. Tricuspid regurgitation signal is inadequate for assessing  PA pressure.  3. The mitral valve is grossly normal. Mild to moderate mitral valve  regurgitation.  4. The aortic valve has been repaired/replaced. There is mild thickening  of the aortic valve. Aortic valve regurgitation is trivial. There is a 21  mm Edwards pericardial valve present in the aortic position. Procedure  Date: 05/03/2016. Aortic valve mean  gradient measures 13.0 mmHg.  5. The inferior vena cava is normal in size with greater than 50%  respiratory variability, suggesting right atrial pressure of 3 mmHg.   Comparison(s): A prior study was performed on 01/11/2019. Prior images  reviewed side by side. LVEF has improved. Prosthetic aortic valve gradient  is lower on today's study.  Epic notes reviewed.  Assessment and Plan: 1. Persistent atrial fibrillation S/p ablation with Dr Johney Frame 06/28/18 Patient is in persistent atrial fibrillation.  Will arrange for DCCV. Check bmet/CBC today.  Continue Eliquis 5 mg BID, patient denies any missed doses in the last 3 weeks.  Continue Toprol 25 mg daily for now, decrease to 12.5 mg daily after DCCV.   This patients CHA2DS2-VASc Score and unadjusted Ischemic Stroke Rate (% per year) is equal to 3.2 % stroke rate/year from a score of 3  2. Chronic combined systolic and diastolic CHF EF improved post ablation. No signs or symptoms of fluid overload today.   Follow up in the AF clinic post DCCV.    Wanda Loa PA-C [facility] [address] [place], [place] [address] [phone]

## 2020-07-02 NOTE — Patient Instructions (Addendum)
Cardioversion scheduled for Tuesday, May 3rde  - Arrive at the [facility] and go to admitting at [facility]  - Do not eat or drink anything after midnight the night prior to your procedure.  - Take all your morning medication (except diabetic medications) with a sip of water prior to arrival.  - You will not be able to drive home after your procedure.  - Do NOT miss any doses of your blood thinner - if you should miss a dose please notify our office immediately.  - If you feel as if you go back into normal rhythm prior to scheduled cardioversion, please notify our office immediately. If your procedure is canceled in the cardioversion suite you will be charged a cancellation fee.   Return to normal dosing of metoprolol day of cardioversion

## 2020-07-10 ENCOUNTER — Encounter (HOSPITAL_COMMUNITY): Payer: Self-pay

## 2020-07-10 ENCOUNTER — Other Ambulatory Visit (HOSPITAL_COMMUNITY): Payer: Medicare PPO

## 2020-07-14 ENCOUNTER — Encounter (HOSPITAL_COMMUNITY): Admission: RE | Payer: Self-pay | Source: Home / Self Care

## 2020-07-14 ENCOUNTER — Ambulatory Visit (HOSPITAL_COMMUNITY): Admission: RE | Admit: 2020-07-14 | Payer: Medicare PPO | Source: Home / Self Care | Admitting: Internal Medicine

## 2020-07-14 SURGERY — CARDIOVERSION
Anesthesia: Monitor Anesthesia Care

## 2020-07-21 ENCOUNTER — Other Ambulatory Visit: Payer: Self-pay

## 2020-07-21 ENCOUNTER — Encounter (HOSPITAL_COMMUNITY): Payer: Self-pay | Admitting: Physician Assistant

## 2020-07-21 ENCOUNTER — Ambulatory Visit (HOSPITAL_COMMUNITY)
Admission: RE | Admit: 2020-07-21 | Discharge: 2020-07-21 | Disposition: A | Discharge status: Home / Self Care | Payer: Medicare PPO | Source: Ambulatory Visit | Attending: Physician Assistant | Admitting: Physician Assistant

## 2020-07-21 ENCOUNTER — Other Ambulatory Visit: Payer: Self-pay | Admitting: Cardiovascular Disease

## 2020-07-21 VITALS — BP 112/82 | HR 77 | Ht 64.0 in | Wt 151.8 lb

## 2020-07-21 DIAGNOSIS — Z888 Allergy status to other drugs, medicaments and biological substances status: Secondary | ICD-10-CM | POA: Diagnosis not present

## 2020-07-21 DIAGNOSIS — I5042 Chronic combined systolic (congestive) and diastolic (congestive) heart failure: Secondary | ICD-10-CM | POA: Diagnosis not present

## 2020-07-21 DIAGNOSIS — Z87891 Personal history of nicotine dependence: Secondary | ICD-10-CM | POA: Insufficient documentation

## 2020-07-21 DIAGNOSIS — Z79899 Other long term (current) drug therapy: Secondary | ICD-10-CM | POA: Insufficient documentation

## 2020-07-21 DIAGNOSIS — Z7901 Long term (current) use of anticoagulants: Secondary | ICD-10-CM | POA: Insufficient documentation

## 2020-07-21 DIAGNOSIS — I4819 Other persistent atrial fibrillation: Secondary | ICD-10-CM | POA: Diagnosis present

## 2020-07-21 DIAGNOSIS — Z953 Presence of xenogenic heart valve: Secondary | ICD-10-CM | POA: Diagnosis not present

## 2020-07-21 DIAGNOSIS — D6869 Other thrombophilia: Secondary | ICD-10-CM

## 2020-07-21 NOTE — Telephone Encounter (Signed)
Eliquis 5mg  refill request received. Patient is 70 years old, weight-67.9kg, Crea-1.12 on 07/02/20, Diagnosis-Afib, and last seen by 07/04/20, PA on 07/02/20. Dose is appropriate based on dosing criteria. Will send in refill to requested pharmacy.

## 2020-07-21 NOTE — Progress Notes (Signed)
Primary Care Physician: [place], [name], MD Referring Physician: Dr. Excell Seltzer Primary EP: Dr Danielle Rankin [name] is a 70 y.o. female with a h/o  aortic insufficiency, combined systolic and diastolic CHF, non-ischemic dilated CM.She underwent bioprosthetic AVR by Dr. Tyrone Sage in February 2018.  Postoperative course was complicated by atrial fibrillation with a rapid rate requiring IV amiodarone.  Echo in June 2019 demonstrated improved LV function with an EF of 55-60% and mild diastolic   She was seen by Dr. Excell Seltzer 02/01/18 after an admission to the hospital in [facility] with atrial fibrillation with rapid rate complicated by congestive heart failure.  She was started on Apixaban for anticoagulation and diltiazem and metoprolol for rate control.  Echo done in the hospital in [place], [place] demonstrated an EF of 45-50 and a normally functioning aortic valve prosthesis.  Elective cardioversion was arranged after 3 weeks of uninterrupted anticoagulation.  She underwent cardioversion on 02/16/2018 with restoration of normal sinus rhythm (after 4 attempts-120 J, 150 J, 200 J x 2).  She had f/u with Tereso Newcomer, PA on 12/20. She felt better for a few days but went back into afib with feeling more fatigued and short of breath.  She is now in the afib clinic, 12/23, to discuss options to restore SR. She does not feel terrible in afib but better in SR. She is dealing with a lot of stress with substance abuse in her son and her concerns for his health. He is currently living with the pt and her husband and going thru detox. She feels this is what made her go back in  afib so quickly. [name] thought that Tikosyn may be a good option but pt will have to check on cost of drug.  F/u in AF clinic 07/20/18. Patient is s/p AF ablation with Dr Johney Frame 06/28/18. Recall she failed Tikosyn 2/2 QT prolongation. She feels more heart racing over the last few days with increased SOB and fatigue. She reports that she  becomes SOB even just walking from one room to the next. She admits that she has significant life stressors right now with ill family members and the passing of the family dog. She brings in her Kardia strips which show paroxysms of afib with RVR and also SR. She does have some abdominal fullness and orthopnea which improved with PRN Lasix. No swallowing or groin issues.  F/u in AF clinic 08/13/18. S/p DCCV on 08/02/18. She reports that she feels improved with less SOB and more energy. She states her hands are not as swollen although she does have abdominal fullness at times. She has not taken an extra dose of Lasix. She reports that her Lourena Simmonds has shown SR ever since her DCCV.  Return to afib clinic 10/1. She was seen last by Dr. Johney Frame 3 months ago and is here for f/u. In that note he said to decrease amiodarone to 100 mg daily at this visit and eventually wean off. Pt states a couple of weeks ago she started noticing rainbows areound lights, dizziness, and nausea. 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 She has not had any recurrent afib.  Follow up in the AF clinic 07/02/20. Patient reports that she went into afib about one week ago with symptoms of heart racing. Her Kardia mobile device showed afib with HR ~140 bpm. She increased her metoprolol which has helped  slow her heart rate to 90s-100s. There were no specific triggers that she could identify.   Follow up in the AF clinic 07/21/20. Patient called the clinic to report she was no longer in persistent afib. She sent in Kardia mobile strips which showed some intermittent SR, DCCV cancelled. We reviewed her recent Kardia strips in office today which show several episodes of paroxysmal rapid afib.    Today, she denies symptoms of chest pain, PND, lower extremity edema, dizziness, presyncope, syncope, or neurologic sequela. The patient is tolerating  medications without difficulties and is otherwise without complaint today.  Past Medical History:  Diagnosis Date  . Anxiety   . Arthritis 2018   hands have general aches  . CHF (congestive heart failure) (HCC) 2018   has shortness of breath since CHF diagnosis and has increased recently; states this is reason for admission  . Chronic systolic heart failure (HCC) 05/25/2016   tachycardia mediated,  resolved previously with sinus rhythm  . Depression   . Dyspnea 2018  . Migraine   . Nonischemic cardiomyopathy (HCC)    tachycardia mediated,  resolved previously with sinus rhythm  . Persistent atrial fibrillation (HCC) 05/13/2016  . PVC's (premature ventricular contractions) 05/25/2016   Holter 11/17 The basic rhythm is sinus. There are frequent PVCs, periods of ventricular bigeminy, and occasional ventricular couplets and triplets.  . S/P AVR (aortic valve replacement)    Past Surgical History:  Procedure Laterality Date  . AORTIC VALVE REPLACEMENT N/A 05/03/2016   Procedure: AORTIC VALVE REPLACEMENT (AVR) WITH MAGNA EASE PERICARDIAL BIOPROSTHESIS - AORTIC 21 MM MODEL 3300TFX;  Surgeon: [name], MD;  Location: [facility] OR;  Service: Open Heart Surgery;  Laterality: N/A;  . ATRIAL FIBRILLATION ABLATION N/A 06/28/2018   Procedure: ATRIAL FIBRILLATION ABLATION;  Surgeon: Hillis Range, MD;  Location: [facility] INVASIVE CV LAB;  Service: Cardiovascular;  Laterality: N/A;  . CARDIOVERSION N/A 02/16/2018   Procedure: CARDIOVERSION;  Surgeon: Lars Masson, MD;  Location: [facility] ENDOSCOPY;  Service: Cardiovascular;  Laterality: N/A;  . CARDIOVERSION N/A 08/02/2018   Procedure: CARDIOVERSION;  Surgeon: Chilton Si, MD;  Location: [facility] ENDOSCOPY;  Service: Cardiovascular;  Laterality: N/A;  . EYE SURGERY  2005   Lasik  . TEE WITHOUT CARDIOVERSION N/A 05/03/2016   Procedure: TRANSESOPHAGEAL ECHOCARDIOGRAM (TEE);  Surgeon: [name], MD;  Location: [facility] OR;  Service: Open Heart Surgery;   Laterality: N/A;  . TONSILLECTOMY AND ADENOIDECTOMY      Current Outpatient Medications  Medication Sig Dispense Refill  . acetaminophen (TYLENOL) 500 MG tablet Take 500-1,000 mg by mouth every 6 (six) hours as needed (for pain.).    Marland Kitchen Bioflavonoid Products (SUPER C-500 PO) Take 500 mg by mouth daily.    Marland Kitchen ELIQUIS 5 MG TABS tablet TAKE 1 TABLET BY MOUTH TWICE DAILY. 60 tablet 10  . furosemide (LASIX) 20 MG tablet Take 20 mg by mouth daily as needed for fluid or edema.    Marland Kitchen LORazepam (ATIVAN) 0.5 MG tablet Take 0.5 mg by mouth daily as needed for anxiety.     Marland Kitchen MELATONIN PO Take 5 tablets by mouth at bedtime as needed (sleep).    . metoprolol succinate (TOPROL XL) 25 MG 24 hr tablet Take 0.5 tablets (12.5 mg total) by mouth daily. (Patient taking differently: Take 25 mg by mouth daily.) 45 tablet 3  . Potassium (POTASSIMIN PO) Take 99 mg by mouth daily.    Marland Kitchen amphetamine-dextroamphetamine (ADDERALL) 20 MG tablet daily. (Patient not taking: No sig reported)  No current facility-administered medications for this encounter.    Allergies  Allergen Reactions  . Tikosyn [Dofetilide] Other (See Comments)    Prolonged QT, nonsustained torsades at low dose  . Codeine Nausea And Vomiting  . Vicodin [Hydrocodone-Acetaminophen] Nausea And Vomiting    Social History   Socioeconomic History  . Marital status: Married    Spouse name: Not on file  . Number of children: Not on file  . Years of education: Not on file  . Highest education level: Not on file  Occupational History  . Not on file  Tobacco Use  . Smoking status: Former Games developer  . Smokeless tobacco: Never Used  . Tobacco comment: stopped in 70's while in college; smoked 1-2 years  Vaping Use  . Vaping Use: Never used  Substance and Sexual Activity  . Alcohol use: Yes    Alcohol/week: 1.0 - 2.0 standard drink    Types: 1 - 2 Glasses of wine per week    Comment: 1-2 glasses of wine; hasn't had wine for last month  . Drug use:  No  . Sexual activity: Yes    Partners: Male    Birth control/protection: Other-see comments, Post-menopausal    Comment: vasectomy  Other Topics Concern  . Not on file  Social History Narrative   Lives in [place]   retired   Social Determinants of Corporate investment banker Strain: Not on file  Food Insecurity: Not on file  Transportation Needs: Not on file  Physical Activity: Not on file  Stress: Not on file  Social Connections: Not on file  Intimate Partner Violence: Not on file    Family History  Problem Relation Age of Onset  . Diabetes Mother   . Ovarian cancer Maternal Grandmother 80  . Breast cancer Sister 23  . Congestive Heart Failure Maternal Grandfather   . Heart disease Brother        valve replacement  . Deep vein thrombosis Brother   . Lung cancer Brother   . Deep vein thrombosis Father   . Deep vein thrombosis Paternal Aunt        x2  . Deep vein thrombosis Paternal Grandmother     ROS- All systems are reviewed and negative except as per the HPI above  Physical Exam: Vitals:   07/21/20 1454  BP: 112/82  Pulse: 77  Weight: 68.9 kg  Height: 5\' 4"  (1.626 m)   Wt Readings from Last 3 Encounters:  07/21/20 68.9 kg  07/02/20 67.9 kg  03/02/20 66.2 kg    Labs: Lab Results  Component Value Date   NA 139 07/02/2020   K 4.7 07/02/2020   CL 107 07/02/2020   CO2 26 07/02/2020   GLUCOSE 85 07/02/2020   BUN 19 07/02/2020   CREATININE 1.12 (H) 07/02/2020   CALCIUM 9.1 07/02/2020   MG 2.1 07/25/2018   Lab Results  Component Value Date   INR 1.8 07/07/2016   Lab Results  Component Value Date   CHOL 247 (H) 08/18/2015   HDL 108 08/18/2015   LDLCALC 124 08/18/2015   TRIG 73 08/18/2015    GEN- The patient is a well appearing female, alert and oriented x 3 today.   HEENT-head normocephalic, atraumatic, sclera clear, conjunctiva pink, hearing intact, trachea midline. Lungs- Clear to ausculation bilaterally, normal work of  breathing Heart- Regular rate and rhythm, no murmurs, rubs or gallops  GI- soft, NT, ND, + BS Extremities- no clubbing, cyanosis, or edema MS- no significant deformity or  atrophy Skin- no rash or lesion Psych- euthymic mood, full affect Neuro- strength and sensation are intact   EKG today demonstrates SR, NST Vent. rate 77 BPM PR interval 168 ms QRS duration 114 ms QT/QTcB 384/434 ms   Echo 02/28/20 1. Left ventricular ejection fraction, by estimation, is 50 to 55%. The  left ventricle has low normal function. The left ventricle has no regional  wall motion abnormalities. There is mild left ventricular hypertrophy.  Left ventricular diastolic  parameters are consistent with Grade I diastolic dysfunction (impaired  relaxation).  2. Right ventricular systolic function is normal. The right ventricular  size is normal. Tricuspid regurgitation signal is inadequate for assessing  PA pressure.  3. The mitral valve is grossly normal. Mild to moderate mitral valve  regurgitation.  4. The aortic valve has been repaired/replaced. There is mild thickening  of the aortic valve. Aortic valve regurgitation is trivial. There is a 21  mm Edwards pericardial valve present in the aortic position. Procedure  Date: 05/03/2016. Aortic valve mean  gradient measures 13.0 mmHg.  5. The inferior vena cava is normal in size with greater than 50%  respiratory variability, suggesting right atrial pressure of 3 mmHg.   Comparison(s): A prior study was performed on 01/11/2019. Prior images  reviewed side by side. LVEF has improved. Prosthetic aortic valve gradient  is lower on today's study.  Epic notes reviewed.  Assessment and Plan: 1. Persistent atrial fibrillation S/p ablation with Dr Johney Frame 06/28/18 She is having frequent episodes of rapid symptomatic afib.  We discussed therapeutic options today including AAD vs repeat ablation. Her AAD options are limited. She has low normal EF, class 1C  may not be ideal. Recall she failed dofetilide with long QT. She is young for amiodarone. Will refer her back to Dr Johney Frame for consideration of repeat ablation. Patient in agreement with plan.  Continue Eliquis 5 mg BID Continue Toprol 12.5 mg daily with extra 12.5 - 25 mg as needed for heart racing.   This patients CHA2DS2-VASc Score and unadjusted Ischemic Stroke Rate (% per year) is equal to 3.2 % stroke rate/year from a score of 3  2. Chronic combined systolic and diastolic CHF EF improved post ablation. No signs or symptoms of fluid overload.   Follow up with Dr Johney Frame. Dr Excell Seltzer as scheduled.    Wanda Loa PA-C Afib [facility] [address] [place], [place] [address] [phone]

## 2020-07-28 ENCOUNTER — Other Ambulatory Visit: Payer: Self-pay | Admitting: *Deleted

## 2020-07-28 MED ORDER — ELIQUIS 5 MG PO TABS: 1.0000 | ORAL_TABLET | Freq: Two times a day (BID) | ORAL | 5 refills | Status: DC

## 2020-07-28 NOTE — Telephone Encounter (Signed)
Prescription refill request for Eliquis received. Indication: afib  Last office visit:03/02/2020, Cooper Scr: 1.12, 07/02/2020 Age: 70 yo  Weight: 68.9 kg   Pt is on the correct dose of Eliquis per dosing criteria, prescription refill sent for Eliquis 5mg  BID.

## 2020-08-31 ENCOUNTER — Encounter: Payer: Self-pay | Admitting: *Deleted

## 2020-08-31 ENCOUNTER — Encounter: Payer: Self-pay | Admitting: Internal Medicine

## 2020-08-31 ENCOUNTER — Other Ambulatory Visit: Payer: Self-pay

## 2020-08-31 ENCOUNTER — Ambulatory Visit: Payer: Medicare PPO | Admitting: Internal Medicine

## 2020-08-31 VITALS — BP 104/72 | HR 64 | Ht 64.0 in | Wt 157.0 lb

## 2020-08-31 DIAGNOSIS — I4819 Other persistent atrial fibrillation: Secondary | ICD-10-CM | POA: Diagnosis not present

## 2020-08-31 DIAGNOSIS — R000 Tachycardia, unspecified: Secondary | ICD-10-CM | POA: Diagnosis not present

## 2020-08-31 DIAGNOSIS — N289 Disorder of kidney and ureter, unspecified: Secondary | ICD-10-CM | POA: Diagnosis not present

## 2020-08-31 DIAGNOSIS — I43 Cardiomyopathy in diseases classified elsewhere: Secondary | ICD-10-CM | POA: Diagnosis not present

## 2020-08-31 NOTE — Patient Instructions (Addendum)
Medication Instructions:  Your physician recommends that you continue on your current medications as directed. Please refer to the Current Medication list given to you today.  Labwork: None ordered.  Testing/Procedures: Your physician has requested that you have cardiac CT. Cardiac computed tomography (CT) is a painless test that uses an x-ray machine to take clear, detailed pictures of your heart. For further information please visit www.cardiosmart.org. Please follow instruction sheet as given. Your physician has recommended that you have an ablation. Catheter ablation is a medical procedure used to treat some cardiac arrhythmias (irregular heartbeats). During catheter ablation, a long, thin, flexible tube is put into a blood vessel in your groin (upper thigh), or neck. This tube is called an ablation catheter. It is then guided to your heart through the blood vessel. Radio frequency waves destroy small areas of heart tissue where abnormal heartbeats may cause an arrhythmia to start. Please see the instruction sheet given to you today.   Any Other Special Instructions Will Be Listed Below (If Applicable).  If you need a refill on your cardiac medications before your next appointment, please call your pharmacy.   Cardiac Ablation Cardiac ablation is a procedure to destroy (ablate) some heart tissue that is sending bad signals. These bad signals causeproblems in heart rhythm. The heart has many areas that make these signals. If there are problems in these areas, they can make the heart beat in a way that is not normal.Destroying some tissues can help make the heart rhythm normal. Tell your doctor about: Any allergies you have. All medicines you are taking. These include vitamins, herbs, eye drops, creams, and over-the-counter medicines. Any problems you or family members have had with medicines that make you fall asleep (anesthetics). Any blood disorders you have. Any surgeries you have  had. Any medical conditions you have, such as kidney failure. Whether you are pregnant or may be pregnant. What are the risks? This is a safe procedure. But problems may occur, including: Infection. Bruising and bleeding. Bleeding into the chest. Stroke or blood clots. Damage to nearby areas of your body. Allergies to medicines or dyes. The need for a pacemaker if the normal system is damaged. Failure of the procedure to treat the problem. What happens before the procedure? Medicines Ask your doctor about: Changing or stopping your normal medicines. This is important. Taking aspirin and ibuprofen. Do not take these medicines unless your doctor tells you to take them. Taking other medicines, vitamins, herbs, and supplements. General instructions Follow instructions from your doctor about what you cannot eat or drink. Plan to have someone take you home from the hospital or clinic. If you will be going home right after the procedure, plan to have someone with you for 24 hours. Ask your doctor what steps will be taken to prevent infection. What happens during the procedure?  An IV tube will be put into one of your veins. You will be given a medicine to help you relax. The skin on your neck or groin will be numbed. A cut (incision) will be made in your neck or groin. A needle will be put through your cut and into a large vein. A tube (catheter) will be put into the needle. The tube will be moved to your heart. Dye may be put through the tube. This helps your doctor see your heart. Small devices (electrodes) on the tube will send out signals. A type of energy will be used to destroy some heart tissue. The tube will be taken   out. Pressure will be held on your cut. This helps stop bleeding. A bandage will be put over your cut. The exact procedure may vary among doctors and hospitals. What happens after the procedure? You will be watched until you leave the hospital or clinic. This  includes checking your heart rate, breathing rate, oxygen, and blood pressure. Your cut will be watched for bleeding. You will need to lie still for a few hours. Do not drive for 24 hours or as long as your doctor tells you. Summary Cardiac ablation is a procedure to destroy some heart tissue. This is done to treat heart rhythm problems. Tell your doctor about any medical conditions you may have. Tell him or her about all medicines you are taking to treat them. This is a safe procedure. But problems may occur. These include infection, bruising, bleeding, and damage to nearby areas of your body. Follow what your doctor tells you about food and drink. You may also be told to change or stop some of your medicines. After the procedure, do not drive for 24 hours or as long as your doctor tells you. This information is not intended to replace advice given to you by your health care provider. Make sure you discuss any questions you have with your healthcare provider. Document Revised: 01/31/2019 Document Reviewed: 01/31/2019 Elsevier Patient Education  2022 [employer]      

## 2020-08-31 NOTE — Progress Notes (Signed)
PCP: [place], [name], MD Primary Cardiologist: Dr Excell Seltzer Primary EP: Dr Wanda [name] is a 70 y.o. female who presents today for routine electrophysiology followup.  Since last being seen in our clinic, the patient reports doing reasonably well.  Her AF has returned.  + increasing frequency and duration.  Trigger is concern/ heartache regarding her son.  + palpitations and fatigue during AF.  Today, she denies symptoms of chest pain, shortness of breath,  lower extremity edema, dizziness, presyncope, or syncope.  The patient is otherwise without complaint today.   Past Medical History:  Diagnosis Date   Anxiety    Arthritis 2018   hands have general aches   CHF (congestive heart failure) (HCC) 2018   has shortness of breath since CHF diagnosis and has increased recently; states this is reason for admission   Chronic systolic heart failure (HCC) 05/25/2016   tachycardia mediated,  resolved previously with sinus rhythm   Depression    Dyspnea 2018   Migraine    Nonischemic cardiomyopathy (HCC)    tachycardia mediated,  resolved previously with sinus rhythm   Persistent atrial fibrillation (HCC) 05/13/2016   PVC's (premature ventricular contractions) 05/25/2016   Holter 11/17 The basic rhythm is sinus. There are frequent PVCs, periods of ventricular bigeminy, and occasional ventricular couplets and triplets.   S/P AVR (aortic valve replacement)    Past Surgical History:  Procedure Laterality Date   AORTIC VALVE REPLACEMENT N/A 05/03/2016   Procedure: AORTIC VALVE REPLACEMENT (AVR) WITH MAGNA EASE PERICARDIAL BIOPROSTHESIS - AORTIC 21 MM MODEL 3300TFX;  Surgeon: [name], MD;  Location: [facility] OR;  Service: Open Heart Surgery;  Laterality: N/A;   ATRIAL FIBRILLATION ABLATION N/A 06/28/2018   Procedure: ATRIAL FIBRILLATION ABLATION;  Surgeon: Hillis Range, MD;  Location: [facility] INVASIVE CV LAB;  Service: Cardiovascular;  Laterality: N/A;   CARDIOVERSION N/A 02/16/2018   Procedure:  CARDIOVERSION;  Surgeon: Lars Masson, MD;  Location: [facility] ENDOSCOPY;  Service: Cardiovascular;  Laterality: N/A;   CARDIOVERSION N/A 08/02/2018   Procedure: CARDIOVERSION;  Surgeon: Chilton Si, MD;  Location: [facility] ENDOSCOPY;  Service: Cardiovascular;  Laterality: N/A;   EYE SURGERY  2005   Lasik   TEE WITHOUT CARDIOVERSION N/A 05/03/2016   Procedure: TRANSESOPHAGEAL ECHOCARDIOGRAM (TEE);  Surgeon: [name], MD;  Location: [facility] OR;  Service: Open Heart Surgery;  Laterality: N/A;   TONSILLECTOMY AND ADENOIDECTOMY      ROS- all systems are reviewed and negatives except as per HPI above  Current Outpatient Medications  Medication Sig Dispense Refill   acetaminophen (TYLENOL) 500 MG tablet Take 500-1,000 mg by mouth every 6 (six) hours as needed (for pain.).     amphetamine-dextroamphetamine (ADDERALL) 20 MG tablet daily.     apixaban (ELIQUIS) 5 MG TABS tablet Take 1 tablet (5 mg total) by mouth 2 (two) times daily. 60 tablet 5   Bioflavonoid Products (SUPER C-500 PO) Take 500 mg by mouth daily.     furosemide (LASIX) 20 MG tablet Take 20 mg by mouth daily as needed for fluid or edema.     LORazepam (ATIVAN) 0.5 MG tablet Take 0.5 mg by mouth daily as needed for anxiety.      MELATONIN PO Take 5 tablets by mouth at bedtime as needed (sleep).     metoprolol succinate (TOPROL XL) 25 MG 24 hr tablet Take 0.5 tablets (12.5 mg total) by mouth daily. 45 tablet 3   Potassium (POTASSIMIN PO) Take 99 mg by mouth  daily.     No current facility-administered medications for this visit.    Physical Exam: Vitals:   08/31/20 0849  BP: 104/72  Pulse: 64  SpO2: 95%  Weight: 157 lb (71.2 kg)  Height: 5\' 4"  (1.626 m)    GEN- The patient is well appearing, alert and oriented x 3 today.   Head- normocephalic, atraumatic Eyes-  Sclera clear, conjunctiva pink Ears- hearing intact Oropharynx- clear Lungs- Clear to ausculation bilaterally, normal work of breathing Heart- Regular rate and  rhythm, no murmurs, rubs or gallops, PMI not laterally displaced GI- soft, NT, ND, + BS Extremities- no clubbing, cyanosis, or edema  Wt Readings from Last 3 Encounters:  08/31/20 157 lb (71.2 kg)  07/21/20 151 lb 12.8 oz (68.9 kg)  07/02/20 149 lb 9.6 oz (67.9 kg)   Ekg-07/02/20- afib EKG tracing ordered today is personally reviewed and shows sinus rhythm, LAHB  Assessment and Plan:  Persistent atrial fibrillation The patient has symptomatic, recurrent  atrial fibrillation post ablation 2020.   Chads2vasc score is 3.  she is anticoagulated with eliquis . Therapeutic strategies for afib including medicine and ablation were discussed in detail with the patient today. Risk, benefits, and alternatives to EP study and radiofrequency ablation for afib were also discussed in detail today. These risks include but are not limited to stroke, bleeding, vascular damage, tamponade, perforation, damage to the esophagus, lungs, and other structures, pulmonary vein stenosis, worsening renal function, and death. The patient understands these risk and wishes to proceed.  We will therefore proceed with catheter ablation at the next available time.  Carto, ICE, anesthesia are requested for the procedure.  Will also obtain cardiac CT prior to the procedure to exclude LAA thrombus and further evaluate atrial anatomy.  2. Tachycardia mediated CM Improved with sinus from 20%-->50-55% post ablation  3. CRI, stage III Stable No change required today   Risks, benefits and potential toxicities for medications prescribed and/or refilled reviewed with patient today.   07/04/20 MD, [facility] 08/31/2020 8:54 AM

## 2020-09-22 ENCOUNTER — Telehealth: Payer: Self-pay | Admitting: Cardiovascular Disease

## 2020-09-22 NOTE — Telephone Encounter (Signed)
Wanda [name] is calling wanting to know if Dr. Excell Seltzer is still wanting to see her 08/01 due to her getting an ablation on 08/09. If she is unable to reschedule with Dr. Excell Seltzer she would like to keep the appt. Please advise.

## 2020-09-22 NOTE — Telephone Encounter (Signed)
Pt aware to keep appt 

## 2020-10-06 ENCOUNTER — Other Ambulatory Visit: Payer: Medicare PPO

## 2020-10-08 ENCOUNTER — Other Ambulatory Visit: Payer: Medicare PPO | Admitting: *Deleted

## 2020-10-08 ENCOUNTER — Other Ambulatory Visit: Payer: Self-pay

## 2020-10-08 ENCOUNTER — Other Ambulatory Visit: Payer: Medicare PPO

## 2020-10-08 DIAGNOSIS — I5022 Chronic systolic (congestive) heart failure: Secondary | ICD-10-CM

## 2020-10-08 DIAGNOSIS — I43 Cardiomyopathy in diseases classified elsewhere: Secondary | ICD-10-CM

## 2020-10-08 DIAGNOSIS — N289 Disorder of kidney and ureter, unspecified: Secondary | ICD-10-CM

## 2020-10-08 DIAGNOSIS — I4819 Other persistent atrial fibrillation: Secondary | ICD-10-CM

## 2020-10-09 ENCOUNTER — Telehealth (HOSPITAL_COMMUNITY): Payer: Self-pay | Admitting: Emergency Medicine

## 2020-10-09 LAB — COMPREHENSIVE METABOLIC PANEL
ALT: 9 IU/L (ref 0–32)
AST: 11 IU/L (ref 0–40)
Albumin/Globulin Ratio: 2 (ref 1.2–2.2)
Albumin: 4.8 g/dL (ref 3.8–4.8)
Alkaline Phosphatase: 61 IU/L (ref 44–121)
BUN/Creatinine Ratio: 16 (ref 12–28)
BUN: 19 mg/dL (ref 8–27)
Bilirubin Total: 0.4 mg/dL (ref 0.0–1.2)
CO2: 24 mmol/L (ref 20–29)
Calcium: 9.2 mg/dL (ref 8.7–10.3)
Chloride: 102 mmol/L (ref 96–106)
Creatinine, Ser: 1.21 mg/dL — ABNORMAL HIGH (ref 0.57–1.00)
Globulin, Total: 2.4 g/dL (ref 1.5–4.5)
Glucose: 103 mg/dL — ABNORMAL HIGH (ref 65–99)
Potassium: 4.6 mmol/L (ref 3.5–5.2)
Sodium: 142 mmol/L (ref 134–144)
Total Protein: 7.2 g/dL (ref 6.0–8.5)
eGFR: 49 mL/min/{1.73_m2} — ABNORMAL LOW (ref >59)

## 2020-10-09 LAB — CBC WITH DIFFERENTIAL/PLATELET
Basophils Absolute: 0.1 10*3/uL (ref 0.0–0.2)
Basos: 2 %
EOS (ABSOLUTE): 0.2 10*3/uL (ref 0.0–0.4)
Eos: 3 %
Hematocrit: 39.8 % (ref 34.0–46.6)
Hemoglobin: 13.7 g/dL (ref 11.1–15.9)
Immature Grans (Abs): 0 10*3/uL (ref 0.0–0.1)
Immature Granulocytes: 0 %
Lymphocytes Absolute: 1.7 10*3/uL (ref 0.7–3.1)
Lymphs: 33 %
MCH: 31.6 pg (ref 26.6–33.0)
MCHC: 34.4 g/dL (ref 31.5–35.7)
MCV: 92 fL (ref 79–97)
Monocytes Absolute: 0.5 10*3/uL (ref 0.1–0.9)
Monocytes: 9 %
Neutrophils Absolute: 2.8 10*3/uL (ref 1.4–7.0)
Neutrophils: 53 %
Platelets: 234 10*3/uL (ref 150–450)
RBC: 4.33 x10E6/uL (ref 3.77–5.28)
RDW: 11.7 % (ref 11.7–15.4)
WBC: 5.2 10*3/uL (ref 3.4–10.8)

## 2020-10-09 NOTE — Telephone Encounter (Signed)
Attempted to call patient regarding upcoming cardiac CT appointment. °Left message on voicemail with name and callback number °Harold Moncus RN Navigator Cardiac Imaging °[facility] °[phone] Office °[phone] Cell ° °

## 2020-10-12 ENCOUNTER — Other Ambulatory Visit: Payer: Self-pay

## 2020-10-12 ENCOUNTER — Ambulatory Visit: Payer: Medicare PPO | Admitting: Cardiovascular Disease

## 2020-10-12 ENCOUNTER — Encounter: Payer: Self-pay | Admitting: Cardiovascular Disease

## 2020-10-12 VITALS — BP 112/74 | HR 73 | Ht 64.0 in | Wt 155.8 lb

## 2020-10-12 DIAGNOSIS — I4819 Other persistent atrial fibrillation: Secondary | ICD-10-CM

## 2020-10-12 DIAGNOSIS — I359 Nonrheumatic aortic valve disorder, unspecified: Secondary | ICD-10-CM

## 2020-10-12 DIAGNOSIS — I5042 Chronic combined systolic (congestive) and diastolic (congestive) heart failure: Secondary | ICD-10-CM | POA: Diagnosis not present

## 2020-10-12 NOTE — Patient Instructions (Signed)
Medication Instructions:  Your physician recommends that you continue on your current medications as directed. Please refer to the Current Medication list given to you today.  *If you need a refill on your cardiac medications before your next appointment, please call your pharmacy*   Lab Work: None If you have labs (blood work) drawn today and your tests are completely normal, you will receive your results only by: MyChart Message (if you have MyChart) OR A paper copy in the mail If you have any lab test that is abnormal or we need to change your treatment, we will call you to review the results.   Testing/Procedures: Your physician has requested that you have an echocardiogram same day or a few days prior to seeing Dr. Excell Seltzer. Echocardiography is a painless test that uses sound waves to create images of your heart. It provides your doctor with information about the size and shape of your heart and how well your heart's chambers and valves are working. This procedure takes approximately one hour. There are no restrictions for this procedure.   Follow-Up: At [facility], you and your health needs are our priority.  As part of our continuing mission to provide you with exceptional heart care, we have created designated Provider Care Teams.  These Care Teams include your primary Cardiologist (physician) and Advanced Practice Providers (APPs -  Physician Assistants and Nurse Practitioners) who all work together to provide you with the care you need, when you need it.  We recommend signing up for the patient portal called "MyChart".  Sign up information is provided on this After Visit Summary.  MyChart is used to connect with patients for Virtual Visits (Telemedicine).  Patients are able to view lab/test results, encounter notes, upcoming appointments, etc.  Non-urgent messages can be sent to your provider as well.   To learn more about what you can do with MyChart, go to [email or web address].     Your next appointment:   6 month(s)  The format for your next appointment:   In Person  Provider:   You may see Tonny Bollman, MD or one of the following Advanced Practice Providers on your designated Care Team:   Tereso Newcomer, PA-C Chelsea Aus, [place]   Other Instructions

## 2020-10-12 NOTE — Progress Notes (Signed)
Cardiology Office Note:    Date:  10/12/2020   ID:  MICAL BRUN, DOB 1950/11/16, MRN [id number]  PCP:  [place], [name], MD   [facility] Providers Cardiologist:  Tonny Bollman, MD     Referring MD: [place], [name], MD   Chief Complaint  Patient presents with   Atrial Fibrillation    History of Present Illness:    Wanda [name] is a 70 y.o. female with a hx of aortic valve disease and atrial fibrillation, presenting for follow-up evaluation.  The patient underwent bioprosthetic aortic valve replacement in 2018 for treatment of severe symptomatic aortic insufficiency.  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  She was treated with atrial fibrillation in 2020 and she had a very good response to this.  Her LVEF improved to the 50 to 55% range.  She has had difficulty tolerating medical therapy including low-dose beta-blockade.  She was unable to tolerate Tikosyn because of QT prolongation.  She did well after her ablation until April 2022 when she developed recurrent atrial fibrillation.  She was going in and out of atrial fibrillation in April and May.  She has ultimately been evaluated in the atrial fibrillation clinic by Jorja Loa and again by Dr. Johney Frame who agreed that she would be best treated with repeat ablation.  The patient is here alone today.  She is feeling very well today.  She feels much better when she is in sinus rhythm and notes that she has been in sinus rhythm for the past couple of days.  Today, she denies symptoms of lightheadedness, heart palpitations, weakness, fatigue, dyspnea, or chest pain.  She has been taking metoprolol tartrate at bedtime and seems to tolerate this better than metoprolol succinate.  With metoprolol succinate she runs a heart rate into the 50s during the day and she feels very fatigued with this.  She has complained of marked fatigue and  exercise intolerance with her atrial fibrillation.  She frequently has heart rates ranging from 130 to 150 bpm when her heart is out of rhythm.  She is scheduled for atrial fibrillation ablation next week.  She is tolerating apixaban without bleeding problems.  Past Medical History:  Diagnosis Date   Anxiety    Arthritis 2018   hands have general aches   CHF (congestive heart failure) (HCC) 2018   has shortness of breath since CHF diagnosis and has increased recently; states this is reason for admission   Chronic systolic heart failure (HCC) 05/25/2016   tachycardia mediated,  resolved previously with sinus rhythm   Depression    Dyspnea 2018   Migraine    Nonischemic cardiomyopathy (HCC)    tachycardia mediated,  resolved previously with sinus rhythm   Persistent atrial fibrillation (HCC) 05/13/2016   PVC's (premature ventricular contractions) 05/25/2016   Holter 11/17 The basic rhythm is sinus. There are frequent PVCs, periods of ventricular bigeminy, and occasional ventricular couplets and triplets.   S/P AVR (aortic valve replacement)     Past Surgical History:  Procedure Laterality Date   AORTIC VALVE REPLACEMENT N/A 05/03/2016   Procedure: AORTIC VALVE REPLACEMENT (AVR) WITH MAGNA EASE PERICARDIAL BIOPROSTHESIS - AORTIC 21 MM MODEL 3300TFX;  Surgeon: [name], MD;  Location: [facility] OR;  Service: Open Heart Surgery;  Laterality: N/A;   ATRIAL FIBRILLATION ABLATION N/A 06/28/2018   Procedure: ATRIAL FIBRILLATION ABLATION;  Surgeon: Hillis Range, MD;  Location: [facility]  INVASIVE CV LAB;  Service: Cardiovascular;  Laterality: N/A;   CARDIOVERSION N/A 02/16/2018   Procedure: CARDIOVERSION;  Surgeon: Lars Masson, MD;  Location: [facility] ENDOSCOPY;  Service: Cardiovascular;  Laterality: N/A;   CARDIOVERSION N/A 08/02/2018   Procedure: CARDIOVERSION;  Surgeon: Chilton Si, MD;  Location: [facility] ENDOSCOPY;  Service: Cardiovascular;  Laterality: N/A;   EYE SURGERY  2005   Lasik   TEE  WITHOUT CARDIOVERSION N/A 05/03/2016   Procedure: TRANSESOPHAGEAL ECHOCARDIOGRAM (TEE);  Surgeon: [name], MD;  Location: [facility] OR;  Service: Open Heart Surgery;  Laterality: N/A;   TONSILLECTOMY AND ADENOIDECTOMY      Current Medications: Current Meds  Medication Sig   acetaminophen (TYLENOL) 500 MG tablet Take 500-1,000 mg by mouth every 6 (six) hours as needed (for pain.).   apixaban (ELIQUIS) 5 MG TABS tablet Take 1 tablet (5 mg total) by mouth 2 (two) times daily.   Bioflavonoid Products (SUPER C-500 PO) Take 500 mg by mouth daily.   furosemide (LASIX) 20 MG tablet Take 20 mg by mouth daily as needed for fluid or edema.   LORazepam (ATIVAN) 0.5 MG tablet Take 0.5 mg by mouth daily as needed for anxiety.    MELATONIN PO Take 5 tablets by mouth at bedtime as needed (sleep).   metoprolol tartrate (LOPRESSOR) 25 MG tablet Take 25 mg by mouth at bedtime.   Potassium (POTASSIMIN PO) Take 99 mg by mouth daily.     Allergies:   Tikosyn [dofetilide], Codeine, and Vicodin [hydrocodone-acetaminophen]   Social History   Socioeconomic History   Marital status: Married    Spouse name: Not on file   Number of children: Not on file   Years of education: Not on file   Highest education level: Not on file  Occupational History   Not on file  Tobacco Use   Smoking status: Former   Smokeless tobacco: Never   Tobacco comments:    stopped in 70's while in college; smoked 1-2 years  Vaping Use   Vaping Use: Never used  Substance and Sexual Activity   Alcohol use: Yes    Alcohol/week: 1.0 - 2.0 standard drink    Types: 1 - 2 Glasses of wine per week    Comment: 1-2 glasses of wine; hasn't had wine for last month   Drug use: No   Sexual activity: Yes    Partners: Male    Birth control/protection: Other-see comments, Post-menopausal    Comment: vasectomy  Other Topics Concern   Not on file  Social History Narrative   Lives in [place]   retired   Social Determinants of Manufacturing engineer Strain: Not on file  Food Insecurity: Not on file  Transportation Needs: Not on file  Physical Activity: Not on file  Stress: Not on file  Social Connections: Not on file     Family History: The patient's family history includes Breast cancer (age of onset: 72) in her sister; Congestive Heart Failure in her maternal grandfather; Deep vein thrombosis in her brother, father, paternal aunt, and paternal grandmother; Diabetes in her mother; Heart disease in her brother; Lung cancer in her brother; Ovarian cancer (age of onset: 35) in her maternal grandmother.  ROS:   Please see the history of present illness.    All other systems reviewed and are negative.  EKGs/Labs/Other Studies Reviewed:    The following studies were reviewed today: Echo 12.17.2021:  1. Left ventricular ejection fraction, by estimation, is 50 to 55%. The  left ventricle has low normal function. The left ventricle has no regional  wall motion abnormalities. There is mild left ventricular hypertrophy.  Left ventricular diastolic  parameters are consistent with Grade I diastolic dysfunction (impaired  relaxation).   2. Right ventricular systolic function is normal. The right ventricular  size is normal. Tricuspid regurgitation signal is inadequate for assessing  PA pressure.   3. The mitral valve is grossly normal. Mild to moderate mitral valve  regurgitation.   4. The aortic valve has been repaired/replaced. There is mild thickening  of the aortic valve. Aortic valve regurgitation is trivial. There is a 21  mm Edwards pericardial valve present in the aortic position. Procedure  Date: 05/03/2016. Aortic valve mean  gradient measures 13.0 mmHg.   5. The inferior vena cava is normal in size with greater than 50%  respiratory variability, suggesting right atrial pressure of 3 mmHg.   EKG:  EKG is not ordered today.    Recent Labs: 10/08/2020: ALT 9; BUN 19; Creatinine, Ser 1.21; Hemoglobin 13.7;  Platelets 234; Potassium 4.6; Sodium 142  Recent Lipid Panel    Component Value Date/Time   CHOL 247 (H) 08/18/2015 1600   TRIG 73 08/18/2015 1600   HDL 108 08/18/2015 1600   CHOLHDL 2.3 08/18/2015 1600   VLDL 15 08/18/2015 1600   LDLCALC 124 08/18/2015 1600     Risk Assessment/Calculations:    CHA2DS2-VASc Score = 3  This indicates a 3.2% annual risk of stroke. The patient's score is based upon: CHF History: Yes HTN History: No Diabetes History: No Stroke History: No Vascular Disease History: No Age Score: 1 Gender Score: 1          Physical Exam:    VS:  BP 112/74   Pulse 73   Ht 5\' 4"  (1.626 m)   Wt 155 lb 12.8 oz (70.7 kg)   LMP 08/12/2004   SpO2 96%   BMI 26.74 kg/m     Wt Readings from Last 3 Encounters:  10/12/20 155 lb 12.8 oz (70.7 kg)  08/31/20 157 lb (71.2 kg)  07/21/20 151 lb 12.8 oz (68.9 kg)     GEN:  Well nourished, well developed in no acute distress HEENT: Normal NECK: No JVD; bilateral carotid bruits/transmitted murmur LYMPHATICS: No lymphadenopathy CARDIAC: RRR, 2/6 systolic murmur at the right upper sternal border, no diastolic murmur RESPIRATORY:  Clear to auscultation without rales, wheezing or rhonchi  ABDOMEN: Soft, non-tender, non-distended MUSCULOSKELETAL:  No edema; No deformity  SKIN: Warm and dry NEUROLOGIC:  Alert and oriented x 3 PSYCHIATRIC:  Normal affect   ASSESSMENT:    1. Persistent atrial fibrillation (HCC)   2. Chronic combined systolic and diastolic heart failure (HCC)   3. Aortic valve disease    PLAN:    In order of problems listed above:  Scheduled for A. fib ablation next week.  Questions answered today.  She will continue on metoprolol tartrate at bedtime.  She is tolerating apixaban and understands that she will be contacted by Dr. 09/20/20 nurse with periprocedural instructions regarding her apixaban. Stable at present.  Most recent echocardiogram from December 2021 reviewed demonstrating normal LV  systolic function.  She has done well as long as she is in sinus rhythm.  She is not able to tolerate much therapy for heart failure because of symptomatic hypotension. The patient has had normal function of her bioprosthetic heart valve.  We will arrange a follow-up echocardiogram when she returns in 6 months.  SBE prophylaxis is  indicated.        Medication Adjustments/Labs and Tests Ordered: Current medicines are reviewed at length with the patient today.  Concerns regarding medicines are outlined above.  Orders Placed This Encounter  Procedures   ECHOCARDIOGRAM COMPLETE    No orders of the defined types were placed in this encounter.   Patient Instructions  Medication Instructions:  Your physician recommends that you continue on your current medications as directed. Please refer to the Current Medication list given to you today.  *If you need a refill on your cardiac medications before your next appointment, please call your pharmacy*   Lab Work: None If you have labs (blood work) drawn today and your tests are completely normal, you will receive your results only by: MyChart Message (if you have MyChart) OR A paper copy in the mail If you have any lab test that is abnormal or we need to change your treatment, we will call you to review the results.   Testing/Procedures: Your physician has requested that you have an echocardiogram same day or a few days prior to seeing Dr. Excell Seltzer. Echocardiography is a painless test that uses sound waves to create images of your heart. It provides your doctor with information about the size and shape of your heart and how well your heart's chambers and valves are working. This procedure takes approximately one hour. There are no restrictions for this procedure.   Follow-Up: At [facility], you and your health needs are our priority.  As part of our continuing mission to provide you with exceptional heart care, we have created designated  Provider Care Teams.  These Care Teams include your primary Cardiologist (physician) and Advanced Practice Providers (APPs -  Physician Assistants and Nurse Practitioners) who all work together to provide you with the care you need, when you need it.  We recommend signing up for the patient portal called "MyChart".  Sign up information is provided on this After Visit Summary.  MyChart is used to connect with patients for Virtual Visits (Telemedicine).  Patients are able to view lab/test results, encounter notes, upcoming appointments, etc.  Non-urgent messages can be sent to your provider as well.   To learn more about what you can do with MyChart, go to [email or web address].    Your next appointment:   6 month(s)  The format for your next appointment:   In Person  Provider:   You may see Tonny Bollman, MD or one of the following Advanced Practice Providers on your designated Care Team:   Tereso Newcomer, PA-C Chelsea Aus, [place]   Other Instructions     Signed, Tonny Bollman, MD  10/12/2020 5:23 PM    [facility]

## 2020-10-14 ENCOUNTER — Ambulatory Visit (HOSPITAL_COMMUNITY)
Admission: RE | Admit: 2020-10-14 | Discharge: 2020-10-14 | Disposition: A | Discharge status: Home / Self Care | Payer: Medicare PPO | Source: Ambulatory Visit | Attending: Internal Medicine | Admitting: Internal Medicine

## 2020-10-14 ENCOUNTER — Other Ambulatory Visit: Payer: Self-pay

## 2020-10-14 DIAGNOSIS — I4819 Other persistent atrial fibrillation: Secondary | ICD-10-CM

## 2020-10-14 MED ORDER — IOHEXOL 350 MG/ML SOLN
80.0000 mL | Freq: Once | INTRAVENOUS | Status: AC | PRN
  Administered 2020-10-14: 80 mL via INTRAVENOUS

## 2020-10-20 ENCOUNTER — Ambulatory Visit (HOSPITAL_COMMUNITY): Payer: Medicare PPO | Admitting: Certified Registered"

## 2020-10-20 ENCOUNTER — Encounter (HOSPITAL_COMMUNITY)
Admission: RE | Disposition: A | Discharge status: Home / Self Care | Payer: Self-pay | Source: Home / Self Care | Attending: Internal Medicine

## 2020-10-20 ENCOUNTER — Other Ambulatory Visit: Payer: Self-pay

## 2020-10-20 ENCOUNTER — Encounter (HOSPITAL_COMMUNITY): Payer: Self-pay | Admitting: Internal Medicine

## 2020-10-20 ENCOUNTER — Ambulatory Visit (HOSPITAL_COMMUNITY)
Admission: RE | Admit: 2020-10-20 | Discharge: 2020-10-20 | Disposition: A | Discharge status: Home / Self Care | Payer: Medicare PPO | Attending: Internal Medicine | Admitting: Internal Medicine

## 2020-10-20 DIAGNOSIS — I4892 Unspecified atrial flutter: Secondary | ICD-10-CM | POA: Diagnosis not present

## 2020-10-20 DIAGNOSIS — Z79899 Other long term (current) drug therapy: Secondary | ICD-10-CM | POA: Insufficient documentation

## 2020-10-20 DIAGNOSIS — Z952 Presence of prosthetic heart valve: Secondary | ICD-10-CM | POA: Diagnosis not present

## 2020-10-20 DIAGNOSIS — Z7901 Long term (current) use of anticoagulants: Secondary | ICD-10-CM | POA: Diagnosis not present

## 2020-10-20 DIAGNOSIS — I428 Other cardiomyopathies: Secondary | ICD-10-CM | POA: Insufficient documentation

## 2020-10-20 DIAGNOSIS — I4819 Other persistent atrial fibrillation: Secondary | ICD-10-CM | POA: Insufficient documentation

## 2020-10-20 DIAGNOSIS — I5022 Chronic systolic (congestive) heart failure: Secondary | ICD-10-CM | POA: Insufficient documentation

## 2020-10-20 HISTORY — PX: ATRIAL FIBRILLATION ABLATION: EP1191

## 2020-10-20 LAB — POCT ACTIVATED CLOTTING TIME
Activated Clotting Time: 300 seconds
Activated Clotting Time: 341 seconds

## 2020-10-20 SURGERY — ATRIAL FIBRILLATION ABLATION
Anesthesia: General

## 2020-10-20 MED ORDER — PROPOFOL 10 MG/ML IV BOLUS
INTRAVENOUS | Status: DC | PRN
Start: 2020-10-20 — End: 2020-10-20
  Administered 2020-10-20: 140 mg via INTRAVENOUS

## 2020-10-20 MED ORDER — HEPARIN SODIUM (PORCINE) 1000 UNIT/ML IJ SOLN
INTRAMUSCULAR | Status: AC
  Filled 2020-10-20: qty 1

## 2020-10-20 MED ORDER — PROMETHAZINE HCL 25 MG/ML IJ SOLN
6.2500 mg | INTRAMUSCULAR | Status: DC | PRN
  Filled 2020-10-20: qty 1

## 2020-10-20 MED ORDER — APIXABAN 5 MG PO TABS
5.0000 mg | ORAL_TABLET | Freq: Once | ORAL | Status: AC
  Administered 2020-10-20: 5 mg via ORAL
  Filled 2020-10-20: qty 1

## 2020-10-20 MED ORDER — SODIUM CHLORIDE 0.9% FLUSH: 3.0000 mL | Freq: Two times a day (BID) | INTRAVENOUS | Status: DC

## 2020-10-20 MED ORDER — SUGAMMADEX SODIUM 200 MG/2ML IV SOLN
INTRAVENOUS | Status: DC | PRN
  Administered 2020-10-20: 140 mg via INTRAVENOUS

## 2020-10-20 MED ORDER — DEXAMETHASONE SODIUM PHOSPHATE 10 MG/ML IJ SOLN
INTRAMUSCULAR | Status: DC | PRN
  Administered 2020-10-20: 8 mg via INTRAVENOUS

## 2020-10-20 MED ORDER — MIDAZOLAM HCL 2 MG/2ML IJ SOLN
INTRAMUSCULAR | Status: DC | PRN
  Administered 2020-10-20: 2 mg via INTRAVENOUS

## 2020-10-20 MED ORDER — HEPARIN (PORCINE) IN NACL 1000-0.9 UT/500ML-% IV SOLN
INTRAVENOUS | Status: AC
  Filled 2020-10-20: qty 500

## 2020-10-20 MED ORDER — HEPARIN SODIUM (PORCINE) 1000 UNIT/ML IJ SOLN
INTRAMUSCULAR | Status: DC | PRN
  Administered 2020-10-20: 1000 [IU] via INTRAVENOUS
  Administered 2020-10-20: 15000 [IU] via INTRAVENOUS

## 2020-10-20 MED ORDER — SODIUM CHLORIDE 0.9 % IV SOLN
12.5000 mg | Freq: Once | INTRAVENOUS | Status: AC
  Administered 2020-10-20: 12.5 mg via INTRAVENOUS
  Filled 2020-10-20: qty 0.5

## 2020-10-20 MED ORDER — ONDANSETRON HCL 4 MG/2ML IJ SOLN
4.0000 mg | Freq: Four times a day (QID) | INTRAMUSCULAR | Status: DC | PRN
  Administered 2020-10-20: 4 mg via INTRAVENOUS

## 2020-10-20 MED ORDER — HEPARIN SODIUM (PORCINE) 1000 UNIT/ML IJ SOLN
INTRAMUSCULAR | Status: DC | PRN
  Administered 2020-10-20: 2000 [IU] via INTRAVENOUS
  Administered 2020-10-20: 3000 [IU] via INTRAVENOUS

## 2020-10-20 MED ORDER — FENTANYL CITRATE (PF) 100 MCG/2ML IJ SOLN
INTRAMUSCULAR | Status: DC | PRN
  Administered 2020-10-20 (×2): 50 ug via INTRAVENOUS

## 2020-10-20 MED ORDER — ROCURONIUM BROMIDE 10 MG/ML (PF) SYRINGE
PREFILLED_SYRINGE | INTRAVENOUS | Status: DC | PRN
  Administered 2020-10-20: 70 mg via INTRAVENOUS
  Administered 2020-10-20: 20 mg via INTRAVENOUS
  Administered 2020-10-20: 10 mg via INTRAVENOUS

## 2020-10-20 MED ORDER — FENTANYL CITRATE (PF) 100 MCG/2ML IJ SOLN
12.5000 ug | Freq: Once | INTRAMUSCULAR | Status: AC
  Administered 2020-10-20: 12.5 ug via INTRAVENOUS

## 2020-10-20 MED ORDER — PHENYLEPHRINE HCL-NACL 20-0.9 MG/250ML-% IV SOLN
INTRAVENOUS | Status: DC | PRN
  Administered 2020-10-20: 25 ug/min via INTRAVENOUS

## 2020-10-20 MED ORDER — FENTANYL CITRATE (PF) 100 MCG/2ML IJ SOLN
INTRAMUSCULAR | Status: AC
  Filled 2020-10-20: qty 2

## 2020-10-20 MED ORDER — AMISULPRIDE (ANTIEMETIC) 5 MG/2ML IV SOLN
10.0000 mg | Freq: Once | INTRAVENOUS | Status: AC | PRN
  Administered 2020-10-20: 10 mg via INTRAVENOUS
  Filled 2020-10-20: qty 4

## 2020-10-20 MED ORDER — ONDANSETRON HCL 4 MG/2ML IJ SOLN
INTRAMUSCULAR | Status: DC | PRN
  Administered 2020-10-20: 4 mg via INTRAVENOUS

## 2020-10-20 MED ORDER — PROTAMINE SULFATE 10 MG/ML IV SOLN
INTRAVENOUS | Status: DC | PRN
  Administered 2020-10-20: 40 mg via INTRAVENOUS

## 2020-10-20 MED ORDER — SODIUM CHLORIDE 0.9 % IV SOLN: INTRAVENOUS | Status: DC

## 2020-10-20 MED ORDER — LIDOCAINE 2% (20 MG/ML) 5 ML SYRINGE
INTRAMUSCULAR | Status: DC | PRN
  Administered 2020-10-20: 70 mg via INTRAVENOUS

## 2020-10-20 MED ORDER — HEPARIN (PORCINE) IN NACL 1000-0.9 UT/500ML-% IV SOLN
INTRAVENOUS | Status: DC | PRN
  Administered 2020-10-20 (×2): 500 mL

## 2020-10-20 MED ORDER — ACETAMINOPHEN 325 MG PO TABS
650.0000 mg | ORAL_TABLET | ORAL | Status: DC | PRN
  Filled 2020-10-20: qty 2

## 2020-10-20 MED ORDER — ONDANSETRON HCL 4 MG/2ML IJ SOLN
INTRAMUSCULAR | Status: AC
  Filled 2020-10-20: qty 2

## 2020-10-20 MED ORDER — PANTOPRAZOLE SODIUM 40 MG PO TBEC: 40.0000 mg | DELAYED_RELEASE_TABLET | Freq: Every day | ORAL | 0 refills | Status: DC

## 2020-10-20 MED ORDER — SODIUM CHLORIDE 0.9 % IV SOLN: 250.0000 mL | INTRAVENOUS | Status: DC | PRN

## 2020-10-20 MED ORDER — SODIUM CHLORIDE 0.9% FLUSH: 3.0000 mL | INTRAVENOUS | Status: DC | PRN

## 2020-10-20 SURGICAL SUPPLY — 16 items
BLANKET WARM UNDERBOD FULL ACC (MISCELLANEOUS) ×2 IMPLANT
CATH OCTARAY 2.0 F 3-3-3-3-3 (CATHETERS) ×2 IMPLANT
CATH SMTCH THERMOCOOL SF DF (CATHETERS) ×1 IMPLANT
CATH SOUNDSTAR ECO 8FR (CATHETERS) ×1 IMPLANT
CATH WEBSTER BI DIR CS D-F CRV (CATHETERS) ×1 IMPLANT
CLOSURE PERCLOSE PROSTYLE (VASCULAR PRODUCTS) ×4 IMPLANT
COVER SWIFTLINK CONNECTOR (BAG) ×2 IMPLANT
NEEDLE BAYLIS TRANSSEPTAL 71CM (NEEDLE) ×2 IMPLANT
PACK EP LATEX FREE (CUSTOM PROCEDURE TRAY) ×2
PACK EP LF (CUSTOM PROCEDURE TRAY) ×1 IMPLANT
PAD PRO RADIOLUCENT 2001M-C (PAD) ×2 IMPLANT
SHEATH PINNACLE 7F 10CM (SHEATH) ×4 IMPLANT
SHEATH PINNACLE 9F 10CM (SHEATH) ×1 IMPLANT
SHEATH PROBE COVER 6X72 (BAG) ×2 IMPLANT
SHEATH TORFLEX 8.5FR 45 (SHEATH) ×1 IMPLANT
TUBING SMART ABLATE COOLFLOW (TUBING) ×2 IMPLANT

## 2020-10-20 NOTE — H&P (Signed)
PCP: [place], [name], MD Primary Cardiologist: Dr Excell Seltzer Primary EP: Dr Danielle Rankin Wanda [name] is a 70 y.o. female who presents today for electrophysiology study and ablation for afib.  Since last being seen in our clinic, the patient reports doing reasonably well.  Her AF has returned.  + increasing frequency and duration.  Trigger is concern/ heartache regarding her son.  + palpitations and fatigue during AF.  Today, she denies symptoms of chest pain, shortness of breath,  lower extremity edema, dizziness, presyncope, or syncope.  The patient is otherwise without complaint today.        Past Medical History:  Diagnosis Date   Anxiety     Arthritis 2018    hands have general aches   CHF (congestive heart failure) (HCC) 2018    has shortness of breath since CHF diagnosis and has increased recently; states this is reason for admission   Chronic systolic heart failure (HCC) 05/25/2016    tachycardia mediated,  resolved previously with sinus rhythm   Depression     Dyspnea 2018   Migraine     Nonischemic cardiomyopathy (HCC)      tachycardia mediated,  resolved previously with sinus rhythm   Persistent atrial fibrillation (HCC) 05/13/2016   PVC's (premature ventricular contractions) 05/25/2016    Holter 11/17 The basic rhythm is sinus. There are frequent PVCs, periods of ventricular bigeminy, and occasional ventricular couplets and triplets.   S/P AVR (aortic valve replacement)           Past Surgical History:  Procedure Laterality Date   AORTIC VALVE REPLACEMENT N/A 05/03/2016    Procedure: AORTIC VALVE REPLACEMENT (AVR) WITH MAGNA EASE PERICARDIAL BIOPROSTHESIS - AORTIC 21 MM MODEL 3300TFX;  Surgeon: [name], MD;  Location: [facility] OR;  Service: Open Heart Surgery;  Laterality: N/A;   ATRIAL FIBRILLATION ABLATION N/A 06/28/2018    Procedure: ATRIAL FIBRILLATION ABLATION;  Surgeon: Hillis Range, MD;  Location: [facility] INVASIVE CV LAB;  Service: Cardiovascular;  Laterality: N/A;    CARDIOVERSION N/A 02/16/2018    Procedure: CARDIOVERSION;  Surgeon: Lars Masson, MD;  Location: [facility] ENDOSCOPY;  Service: Cardiovascular;  Laterality: N/A;   CARDIOVERSION N/A 08/02/2018    Procedure: CARDIOVERSION;  Surgeon: Chilton Si, MD;  Location: [facility] ENDOSCOPY;  Service: Cardiovascular;  Laterality: N/A;   EYE SURGERY   2005    Lasik   TEE WITHOUT CARDIOVERSION N/A 05/03/2016    Procedure: TRANSESOPHAGEAL ECHOCARDIOGRAM (TEE);  Surgeon: [name], MD;  Location: [facility] OR;  Service: Open Heart Surgery;  Laterality: N/A;   TONSILLECTOMY AND ADENOIDECTOMY          ROS- all systems are reviewed and negatives except as per HPI above         Current Outpatient Medications  Medication Sig Dispense Refill   acetaminophen (TYLENOL) 500 MG tablet Take 500-1,000 mg by mouth every 6 (six) hours as needed (for pain.).       amphetamine-dextroamphetamine (ADDERALL) 20 MG tablet daily.       apixaban (ELIQUIS) 5 MG TABS tablet Take 1 tablet (5 mg total) by mouth 2 (two) times daily. 60 tablet 5   Bioflavonoid Products (SUPER C-500 PO) Take 500 mg by mouth daily.       furosemide (LASIX) 20 MG tablet Take 20 mg by mouth daily as needed for fluid or edema.       LORazepam (ATIVAN) 0.5 MG tablet Take 0.5 mg by mouth daily as needed for anxiety.  MELATONIN PO Take 5 tablets by mouth at bedtime as needed (sleep).       metoprolol succinate (TOPROL XL) 25 MG 24 hr tablet Take 0.5 tablets (12.5 mg total) by mouth daily. 45 tablet 3   Potassium (POTASSIMIN PO) Take 99 mg by mouth daily.        No current facility-administered medications for this visit.      Physical Exam: Vitals:   10/20/20 0621  BP: (!) 129/98  Pulse: (!) 108  Resp: 17  Temp: 98.4 F (36.9 C)  SpO2: 97%     GEN- The patient is well appearing, alert and oriented x 3 today.   Head- normocephalic, atraumatic Eyes-  Sclera clear, conjunctiva pink Ears- hearing intact Oropharynx- clear Lungs- Clear to  ausculation bilaterally, normal work of breathing Heart- Regular rate and rhythm, no murmurs, rubs or gallops, PMI not laterally displaced GI- soft, NT, ND, + BS Extremities- no clubbing, cyanosis, or edema     Assessment and Plan:   Persistent atrial fibrillation The patient has symptomatic, recurrent  atrial fibrillation post ablation 2020.   Chads2vasc score is 3.  she is anticoagulated with eliquis .   Risk, benefits, and alternatives to EP study and radiofrequency ablation for afib were again discussed in detail today. These risks include but are not limited to stroke, bleeding, vascular damage, tamponade, perforation, damage to the esophagus, lungs, and other structures, pulmonary vein stenosis, worsening renal function, and death. The patient understands these risk and wishes to proceed.    Cardiac CT reviewed at length with the patient today.  she reports compliance with OAC without interruption.  Hillis Range MD, [facility] 10/20/2020 7:18 AM

## 2020-10-20 NOTE — Anesthesia Preprocedure Evaluation (Signed)
Anesthesia Evaluation  Patient identified by MRN, date of birth, ID band Patient awake    Reviewed: Allergy & Precautions, NPO status , Patient's Chart, lab work & pertinent test results  Airway Mallampati: II  TM Distance: >3 FB Neck ROM: Full    Dental no notable dental hx.    Pulmonary neg pulmonary ROS, former smoker,    Pulmonary exam normal breath sounds clear to auscultation       Cardiovascular + dysrhythmias Atrial Fibrillation  Rhythm:Irregular Rate:Normal + Systolic murmurs S/p AVR   Neuro/Psych  Headaches, Anxiety Depression negative psych ROS   GI/Hepatic negative GI ROS, Neg liver ROS,   Endo/Other  negative endocrine ROS  Renal/GU Renal InsufficiencyRenal disease  negative genitourinary   Musculoskeletal  (+) Arthritis , Osteoarthritis,    Abdominal   Peds negative pediatric ROS (+)  Hematology negative hematology ROS (+)   Anesthesia Other Findings   Reproductive/Obstetrics negative OB ROS                             Anesthesia Physical  Anesthesia Plan  ASA: III  Anesthesia Plan: General   Post-op Pain Management:    Induction: Intravenous  PONV Risk Score and Plan: 3 and Treatment may vary due to age or medical condition, Ondansetron, Dexamethasone and Midazolam  Airway Management Planned: Oral ETT  Additional Equipment:   Intra-op Plan:   Post-operative Plan: Extubation in OR  Informed Consent: I have reviewed the patients History and Physical, chart, labs and discussed the procedure including the risks, benefits and alternatives for the proposed anesthesia with the patient or authorized representative who has indicated his/her understanding and acceptance.     Dental advisory given  Plan Discussed with: CRNA and Surgeon  Anesthesia Plan Comments:         Anesthesia Quick Evaluation

## 2020-10-20 NOTE — Progress Notes (Signed)
Still nauseated after 2nd Zofran. C/O chest pressure; Dr. Johney Frame made aware. Medicated for pain; Repeat 12 lead EKG done. IVPB Phenergan infusing; warm and dry; pink; HR and BP stable

## 2020-10-20 NOTE — Anesthesia Procedure Notes (Signed)

## 2020-10-20 NOTE — Progress Notes (Signed)
Remains nauseated off and on. Requested Barhemsys IV from pharm

## 2020-10-20 NOTE — Anesthesia Postprocedure Evaluation (Signed)
Anesthesia Post Note  Patient: Wanda [name]  Procedure(s) Performed: ATRIAL FIBRILLATION ABLATION     Patient location during evaluation: PACU Anesthesia Type: General Level of consciousness: awake and alert Pain management: pain level controlled Vital Signs Assessment: post-procedure vital signs reviewed and stable Respiratory status: spontaneous breathing, nonlabored ventilation and respiratory function stable Cardiovascular status: blood pressure returned to baseline and stable Postop Assessment: no apparent nausea or vomiting Anesthetic complications: no   No notable events documented.  Last Vitals:  Vitals:   10/20/20 1110 10/20/20 1115  BP: 120/80 119/76  Pulse: 75 78  Resp: 11 11  Temp:    SpO2: 90% 98%    Last Pain:  Vitals:   10/20/20 1118  TempSrc:   PainSc: 4                  Lowella Curb

## 2020-10-20 NOTE — Transfer of Care (Signed)
Immediate Anesthesia Transfer of Care Note  Patient: Wanda [name]  Procedure(s) Performed: ATRIAL FIBRILLATION ABLATION  Patient Location: Cath Lab  Anesthesia Type:General  Level of Consciousness: drowsy and patient cooperative  Airway & Oxygen Therapy: Patient Spontanous Breathing and Patient connected to nasal cannula oxygen  Post-op Assessment: Report given to RN  Post vital signs: Reviewed and stable  Last Vitals:  Vitals Value Taken Time  BP 126/77 10/20/20 1010  Temp 36.4 C 10/20/20 1011  Pulse 80 10/20/20 1013  Resp 7 10/20/20 1013  SpO2 100 % 10/20/20 1013  Vitals shown include unvalidated device data.  Last Pain:  Vitals:   10/20/20 1011  TempSrc: Temporal  PainSc: Asleep         Complications: No notable events documented.

## 2020-10-20 NOTE — Discharge Instructions (Addendum)
Post procedure care instructions No driving for 4 days. No lifting over 5 lbs for 1 week. No vigorous or sexual activity for 1 week. You may return to work/your usual activities on 10/28/20. Keep procedure site clean & dry. If you notice increased pain, swelling, bleeding or pus, call/return!  You may shower after 24 hours, but no soaking in baths/hot tubs/pools for 1 week.    You have an appointment set up with the Atrial Fibrillation Clinic.  Multiple studies have shown that being followed by a dedicated atrial fibrillation clinic in addition to the standard care you receive from your other physicians improves health. We believe that enrollment in the atrial fibrillation clinic will allow [place] to better care for you.   The phone number to the Atrial Fibrillation Clinic is [phone]. The clinic is staffed Monday through Friday from 8:30am to 5pm.  Parking Directions: The clinic is located in the Heart and Vascular Building connected to [facility]. 1)From [address] turn on to [address] and go to the 3rd entrance  (Heart and Vascular entrance) on the right. 2)Look to the right for Heart &Vascular Parking Garage. 3)A code for the entrance is required, for September is 4455.   4)Take the elevators to the 1st floor. Registration is in the room with the glass walls at the end of the hallway.  If you have any trouble parking or locating the clinic, please don't hesitate to call [phone].   Cardiac Ablation, Care After  This sheet gives you information about how to care for yourself after your procedure. Your health care provider may also give you more specific instructions. If you have problems or questions, contact your health care provider. What can I expect after the procedure? After the procedure, it is common to have: Bruising around your puncture site. Tenderness around your puncture site. Skipped heartbeats. Tiredness (fatigue).  Follow these instructions at  home: Puncture site care  Follow instructions from your health care provider about how to take care of your puncture site. Make sure you: If present, leave stitches (sutures), skin glue, or adhesive strips in place. These skin closures may need to stay in place for up to 2 weeks. If adhesive strip edges start to loosen and curl up, you may trim the loose edges. Do not remove adhesive strips completely unless your health care provider tells you to do that. If a large square bandage is present, this may be removed 24 hours after surgery.  Check your puncture site every day for signs of infection. Check for: Redness, swelling, or pain. Fluid or blood. If your puncture site starts to bleed, lie down on your back, apply firm pressure to the area, and contact your health care provider. Warmth. Pus or a bad smell. Driving Do not drive for at least 4 days after your procedure or however long your health care provider recommends. (Do not resume driving if you have previously been instructed not to drive for other health reasons.) Do not drive or use heavy machinery while taking prescription pain medicine. Activity Avoid activities that take a lot of effort for at least 7 days after your procedure. Do not lift anything that is heavier than 5 lb (4.5 kg) for one week.  No sexual activity for 1 week.  Return to your normal activities as told by your health care provider. Ask your health care provider what activities are safe for you. General instructions Take over-the-counter and prescription medicines only as told by your health care  provider. Do not use any products that contain nicotine or tobacco, such as cigarettes and e-cigarettes. If you need help quitting, ask your health care provider. You may shower after 24 hours, but Do not take baths, swim, or use a hot tub for 1 week.  Do not drink alcohol for 24 hours after your procedure. Keep all follow-up visits as told by your health care provider. This  is important. Contact a health care provider if: You have redness, mild swelling, or pain around your puncture site. You have fluid or blood coming from your puncture site that stops after applying firm pressure to the area. Your puncture site feels warm to the touch. You have pus or a bad smell coming from your puncture site. You have a fever. You have chest pain or discomfort that spreads to your neck, jaw, or arm. You are sweating a lot. You feel nauseous. You have a fast or irregular heartbeat. You have shortness of breath. You are dizzy or light-headed and feel the need to lie down. You have pain or numbness in the arm or leg closest to your puncture site. Get help right away if: Your puncture site suddenly swells. Your puncture site is bleeding and the bleeding does not stop after applying firm pressure to the area. These symptoms may represent a serious problem that is an emergency. Do not wait to see if the symptoms will go away. Get medical help right away. Call your local emergency services (911 in the [place] Do not drive yourself to the hospital. Summary After the procedure, it is normal to have bruising and tenderness at the puncture site in your groin, neck, or forearm. Check your puncture site every day for signs of infection. Get help right away if your puncture site is bleeding and the bleeding does not stop after applying firm pressure to the area. This is a medical emergency. This information is not intended to replace advice given to you by your health care provider. Make sure you discuss any questions you have with your health care provider.

## 2020-11-09 ENCOUNTER — Other Ambulatory Visit: Payer: Self-pay | Admitting: Cardiovascular Disease

## 2020-11-17 ENCOUNTER — Ambulatory Visit (HOSPITAL_COMMUNITY)
Admission: RE | Admit: 2020-11-17 | Discharge: 2020-11-17 | Disposition: A | Discharge status: Home / Self Care | Payer: Medicare PPO | Source: Ambulatory Visit | Attending: Physician Assistant | Admitting: Physician Assistant

## 2020-11-17 ENCOUNTER — Other Ambulatory Visit: Payer: Self-pay

## 2020-11-17 ENCOUNTER — Encounter (HOSPITAL_COMMUNITY): Payer: Self-pay | Admitting: Physician Assistant

## 2020-11-17 VITALS — BP 100/90 | HR 117 | Ht 64.0 in | Wt 156.8 lb

## 2020-11-17 DIAGNOSIS — Z7901 Long term (current) use of anticoagulants: Secondary | ICD-10-CM | POA: Insufficient documentation

## 2020-11-17 DIAGNOSIS — Z79899 Other long term (current) drug therapy: Secondary | ICD-10-CM | POA: Insufficient documentation

## 2020-11-17 DIAGNOSIS — I5042 Chronic combined systolic (congestive) and diastolic (congestive) heart failure: Secondary | ICD-10-CM | POA: Insufficient documentation

## 2020-11-17 DIAGNOSIS — Z87891 Personal history of nicotine dependence: Secondary | ICD-10-CM | POA: Insufficient documentation

## 2020-11-17 DIAGNOSIS — D6869 Other thrombophilia: Secondary | ICD-10-CM | POA: Diagnosis not present

## 2020-11-17 DIAGNOSIS — I4819 Other persistent atrial fibrillation: Secondary | ICD-10-CM

## 2020-11-17 DIAGNOSIS — Z8249 Family history of ischemic heart disease and other diseases of the circulatory system: Secondary | ICD-10-CM | POA: Insufficient documentation

## 2020-11-17 DIAGNOSIS — I4892 Unspecified atrial flutter: Secondary | ICD-10-CM | POA: Diagnosis not present

## 2020-11-17 LAB — BASIC METABOLIC PANEL
Anion gap: 7 (ref 5–15)
BUN: 19 mg/dL (ref 8–23)
CO2: 26 mmol/L (ref 22–32)
Calcium: 9.3 mg/dL (ref 8.9–10.3)
Chloride: 105 mmol/L (ref 98–111)
Creatinine, Ser: 1.15 mg/dL — ABNORMAL HIGH (ref 0.44–1.00)
GFR, Estimated: 52 mL/min — ABNORMAL LOW (ref >60)
Glucose, Bld: 87 mg/dL (ref 70–99)
Potassium: 4.2 mmol/L (ref 3.5–5.1)
Sodium: 138 mmol/L (ref 135–145)

## 2020-11-17 LAB — CBC
HCT: 40.4 % (ref 36.0–46.0)
Hemoglobin: 13 g/dL (ref 12.0–15.0)
MCH: 31.3 pg (ref 26.0–34.0)
MCHC: 32.2 g/dL (ref 30.0–36.0)
MCV: 97.3 fL (ref 80.0–100.0)
Platelets: 215 10*3/uL (ref 150–400)
RBC: 4.15 MIL/uL (ref 3.87–5.11)
RDW: 12.4 % (ref 11.5–15.5)
WBC: 4.9 10*3/uL (ref 4.0–10.5)
nRBC: 0 % (ref 0.0–0.2)

## 2020-11-17 NOTE — Patient Instructions (Signed)
Cardioversion scheduled for Wednesday, September 7th  - Arrive at the [facility] and go to admitting at 930AM  - Do not eat or drink anything after midnight the night prior to your procedure.  - Take all your morning medication (except diabetic medications) with a sip of water prior to arrival.  - You will not be able to drive home after your procedure.  - Do NOT miss any doses of your blood thinner - if you should miss a dose please notify our office immediately.  - If you feel as if you go back into normal rhythm prior to scheduled cardioversion, please notify our office immediately. If your procedure is canceled in the cardioversion suite you will be charged a cancellation fee.  Patients will be asked to: to mask in public and hand hygiene (no longer quarantine) in the 3 days prior to surgery, to report if any COVID-19-like illness or household contacts to COVID-19 to determine need for testing    

## 2020-11-17 NOTE — H&P (View-Only) (Signed)
Primary Care Physician: [place], [name], MD Referring Physician: Dr. Excell Seltzer Primary EP: Dr Danielle Rankin Wanda [name] is a 70 y.o. female with a h/o  aortic insufficiency, combined systolic and diastolic CHF, non-ischemic dilated CM. She underwent bioprosthetic AVR by Dr. Tyrone Sage in February 2018.  Postoperative course was complicated by atrial fibrillation with a rapid rate requiring IV amiodarone.  Echo in June 2019 demonstrated improved LV function with an EF of 55-60% and mild diastolic   She was seen by Dr. Excell Seltzer 02/01/18 after an admission to the hospital in [facility] with atrial fibrillation with rapid rate complicated by congestive heart failure.  She was started on Apixaban for anticoagulation and diltiazem and metoprolol for rate control.  Echo done in the hospital in [place], [place] demonstrated an EF of 45-50 and a normally functioning aortic valve prosthesis.  Elective cardioversion was arranged after 3 weeks of uninterrupted anticoagulation.  She underwent cardioversion on 02/16/2018 with restoration of normal sinus rhythm (after 4 attempts-120 J, 150 J, 200 J x 2).  She had f/u with Tereso Newcomer, PA on 12/20. She felt better for a few days but went back into afib with feeling more fatigued and short of breath.  She is now in the afib clinic, 12/23, to discuss options to restore SR. She does not feel terrible in afib but better in SR. She is dealing with a lot of stress with substance abuse in her son and her concerns for his health. He is currently living with the pt and her husband and going thru detox. She feels this is what made her go back in  afib so quickly. [name] thought that Tikosyn may be a good option but pt will have to check on cost of drug.  F/u in AF clinic 07/20/18. Patient is s/p AF ablation with Dr Johney Frame 06/28/18. Recall she failed Tikosyn 2/2 QT prolongation. She feels more heart racing over the last few days with increased SOB and fatigue. She reports that she  becomes SOB even just walking from one room to the next. She admits that she has significant life stressors right now with ill family members and the passing of the family dog. She brings in her Kardia strips which show paroxysms of afib with RVR and also SR. She does have some abdominal fullness and orthopnea which improved with PRN Lasix. No swallowing or groin issues.  F/u in AF clinic 08/13/18. S/p DCCV on 08/02/18. She reports that she feels improved with less SOB and more energy. She states her hands are not as swollen although she does have abdominal fullness at times. She has not taken an extra dose of Lasix. She reports that her Lourena Simmonds has shown SR ever since her DCCV.  Return to afib clinic 10/1. She was seen last by Dr. Johney Frame 3 months ago and is here for f/u. In that note he said to decrease amiodarone to 100 mg daily at this visit and eventually wean off. Pt states a couple of weeks ago she started noticing rainbows areound lights, dizziness, and nausea. 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 She has not had any recurrent afib.  Follow up in the AF clinic 07/02/20. Patient reports that she went into afib about one week ago with symptoms of heart racing. Her Kardia mobile device showed afib with HR ~140 bpm. She increased her metoprolol which has  helped slow her heart rate to 90s-100s. There were no specific triggers that she could identify.   Follow up in the AF clinic 07/21/20. Patient called the clinic to report she was no longer in persistent afib. She sent in Kardia mobile strips which showed some intermittent SR, DCCV cancelled. We reviewed her recent Kardia strips in office today which show several episodes of paroxysmal rapid afib.    Follow up in the AF clinic 11/17/20. Patient is s/p repeat afib and flutter ablation 10/20/20. She reports that she had some heart racing the week after  ablation but then went back to SR. Then on 11/05/20 she went out of rhythm and has been persistent since. She does feel more SOB while out of rhythm. There are no specific triggers that she could identify.   Today, she denies symptoms of chest pain, PND, lower extremity edema, dizziness, presyncope, syncope, or neurologic sequela. The patient is tolerating medications without difficulties and is otherwise without complaint today.  Past Medical History:  Diagnosis Date   Anxiety    Arthritis 2018   hands have general aches   CHF (congestive heart failure) (HCC) 2018   has shortness of breath since CHF diagnosis and has increased recently; states this is reason for admission   Chronic systolic heart failure (HCC) 05/25/2016   tachycardia mediated,  resolved previously with sinus rhythm   Depression    Dyspnea 2018   Migraine    Nonischemic cardiomyopathy (HCC)    tachycardia mediated,  resolved previously with sinus rhythm   Persistent atrial fibrillation (HCC) 05/13/2016   PVC's (premature ventricular contractions) 05/25/2016   Holter 11/17 The basic rhythm is sinus. There are frequent PVCs, periods of ventricular bigeminy, and occasional ventricular couplets and triplets.   S/P AVR (aortic valve replacement)    Past Surgical History:  Procedure Laterality Date   AORTIC VALVE REPLACEMENT N/A 05/03/2016   Procedure: AORTIC VALVE REPLACEMENT (AVR) WITH MAGNA EASE PERICARDIAL BIOPROSTHESIS - AORTIC 21 MM MODEL 3300TFX;  Surgeon: [name], MD;  Location: [facility] OR;  Service: Open Heart Surgery;  Laterality: N/A;   ATRIAL FIBRILLATION ABLATION N/A 06/28/2018   Procedure: ATRIAL FIBRILLATION ABLATION;  Surgeon: Hillis Range, MD;  Location: [facility] INVASIVE CV LAB;  Service: Cardiovascular;  Laterality: N/A;   ATRIAL FIBRILLATION ABLATION N/A 10/20/2020   Procedure: ATRIAL FIBRILLATION ABLATION;  Surgeon: Hillis Range, MD;  Location: [facility] INVASIVE CV LAB;  Service: Cardiovascular;  Laterality: N/A;    CARDIOVERSION N/A 02/16/2018   Procedure: CARDIOVERSION;  Surgeon: Lars Masson, MD;  Location: [facility] ENDOSCOPY;  Service: Cardiovascular;  Laterality: N/A;   CARDIOVERSION N/A 08/02/2018   Procedure: CARDIOVERSION;  Surgeon: Chilton Si, MD;  Location: [facility] ENDOSCOPY;  Service: Cardiovascular;  Laterality: N/A;   EYE SURGERY  2005   Lasik   TEE WITHOUT CARDIOVERSION N/A 05/03/2016   Procedure: TRANSESOPHAGEAL ECHOCARDIOGRAM (TEE);  Surgeon: [name], MD;  Location: [facility] OR;  Service: Open Heart Surgery;  Laterality: N/A;   TONSILLECTOMY AND ADENOIDECTOMY      Current Outpatient Medications  Medication Sig Dispense Refill   acetaminophen (TYLENOL) 500 MG tablet Take 500-1,000 mg by mouth every 6 (six) hours as needed (for pain.).     apixaban (ELIQUIS) 5 MG TABS tablet Take 1 tablet (5 mg total) by mouth 2 (two) times daily. 60 tablet 5   furosemide (LASIX) 20 MG tablet TAKE 1 TABLET EACH DAY. 90 tablet 3   LORazepam (ATIVAN) 0.5 MG tablet Take 0.5 mg by mouth  daily as needed for anxiety.      metoprolol tartrate (LOPRESSOR) 25 MG tablet Take 25 mg by mouth at bedtime.     pantoprazole (PROTONIX) 40 MG tablet Take 1 tablet (40 mg total) by mouth daily. (Patient not taking: Reported on 11/17/2020) 45 tablet 0   rizatriptan (MAXALT-MLT) 10 MG disintegrating tablet Take 10 mg by mouth as needed for migraine.     melatonin 5 MG TABS Take 5 mg by mouth at bedtime as needed (sleep).     Potassium 99 MG TABS Take 99 mg by mouth in the morning.     No current facility-administered medications for this encounter.    Allergies  Allergen Reactions   Tikosyn [Dofetilide] Other (See Comments)    Prolonged QT, nonsustained torsades at low dose   Codeine Nausea And Vomiting   Vicodin [Hydrocodone-Acetaminophen] Nausea And Vomiting    Social History   Socioeconomic History   Marital status: Married    Spouse name: Not on file   Number of children: Not on file   Years of education:  Not on file   Highest education level: Not on file  Occupational History   Not on file  Tobacco Use   Smoking status: Former   Smokeless tobacco: Never   Tobacco comments:    stopped in 70's while in college; smoked 1-2 years  Vaping Use   Vaping Use: Never used  Substance and Sexual Activity   Alcohol use: Yes    Alcohol/week: 1.0 - 2.0 standard drink    Types: 1 - 2 Glasses of wine per week    Comment: 1-2 glasses of wine; hasn't had wine for last month   Drug use: No   Sexual activity: Yes    Partners: Male    Birth control/protection: Other-see comments, Post-menopausal    Comment: vasectomy  Other Topics Concern   Not on file  Social History Narrative   Lives in [place]   retired   Social Determinants of Health   Financial Resource Strain: Not on file  Food Insecurity: Not on file  Transportation Needs: Not on file  Physical Activity: Not on file  Stress: Not on file  Social Connections: Not on file  Intimate Partner Violence: Not on file    Family History  Problem Relation Age of Onset   Diabetes Mother    Ovarian cancer Maternal Grandmother 8   Breast cancer Sister 28   Congestive Heart Failure Maternal Grandfather    Heart disease Brother        valve replacement   Deep vein thrombosis Brother    Lung cancer Brother    Deep vein thrombosis Father    Deep vein thrombosis Paternal Aunt        x2   Deep vein thrombosis Paternal Grandmother     ROS- All systems are reviewed and negative except as per the HPI above  Physical Exam: Vitals:   11/17/20 1113  BP: 100/90  Pulse: (!) 117  Weight: 71.1 kg  Height: 5\' 4"  (1.626 m)    Wt Readings from Last 3 Encounters:  11/17/20 71.1 kg  10/20/20 69.9 kg  10/12/20 70.7 kg    Labs: Lab Results  Component Value Date   NA 142 10/08/2020   K 4.6 10/08/2020   CL 102 10/08/2020   CO2 24 10/08/2020   GLUCOSE 103 (H) 10/08/2020   BUN 19 10/08/2020   CREATININE 1.21 (H) 10/08/2020   CALCIUM 9.2  10/08/2020   MG 2.1 07/25/2018  Lab Results  Component Value Date   INR 1.8 07/07/2016   Lab Results  Component Value Date   CHOL 247 (H) 08/18/2015   HDL 108 08/18/2015   LDLCALC 124 08/18/2015   TRIG 73 08/18/2015    GEN- The patient is a well appearing female, alert and oriented x 3 today.   HEENT-head normocephalic, atraumatic, sclera clear, conjunctiva pink, hearing intact, trachea midline. Lungs- Clear to ausculation bilaterally, normal work of breathing Heart- irregular rate and rhythm, no murmurs, rubs or gallops  GI- soft, NT, ND, + BS Extremities- no clubbing, cyanosis, or edema MS- no significant deformity or atrophy Skin- no rash or lesion Psych- euthymic mood, full affect Neuro- strength and sensation are intact   EKG today demonstrates Atypical atrial flutter vs coarse afib, PVC Vent. rate 117 BPM PR interval * ms QRS duration 106 ms QT/QTcB 338/471 ms   Echo 02/28/20 1. Left ventricular ejection fraction, by estimation, is 50 to 55%. The  left ventricle has low normal function. The left ventricle has no regional  wall motion abnormalities. There is mild left ventricular hypertrophy.  Left ventricular diastolic  parameters are consistent with Grade I diastolic dysfunction (impaired  relaxation).   2. Right ventricular systolic function is normal. The right ventricular  size is normal. Tricuspid regurgitation signal is inadequate for assessing  PA pressure.   3. The mitral valve is grossly normal. Mild to moderate mitral valve  regurgitation.   4. The aortic valve has been repaired/replaced. There is mild thickening  of the aortic valve. Aortic valve regurgitation is trivial. There is a 21  mm Edwards pericardial valve present in the aortic position. Procedure  Date: 05/03/2016. Aortic valve mean  gradient measures 13.0 mmHg.   5. The inferior vena cava is normal in size with greater than 50%  respiratory variability, suggesting right atrial pressure  of 3 mmHg.   Comparison(s): A prior study was performed on 01/11/2019. Prior images  reviewed side by side. LVEF has improved. Prosthetic aortic valve gradient  is lower on today's study.  Epic notes reviewed.  Assessment and Plan: 1. Persistent atrial fibrillation/atrial flutter S/p ablation with Dr Johney Frame 06/28/18 and repeat afib and flutter ablation 10/20/20. Patient now appears persistently in afib.  Will arrange for DCCV. Check bmet/cbc. Recall she failed dofetilide with long QT. 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 Continue Eliquis 5 mg BID with no missed doses for 3 months post ablation.  Continue Toprol 12.5 mg daily with extra 12.5 as needed for heart racing.  Most recent EF 50-55%, ? If she could take flecainide, will d/w EP.  This patients CHA2DS2-VASc Score and unadjusted Ischemic Stroke Rate (% per year) is equal to 3.2 % stroke rate/year from a score of 3  2. Chronic combined systolic and diastolic CHF EF improved post ablation. No signs or symptoms of fluid overload.   Follow up in the AF clinic post DCCV.    Jorja Loa PA-C Afib Clinic [facility] [address] [place], [place] [address] [phone]

## 2020-11-17 NOTE — Progress Notes (Signed)
Primary Care Physician: [place], [name], MD Referring Physician: Dr. Excell Seltzer Primary EP: Dr Danielle Rankin Wanda [name] is a 70 y.o. female with a h/o  aortic insufficiency, combined systolic and diastolic CHF, non-ischemic dilated CM. She underwent bioprosthetic AVR by Dr. Tyrone Sage in February 2018.  Postoperative course was complicated by atrial fibrillation with a rapid rate requiring IV amiodarone.  Echo in June 2019 demonstrated improved LV function with an EF of 55-60% and mild diastolic   She was seen by Dr. Excell Seltzer 02/01/18 after an admission to the hospital in [facility] with atrial fibrillation with rapid rate complicated by congestive heart failure.  She was started on Apixaban for anticoagulation and diltiazem and metoprolol for rate control.  Echo done in the hospital in [place], [place] demonstrated an EF of 45-50 and a normally functioning aortic valve prosthesis.  Elective cardioversion was arranged after 3 weeks of uninterrupted anticoagulation.  She underwent cardioversion on 02/16/2018 with restoration of normal sinus rhythm (after 4 attempts-120 J, 150 J, 200 J x 2).  She had f/u with Tereso Newcomer, PA on 12/20. She felt better for a few days but went back into afib with feeling more fatigued and short of breath.  She is now in the afib clinic, 12/23, to discuss options to restore SR. She does not feel terrible in afib but better in SR. She is dealing with a lot of stress with substance abuse in her son and her concerns for his health. He is currently living with the pt and her husband and going thru detox. She feels this is what made her go back in  afib so quickly. [name] thought that Tikosyn may be a good option but pt will have to check on cost of drug.  F/u in AF clinic 07/20/18. Patient is s/p AF ablation with Dr Johney Frame 06/28/18. Recall she failed Tikosyn 2/2 QT prolongation. She feels more heart racing over the last few days with increased SOB and fatigue. She reports that she  becomes SOB even just walking from one room to the next. She admits that she has significant life stressors right now with ill family members and the passing of the family dog. She brings in her Kardia strips which show paroxysms of afib with RVR and also SR. She does have some abdominal fullness and orthopnea which improved with PRN Lasix. No swallowing or groin issues.  F/u in AF clinic 08/13/18. S/p DCCV on 08/02/18. She reports that she feels improved with less SOB and more energy. She states her hands are not as swollen although she does have abdominal fullness at times. She has not taken an extra dose of Lasix. She reports that her Lourena Simmonds has shown SR ever since her DCCV.  Return to afib clinic 10/1. She was seen last by Dr. Johney Frame 3 months ago and is here for f/u. In that note he said to decrease amiodarone to 100 mg daily at this visit and eventually wean off. Pt states a couple of weeks ago she started noticing rainbows areound lights, dizziness, and nausea. 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 She has not had any recurrent afib.  Follow up in the AF clinic 07/02/20. Patient reports that she went into afib about one week ago with symptoms of heart racing. Her Kardia mobile device showed afib with HR ~140 bpm. She increased her metoprolol which has  helped slow her heart rate to 90s-100s. There were no specific triggers that she could identify.   Follow up in the AF clinic 07/21/20. Patient called the clinic to report she was no longer in persistent afib. She sent in Kardia mobile strips which showed some intermittent SR, DCCV cancelled. We reviewed her recent Kardia strips in office today which show several episodes of paroxysmal rapid afib.    Follow up in the AF clinic 11/17/20. Patient is s/p repeat afib and flutter ablation 10/20/20. She reports that she had some heart racing the week after  ablation but then went back to SR. Then on 11/05/20 she went out of rhythm and has been persistent since. She does feel more SOB while out of rhythm. There are no specific triggers that she could identify.   Today, she denies symptoms of chest pain, PND, lower extremity edema, dizziness, presyncope, syncope, or neurologic sequela. The patient is tolerating medications without difficulties and is otherwise without complaint today.  Past Medical History:  Diagnosis Date   Anxiety    Arthritis 2018   hands have general aches   CHF (congestive heart failure) (HCC) 2018   has shortness of breath since CHF diagnosis and has increased recently; states this is reason for admission   Chronic systolic heart failure (HCC) 05/25/2016   tachycardia mediated,  resolved previously with sinus rhythm   Depression    Dyspnea 2018   Migraine    Nonischemic cardiomyopathy (HCC)    tachycardia mediated,  resolved previously with sinus rhythm   Persistent atrial fibrillation (HCC) 05/13/2016   PVC's (premature ventricular contractions) 05/25/2016   Holter 11/17 The basic rhythm is sinus. There are frequent PVCs, periods of ventricular bigeminy, and occasional ventricular couplets and triplets.   S/P AVR (aortic valve replacement)    Past Surgical History:  Procedure Laterality Date   AORTIC VALVE REPLACEMENT N/A 05/03/2016   Procedure: AORTIC VALVE REPLACEMENT (AVR) WITH MAGNA EASE PERICARDIAL BIOPROSTHESIS - AORTIC 21 MM MODEL 3300TFX;  Surgeon: [name], MD;  Location: [facility] OR;  Service: Open Heart Surgery;  Laterality: N/A;   ATRIAL FIBRILLATION ABLATION N/A 06/28/2018   Procedure: ATRIAL FIBRILLATION ABLATION;  Surgeon: Hillis Range, MD;  Location: [facility] INVASIVE CV LAB;  Service: Cardiovascular;  Laterality: N/A;   ATRIAL FIBRILLATION ABLATION N/A 10/20/2020   Procedure: ATRIAL FIBRILLATION ABLATION;  Surgeon: Hillis Range, MD;  Location: [facility] INVASIVE CV LAB;  Service: Cardiovascular;  Laterality: N/A;    CARDIOVERSION N/A 02/16/2018   Procedure: CARDIOVERSION;  Surgeon: Lars Masson, MD;  Location: [facility] ENDOSCOPY;  Service: Cardiovascular;  Laterality: N/A;   CARDIOVERSION N/A 08/02/2018   Procedure: CARDIOVERSION;  Surgeon: Chilton Si, MD;  Location: [facility] ENDOSCOPY;  Service: Cardiovascular;  Laterality: N/A;   EYE SURGERY  2005   Lasik   TEE WITHOUT CARDIOVERSION N/A 05/03/2016   Procedure: TRANSESOPHAGEAL ECHOCARDIOGRAM (TEE);  Surgeon: [name], MD;  Location: [facility] OR;  Service: Open Heart Surgery;  Laterality: N/A;   TONSILLECTOMY AND ADENOIDECTOMY      Current Outpatient Medications  Medication Sig Dispense Refill   acetaminophen (TYLENOL) 500 MG tablet Take 500-1,000 mg by mouth every 6 (six) hours as needed (for pain.).     apixaban (ELIQUIS) 5 MG TABS tablet Take 1 tablet (5 mg total) by mouth 2 (two) times daily. 60 tablet 5   furosemide (LASIX) 20 MG tablet TAKE 1 TABLET EACH DAY. 90 tablet 3   LORazepam (ATIVAN) 0.5 MG tablet Take 0.5 mg by mouth  daily as needed for anxiety.      metoprolol tartrate (LOPRESSOR) 25 MG tablet Take 25 mg by mouth at bedtime.     pantoprazole (PROTONIX) 40 MG tablet Take 1 tablet (40 mg total) by mouth daily. (Patient not taking: Reported on 11/17/2020) 45 tablet 0   rizatriptan (MAXALT-MLT) 10 MG disintegrating tablet Take 10 mg by mouth as needed for migraine.     melatonin 5 MG TABS Take 5 mg by mouth at bedtime as needed (sleep).     Potassium 99 MG TABS Take 99 mg by mouth in the morning.     No current facility-administered medications for this encounter.    Allergies  Allergen Reactions   Tikosyn [Dofetilide] Other (See Comments)    Prolonged QT, nonsustained torsades at low dose   Codeine Nausea And Vomiting   Vicodin [Hydrocodone-Acetaminophen] Nausea And Vomiting    Social History   Socioeconomic History   Marital status: Married    Spouse name: Not on file   Number of children: Not on file   Years of education:  Not on file   Highest education level: Not on file  Occupational History   Not on file  Tobacco Use   Smoking status: Former   Smokeless tobacco: Never   Tobacco comments:    stopped in 70's while in college; smoked 1-2 years  Vaping Use   Vaping Use: Never used  Substance and Sexual Activity   Alcohol use: Yes    Alcohol/week: 1.0 - 2.0 standard drink    Types: 1 - 2 Glasses of wine per week    Comment: 1-2 glasses of wine; hasn't had wine for last month   Drug use: No   Sexual activity: Yes    Partners: Male    Birth control/protection: Other-see comments, Post-menopausal    Comment: vasectomy  Other Topics Concern   Not on file  Social History Narrative   Lives in [place]   retired   Social Determinants of Health   Financial Resource Strain: Not on file  Food Insecurity: Not on file  Transportation Needs: Not on file  Physical Activity: Not on file  Stress: Not on file  Social Connections: Not on file  Intimate Partner Violence: Not on file    Family History  Problem Relation Age of Onset   Diabetes Mother    Ovarian cancer Maternal Grandmother 8   Breast cancer Sister 28   Congestive Heart Failure Maternal Grandfather    Heart disease Brother        valve replacement   Deep vein thrombosis Brother    Lung cancer Brother    Deep vein thrombosis Father    Deep vein thrombosis Paternal Aunt        x2   Deep vein thrombosis Paternal Grandmother     ROS- All systems are reviewed and negative except as per the HPI above  Physical Exam: Vitals:   11/17/20 1113  BP: 100/90  Pulse: (!) 117  Weight: 71.1 kg  Height: 5\' 4"  (1.626 m)    Wt Readings from Last 3 Encounters:  11/17/20 71.1 kg  10/20/20 69.9 kg  10/12/20 70.7 kg    Labs: Lab Results  Component Value Date   NA 142 10/08/2020   K 4.6 10/08/2020   CL 102 10/08/2020   CO2 24 10/08/2020   GLUCOSE 103 (H) 10/08/2020   BUN 19 10/08/2020   CREATININE 1.21 (H) 10/08/2020   CALCIUM 9.2  10/08/2020   MG 2.1 07/25/2018  Lab Results  Component Value Date   INR 1.8 07/07/2016   Lab Results  Component Value Date   CHOL 247 (H) 08/18/2015   HDL 108 08/18/2015   LDLCALC 124 08/18/2015   TRIG 73 08/18/2015    GEN- The patient is a well appearing female, alert and oriented x 3 today.   HEENT-head normocephalic, atraumatic, sclera clear, conjunctiva pink, hearing intact, trachea midline. Lungs- Clear to ausculation bilaterally, normal work of breathing Heart- irregular rate and rhythm, no murmurs, rubs or gallops  GI- soft, NT, ND, + BS Extremities- no clubbing, cyanosis, or edema MS- no significant deformity or atrophy Skin- no rash or lesion Psych- euthymic mood, full affect Neuro- strength and sensation are intact   EKG today demonstrates Atypical atrial flutter vs coarse afib, PVC Vent. rate 117 BPM PR interval * ms QRS duration 106 ms QT/QTcB 338/471 ms   Echo 02/28/20 1. Left ventricular ejection fraction, by estimation, is 50 to 55%. The  left ventricle has low normal function. The left ventricle has no regional  wall motion abnormalities. There is mild left ventricular hypertrophy.  Left ventricular diastolic  parameters are consistent with Grade I diastolic dysfunction (impaired  relaxation).   2. Right ventricular systolic function is normal. The right ventricular  size is normal. Tricuspid regurgitation signal is inadequate for assessing  PA pressure.   3. The mitral valve is grossly normal. Mild to moderate mitral valve  regurgitation.   4. The aortic valve has been repaired/replaced. There is mild thickening  of the aortic valve. Aortic valve regurgitation is trivial. There is a 21  mm Edwards pericardial valve present in the aortic position. Procedure  Date: 05/03/2016. Aortic valve mean  gradient measures 13.0 mmHg.   5. The inferior vena cava is normal in size with greater than 50%  respiratory variability, suggesting right atrial pressure  of 3 mmHg.   Comparison(s): A prior study was performed on 01/11/2019. Prior images  reviewed side by side. LVEF has improved. Prosthetic aortic valve gradient  is lower on today's study.  Epic notes reviewed.  Assessment and Plan: 1. Persistent atrial fibrillation/atrial flutter S/p ablation with Dr Johney Frame 06/28/18 and repeat afib and flutter ablation 10/20/20. Patient now appears persistently in afib.  Will arrange for DCCV. Check bmet/cbc. Recall she failed dofetilide with long QT. 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 Continue Eliquis 5 mg BID with no missed doses for 3 months post ablation.  Continue Toprol 12.5 mg daily with extra 12.5 as needed for heart racing.  Most recent EF 50-55%, ? If she could take flecainide, will d/w EP.  This patients CHA2DS2-VASc Score and unadjusted Ischemic Stroke Rate (% per year) is equal to 3.2 % stroke rate/year from a score of 3  2. Chronic combined systolic and diastolic CHF EF improved post ablation. No signs or symptoms of fluid overload.   Follow up in the AF clinic post DCCV.    Jorja Loa PA-C Afib Clinic [facility] [address] [place], [place] [address] [phone]

## 2020-11-18 ENCOUNTER — Ambulatory Visit (HOSPITAL_COMMUNITY)
Admission: RE | Admit: 2020-11-18 | Discharge: 2020-11-18 | Disposition: A | Discharge status: Home / Self Care | Payer: Medicare PPO | Attending: Cardiovascular Disease | Admitting: Cardiovascular Disease

## 2020-11-18 ENCOUNTER — Other Ambulatory Visit: Payer: Self-pay

## 2020-11-18 ENCOUNTER — Ambulatory Visit (HOSPITAL_COMMUNITY): Payer: Medicare PPO | Admitting: Anesthesiology

## 2020-11-18 ENCOUNTER — Encounter (HOSPITAL_COMMUNITY)
Admission: RE | Disposition: A | Discharge status: Home / Self Care | Payer: Self-pay | Source: Home / Self Care | Attending: Cardiovascular Disease

## 2020-11-18 DIAGNOSIS — I4892 Unspecified atrial flutter: Secondary | ICD-10-CM | POA: Diagnosis not present

## 2020-11-18 DIAGNOSIS — I4891 Unspecified atrial fibrillation: Secondary | ICD-10-CM

## 2020-11-18 DIAGNOSIS — I5042 Chronic combined systolic (congestive) and diastolic (congestive) heart failure: Secondary | ICD-10-CM | POA: Insufficient documentation

## 2020-11-18 DIAGNOSIS — I4819 Other persistent atrial fibrillation: Secondary | ICD-10-CM | POA: Diagnosis not present

## 2020-11-18 DIAGNOSIS — Z885 Allergy status to narcotic agent status: Secondary | ICD-10-CM | POA: Insufficient documentation

## 2020-11-18 DIAGNOSIS — Z7901 Long term (current) use of anticoagulants: Secondary | ICD-10-CM | POA: Diagnosis not present

## 2020-11-18 DIAGNOSIS — Z952 Presence of prosthetic heart valve: Secondary | ICD-10-CM | POA: Insufficient documentation

## 2020-11-18 DIAGNOSIS — Z79899 Other long term (current) drug therapy: Secondary | ICD-10-CM | POA: Insufficient documentation

## 2020-11-18 DIAGNOSIS — Z888 Allergy status to other drugs, medicaments and biological substances status: Secondary | ICD-10-CM | POA: Diagnosis not present

## 2020-11-18 DIAGNOSIS — I420 Dilated cardiomyopathy: Secondary | ICD-10-CM | POA: Diagnosis not present

## 2020-11-18 HISTORY — PX: CARDIOVERSION: SHX1299

## 2020-11-18 SURGERY — CARDIOVERSION
Anesthesia: General

## 2020-11-18 MED ORDER — PHENYLEPHRINE HCL (PRESSORS) 10 MG/ML IV SOLN
INTRAVENOUS | Status: DC | PRN
Start: 2020-11-18 — End: 2020-11-18
  Administered 2020-11-18: 80 ug via INTRAVENOUS

## 2020-11-18 MED ORDER — SODIUM CHLORIDE 0.9 % IV SOLN: INTRAVENOUS | Status: DC | PRN

## 2020-11-18 MED ORDER — PROPOFOL 10 MG/ML IV BOLUS
INTRAVENOUS | Status: DC | PRN
  Administered 2020-11-18: 50 mg via INTRAVENOUS

## 2020-11-18 MED ORDER — LIDOCAINE 2% (20 MG/ML) 5 ML SYRINGE
INTRAMUSCULAR | Status: DC | PRN
  Administered 2020-11-18: 60 mg via INTRAVENOUS

## 2020-11-18 MED ORDER — SODIUM CHLORIDE 0.9 % IV SOLN: Freq: Once | INTRAVENOUS | Status: AC

## 2020-11-18 NOTE — Anesthesia Procedure Notes (Addendum)
Procedure Name: General with mask airway Date/Time: 11/18/2020 10:36 AM Performed by: Aundria Rud, CRNA Pre-anesthesia Checklist: Patient identified, Emergency Drugs available, Suction available and Patient being monitored Patient Re-evaluated:Patient Re-evaluated prior to induction Oxygen Delivery Method: Ambu bag Preoxygenation: Pre-oxygenation with 100% oxygen Induction Type: IV induction Placement Confirmation: positive ETCO2 and CO2 detector Dental Injury: Teeth and Oropharynx as per pre-operative assessment

## 2020-11-18 NOTE — Anesthesia Preprocedure Evaluation (Addendum)
Anesthesia Evaluation  Patient identified by MRN, date of birth, ID band Patient awake    Reviewed: Allergy & Precautions, NPO status , Patient's Chart, lab work & pertinent test results, reviewed documented beta blocker date and time   Airway Mallampati: II  TM Distance: >3 FB Neck ROM: Full    Dental  (+) Teeth Intact, Dental Advisory Given   Pulmonary neg pulmonary ROS, former smoker,    Pulmonary exam normal breath sounds clear to auscultation       Cardiovascular +CHF (grade 1 diastolic dysfunction)  Normal cardiovascular exam+ dysrhythmias (eliquis) Atrial Fibrillation + Valvular Problems/Murmurs (s/p AVR 2018, mild-mod MR) MR  Rhythm:Irregular Rate:Normal  Echo 02/2020: 1. Left ventricular ejection fraction, by estimation, is 50 to 55%. The  left ventricle has low normal function. The left ventricle has no regional  wall motion abnormalities. There is mild left ventricular hypertrophy.  Left ventricular diastolic  parameters are consistent with Grade I diastolic dysfunction (impaired  relaxation).  2. Right ventricular systolic function is normal. The right ventricular  size is normal. Tricuspid regurgitation signal is inadequate for assessing  PA pressure.  3. The mitral valve is grossly normal. Mild to moderate mitral valve  regurgitation.  4. The aortic valve has been repaired/replaced. There is mild thickening  of the aortic valve. Aortic valve regurgitation is trivial. There is a 21  mm Edwards pericardial valve present in the aortic position. Procedure  Date: 05/03/2016. Aortic valve mean  gradient measures 13.0 mmHg.  5. The inferior vena cava is normal in size with greater than 50%  respiratory variability, suggesting right atrial pressure of 3 mmHg.    Neuro/Psych  Headaches, PSYCHIATRIC DISORDERS Anxiety Depression    GI/Hepatic negative GI ROS, Neg liver ROS,   Endo/Other  negative endocrine ROS   Renal/GU negative Renal ROS  negative genitourinary   Musculoskeletal  (+) Arthritis , Osteoarthritis,    Abdominal   Peds  Hematology negative hematology ROS (+)   Anesthesia Other Findings   Reproductive/Obstetrics negative OB ROS                            Anesthesia Physical Anesthesia Plan  ASA: 3  Anesthesia Plan: General   Post-op Pain Management:    Induction: Intravenous  PONV Risk Score and Plan: TIVA and Treatment may vary due to age or medical condition  Airway Management Planned: Natural Airway and Mask  Additional Equipment: None  Intra-op Plan:   Post-operative Plan:   Informed Consent: I have reviewed the patients History and Physical, chart, labs and discussed the procedure including the risks, benefits and alternatives for the proposed anesthesia with the patient or authorized representative who has indicated his/her understanding and acceptance.     Dental advisory given  Plan Discussed with: CRNA  Anesthesia Plan Comments:        Anesthesia Quick Evaluation

## 2020-11-18 NOTE — CV Procedure (Signed)
DCC: Anesthesia:  Propofol No missed doses of eliquis  DCC x 1 150 J Converted from afib rate 122 to NSR rate 70 bpm  No immediate neurologic sequelae  Charlton Haws MD [facility]

## 2020-11-18 NOTE — Anesthesia Postprocedure Evaluation (Signed)
Anesthesia Post Note  Patient: Wanda [name]  Procedure(s) Performed: CARDIOVERSION     Patient location during evaluation: PACU Anesthesia Type: General Level of consciousness: awake and alert, oriented and patient cooperative Pain management: pain level controlled Vital Signs Assessment: post-procedure vital signs reviewed and stable Respiratory status: spontaneous breathing, nonlabored ventilation and respiratory function stable Cardiovascular status: blood pressure returned to baseline and stable Postop Assessment: no apparent nausea or vomiting Anesthetic complications: no   No notable events documented.  Last Vitals:  Vitals:   11/18/20 1100 11/18/20 1110  BP: 132/90   Pulse: 71 72  Resp: (!) 7 14  Temp:    SpO2: 100% 99%    Last Pain:  Vitals:   11/18/20 1110  TempSrc:   PainSc: 0-No pain                 Lannie Fields

## 2020-11-18 NOTE — Interval H&P Note (Signed)
History and Physical Interval Note:  11/18/2020 10:21 AM  Wanda [name]  has presented today for surgery, with the diagnosis of A-FIB.  The various methods of treatment have been discussed with the patient and family. After consideration of risks, benefits and other options for treatment, the patient has consented to  Procedure(s): CARDIOVERSION (N/A) as a surgical intervention.  The patient's history has been reviewed, patient examined, no change in status, stable for surgery.  I have reviewed the patient's chart and labs.  Questions were answered to the patient's satisfaction.     Charlton Haws

## 2020-11-18 NOTE — Transfer of Care (Signed)
Immediate Anesthesia Transfer of Care Note  Patient: Wanda [name]  Procedure(s) Performed: CARDIOVERSION  Patient Location: Endoscopy Unit  Anesthesia Type:General  Level of Consciousness: drowsy, patient cooperative and responds to stimulation  Airway & Oxygen Therapy: Patient Spontanous Breathing  Post-op Assessment: Report given to RN, Post -op Vital signs reviewed and stable and Patient moving all extremities  Post vital signs: Reviewed and stable  Last Vitals:  Vitals Value Taken Time  BP 124/84   Temp    Pulse 83   Resp 12   SpO2 95     Last Pain:  Vitals:   11/18/20 1004  TempSrc: Oral  PainSc: 0-No pain         Complications: No notable events documented.

## 2020-11-20 ENCOUNTER — Encounter (HOSPITAL_COMMUNITY): Payer: Self-pay | Admitting: Cardiovascular Disease

## 2020-11-25 ENCOUNTER — Other Ambulatory Visit: Payer: Self-pay

## 2020-11-25 ENCOUNTER — Ambulatory Visit (HOSPITAL_COMMUNITY)
Admission: RE | Admit: 2020-11-25 | Discharge: 2020-11-25 | Disposition: A | Discharge status: Home / Self Care | Payer: Medicare PPO | Source: Ambulatory Visit | Attending: Physician Assistant | Admitting: Physician Assistant

## 2020-11-25 VITALS — BP 104/66 | HR 117 | Ht 64.0 in | Wt 156.4 lb

## 2020-11-25 DIAGNOSIS — Z8249 Family history of ischemic heart disease and other diseases of the circulatory system: Secondary | ICD-10-CM | POA: Diagnosis not present

## 2020-11-25 DIAGNOSIS — I4819 Other persistent atrial fibrillation: Secondary | ICD-10-CM | POA: Diagnosis present

## 2020-11-25 DIAGNOSIS — I4892 Unspecified atrial flutter: Secondary | ICD-10-CM | POA: Diagnosis not present

## 2020-11-25 DIAGNOSIS — Z09 Encounter for follow-up examination after completed treatment for conditions other than malignant neoplasm: Secondary | ICD-10-CM | POA: Diagnosis not present

## 2020-11-25 DIAGNOSIS — Z7901 Long term (current) use of anticoagulants: Secondary | ICD-10-CM | POA: Diagnosis not present

## 2020-11-25 DIAGNOSIS — I428 Other cardiomyopathies: Secondary | ICD-10-CM | POA: Insufficient documentation

## 2020-11-25 DIAGNOSIS — I5042 Chronic combined systolic (congestive) and diastolic (congestive) heart failure: Secondary | ICD-10-CM | POA: Diagnosis not present

## 2020-11-25 DIAGNOSIS — Z79899 Other long term (current) drug therapy: Secondary | ICD-10-CM | POA: Insufficient documentation

## 2020-11-25 DIAGNOSIS — D6869 Other thrombophilia: Secondary | ICD-10-CM

## 2020-11-25 DIAGNOSIS — Z87891 Personal history of nicotine dependence: Secondary | ICD-10-CM | POA: Insufficient documentation

## 2020-11-25 MED ORDER — FLECAINIDE ACETATE 50 MG PO TABS: 50.0000 mg | ORAL_TABLET | Freq: Two times a day (BID) | ORAL | 3 refills | Status: DC

## 2020-11-25 NOTE — Patient Instructions (Signed)
Start flecainide 50mg twice a day 

## 2020-11-25 NOTE — Progress Notes (Signed)
Primary Care Physician: [place], [name], MD Referring Physician: Dr. Excell Seltzer Primary EP: Dr Danielle Rankin Wanda is a 70 y.o. [name] with a h/o  aortic insufficiency, combined systolic and diastolic CHF, non-ischemic dilated CM. She underwent bioprosthetic AVR by Dr. Tyrone Sage in February 2018.  Postoperative course was complicated by atrial fibrillation with a rapid rate requiring IV amiodarone.  Echo in June 2019 demonstrated improved LV function with an EF of 55-60% and mild diastolic   She was seen by Dr. Excell Seltzer 02/01/18 after an admission to [facility] in [facility] with atrial fibrillation with rapid rate complicated by congestive heart failure.  She was started on Apixaban for anticoagulation and diltiazem and metoprolol for rate control.  Echo done in [facility] in [place], [place] demonstrated an EF of 45-50 and a normally functioning aortic valve prosthesis.  Elective cardioversion was arranged after 3 weeks of uninterrupted anticoagulation.  She underwent cardioversion on 02/16/2018 with restoration of normal sinus rhythm (after 4 attempts-120 J, 150 J, 200 J x 2).  She had f/u with Tereso Newcomer, PA on 12/20. She felt better for a few days but went back into afib with feeling more fatigued and short of breath.  She is now in [facility] afib clinic, 12/23, to discuss options to restore SR. She does not feel terrible in afib but better in SR. She is dealing with a lot of stress with substance abuse in her son and her concerns for his health. He is currently living with Wanda pt and her husband and going thru detox. She feels this is what made her go back in  afib so quickly. [name] thought that Tikosyn may be a good option but pt will have to check on cost of drug.  F/u in AF clinic 07/20/18. [name] is s/p AF ablation with Dr Johney Frame 06/28/18. Recall she failed Tikosyn 2/2 QT prolongation. She feels more heart racing over Wanda last few days with increased SOB and fatigue. She reports that she  becomes SOB even just walking from one room to Wanda next. She admits that she has significant life stressors right now with ill family members and Wanda passing of Wanda family dog. She brings in her Kardia strips which show paroxysms of afib with RVR and also SR. She does have some abdominal fullness and orthopnea which improved with PRN Lasix. No swallowing or groin issues.  F/u in AF clinic 08/13/18. S/p DCCV on 08/02/18. She reports that she feels improved with less SOB and more energy. She states her hands are not as swollen although she does have abdominal fullness at times. She has not taken an extra dose of Lasix. She reports that her Lourena Simmonds has shown SR ever since her DCCV.  Return to afib clinic 10/1. She was seen last by Dr. Johney Frame 3 months ago and is here for f/u. In that note he said to decrease amiodarone to 100 mg daily at this visit and eventually wean off. Pt states a couple of weeks ago she started noticing rainbows areound lights, dizziness, and nausea. 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 She has not had any recurrent afib.  Follow up in [facility] AF clinic 07/02/20. [name] reports that she went into afib about one week ago with symptoms of heart racing. Her Kardia mobile device showed afib with HR ~140 bpm. She increased her metoprolol which has  helped slow her heart rate to 90s-100s. There were no specific triggers that she could identify.   Follow up in [facility] AF clinic 07/21/20. [name] called [facility] clinic to report she was no longer in persistent afib. She sent in Kardia mobile strips which showed some intermittent SR, DCCV cancelled. We reviewed her recent Kardia strips in office today which show several episodes of paroxysmal rapid afib.    Follow up in [facility] AF clinic 11/17/20. [name] is s/p repeat afib and flutter ablation 10/20/20. She reports that she had some heart racing Wanda week after  ablation but then went back to SR. Then on 11/05/20 she went out of rhythm and has been persistent since. She does feel more SOB while out of rhythm. There are no specific triggers that she could identify.   Follow up in [facility] AF clinic 11/25/20. [name] is s/p DCCV on 11/18/20. She reports that on 11/22/20 she felt she was back in afib with rapid heart rates. There was no specific trigger.   Today, she denies symptoms of chest pain, PND, lower extremity edema, dizziness, presyncope, syncope, or neurologic sequela. Wanda [name] is tolerating medications without difficulties and is otherwise without complaint today.  Past Medical History:  Diagnosis Date   Anxiety    Arthritis 2018   hands have general aches   CHF (congestive heart failure) (HCC) 2018   has shortness of breath since CHF diagnosis and has increased recently; states this is reason for admission   Chronic systolic heart failure (HCC) 05/25/2016   tachycardia mediated,  resolved previously with sinus rhythm   Depression    Dyspnea 2018   Migraine    Nonischemic cardiomyopathy (HCC)    tachycardia mediated,  resolved previously with sinus rhythm   Persistent atrial fibrillation (HCC) 05/13/2016   PVC's (premature ventricular contractions) 05/25/2016   Holter 11/17 Wanda basic rhythm is sinus. There are frequent PVCs, periods of ventricular bigeminy, and occasional ventricular couplets and triplets.   S/P AVR (aortic valve replacement)    Past Surgical History:  Procedure Laterality Date   AORTIC VALVE REPLACEMENT N/A 05/03/2016   Procedure: AORTIC VALVE REPLACEMENT (AVR) WITH MAGNA EASE PERICARDIAL BIOPROSTHESIS - AORTIC 21 MM MODEL 3300TFX;  Surgeon: [name], MD;  Location: [facility] OR;  Service: Open Heart Surgery;  Laterality: N/A;   ATRIAL FIBRILLATION ABLATION N/A 06/28/2018   Procedure: ATRIAL FIBRILLATION ABLATION;  Surgeon: Hillis Range, MD;  Location: [facility] INVASIVE CV LAB;  Service: Cardiovascular;  Laterality: N/A;   ATRIAL  FIBRILLATION ABLATION N/A 10/20/2020   Procedure: ATRIAL FIBRILLATION ABLATION;  Surgeon: Hillis Range, MD;  Location: [facility] INVASIVE CV LAB;  Service: Cardiovascular;  Laterality: N/A;   CARDIOVERSION N/A 02/16/2018   Procedure: CARDIOVERSION;  Surgeon: Lars Masson, MD;  Location: [facility] ENDOSCOPY;  Service: Cardiovascular;  Laterality: N/A;   CARDIOVERSION N/A 08/02/2018   Procedure: CARDIOVERSION;  Surgeon: Chilton Si, MD;  Location: [facility] ENDOSCOPY;  Service: Cardiovascular;  Laterality: N/A;   CARDIOVERSION N/A 11/18/2020   Procedure: CARDIOVERSION;  Surgeon: Wendall Stade, MD;  Location: [facility] ENDOSCOPY;  Service: Cardiovascular;  Laterality: N/A;   EYE SURGERY  2005   Lasik   TEE WITHOUT CARDIOVERSION N/A 05/03/2016   Procedure: TRANSESOPHAGEAL ECHOCARDIOGRAM (TEE);  Surgeon: [name], MD;  Location: [facility] OR;  Service: Open Heart Surgery;  Laterality: N/A;   TONSILLECTOMY AND ADENOIDECTOMY      Current Outpatient Medications  Medication Sig Dispense Refill   acetaminophen (TYLENOL) 500 MG tablet Take 500-1,000 mg by  mouth every 6 (six) hours as needed (for pain.).     apixaban (ELIQUIS) 5 MG TABS tablet Take 1 tablet (5 mg total) by mouth 2 (two) times daily. 60 tablet 5   flecainide (TAMBOCOR) 50 MG tablet Take 1 tablet (50 mg total) by mouth 2 (two) times daily. 60 tablet 3   furosemide (LASIX) 20 MG tablet TAKE 1 TABLET EACH DAY. 90 tablet 3   LORazepam (ATIVAN) 0.5 MG tablet Take 0.5 mg by mouth daily as needed for anxiety.      melatonin 5 MG TABS Take 5 mg by mouth at bedtime as needed (sleep).     metoprolol tartrate (LOPRESSOR) 25 MG tablet Take 25 mg by mouth at bedtime.     Potassium 99 MG TABS Take 99 mg by mouth in Wanda morning.     rizatriptan (MAXALT-MLT) 10 MG disintegrating tablet Take 10 mg by mouth as needed for migraine.     No current facility-administered medications for this encounter.    Allergies  Allergen Reactions   Tikosyn [Dofetilide] Other (See  Comments)    Prolonged QT, nonsustained torsades at low dose   Codeine Nausea And Vomiting   Vicodin [Hydrocodone-Acetaminophen] Nausea And Vomiting    Social History   Socioeconomic History   Marital status: Married    Spouse name: Not on file   Number of children: Not on file   Years of education: Not on file   Highest education level: Not on file  Occupational History   Not on file  Tobacco Use   Smoking status: Former   Smokeless tobacco: Never   Tobacco comments:    stopped in 70's while in college; smoked 1-2 years  Vaping Use   Vaping Use: Never used  Substance and Sexual Activity   Alcohol use: Yes    Alcohol/week: 1.0 - 2.0 standard drink    Types: 1 - 2 Glasses of wine per week    Comment: 1-2 glasses of wine; hasn't had wine for last month   Drug use: No   Sexual activity: Yes    Partners: Male    Birth control/protection: Other-see comments, Post-menopausal    Comment: vasectomy  Other Topics Concern   Not on file  Social History Narrative   Lives in [place]   retired   Social Determinants of Corporate investment banker Strain: Not on file  Food Insecurity: Not on file  Transportation Needs: Not on file  Physical Activity: Not on file  Stress: Not on file  Social Connections: Not on file  Intimate Partner Violence: Not on file    Family History  Problem Relation Age of Onset   Diabetes Mother    Ovarian cancer Maternal Grandmother 56   Breast cancer Sister 48   Congestive Heart Failure Maternal Grandfather    Heart disease Brother        valve replacement   Deep vein thrombosis Brother    Lung cancer Brother    Deep vein thrombosis Father    Deep vein thrombosis Paternal Aunt        x2   Deep vein thrombosis Paternal Grandmother     ROS- All systems are reviewed and negative except as per Wanda HPI above  Physical Exam: Vitals:   11/25/20 1110  BP: 104/66  Pulse: (!) 117  Weight: 70.9 kg  Height: 5\' 4"  (1.626 m)     Wt  Readings from Last 3 Encounters:  11/25/20 70.9 kg  11/18/20 70.3 kg  11/17/20  71.1 kg    Labs: Lab Results  Component Value Date   NA 138 11/17/2020   K 4.2 11/17/2020   CL 105 11/17/2020   CO2 26 11/17/2020   GLUCOSE 87 11/17/2020   BUN 19 11/17/2020   CREATININE 1.15 (H) 11/17/2020   CALCIUM 9.3 11/17/2020   MG 2.1 07/25/2018   Lab Results  Component Value Date   INR 1.8 07/07/2016   Lab Results  Component Value Date   CHOL 247 (H) 08/18/2015   HDL 108 08/18/2015   LDLCALC 124 08/18/2015   TRIG 73 08/18/2015    GEN- Wanda [name] is a well appearing [name], alert and oriented x 3 today.   HEENT-head normocephalic, atraumatic, sclera clear, conjunctiva pink, hearing intact, trachea midline. Lungs- Clear to ausculation bilaterally, normal work of breathing Heart- irregular rate and rhythm, no murmurs, rubs or gallops  GI- soft, NT, ND, + BS Extremities- no clubbing, cyanosis, or edema MS- no significant deformity or atrophy Skin- no rash or lesion Psych- euthymic mood, full affect Neuro- strength and sensation are intact   EKG today demonstrates Atypical atrial flutter Vent. rate 117 BPM PR interval * ms QRS duration 104 ms QT/QTcB 374/521 ms   Echo 02/28/20 1. Left ventricular ejection fraction, by estimation, is 50 to 55%. Wanda  left ventricle has low normal function. Wanda left ventricle has no regional wall motion abnormalities. There is mild left ventricular hypertrophy. Left ventricular diastolic parameters are consistent with Grade I diastolic dysfunction (impaired relaxation).   2. Right ventricular systolic function is normal. Wanda right ventricular  size is normal. Tricuspid regurgitation signal is inadequate for assessing PA pressure.   3. Wanda mitral valve is grossly normal. Mild to moderate mitral valve  regurgitation.   4. Wanda aortic valve has been repaired/replaced. There is mild thickening of Wanda aortic valve. Aortic valve regurgitation is trivial.  There is a 21 mm Edwards pericardial valve present in Wanda aortic position. Procedure Date: 05/03/2016. Aortic valve mean  gradient measures 13.0 mmHg.   5. Wanda inferior vena cava is normal in size with greater than 50%  respiratory variability, suggesting right atrial pressure of 3 mmHg.   Comparison(s): A prior study was performed on 01/11/2019. Prior images reviewed side by side. LVEF has improved. Prosthetic aortic valve gradient is lower on today's study.  Epic notes reviewed.  Assessment and Plan: 1. Persistent atrial fibrillation/atrial flutter S/p ablation with Dr Johney Frame 06/28/18 and repeat afib and flutter ablation 10/20/20. S/p DCCV on 11/18/20 [name] back in atrial flutter with rapid rates. We discussed therapeutic options today. She will likely need AAD to maintain SR.  Recall she failed dofetilide with long QT. 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 Will start flecainide 50 mg BID since her EF normalized. D/w EP. Continue Eliquis 5 mg BID  Continue Toprol 12.5 mg daily   This patients CHA2DS2-VASc Score and unadjusted Ischemic Stroke Rate (% per year) is equal to 3.2 % stroke rate/year from a score of 3  2. Chronic combined systolic and diastolic CHF EF recovered  post ablation. No signs or symptoms of fluid overload.   Follow up in [facility] AF clinic next week for ECG after starting flecainide.    Jorja Loa PA-C Afib [facility] [address] [place], [place] [address] [phone]

## 2020-11-30 ENCOUNTER — Other Ambulatory Visit: Payer: Self-pay

## 2020-11-30 ENCOUNTER — Ambulatory Visit (HOSPITAL_COMMUNITY)
Admission: RE | Admit: 2020-11-30 | Discharge: 2020-11-30 | Disposition: A | Discharge status: Home / Self Care | Payer: Medicare PPO | Source: Ambulatory Visit | Attending: Physician Assistant | Admitting: Physician Assistant

## 2020-11-30 DIAGNOSIS — I447 Left bundle-branch block, unspecified: Secondary | ICD-10-CM | POA: Diagnosis not present

## 2020-11-30 DIAGNOSIS — Z79899 Other long term (current) drug therapy: Secondary | ICD-10-CM | POA: Diagnosis not present

## 2020-11-30 DIAGNOSIS — Z5181 Encounter for therapeutic drug level monitoring: Secondary | ICD-10-CM | POA: Insufficient documentation

## 2020-11-30 DIAGNOSIS — I4819 Other persistent atrial fibrillation: Secondary | ICD-10-CM | POA: Diagnosis not present

## 2020-11-30 MED ORDER — METOPROLOL SUCCINATE ER 25 MG PO TB24: 12.5000 mg | ORAL_TABLET | Freq: Every day | ORAL | 11 refills | Status: DC

## 2020-11-30 NOTE — Patient Instructions (Signed)
Stop flecainide 

## 2020-11-30 NOTE — Progress Notes (Signed)
Patient returns for ECG after starting flecainide. ECG shows afib HR 97, QRS 154, QTc 520. QRS has widened from 104 to 154 with new LBBB. Will stop flecainide today. Her options for AAD are very limited, she had previously failed dofetilide and amiodarone (side effects). Will have her f/u with primary EP. ? If she would be a candidate for convergent procedure.

## 2020-12-02 ENCOUNTER — Other Ambulatory Visit: Payer: Self-pay

## 2020-12-02 ENCOUNTER — Ambulatory Visit: Payer: Medicare PPO | Admitting: Internal Medicine

## 2020-12-02 VITALS — BP 110/72 | HR 123 | Ht 64.0 in | Wt 156.0 lb

## 2020-12-02 DIAGNOSIS — I43 Cardiomyopathy in diseases classified elsewhere: Secondary | ICD-10-CM

## 2020-12-02 DIAGNOSIS — N289 Disorder of kidney and ureter, unspecified: Secondary | ICD-10-CM | POA: Diagnosis not present

## 2020-12-02 DIAGNOSIS — I4819 Other persistent atrial fibrillation: Secondary | ICD-10-CM

## 2020-12-02 DIAGNOSIS — R000 Tachycardia, unspecified: Secondary | ICD-10-CM

## 2020-12-02 DIAGNOSIS — I428 Other cardiomyopathies: Secondary | ICD-10-CM

## 2020-12-02 MED ORDER — AMIODARONE HCL 200 MG PO TABS: 200.0000 mg | ORAL_TABLET | Freq: Every day | ORAL | 3 refills | Status: DC

## 2020-12-02 MED ORDER — METOPROLOL SUCCINATE ER 25 MG PO TB24: 12.5000 mg | ORAL_TABLET | Freq: Two times a day (BID) | ORAL | 3 refills | Status: DC

## 2020-12-02 NOTE — Progress Notes (Signed)
PCP: [place], [name], MD Primary Cardiologist: Dr Excell Seltzer Primary EP: Dr Danielle Rankin Wanda [name] is a 70 y.o. female who presents today for urgent electrophysiology followup.  She underwent AF ablation in August.  She has had ERAF.  Recent DCC was not successful.  + SOB with activity and fatigue.  Her sister and husband are with her today and are very concerned.  Today, she denies symptoms of palpitations, chest pain,  lower extremity edema, dizziness, presyncope, or syncope.  The patient is otherwise without complaint today.   Past Medical History:  Diagnosis Date   Anxiety    Arthritis 2018   hands have general aches   CHF (congestive heart failure) (HCC) 2018   has shortness of breath since CHF diagnosis and has increased recently; states this is reason for admission   Chronic systolic heart failure (HCC) 05/25/2016   tachycardia mediated,  resolved previously with sinus rhythm   Depression    Dyspnea 2018   Migraine    Nonischemic cardiomyopathy (HCC)    tachycardia mediated,  resolved previously with sinus rhythm   Persistent atrial fibrillation (HCC) 05/13/2016   PVC's (premature ventricular contractions) 05/25/2016   Holter 11/17 The basic rhythm is sinus. There are frequent PVCs, periods of ventricular bigeminy, and occasional ventricular couplets and triplets.   S/P AVR (aortic valve replacement)    Past Surgical History:  Procedure Laterality Date   AORTIC VALVE REPLACEMENT N/A 05/03/2016   Procedure: AORTIC VALVE REPLACEMENT (AVR) WITH MAGNA EASE PERICARDIAL BIOPROSTHESIS - AORTIC 21 MM MODEL 3300TFX;  Surgeon: [name], MD;  Location: [facility] OR;  Service: Open Heart Surgery;  Laterality: N/A;   ATRIAL FIBRILLATION ABLATION N/A 06/28/2018   Procedure: ATRIAL FIBRILLATION ABLATION;  Surgeon: Hillis Range, MD;  Location: [facility] INVASIVE CV LAB;  Service: Cardiovascular;  Laterality: N/A;   ATRIAL FIBRILLATION ABLATION N/A 10/20/2020   Procedure: ATRIAL FIBRILLATION ABLATION;   Surgeon: Hillis Range, MD;  Location: [facility] INVASIVE CV LAB;  Service: Cardiovascular;  Laterality: N/A;   CARDIOVERSION N/A 02/16/2018   Procedure: CARDIOVERSION;  Surgeon: Lars Masson, MD;  Location: [facility] ENDOSCOPY;  Service: Cardiovascular;  Laterality: N/A;   CARDIOVERSION N/A 08/02/2018   Procedure: CARDIOVERSION;  Surgeon: Chilton Si, MD;  Location: [facility] ENDOSCOPY;  Service: Cardiovascular;  Laterality: N/A;   CARDIOVERSION N/A 11/18/2020   Procedure: CARDIOVERSION;  Surgeon: Wendall Stade, MD;  Location: [facility] ENDOSCOPY;  Service: Cardiovascular;  Laterality: N/A;   EYE SURGERY  2005   Lasik   TEE WITHOUT CARDIOVERSION N/A 05/03/2016   Procedure: TRANSESOPHAGEAL ECHOCARDIOGRAM (TEE);  Surgeon: [name], MD;  Location[facility] OR;  Service: Open Heart Surgery;  Laterality: N/A;   TONSILLECTOMY AND ADENOIDECTOMY      ROS- all systems are reviewed and negatives except as per HPI above  Current Outpatient Medications  Medication Sig Dispense Refill   acetaminophen (TYLENOL) 500 MG tablet Take 500-1,000 mg by mouth every 6 (six) hours as needed (for pain.).     apixaban (ELIQUIS) 5 MG TABS tablet Take 1 tablet (5 mg total) by mouth 2 (two) times daily. 60 tablet 5   furosemide (LASIX) 20 MG tablet TAKE 1 TABLET EACH DAY. 90 tablet 3   LORazepam (ATIVAN) 0.5 MG tablet Take 0.5 mg by mouth daily as needed for anxiety.      melatonin 5 MG TABS Take 5 mg by mouth at bedtime as needed (sleep).     metoprolol succinate (TOPROL XL) 25 MG 24 hr tablet  Take 0.5 tablets (12.5 mg total) by mouth at bedtime. 30 tablet 11   Potassium 99 MG TABS Take 99 mg by mouth in the morning.     rizatriptan (MAXALT-MLT) 10 MG disintegrating tablet Take 10 mg by mouth as needed for migraine.     No current facility-administered medications for this visit.    Physical Exam: Vitals:   12/02/20 1536  BP: 110/72  Pulse: (!) 123  SpO2: 97%  Weight: 156 lb (70.8 kg)  Height: 5\' 4"  (1.626 m)    GEN-  The patient is well appearing, alert and oriented x 3 today.   Head- normocephalic, atraumatic Eyes-  Sclera clear, conjunctiva pink Ears- hearing intact Oropharynx- clear Lungs- Clear to ausculation bilaterally, normal work of breathing Heart- Regular rate and rhythm, no murmurs, rubs or gallops, PMI not laterally displaced GI- soft, NT, ND, + BS Extremities- no clubbing, cyanosis, or edema  Wt Readings from Last 3 Encounters:  12/02/20 156 lb (70.8 kg)  11/25/20 156 lb 6.4 oz (70.9 kg)  11/18/20 155 lb (70.3 kg)    EKG tracing ordered today is personally reviewed and shows afib, V rate 123 bpm, QRS 129 msec, incomplete LBBB  Assessment and Plan:  Persistent afib Presents post ablation with ERAF.  Denies procedure related complications.  AF clinic recent notes reflect that flecainide as attempted but was stopped due to QRS widening after several days.  She has previously had qt prolongation with tikosyn.  For this reason, I would also not advise sotalol. Unfortunately, our AAD option is limited to amiodarone.  We discussed at length today.  Risks of occular, thyroid, liver and lung toxicities were discussed.  She has done a lot of reading about amiodarone and is aware of potential adverse events including sun exposure/ skin changes.   She did take amiodarone briefly post ablation previously with good control of her afib.  She developed corneal deposits and "Halo" vision but was able to wean off of the medicine and had resolutions of these symptoms.  She is willing to take amiodarone for a short period of time while she heals from her prior ablation.  Risks, benefits and potential toxicities for medications prescribed and/or refilled reviewed with patient today.   Start amiodarone 200mg  BID x 2 weeks, then follow-up with the AF clinic for repeat cardioversion.  If still in sinus on 1 week visit post [facility], reduce amiodarone to 200mg  daily  I will see again in 2 months  Increase toprol  to 12.5mg  BID for rate control in the interim.  Very difficult situation.  She has medicine and ablation refractory afib.  She is at risk for recurrence of tachycardia mediated CM, hospitalization, and death.  A high level of decision making was required for this visit today.  MD, [facility] 12/02/2020 3:42 PM

## 2020-12-02 NOTE — Patient Instructions (Signed)
Medication Instructions:  Amiodarone 200 two times a day Increase Toprol 12.5 mg two times a day.  Your physician recommends that you continue on your current medications as directed. Please refer to the Current Medication list given to you today. *If you need a refill on your cardiac medications before your next appointment, please call your pharmacy*  Lab Work: None. If you have labs (blood work) drawn today and your tests are completely normal, you will receive your results only by: MyChart Message (if you have MyChart) OR A paper copy in the mail If you have any lab test that is abnormal or we need to change your treatment, we will call you to review the results.  Testing/Procedures: None.  Follow-Up: At [facility], you and your health needs are our priority.  As part of our continuing mission to provide you with exceptional heart care, we have created designated Provider Care Teams.  These Care Teams include your primary Cardiologist (physician) and Advanced Practice Providers (APPs -  Physician Assistants and Nurse Practitioners) who all work together to provide you with the care you need, when you need it.  Your physician wants you to follow-up in: Afib Clinic in two weeks. They will contact you, 02/03/21 with Dr. Johney Frame at 8:45 am  We recommend signing up for the patient portal called "MyChart".  Sign up information is provided on this After Visit Summary.  MyChart is used to connect with patients for Virtual Visits (Telemedicine).  Patients are able to view lab/test results, encounter notes, upcoming appointments, etc.  Non-urgent messages can be sent to your provider as well.   To learn more about what you can do with MyChart, go to [email or web address].    Any Other Special Instructions Will Be Listed Below (If Applicable).

## 2020-12-03 ENCOUNTER — Telehealth (HOSPITAL_COMMUNITY): Payer: Self-pay | Admitting: Physician Assistant

## 2020-12-03 NOTE — Telephone Encounter (Signed)
Called and left message for patient to call back.  Needs 2 week f/u appt with Jorja Loa, PA after starting amiodarone per Sheppard Evens, RN.

## 2020-12-07 NOTE — Telephone Encounter (Signed)
Called and left 2nd VM asking patient to call back to schedule 2 wk f/u appt.

## 2020-12-10 ENCOUNTER — Encounter (HOSPITAL_COMMUNITY): Payer: Self-pay

## 2020-12-10 NOTE — Telephone Encounter (Signed)
Patient returned my call and is agreeable to appt 12/17/20 with AFC.

## 2020-12-16 ENCOUNTER — Ambulatory Visit (HOSPITAL_BASED_OUTPATIENT_CLINIC_OR_DEPARTMENT_OTHER): Payer: Medicare PPO | Admitting: Internal Medicine

## 2020-12-17 ENCOUNTER — Other Ambulatory Visit: Payer: Self-pay

## 2020-12-17 ENCOUNTER — Ambulatory Visit (HOSPITAL_COMMUNITY)
Admission: RE | Admit: 2020-12-17 | Discharge: 2020-12-17 | Disposition: A | Discharge status: Home / Self Care | Payer: Medicare PPO | Source: Ambulatory Visit | Attending: Physician Assistant | Admitting: Physician Assistant

## 2020-12-17 ENCOUNTER — Encounter (HOSPITAL_COMMUNITY): Payer: Self-pay | Admitting: Physician Assistant

## 2020-12-17 VITALS — BP 102/70 | HR 58 | Ht 64.0 in | Wt 158.8 lb

## 2020-12-17 DIAGNOSIS — I351 Nonrheumatic aortic (valve) insufficiency: Secondary | ICD-10-CM | POA: Diagnosis not present

## 2020-12-17 DIAGNOSIS — T462X5D Adverse effect of other antidysrhythmic drugs, subsequent encounter: Secondary | ICD-10-CM | POA: Insufficient documentation

## 2020-12-17 DIAGNOSIS — Z6372 Alcoholism and drug addiction in family: Secondary | ICD-10-CM | POA: Diagnosis not present

## 2020-12-17 DIAGNOSIS — Z79899 Other long term (current) drug therapy: Secondary | ICD-10-CM | POA: Insufficient documentation

## 2020-12-17 DIAGNOSIS — H18009 Unspecified corneal deposit, unspecified eye: Secondary | ICD-10-CM | POA: Diagnosis not present

## 2020-12-17 DIAGNOSIS — Z87891 Personal history of nicotine dependence: Secondary | ICD-10-CM | POA: Insufficient documentation

## 2020-12-17 DIAGNOSIS — Z6379 Other stressful life events affecting family and household: Secondary | ICD-10-CM | POA: Diagnosis not present

## 2020-12-17 DIAGNOSIS — I4819 Other persistent atrial fibrillation: Secondary | ICD-10-CM | POA: Insufficient documentation

## 2020-12-17 DIAGNOSIS — D6869 Other thrombophilia: Secondary | ICD-10-CM | POA: Diagnosis not present

## 2020-12-17 DIAGNOSIS — Z7901 Long term (current) use of anticoagulants: Secondary | ICD-10-CM | POA: Insufficient documentation

## 2020-12-17 DIAGNOSIS — I5042 Chronic combined systolic (congestive) and diastolic (congestive) heart failure: Secondary | ICD-10-CM | POA: Insufficient documentation

## 2020-12-17 DIAGNOSIS — I420 Dilated cardiomyopathy: Secondary | ICD-10-CM | POA: Diagnosis not present

## 2020-12-17 MED ORDER — METOPROLOL SUCCINATE ER 25 MG PO TB24: 12.5000 mg | ORAL_TABLET | Freq: Every day | ORAL | 3 refills | Status: AC

## 2020-12-17 NOTE — Progress Notes (Signed)
Primary Care Physician: [place], [name], MD Referring Physician: Dr. Excell Seltzer Primary EP: Dr Danielle Rankin Wanda [name] is a 70 y.o. female with a h/o  aortic insufficiency, combined systolic and diastolic CHF, non-ischemic dilated CM. She underwent bioprosthetic AVR by Dr. Tyrone Sage in February 2018.  Postoperative course was complicated by atrial fibrillation with a rapid rate requiring IV amiodarone.  Echo in June 2019 demonstrated improved LV function with an EF of 55-60% and mild diastolic   She was seen by Dr. Excell Seltzer 02/01/18 after an admission to the hospital in [facility] with atrial fibrillation with rapid rate complicated by congestive heart failure.  She was started on Apixaban for anticoagulation and diltiazem and metoprolol for rate control.  Echo done in the hospital in [place], [place] demonstrated an EF of 45-50 and a normally functioning aortic valve prosthesis.  Elective cardioversion was arranged after 3 weeks of uninterrupted anticoagulation.  She underwent cardioversion on 02/16/2018 with restoration of normal sinus rhythm (after 4 attempts-120 J, 150 J, 200 J x 2).  She had f/u with Tereso Newcomer, PA on 12/20. She felt better for a few days but went back into afib with feeling more fatigued and short of breath.  She is now in the afib clinic, 12/23, to discuss options to restore SR. She does not feel terrible in afib but better in SR. She is dealing with a lot of stress with substance abuse in her son and her concerns for his health. He is currently living with the pt and her husband and going thru detox. She feels this is what made her go back in  afib so quickly. [name] thought that Tikosyn may be a good option but pt will have to check on cost of drug.  F/u in AF clinic 07/20/18. Patient is s/p AF ablation with Dr Johney Frame 06/28/18. Recall she failed Tikosyn 2/2 QT prolongation. She feels more heart racing over the last few days with increased SOB and fatigue. She reports that she  becomes SOB even just walking from one room to the next. She admits that she has significant life stressors right now with ill family members and the passing of the family dog. She brings in her Kardia strips which show paroxysms of afib with RVR and also SR. She does have some abdominal fullness and orthopnea which improved with PRN Lasix. No swallowing or groin issues.  F/u in AF clinic 08/13/18. S/p DCCV on 08/02/18. She reports that she feels improved with less SOB and more energy. She states her hands are not as swollen although she does have abdominal fullness at times. She has not taken an extra dose of Lasix. She reports that her Lourena Simmonds has shown SR ever since her DCCV.  Return to afib clinic 10/1. She was seen last by Dr. Johney Frame 3 months ago and is here for f/u. In that note he said to decrease amiodarone to 100 mg daily at this visit and eventually wean off. Pt states a couple of weeks ago she started noticing rainbows areound lights, dizziness, and nausea. 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 She has not had any recurrent afib.  Follow up in the AF clinic 07/02/20. Patient reports that she went into afib about one week ago with symptoms of heart racing. Her Kardia mobile device showed afib with HR ~140 bpm. She increased her metoprolol which has  helped slow her heart rate to 90s-100s. There were no specific triggers that she could identify.   Follow up in the AF clinic 07/21/20. Patient called the clinic to report she was no longer in persistent afib. She sent in Kardia mobile strips which showed some intermittent SR, DCCV cancelled. We reviewed her recent Kardia strips in office today which show several episodes of paroxysmal rapid afib.    Follow up in the AF clinic 11/17/20. Patient is s/p repeat afib and flutter ablation 10/20/20. She reports that she had some heart racing the week after  ablation but then went back to SR. Then on 11/05/20 she went out of rhythm and has been persistent since. She does feel more SOB while out of rhythm. There are no specific triggers that she could identify.   Follow up in the AF clinic 11/25/20. Patient is s/p DCCV on 11/18/20. She reports that on 11/22/20 she felt she was back in afib with rapid heart rates. There was no specific trigger.   Follow up in the AF clinic 12/17/20. Patient seen by Dr Johney Frame and restarted on amiodarone. Patient reports she converted to SR on 12/12/20. She is doing well on the medication presently. She does feel more fatigue on higher dose of BB.   Today, she denies symptoms of palpitations, chest pain, PND, lower extremity edema, dizziness, presyncope, syncope, or neurologic sequela. The patient is tolerating medications without difficulties and is otherwise without complaint today.  Past Medical History:  Diagnosis Date   Anxiety    Arthritis 2018   hands have general aches   CHF (congestive heart failure) (HCC) 2018   has shortness of breath since CHF diagnosis and has increased recently; states this is reason for admission   Chronic systolic heart failure (HCC) 05/25/2016   tachycardia mediated,  resolved previously with sinus rhythm   Depression    Dyspnea 2018   Migraine    Nonischemic cardiomyopathy (HCC)    tachycardia mediated,  resolved previously with sinus rhythm   Persistent atrial fibrillation (HCC) 05/13/2016   PVC's (premature ventricular contractions) 05/25/2016   Holter 11/17 The basic rhythm is sinus. There are frequent PVCs, periods of ventricular bigeminy, and occasional ventricular couplets and triplets.   S/P AVR (aortic valve replacement)    Past Surgical History:  Procedure Laterality Date   AORTIC VALVE REPLACEMENT N/A 05/03/2016   Procedure: AORTIC VALVE REPLACEMENT (AVR) WITH MAGNA EASE PERICARDIAL BIOPROSTHESIS - AORTIC 21 MM MODEL 3300TFX;  Surgeon: [name], MD;  Location: [facility] OR;   Service: Open Heart Surgery;  Laterality: N/A;   ATRIAL FIBRILLATION ABLATION N/A 06/28/2018   Procedure: ATRIAL FIBRILLATION ABLATION;  Surgeon: Hillis Range, MD;  Location: [facility] INVASIVE CV LAB;  Service: Cardiovascular;  Laterality: N/A;   ATRIAL FIBRILLATION ABLATION N/A 10/20/2020   Procedure: ATRIAL FIBRILLATION ABLATION;  Surgeon: Hillis Range, MD;  Location: [facility] INVASIVE CV LAB;  Service: Cardiovascular;  Laterality: N/A;   CARDIOVERSION N/A 02/16/2018   Procedure: CARDIOVERSION;  Surgeon: Lars Masson, MD;  Location: [facility] ENDOSCOPY;  Service: Cardiovascular;  Laterality: N/A;   CARDIOVERSION N/A 08/02/2018   Procedure: CARDIOVERSION;  Surgeon: Chilton Si, MD;  Location: [facility] ENDOSCOPY;  Service: Cardiovascular;  Laterality: N/A;   CARDIOVERSION N/A 11/18/2020   Procedure: CARDIOVERSION;  Surgeon: Wendall Stade, MD;  Location: [facility] ENDOSCOPY;  Service: Cardiovascular;  Laterality: N/A;   EYE SURGERY  2005   Lasik   TEE WITHOUT CARDIOVERSION N/A 05/03/2016   Procedure: TRANSESOPHAGEAL ECHOCARDIOGRAM (TEE);  Surgeon:  [name], MD;  Location: [facility] OR;  Service: Open Heart Surgery;  Laterality: N/A;   TONSILLECTOMY AND ADENOIDECTOMY      Current Outpatient Medications  Medication Sig Dispense Refill   acetaminophen (TYLENOL) 500 MG tablet Take 500-1,000 mg by mouth every 6 (six) hours as needed (for pain.).     amiodarone (PACERONE) 200 MG tablet Take 1 tablet (200 mg total) by mouth daily. 90 tablet 3   apixaban (ELIQUIS) 5 MG TABS tablet Take 1 tablet (5 mg total) by mouth 2 (two) times daily. 60 tablet 5   furosemide (LASIX) 20 MG tablet TAKE 1 TABLET EACH DAY. 90 tablet 3   LORazepam (ATIVAN) 0.5 MG tablet Take 0.5 mg by mouth daily as needed for anxiety.      melatonin 5 MG TABS Take 5 mg by mouth at bedtime as needed (sleep).     Potassium 99 MG TABS Take 99 mg by mouth in the morning.     rizatriptan (MAXALT-MLT) 10 MG disintegrating tablet Take 10 mg by mouth as needed  for migraine.     metoprolol succinate (TOPROL XL) 25 MG 24 hr tablet Take 0.5 tablets (12.5 mg total) by mouth daily. 90 tablet 3   No current facility-administered medications for this encounter.    Allergies  Allergen Reactions   Tikosyn [Dofetilide] Other (See Comments)    Prolonged QT, nonsustained torsades at low dose   Codeine Nausea And Vomiting   Vicodin [Hydrocodone-Acetaminophen] Nausea And Vomiting    Social History   Socioeconomic History   Marital status: Married    Spouse name: Not on file   Number of children: Not on file   Years of education: Not on file   Highest education level: Not on file  Occupational History   Not on file  Tobacco Use   Smoking status: Former   Smokeless tobacco: Never   Tobacco comments:    stopped in 70's while in college; smoked 1-2 years  Vaping Use   Vaping Use: Never used  Substance and Sexual Activity   Alcohol use: Yes    Alcohol/week: 1.0 - 2.0 standard drink    Types: 1 - 2 Glasses of wine per week    Comment: 1-2 glasses of wine; hasn't had wine for last month   Drug use: No   Sexual activity: Yes    Partners: Male    Birth control/protection: Other-see comments, Post-menopausal    Comment: vasectomy  Other Topics Concern   Not on file  Social History Narrative   Lives in [place]   retired   Social Determinants of Corporate investment banker Strain: Not on file  Food Insecurity: Not on file  Transportation Needs: Not on file  Physical Activity: Not on file  Stress: Not on file  Social Connections: Not on file  Intimate Partner Violence: Not on file    Family History  Problem Relation Age of Onset   Diabetes Mother    Ovarian cancer Maternal Grandmother 93   Breast cancer Sister 48   Congestive Heart Failure Maternal Grandfather    Heart disease Brother        valve replacement   Deep vein thrombosis Brother    Lung cancer Brother    Deep vein thrombosis Father    Deep vein thrombosis Paternal  Aunt        x2   Deep vein thrombosis Paternal Grandmother     ROS- All systems are reviewed and negative except as per  the HPI above  Physical Exam: Vitals:   12/17/20 1044  BP: 102/70  Pulse: (!) 58  Weight: 72 kg  Height: 5\' 4"  (1.626 m)     Wt Readings from Last 3 Encounters:  12/17/20 72 kg  12/02/20 70.8 kg  11/25/20 70.9 kg    Labs: Lab Results  Component Value Date   NA 138 11/17/2020   K 4.2 11/17/2020   CL 105 11/17/2020   CO2 26 11/17/2020   GLUCOSE 87 11/17/2020   BUN 19 11/17/2020   CREATININE 1.15 (H) 11/17/2020   CALCIUM 9.3 11/17/2020   MG 2.1 07/25/2018   Lab Results  Component Value Date   INR 1.8 07/07/2016   Lab Results  Component Value Date   CHOL 247 (H) 08/18/2015   HDL 108 08/18/2015   LDLCALC 124 08/18/2015   TRIG 73 08/18/2015    GEN- The patient is a well appearing female, alert and oriented x 3 today.   HEENT-head normocephalic, atraumatic, sclera clear, conjunctiva pink, hearing intact, trachea midline. Lungs- Clear to ausculation bilaterally, normal work of breathing Heart- Regular rate and rhythm, no murmurs, rubs or gallops  GI- soft, NT, ND, + BS Extremities- no clubbing, cyanosis, or edema MS- no significant deformity or atrophy Skin- no rash or lesion Psych- euthymic mood, full affect Neuro- strength and sensation are intact   EKG today demonstrates SB, 1st degree AV block, baseline ST-T changes Vent. rate 58 BPM PR interval 212 ms QRS duration 112 ms QT/QTcB 458/449 ms   Echo 02/28/20 1. Left ventricular ejection fraction, by estimation, is 50 to 55%. The  left ventricle has low normal function. The left ventricle has no regional wall motion abnormalities. There is mild left ventricular hypertrophy. Left ventricular diastolic parameters are consistent with Grade I diastolic dysfunction (impaired relaxation).   2. Right ventricular systolic function is normal. The right ventricular  size is normal. Tricuspid  regurgitation signal is inadequate for assessing PA pressure.   3. The mitral valve is grossly normal. Mild to moderate mitral valve  regurgitation.   4. The aortic valve has been repaired/replaced. There is mild thickening of the aortic valve. Aortic valve regurgitation is trivial. There is a 21 mm Edwards pericardial valve present in the aortic position. Procedure Date: 05/03/2016. Aortic valve mean  gradient measures 13.0 mmHg.   5. The inferior vena cava is normal in size with greater than 50%  respiratory variability, suggesting right atrial pressure of 3 mmHg.   Comparison(s): A prior study was performed on 01/11/2019. Prior images reviewed side by side. LVEF has improved. Prosthetic aortic valve gradient is lower on today's study.  Epic notes reviewed.  Assessment and Plan: 1. Persistent atrial fibrillation/atrial flutter S/p ablation with Dr 01/13/2019 06/28/18 and repeat afib and flutter ablation 10/20/20. Patient chemically converted with amiodarone. Continue amiodarone 200 mg daily. Will decrease Toprol to 12.5 mg daily.  Recall she failed dofetilide with long QT. 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 Had QRS widening on flecainide.  Continue Eliquis 5 mg BID   This patients CHA2DS2-VASc Score and unadjusted Ischemic Stroke Rate (% per year) is equal to 3.2 % stroke rate/year from a score of 3  2. Chronic combined systolic and diastolic CHF EF recovered  post ablation. No signs or symptoms of fluid overload.   Follow up with Dr 12/20/20 as scheduled.     Johney Frame PA-C Af[facility] [address] [place], [place] [phone]

## 2021-01-18 ENCOUNTER — Ambulatory Visit: Payer: Medicare PPO | Admitting: Internal Medicine

## 2021-02-03 ENCOUNTER — Ambulatory Visit: Payer: Medicare PPO | Admitting: Internal Medicine

## 2021-02-15 ENCOUNTER — Other Ambulatory Visit: Payer: Self-pay

## 2021-02-15 ENCOUNTER — Encounter: Payer: Self-pay | Admitting: Internal Medicine

## 2021-02-15 ENCOUNTER — Ambulatory Visit: Payer: Medicare PPO | Admitting: Internal Medicine

## 2021-02-15 VITALS — BP 132/88 | HR 84 | Ht 64.0 in | Wt 155.0 lb

## 2021-02-15 DIAGNOSIS — I4819 Other persistent atrial fibrillation: Secondary | ICD-10-CM

## 2021-02-15 DIAGNOSIS — R000 Tachycardia, unspecified: Secondary | ICD-10-CM | POA: Diagnosis not present

## 2021-02-15 DIAGNOSIS — I43 Cardiomyopathy in diseases classified elsewhere: Secondary | ICD-10-CM

## 2021-02-15 MED ORDER — AMIODARONE HCL 200 MG PO TABS: 100.0000 mg | ORAL_TABLET | Freq: Every day | ORAL | 0 refills | Status: DC

## 2021-02-15 NOTE — Patient Instructions (Addendum)
Medication Instructions:  Decrease Amiodarone to 100 mg daily for 6 weeks then stop (03/29/20) Your physician recommends that you continue on your current medications as directed. Please refer to the Current Medication list given to you today. *If you need a refill on your cardiac medications before your next appointment, please call your pharmacy*  Lab Work: None. If you have labs (blood work) drawn today and your tests are completely normal, you will receive your results only by: MyChart Message (if you have MyChart) OR A paper copy in the mail If you have any lab test that is abnormal or we need to change your treatment, we will call you to review the results.  Testing/Procedures: None.  Follow-Up: At [facility], you and your health needs are our priority.  As part of our continuing mission to provide you with exceptional heart care, we have created designated Provider Care Teams.  These Care Teams include your primary Cardiologist (physician) and Advanced Practice Providers (APPs -  Physician Assistants and Nurse Practitioners) who all work together to provide you with the care you need, when you need it.  Your physician wants you to follow-up in: 3 months with the Afib Clinic. They will contact you to schedule.    You will receive a reminder letter in the mail two months in advance. If you don't receive a letter, please call our office to schedule the follow-up appointment.  We recommend signing up for the patient portal called "MyChart".  Sign up information is provided on this After Visit Summary.  MyChart is used to connect with patients for Virtual Visits (Telemedicine).  Patients are able to view lab/test results, encounter notes, upcoming appointments, etc.  Non-urgent messages can be sent to your provider as well.   To learn more about what you can do with MyChart, go to [email or web address].    Any Other Special Instructions Will Be Listed Below (If Applicable).

## 2021-02-15 NOTE — Progress Notes (Signed)
PCP: [place], [name], MD Primary Cardiologist: Dr Excell Seltzer Primary EP: Dr Danielle Rankin Reames is a 70 y.o. female who presents today for routine electrophysiology followup.  Since last being seen in our clinic, the patient reports doing very well.   She has not had afib since starting amiodarone in September. Today, she denies symptoms of palpitations, chest pain, shortness of breath,  lower extremity edema, dizziness, presyncope, or syncope.  The patient is otherwise without complaint today.   Past Medical History:  Diagnosis Date   Anxiety    Arthritis 2018   hands have general aches   CHF (congestive heart failure) (HCC) 2018   has shortness of breath since CHF diagnosis and has increased recently; states this is reason for admission   Chronic systolic heart failure (HCC) 05/25/2016   tachycardia mediated,  resolved previously with sinus rhythm   Depression    Dyspnea 2018   Migraine    Nonischemic cardiomyopathy (HCC)    tachycardia mediated,  resolved previously with sinus rhythm   Persistent atrial fibrillation (HCC) 05/13/2016   PVC's (premature ventricular contractions) 05/25/2016   Holter 11/17 The basic rhythm is sinus. There are frequent PVCs, periods of ventricular bigeminy, and occasional ventricular couplets and triplets.   S/P AVR (aortic valve replacement)    Past Surgical History:  Procedure Laterality Date   AORTIC VALVE REPLACEMENT N/A 05/03/2016   Procedure: AORTIC VALVE REPLACEMENT (AVR) WITH MAGNA EASE PERICARDIAL BIOPROSTHESIS - AORTIC 21 MM MODEL 3300TFX;  Surgeon: [name], MD;  Location: [facility] OR;  Service: Open Heart Surgery;  Laterality: N/A;   ATRIAL FIBRILLATION ABLATION N/A 06/28/2018   Procedure: ATRIAL FIBRILLATION ABLATION;  Surgeon: Hillis Range, MD;  Location: [facility] INVASIVE CV LAB;  Service: Cardiovascular;  Laterality: N/A;   ATRIAL FIBRILLATION ABLATION N/A 10/20/2020   Procedure: ATRIAL FIBRILLATION ABLATION;  Surgeon: Hillis Range, MD;   Location: [facility] INVASIVE CV LAB;  Service: Cardiovascular;  Laterality: N/A;   CARDIOVERSION N/A 02/16/2018   Procedure: CARDIOVERSION;  Surgeon: Lars Masson, MD;  Location[facility] ENDOSCOPY;  Service: Cardiovascular;  Laterality: N/A;   CARDIOVERSION N/A 08/02/2018   Procedure: CARDIOVERSION;  Surgeon: Chilton Si, MD;  Location: [facility] ENDOSCOPY;  Service: Cardiovascular;  Laterality: N/A;   CARDIOVERSION N/A 11/18/2020   Procedure: CARDIOVERSION;  Surgeon: Wendall Stade, MD;  Location: [facility] ENDOSCOPY;  Service: Cardiovascular;  Laterality: N/A;   EYE SURGERY  2005   Lasik   TEE WITHOUT CARDIOVERSION N/A 05/03/2016   Procedure: TRANSESOPHAGEAL ECHOCARDIOGRAM (TEE);  Surgeon: [name], MD;  Location: [facility] OR;  Service: Open Heart Surgery;  Laterality: N/A;   TONSILLECTOMY AND ADENOIDECTOMY      ROS- all systems are reviewed and negatives except as per HPI above  Current Outpatient Medications  Medication Sig Dispense Refill   acetaminophen (TYLENOL) 500 MG tablet Take 500-1,000 mg by mouth every 6 (six) hours as needed (for pain.).     amiodarone (PACERONE) 200 MG tablet Take 1 tablet (200 mg total) by mouth daily. 90 tablet 3   apixaban (ELIQUIS) 5 MG TABS tablet Take 1 tablet (5 mg total) by mouth 2 (two) times daily. 60 tablet 5   furosemide (LASIX) 20 MG tablet TAKE 1 TABLET EACH DAY. 90 tablet 3   LORazepam (ATIVAN) 0.5 MG tablet Take 0.5 mg by mouth daily as needed for anxiety.      melatonin 5 MG TABS Take 5 mg by mouth at bedtime as needed (sleep).  metoprolol succinate (TOPROL XL) 25 MG 24 hr tablet Take 0.5 tablets (12.5 mg total) by mouth daily. 90 tablet 3   Potassium 99 MG TABS Take 99 mg by mouth in the morning.     rizatriptan (MAXALT-MLT) 10 MG disintegrating tablet Take 10 mg by mouth as needed for migraine.     No current facility-administered medications for this visit.    Physical Exam: Vitals:   02/15/21 1616  BP: 132/88  Pulse: 84  SpO2: 95%  Weight:  155 lb (70.3 kg)  Height: 5\' 4"  (1.626 m)    GEN- The patient is well appearing, alert and oriented x 3 today.   Head- normocephalic, atraumatic Eyes-  Sclera clear, conjunctiva pink Ears- hearing intact Oropharynx- clear Lungs- Clear to ausculation bilaterally, normal work of breathing Heart- Regular rate and rhythm, no murmurs, rubs or gallops, PMI not laterally displaced GI- soft, NT, ND, + BS Extremities- no clubbing, cyanosis, or edema  Wt Readings from Last 3 Encounters:  02/15/21 155 lb (70.3 kg)  12/17/20 158 lb 12.8 oz (72 kg)  12/02/20 156 lb (70.8 kg)    EKG tracing ordered today is personally reviewed and shows sinus  Assessment and Plan:  Persistent atrial fibrillation She did have ERAF early post ablation but is now much improved. reduce amiodarone to 100mg  daily today. Stop amiodarone in 6 weeks Chads2vasc score is 3.  Continue eliquis  Risks, benefits and potential toxicities for medications prescribed and/or refilled reviewed with patient today.   2. Tachycardia mediated CM It is important to maintain sinus long term  3. Stage III CRI Stable No change required today  Return to AF clinic in 3 months  Thompson Grayer MD, [facility] 02/15/2021 4:18 PM

## 2021-04-16 ENCOUNTER — Other Ambulatory Visit: Payer: Self-pay

## 2021-04-16 ENCOUNTER — Ambulatory Visit: Payer: Medicare PPO | Admitting: Cardiovascular Disease

## 2021-04-16 ENCOUNTER — Ambulatory Visit (HOSPITAL_COMMUNITY): Payer: Medicare PPO | Attending: Cardiovascular Disease

## 2021-04-16 ENCOUNTER — Encounter: Payer: Self-pay | Admitting: Cardiovascular Disease

## 2021-04-16 VITALS — BP 118/80 | HR 69 | Ht 64.0 in | Wt 154.8 lb

## 2021-04-16 DIAGNOSIS — I5042 Chronic combined systolic (congestive) and diastolic (congestive) heart failure: Secondary | ICD-10-CM | POA: Insufficient documentation

## 2021-04-16 DIAGNOSIS — I5022 Chronic systolic (congestive) heart failure: Secondary | ICD-10-CM

## 2021-04-16 DIAGNOSIS — I4819 Other persistent atrial fibrillation: Secondary | ICD-10-CM | POA: Insufficient documentation

## 2021-04-16 DIAGNOSIS — I359 Nonrheumatic aortic valve disorder, unspecified: Secondary | ICD-10-CM

## 2021-04-16 DIAGNOSIS — D6869 Other thrombophilia: Secondary | ICD-10-CM

## 2021-04-16 LAB — ECHOCARDIOGRAM COMPLETE
AR max vel: 1.13 cm2
AV Area VTI: 1.2 cm2
AV Area mean vel: 1.13 cm2
AV Mean grad: 14.6 mmHg
AV Peak grad: 27 mmHg
Ao pk vel: 2.6 m/s
Area-P 1/2: 2.83 cm2
MV M vel: 5.43 m/s
MV Peak grad: 117.9 mmHg
Radius: 0.6 cm
S' Lateral: 2.5 cm

## 2021-04-16 NOTE — Progress Notes (Signed)
Cardiology Office Note:    Date:  04/16/2021   ID:  Wanda [name], DOB April 03, 1950, MRN [id number]  PCP:  Martin[place], [name], MD   [place] Providers Cardiologist:  Sherren Mocha, MD     Referring MD: Martinique, [name], MD   Chief Complaint  Patient presents with   Atrial Fibrillation    History of Present Illness:    Wanda [name] is a 71 y.o. female with a hx of aortic valve disease and atrial fibrillation, presenting for follow-up evaluation.  The patient underwent bioprosthetic aortic valve replacement in 2018 for treatment of severe symptomatic aortic insufficiency.  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  She was treated with atrial fibrillation ablation in 2020 and she had a very good response to this.  Her LVEF improved to the 50 to 55% range.  She has had difficulty tolerating medical therapy including low-dose beta-blockade.  She was unable to tolerate Tikosyn because of QT prolongation.  She did well after her ablation until April 2022 when she developed recurrent atrial fibrillation.  She ultimately underwent A-fib ablation in August 2022.  Recurrent A-fib necessitated a course of amiodarone which she has now completed.  She presents today for follow-up evaluation.  The patient is here alone today.  She is doing very well.  She has had no further heart palpitations and feels like she is maintaining sinus rhythm.  She is completely off of amiodarone.  Her exertional dyspnea is unchanged.  She has recently joined [employer] and plans to start exercising again.  No chest pain, chest pressure, lightheadedness, or syncope.  Her medications are unchanged.  She denies bleeding problems on apixaban.  Past Medical History:  Diagnosis Date   Anxiety    Arthritis 2018   hands have general aches   CHF (congestive heart failure) ([place]) 2018   has shortness of breath since CHF  diagnosis and has increased recently; states this is reason for admission   Chronic systolic heart failure ([place]) 05/25/2016   tachycardia mediated,  resolved previously with sinus rhythm   Depression    Dyspnea 2018   Migraine    Nonischemic cardiomyopathy ([place])    tachycardia mediated,  resolved previously with sinus rhythm   Persistent atrial fibrillation ([place]) 05/13/2016   PVC's (premature ventricular contractions) 05/25/2016   Holter 11/17 The basic rhythm is sinus. There are frequent PVCs, periods of ventricular bigeminy, and occasional ventricular couplets and triplets.   S/P AVR (aortic valve replacement)     Past Surgical History:  Procedure Laterality Date   AORTIC VALVE REPLACEMENT N/A 05/03/2016   Procedure: AORTIC VALVE REPLACEMENT (AVR) WITH MAGNA EASE PERICARDIAL BIOPROSTHESIS - AORTIC 21 MM MODEL 3300TFX;  Surgeon: Grace Isaac, MD;  Location: [facility];  Service: Open Heart Surgery;  Laterality: N/A;   ATRIAL FIBRILLATION ABLATION N/A 06/28/2018   Procedure: ATRIAL FIBRILLATION ABLATION;  Surgeon: Thompson Grayer, MD;  Location: [facility] CV LAB;  Service: Cardiovascular;  Laterality: N/A;   ATRIAL FIBRILLATION ABLATION N/A 10/20/2020   Procedure: ATRIAL FIBRILLATION ABLATION;  Surgeon: Thompson Grayer, MD;  Location: [place] [facility];  Service: Cardiovascular;  Laterality: N/A;   CARDIOVERSION N/A 02/16/2018   Procedure: CARDIOVERSION;  Surgeon: Dorothy Spark, MD;  Location: [facility] ENDOSCOPY;  Service: Cardiovascular;  Laterality: N/A;   CARDIOVERSION N/A 08/02/2018   Procedure: CARDIOVERSION;  Surgeon: Skeet Latch, MD;  Location: [facility];  Service:  Cardiovascular;  Laterality: N/A;   CARDIOVERSION N/A 11/18/2020   Procedure: CARDIOVERSION;  Surgeon: Josue Hector, MD;  Location: [facility] ENDOSCOPY;  Service: Cardiovascular;  Laterality: N/A;   EYE SURGERY  2005   Lasik   TEE WITHOUT CARDIOVERSION N/A 05/03/2016   Procedure: TRANSESOPHAGEAL ECHOCARDIOGRAM (TEE);   Surgeon: Grace Isaac, MD;  Location: [facility];  Service: Open Heart Surgery;  Laterality: N/A;   TONSILLECTOMY AND ADENOIDECTOMY      Current Medications: Current Meds  Medication Sig   acetaminophen (TYLENOL) 500 MG tablet Take 500-1,000 mg by mouth every 6 (six) hours as needed (for pain.).   apixaban (ELIQUIS) 5 MG TABS tablet Take 1 tablet (5 mg total) by mouth 2 (two) times daily.   furosemide (LASIX) 20 MG tablet TAKE 1 TABLET EACH DAY.   melatonin 5 MG TABS Take 5 mg by mouth at bedtime as needed (sleep).   metoprolol succinate (TOPROL XL) 25 MG 24 hr tablet Take 0.5 tablets (12.5 mg total) by mouth daily.   Potassium 99 MG TABS Take 99 mg by mouth in the morning.   rizatriptan (MAXALT-MLT) 10 MG disintegrating tablet Take 10 mg by mouth as needed for migraine.     Allergies:   Tikosyn [dofetilide], Codeine, and Vicodin [hydrocodone-acetaminophen]   Social History   Socioeconomic History   Marital status: Married    Spouse name: Not on file   Number of children: Not on file   Years of education: Not on file   Highest education level: Not on file  Occupational History   Not on file  Tobacco Use   Smoking status: Former   Smokeless tobacco: Never   Tobacco comments:    stopped in 70's while in college; smoked 1-2 years  Vaping Use   Vaping Use: Never used  Substance and Sexual Activity   Alcohol use: Yes    Alcohol/week: 1.0 - 2.0 standard drink    Types: 1 - 2 Glasses of wine per week    Comment: 1-2 glasses of wine; hasn't had wine for last month   Drug use: No   Sexual activity: Yes    Partners: Male    Birth control/protection: Other-see comments, Post-menopausal    Comment: vasectomy  Other Topics Concern   Not on file  Social History Narrative   Lives in [place]   retired   Social Determinants of Radio broadcast assistant Strain: Not on file  Food Insecurity: Not on file  Transportation Needs: Not on file  Physical Activity: Not on file   Stress: Not on file  Social Connections: Not on file     Family History: The patient's family history includes Breast cancer (age of onset: 73) in her sister; Congestive Heart Failure in her maternal grandfather; Deep vein thrombosis in her brother, father, paternal aunt, and paternal grandmother; Diabetes in her mother; Heart disease in her brother; Lung cancer in her brother; Ovarian cancer (age of onset: 22) in her maternal grandmother.  ROS:   Please see the history of present illness.    All other systems reviewed and are negative.  EKGs/Labs/Other Studies Reviewed:    The following studies were reviewed today: Today's echo study is personally reviewed.  LV function appears grossly normal.  RV function appears grossly normal.  The patient's prosthetic aortic valve appears to be functioning normally with peak and mean gradients of 27 and 15 mmHg, respectively.  There is mild aortic insufficiency present.  There is moderate mitral regurgitation present.  EKG:  EKG is not ordered today.  Recent Labs: 10/08/2020: ALT 9 11/17/2020: BUN 19; Creatinine, Ser 1.15; Hemoglobin 13.0; Platelets 215; Potassium 4.2; Sodium 138  Recent Lipid Panel    Component Value Date/Time   CHOL 247 (H) 08/18/2015 1600   TRIG 73 08/18/2015 1600   HDL 108 08/18/2015 1600   CHOLHDL 2.3 08/18/2015 1600   VLDL 15 08/18/2015 1600   LDLCALC 124 08/18/2015 1600     Risk Assessment/Calculations:    CHA2DS2-VASc Score =     This indicates a  % annual risk of stroke. The patient's score is based upon:           Physical Exam:    VS:  BP 118/80    Pulse 69    Ht 5\' 4"  (1.626 m)    Wt 154 lb 12.8 oz (70.2 kg)    LMP 08/12/2004    SpO2 98%    BMI 26.57 kg/m     Wt Readings from Last 3 Encounters:  04/16/21 154 lb 12.8 oz (70.2 kg)  02/15/21 155 lb (70.3 kg)  12/17/20 158 lb 12.8 oz (72 kg)     GEN:  Well nourished, well developed in no acute distress HEENT: Normal NECK: No JVD; No carotid  bruits LYMPHATICS: No lymphadenopathy CARDIAC: RRR, 2/6 systolic ejection murmur at the right upper sternal border RESPIRATORY:  Clear to auscultation without rales, wheezing or rhonchi  ABDOMEN: Soft, non-tender, non-distended MUSCULOSKELETAL:  No edema; No deformity  SKIN: Warm and dry NEUROLOGIC:  Alert and oriented x 3 PSYCHIATRIC:  Normal affect   ASSESSMENT:    1. Persistent atrial fibrillation ([place])   2. Secondary hypercoagulable state ([place])   3. Chronic systolic heart failure ([place])   4. Aortic valve disease    PLAN:    In order of problems listed above:  Patient is doing well, maintaining sinus rhythm after ablation and a course of amiodarone.  Continue apixaban.  She would like to have amiodarone on hand in case she develops recurrent atrial fibrillation I think this is probably a good idea.  She will notify me if this occurs. Tolerating oral anticoagulation with apixaban.  Appropriate dosing with 5 mg twice daily.  No history of stroke or TIA.  No bleeding problems. LVEF has normalized.  Patient takes low-dose metoprolol succinate.  Intolerant to multidrug therapy, even low-dose Aldactone was not well-tolerated in the past. I reviewed her echo study.  I think her valve function appears normal and stable with normal gradients and mild aortic insufficiency as outlined above.     Overall the patient is doing very well and I will plan to see her back in 6 months.  She continues to have routine follow-up in the atrial fib clinic as well.      Medication Adjustments/Labs and Tests Ordered: Current medicines are reviewed at length with the patient today.  Concerns regarding medicines are outlined above.  No orders of the defined types were placed in this encounter.  No orders of the defined types were placed in this encounter.   Patient Instructions  Medication Instructions:  Your physician recommends that you continue on your current medications as directed. Please refer  to the Current Medication list given to you today.  *If you need a refill on your cardiac medications before your next appointment, please call your pharmacy*   Lab Work: NONE If you have labs (blood work) drawn today and your tests are completely normal, you will receive your results only by:  MyChart Message (if you have MyChart) OR A paper copy in the mail If you have any lab test that is abnormal or we need to change your treatment, we will call you to review the results.   Testing/Procedures: NONE   Follow-Up: At [facility], you and your health needs are our priority.  As part of our continuing mission to provide you with exceptional heart care, we have created designated Provider Care Teams.  These Care Teams include your primary Cardiologist (physician) and Advanced Practice Providers (APPs -  Physician Assistants and Nurse Practitioners) who all work together to provide you with the care you need, when you need it.  Your next appointment:   6 month(s)  The format for your next appointment:   In Person  Provider:   Sherren Mocha, MD    Thank you for allowing myself and [facility] to serve you, God Bless!!    Signed, Sherren Mocha, MD  04/16/2021 5:25 PM    [place]

## 2021-04-16 NOTE — Patient Instructions (Signed)
Medication Instructions:  Your physician recommends that you continue on your current medications as directed. Please refer to the Current Medication list given to you today.  *If you need a refill on your cardiac medications before your next appointment, please call your pharmacy*   Lab Work: NONE If you have labs (blood work) drawn today and your tests are completely normal, you will receive your results only by: [place] (if you have MyChart) OR A paper copy in the mail If you have any lab test that is abnormal or we need to change your treatment, we will call you to review the results.   Testing/Procedures: NONE   Follow-Up: At [facility], you and your health needs are our priority.  As part of our continuing mission to provide you with exceptional heart care, we have created designated Provider Care Teams.  These Care Teams include your primary Cardiologist (physician) and Advanced Practice Providers (APPs -  Physician Assistants and Nurse Practitioners) who all work together to provide you with the care you need, when you need it.  Your next appointment:   6 month(s)  The format for your next appointment:   In Person  Provider:   Sherren Mocha, MD    Thank you for allowing myself and [facility] to serve you, God Bless!!

## 2021-07-27 ENCOUNTER — Other Ambulatory Visit: Payer: Self-pay | Admitting: Cardiovascular Disease

## 2021-07-27 ENCOUNTER — Encounter: Payer: Self-pay | Admitting: Cardiovascular Disease

## 2021-07-27 DIAGNOSIS — I4819 Other persistent atrial fibrillation: Secondary | ICD-10-CM

## 2021-07-27 NOTE — Telephone Encounter (Signed)
Eliquis 5mg  refill request received. Patient is 71 years old, weight-70.2kg, Crea-1.15 on 11/17/2020, Diagnosis-Afib, and last seen by Dr. 01/17/2021 on 04/16/2021. Dose is appropriate based on dosing criteria. Will send in refill to requested pharmacy.   ?

## 2021-12-14 ENCOUNTER — Encounter: Payer: Self-pay | Admitting: Cardiovascular Disease

## 2021-12-14 MED ORDER — FUROSEMIDE 20 MG PO TABS: ORAL_TABLET | ORAL | 0 refills | Status: DC

## 2021-12-29 ENCOUNTER — Ambulatory Visit: Payer: Medicare PPO | Admitting: Physician Assistant

## 2022-01-25 ENCOUNTER — Other Ambulatory Visit: Payer: Self-pay | Admitting: Cardiovascular Disease

## 2022-01-25 DIAGNOSIS — I4819 Other persistent atrial fibrillation: Secondary | ICD-10-CM

## 2022-01-25 NOTE — Telephone Encounter (Signed)
Eliquis 5mg  refill request received. Patient is 71 years old, weight-70.2kg, Crea-1.15 on 11/17/2020-needs labs, Diagnosis-Afib, and last seen by Dr. 01/17/2021 on 04/16/2021 and has an appt on 04/07/2022. Dose is appropriate based on dosing criteria.   Pt needs updated labs. She has an appt pending in January with Dr. February. Will order labs for same day and place note on appt.

## 2022-04-07 ENCOUNTER — Ambulatory Visit: Payer: Medicare PPO | Attending: Cardiovascular Disease

## 2022-04-07 ENCOUNTER — Ambulatory Visit: Payer: Medicare PPO | Attending: Cardiovascular Disease | Admitting: Cardiovascular Disease

## 2022-04-07 ENCOUNTER — Encounter: Payer: Self-pay | Admitting: Cardiovascular Disease

## 2022-04-07 VITALS — BP 120/80 | HR 74 | Ht 64.0 in | Wt 145.0 lb

## 2022-04-07 DIAGNOSIS — I359 Nonrheumatic aortic valve disorder, unspecified: Secondary | ICD-10-CM

## 2022-04-07 DIAGNOSIS — I5042 Chronic combined systolic (congestive) and diastolic (congestive) heart failure: Secondary | ICD-10-CM

## 2022-04-07 DIAGNOSIS — I4819 Other persistent atrial fibrillation: Secondary | ICD-10-CM | POA: Diagnosis not present

## 2022-04-07 DIAGNOSIS — I5022 Chronic systolic (congestive) heart failure: Secondary | ICD-10-CM | POA: Diagnosis not present

## 2022-04-07 MED ORDER — RIZATRIPTAN BENZOATE 10 MG PO TBDP: 10.0000 mg | ORAL_TABLET | ORAL | 0 refills | Status: DC | PRN

## 2022-04-07 NOTE — Patient Instructions (Signed)
Medication Instructions:  REFILLED Maxalt for #12 tablets *If you need a refill on your cardiac medications before your next appointment, please call your pharmacy*   Lab Work: NONE If you have labs (blood work) drawn today and your tests are completely normal, you will receive your results only by: [place] (if you have MyChart) OR A paper copy in the mail If you have any lab test that is abnormal or we need to change your treatment, we will call you to review the results.  Testing/Procedures: ECHO (to be done in 1 year prior to appt) Your physician has requested that you have an echocardiogram. Echocardiography is a painless test that uses sound waves to create images of your heart. It provides your doctor with information about the size and shape of your heart and how well your heart's chambers and valves are working. This procedure takes approximately one hour. There are no restrictions for this procedure. Please do NOT wear cologne, perfume, aftershave, or lotions (deodorant is allowed). Please arrive 15 minutes prior to your appointment time.  Follow-Up: At [facility], you and your health needs are our priority.  As part of our continuing mission to provide you with exceptional heart care, we have created designated Provider Care Teams.  These Care Teams include your primary Cardiologist (physician) and Advanced Practice Providers (APPs -  Physician Assistants and Nurse Practitioners) who all work together to provide you with the care you need, when you need it.  Your next appointment:   1 year(s)  Provider:   Sherren Mocha, MD

## 2022-04-07 NOTE — Progress Notes (Signed)
Cardiology Office Note:    Date:  04/12/2022   ID:  Wanda [name], DOB November 12, 1950, MRN [id number]  PCP:  [place], [name], [name] Providers Cardiologist:  Sherren Mocha, MD     Referring MD: No ref. provider found   Chief Complaint  Patient presents with   Atrial Fibrillation    History of Present Illness:    Wanda [name] is a 72 y.o. female with a hx of aortic valve disease and atrial fibrillation, presenting for follow-up evaluation.  The patient underwent bioprosthetic aortic valve replacement in 2018 for treatment of severe symptomatic aortic insufficiency.  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  She was treated with atrial fibrillation ablation in 2020 and she had a very good response to this.  Her LVEF improved to the 50 to 55% range.  She has had difficulty tolerating medical therapy including low-dose beta-blockade.  She was unable to tolerate Tikosyn because of QT prolongation.  She did well after her ablation until April 2022 when she developed recurrent atrial fibrillation.  She ultimately underwent A-fib ablation in August 2022.  Recurrent A-fib necessitated a course of amiodarone but the patient is now off of all antiarrhythmic drug Rx.   She is here alone today. Continues to do well with no cardiac-related complaints. Today, she denies symptoms of palpitations, chest pain, shortness of breath, orthopnea, PND, lower extremity edema, dizziness, or syncope. Compliant with her medications.   Past Medical History:  Diagnosis Date   Anxiety    Arthritis 2018   hands have general aches   CHF (congestive heart failure) ([place]) 2018   has shortness of breath since CHF diagnosis and has increased recently; states this is reason for admission   Chronic systolic heart failure ([place]) 05/25/2016   tachycardia mediated,  resolved previously with sinus rhythm    Depression    Dyspnea 2018   Migraine    Nonischemic cardiomyopathy ([place])    tachycardia mediated,  resolved previously with sinus rhythm   Persistent atrial fibrillation ([place]) 05/13/2016   PVC's (premature ventricular contractions) 05/25/2016   Holter 11/17 The basic rhythm is sinus. There are frequent PVCs, periods of ventricular bigeminy, and occasional ventricular couplets and triplets.   S/P AVR (aortic valve replacement)     Past Surgical History:  Procedure Laterality Date   AORTIC VALVE REPLACEMENT N/A 05/03/2016   Procedure: AORTIC VALVE REPLACEMENT (AVR) WITH MAGNA EASE PERICARDIAL BIOPROSTHESIS - AORTIC 21 MM MODEL 3300TFX;  Surgeon: Grace Isaac, MD;  Location: [place];  Service: Open Heart Surgery;  Laterality: N/A;   ATRIAL FIBRILLATION ABLATION N/A 06/28/2018   Procedure: ATRIAL FIBRILLATION ABLATION;  Surgeon: Thompson Grayer, MD;  Location: [place] CV LAB;  Service: Cardiovascular;  Laterality: N/A;   ATRIAL FIBRILLATION ABLATION N/A 10/20/2020   Procedure: ATRIAL FIBRILLATION ABLATION;  Surgeon: Thompson Grayer, MD;  Location: [place] [facility];  Service: Cardiovascular;  Laterality: N/A;   CARDIOVERSION N/A 02/16/2018   Procedure: CARDIOVERSION;  Surgeon: Dorothy Spark, MD;  Location: [facility] ENDOSCOPY;  Service: Cardiovascular;  Laterality: N/A;   CARDIOVERSION N/A 08/02/2018   Procedure: CARDIOVERSION;  Surgeon: Skeet Latch, MD;  Location: [place];  Service: Cardiovascular;  Laterality: N/A;   CARDIOVERSION N/A 11/18/2020   Procedure: CARDIOVERSION;  Surgeon: Josue Hector, MD;  Location: [facility] ENDOSCOPY;  Service: Cardiovascular;  Laterality: N/A;   EYE SURGERY  2005  Lasik   TEE WITHOUT CARDIOVERSION N/A 05/03/2016   Procedure: TRANSESOPHAGEAL ECHOCARDIOGRAM (TEE);  Surgeon: [name], MD;  Location: [facility] OR;  Service: Open Heart Surgery;  Laterality: N/A;   TONSILLECTOMY AND ADENOIDECTOMY      Current Medications: Current Meds  Medication Sig    acetaminophen (TYLENOL) 500 MG tablet Take 500-1,000 mg by mouth every 6 (six) hours as needed (for pain.).   amphetamine-dextroamphetamine (ADDERALL) 20 MG tablet Take 20 mg by mouth 2 (two) times daily.   apixaban (ELIQUIS) 5 MG TABS tablet TAKE ONE TABLET BY MOUTH TWICE DAILY   furosemide (LASIX) 20 MG tablet TAKE 1 TABLET EACH DAY.   hydrOXYzine (VISTARIL) 25 MG capsule Take 25-50 mg by mouth as needed.   LORazepam (ATIVAN) 0.5 MG tablet Take 0.5 mg by mouth daily as needed for anxiety.   melatonin 5 MG TABS Take 5 mg by mouth at bedtime as needed (sleep).   metoprolol succinate (TOPROL XL) 25 MG 24 hr tablet Take 0.5 tablets (12.5 mg total) by mouth daily.   [DISCONTINUED] rizatriptan (MAXALT-MLT) 10 MG disintegrating tablet Take 10 mg by mouth as needed for migraine.     Allergies:   Tikosyn [dofetilide], Codeine, and Vicodin [hydrocodone-acetaminophen]   Social History   Socioeconomic History   Marital status: Married    Spouse name: Not on file   Number of children: Not on file   Years of education: Not on file   Highest education level: Not on file  Occupational History   Not on file  Tobacco Use   Smoking status: Former   Smokeless tobacco: Never   Tobacco comments:    stopped in 70's while in college; smoked 1-2 years  Vaping Use   Vaping Use: Never used  Substance and Sexual Activity   Alcohol use: Yes    Alcohol/week: 1.0 - 2.0 standard drink of alcohol    Types: 1 - 2 Glasses of wine per week    Comment: 1-2 glasses of wine; hasn't had wine for last month   Drug use: No   Sexual activity: Yes    Partners: Male    Birth control/protection: Other-see comments, Post-menopausal    Comment: vasectomy  Other Topics Concern   Not on file  Social History Narrative   Lives in [place]   retired   Social Determinants of Corporate investment banker Strain: Not on file  Food Insecurity: Not on file  Transportation Needs: Not on file  Physical Activity: Not on  file  Stress: Not on file  Social Connections: Not on file     Family History: The patient's family history includes Breast cancer (age of onset: 73) in her sister; Congestive Heart Failure in her maternal grandfather; Deep vein thrombosis in her brother, father, paternal aunt, and paternal grandmother; Diabetes in her mother; Heart disease in her brother; Lung cancer in her brother; Ovarian cancer (age of onset: 60) in her maternal grandmother.  ROS:   Please see the history of present illness.    All other systems reviewed and are negative.  EKGs/Labs/Other Studies Reviewed:    The following studies were reviewed today: Echo 04/16/2021:  1. Left ventricular ejection fraction, by estimation, is 60 to 65%. The  left ventricle has normal function. The left ventricle has no regional  wall motion abnormalities. There is mild concentric left ventricular  hypertrophy. Left ventricular diastolic  parameters were normal.   2. Right ventricular systolic function is normal. The right ventricular  size is  normal. There is normal pulmonary artery systolic pressure.   3. Left atrial size was severely dilated.   4. The mitral valve is normal in structure. Mild mitral valve  regurgitation. No evidence of mitral stenosis.   5. Bioprosthetic aortic valve gradient unchanged from 02/2020. The aortic  valve has been repaired/replaced. There is mild calcification of the  aortic valve. There is mild thickening of the aortic valve. Aortic valve  regurgitation is mild. Mild aortic  valve stenosis. There is a 21 mm pericardial valve present in the aortic  position. Aortic valve area, by VTI measures 1.20 cm. Aortic valve mean  gradient measures 14.6 mmHg. Aortic valve Vmax measures 2.60 m/s.   6. Aortic dilatation noted. There is borderline dilatation of the  ascending aorta, measuring 36 mm.   7. The inferior vena cava is normal in size with greater than 50%  respiratory variability, suggesting right  atrial pressure of 3 mmHg.   EKG:  EKG is ordered today.  The ekg ordered today demonstrates NSR 74 bpm, nonspecific ST abnormality, prolonged QT with QTc 497 ms  Recent Labs: 04/07/2022: BUN 20; Creatinine, Ser 1.27; Hemoglobin 14.0; Platelets 258; Potassium 4.2; Sodium 140  Recent Lipid Panel    Component Value Date/Time   CHOL 247 (H) 08/18/2015 1600   TRIG 73 08/18/2015 1600   HDL 108 08/18/2015 1600   CHOLHDL 2.3 08/18/2015 1600   VLDL 15 08/18/2015 1600   LDLCALC 124 08/18/2015 1600     Risk Assessment/Calculations:    CHA2DS2-VASc Score = 3   This indicates a 3.2% annual risk of stroke. The patient's score is based upon: CHF History: 1 HTN History: 0 Diabetes History: 0 Stroke History: 0 Vascular Disease History: 0 Age Score: 1 Gender Score: 1          Physical Exam:    VS:  BP 120/80   Pulse 74   Ht 5\' 4"  (1.626 m)   Wt 145 lb (65.8 kg)   LMP 08/12/2004   SpO2 97%   BMI 24.89 kg/m     Wt Readings from Last 3 Encounters:  04/07/22 145 lb (65.8 kg)  04/16/21 154 lb 12.8 oz (70.2 kg)  02/15/21 155 lb (70.3 kg)     GEN:  Well nourished, well developed in no acute distress HEENT: Normal NECK: No JVD; No carotid bruits LYMPHATICS: No lymphadenopathy CARDIAC: RRR, 2/6 early peaking ejection murmur at the RUSB, no diastolic murmur RESPIRATORY:  Clear to auscultation without rales, wheezing or rhonchi  ABDOMEN: Soft, non-tender, non-distended MUSCULOSKELETAL:  No edema; No deformity  SKIN: Warm and dry NEUROLOGIC:  Alert and oriented x 3 PSYCHIATRIC:  Normal affect   ASSESSMENT:    1. Persistent atrial fibrillation ([place])   2. Heart failure with improved ejection fraction (HFimpEF) ([place])   3. Aortic valve disease    PLAN:    In order of problems listed above:  Maintaining sinus rhythm after redo ablation in 2022. Continue low-dose metoprolol succinate 12.5 mg daily and apixaban for oral anticoagulation. Follow-up one year.  LVEF has normalized  with restoration of sinus rhythm. Continue low dose metoprolol succinate. Normal function of her aortic prosthesis as outlined by her echo above with a mean gradient of 15 mmHg and no PVL. Repeat 2D Echo prior to next year's exam. Follow SBE prophylaxis as instructed.            Medication Adjustments/Labs and Tests Ordered: Current medicines are reviewed at length with the patient today.  Concerns regarding  medicines are outlined above.  Orders Placed This Encounter  Procedures   EKG 12-Lead   ECHOCARDIOGRAM COMPLETE   Meds ordered this encounter  Medications   rizatriptan (MAXALT-MLT) 10 MG disintegrating tablet    Sig: Take 1 tablet (10 mg total) by mouth as needed for migraine.    Dispense:  12 tablet    Refill:  0    Patient Instructions  Medication Instructions:  REFILLED Maxalt for #12 tablets *If you need a refill on your cardiac medications before your next appointment, please call your pharmacy*   Lab Work: NONE If you have labs (blood work) drawn today and your tests are completely normal, you will receive your results only by: MyChart Message (if you have MyChart) OR A paper copy in the mail If you have any lab test that is abnormal or we need to change your treatment, we will call you to review the results.  Testing/Procedures: ECHO (to be done in 1 year prior to appt) Your physician has requested that you have an echocardiogram. Echocardiography is a painless test that uses sound waves to create images of your heart. It provides your doctor with information about the size and shape of your heart and how well your heart's chambers and valves are working. This procedure takes approximately one hour. There are no restrictions for this procedure. Please do NOT wear cologne, perfume, aftershave, or lotions (deodorant is allowed). Please arrive 15 minutes prior to your appointment time.  Follow-Up: At [facility], you and your health needs are our  priority.  As part of our continuing mission to provide you with exceptional heart care, we have created designated Provider Care Teams.  These Care Teams include your primary Cardiologist (physician) and Advanced Practice Providers (APPs -  Physician Assistants and Nurse Practitioners) who all work together to provide you with the care you need, when you need it.  Your next appointment:   1 year(s)  Provider:   Tonny Bollman, MD        Signed, Tonny Bollman, MD  04/12/2022 9:22 AM    [facility]

## 2022-04-08 LAB — CBC
Hematocrit: 40.6 % (ref 34.0–46.6)
Hemoglobin: 14 g/dL (ref 11.1–15.9)
MCH: 31.6 pg (ref 26.6–33.0)
MCHC: 34.5 g/dL (ref 31.5–35.7)
MCV: 92 fL (ref 79–97)
Platelets: 258 10*3/uL (ref 150–450)
RBC: 4.43 x10E6/uL (ref 3.77–5.28)
RDW: 11.7 % (ref 11.7–15.4)
WBC: 5.3 10*3/uL (ref 3.4–10.8)

## 2022-04-08 LAB — BASIC METABOLIC PANEL
BUN/Creatinine Ratio: 16 (ref 12–28)
BUN: 20 mg/dL (ref 8–27)
CO2: 23 mmol/L (ref 20–29)
Calcium: 9.6 mg/dL (ref 8.7–10.3)
Chloride: 100 mmol/L (ref 96–106)
Creatinine, Ser: 1.27 mg/dL — ABNORMAL HIGH (ref 0.57–1.00)
Glucose: 95 mg/dL (ref 70–99)
Potassium: 4.2 mmol/L (ref 3.5–5.2)
Sodium: 140 mmol/L (ref 134–144)
eGFR: 46 mL/min/{1.73_m2} — ABNORMAL LOW (ref >59)

## 2022-04-12 ENCOUNTER — Encounter: Payer: Self-pay | Admitting: Cardiovascular Disease

## 2022-04-20 ENCOUNTER — Other Ambulatory Visit: Payer: Self-pay | Admitting: Cardiovascular Disease

## 2022-04-20 DIAGNOSIS — I4819 Other persistent atrial fibrillation: Secondary | ICD-10-CM

## 2022-04-20 NOTE — Telephone Encounter (Signed)
Prescription refill request for Eliquis received. Indication: AF Last office visit: 04/07/22  Ezzie Dural MD Scr: 1.27 on 04/07/22 Age: 72 Weight: 65.8kg  Based on above findings Eliquis 5mg  twice daily is the appropriate dose.  Refill approved.

## 2022-05-16 ENCOUNTER — Other Ambulatory Visit: Payer: Self-pay | Admitting: Cardiovascular Disease

## 2022-05-16 MED ORDER — FUROSEMIDE 20 MG PO TABS: ORAL_TABLET | ORAL | 3 refills | Status: DC

## 2022-10-19 ENCOUNTER — Other Ambulatory Visit: Payer: Self-pay | Admitting: Cardiovascular Disease

## 2022-10-19 DIAGNOSIS — I4819 Other persistent atrial fibrillation: Secondary | ICD-10-CM

## 2022-10-19 NOTE — Telephone Encounter (Signed)
Prescription refill request for Eliquis received. Indication: Afib  Last office visit: 04/07/22 Excell Seltzer)  Scr: 1.27 (04/07/22)  Age: 72 Weight: 65.8kg  Appropriate dose. Refill sent.

## 2023-02-14 ENCOUNTER — Ambulatory Visit (HOSPITAL_COMMUNITY): Payer: Medicare PPO | Attending: Cardiology

## 2023-02-14 DIAGNOSIS — I4819 Other persistent atrial fibrillation: Secondary | ICD-10-CM

## 2023-02-14 DIAGNOSIS — I5032 Chronic diastolic (congestive) heart failure: Secondary | ICD-10-CM | POA: Diagnosis not present

## 2023-02-14 LAB — ECHOCARDIOGRAM COMPLETE
AV Mean grad: 14.6 mm[Hg]
AV Peak grad: 23.7 mm[Hg]
Ao pk vel: 2.43 m/s
Area-P 1/2: 3.21 cm2
P 1/2 time: 520 ms
S' Lateral: 2.7 cm

## 2023-04-06 ENCOUNTER — Ambulatory Visit: Payer: Medicare PPO | Attending: Cardiovascular Disease | Admitting: Cardiovascular Disease

## 2023-04-06 ENCOUNTER — Encounter: Payer: Self-pay | Admitting: Cardiovascular Disease

## 2023-04-06 VITALS — BP 122/84 | HR 57 | Ht 64.0 in | Wt 148.2 lb

## 2023-04-06 DIAGNOSIS — I359 Nonrheumatic aortic valve disorder, unspecified: Secondary | ICD-10-CM

## 2023-04-06 DIAGNOSIS — I5032 Chronic diastolic (congestive) heart failure: Secondary | ICD-10-CM

## 2023-04-06 DIAGNOSIS — I4819 Other persistent atrial fibrillation: Secondary | ICD-10-CM | POA: Diagnosis not present

## 2023-04-06 MED ORDER — RIZATRIPTAN BENZOATE 10 MG PO TBDP: 10.0000 mg | ORAL_TABLET | ORAL | 0 refills | Status: AC | PRN

## 2023-04-06 NOTE — Patient Instructions (Signed)
Medication Instructions:  Rizatriptan has been refilled *If you need a refill on your cardiac medications before your next appointment, please call your pharmacy*   Lab Work: CBC, CMET and FASTING lipids If you have labs (blood work) drawn today and your tests are completely normal, you will receive your results only by: MyChart Message (if you have MyChart) OR A paper copy in the mail If you have any lab test that is abnormal or we need to change your treatment, we will call you to review the results.  Follow-Up: At [facility], you and your health needs are our priority.  As part of our continuing mission to provide you with exceptional heart care, we have created designated Provider Care Teams.  These Care Teams include your primary Cardiologist (physician) and Advanced Practice Providers (APPs -  Physician Assistants and Nurse Practitioners) who all work together to provide you with the care you need, when you need it.  We recommend signing up for the patient portal called "MyChart".  Sign up information is provided on this After Visit Summary.  MyChart is used to connect with patients for Virtual Visits (Telemedicine).  Patients are able to view lab/test results, encounter notes, upcoming appointments, etc.  Non-urgent messages can be sent to your provider as well.   To learn more about what you can do with MyChart, go to [email or web address].    Your next appointment:   1 year(s)  Provider:   Tonny Bollman, MD     Other Instructions   1st Floor: - Lobby - Registration  - Pharmacy  - Lab - Cafe  2nd Floor: - PV Lab - Diagnostic Testing (echo, CT, nuclear med)  3rd Floor: - Vacant  4th Floor: - TCTS (cardiothoracic surgery) - AFib Clinic - Structural Heart Clinic - Vascular Surgery  - Vascular Ultrasound  5th Floor: - HeartCare Cardiology (general and EP) - Clinical Pharmacy for coumadin, hypertension, lipid, weight-loss medications, and med  management appointments    Valet parking services will be available as well.

## 2023-04-06 NOTE — Progress Notes (Signed)
Cardiology Office Note:    Date:  04/13/2023   ID:  Wanda [name], DOB 09-Feb-1951, MRN [id number]  PCP:  [place], [name], MD   [facility] Providers Cardiologist:  Tonny Bollman, MD     Referring MD: [place], [name], MD   Chief Complaint  Patient presents with   Atrial Fibrillation    History of Present Illness:    Wanda [name] is a 73 y.o. female with a hx of aortic valve disease and atrial fibrillation, presenting for follow-up evaluation.  The patient underwent bioprosthetic aortic valve replacement in 2018 for treatment of severe symptomatic aortic insufficiency.  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  She was treated with atrial fibrillation ablation in 2020 and she had a very good response to this.  Her LVEF improved to the 50 to 55% range.  She has had difficulty tolerating medical therapy including low-dose beta-blockade.  She was unable to tolerate Tikosyn because of QT prolongation.  She did well after her ablation until April 2022 when she developed recurrent atrial fibrillation.  She ultimately underwent A-fib ablation in August 2022.  Recurrent A-fib necessitated a course of amiodarone but the patient is now off of all antiarrhythmic drug Rx.   The patient is here alone today.  She has been doing very well and reports no recent cardiac-related symptoms.  She denies chest pain, chest pressure, shortness of breath, heart palpitations, or leg swelling.  She is compliant with her medications.  Current Medications: Current Meds  Medication Sig   acetaminophen (TYLENOL) 500 MG tablet Take 500-1,000 mg by mouth every 6 (six) hours as needed (for pain.).   amphetamine-dextroamphetamine (ADDERALL) 20 MG tablet Take 20 mg by mouth 2 (two) times daily.   ELIQUIS 5 MG TABS tablet TAKE ONE TABLET BY MOUTH TWICE DAILY   furosemide (LASIX) 20 MG tablet TAKE 1 TABLET  EACH DAY.   LORazepam (ATIVAN) 0.5 MG tablet Take 0.5 mg by mouth daily as needed for anxiety.   metoprolol succinate (TOPROL XL) 25 MG 24 hr tablet Take 0.5 tablets (12.5 mg total) by mouth daily.   [DISCONTINUED] rizatriptan (MAXALT-MLT) 10 MG disintegrating tablet Take 1 tablet (10 mg total) by mouth as needed for migraine.     Allergies:   Tikosyn [dofetilide], Codeine, and Vicodin [hydrocodone-acetaminophen]   ROS:   Please see the history of present illness.    All other systems reviewed and are negative.  EKGs/Labs/Other Studies Reviewed:    The following studies were reviewed today: Cardiac Studies & Procedures   CARDIAC CATHETERIZATION  CARDIAC CATHETERIZATION 04/05/2016  Narrative 1. Severe aortic valve regurgitation 2. Widely patent coronary arteries with minimal irregularity of the LAD at the origin of the first diagonal 3. Normal right heart hemodynamics  Findings Coronary Findings Diagnostic  Dominance: Right  Left Anterior Descending The lesion is calcified. there is minimal irregularity of the proximal LAD at the origin of the first diagonal with associated calcification.  Ramus Intermedius Vessel is angiographically normal.  Left Circumflex Vessel is angiographically normal.  Right Coronary Artery Vessel is angiographically normal.  Intervention  No interventions have been documented.    ECHOCARDIOGRAM  ECHOCARDIOGRAM COMPLETE 02/14/2023  Narrative ECHOCARDIOGRAM REPORT    Patient Name:   Wanda [name]  Date of Exam: 02/14/2023 Medical Rec #:  [id number]     Height:       64.0 in Accession #:  [id number]    Weight:       145.0 lb Date of Birth:  October 12, 1950      BSA:          1.706 m Patient Age:    72 years      BP:           120/80 mmHg Patient Gender: F             HR:           74 bpm. Exam Location:  [address]  Procedure: 2D Echo, Cardiac Doppler and Color Doppler  Indications:    I50.32 Heart Failure with Improved Ejection  Fraction Z95.2 Aortic Valve Replacement  History:        Patient has prior history of Echocardiogram examinations, most recent 04/16/2021. CHF, AVR; Signs/Symptoms:Dyspnea. Nonischemic cardiomyopathy. PVC's. Aortic Valve: 21 mm Magna Ease pericardial valve is present in the aortic position. Procedure Date: 05/03/16.  Sonographer:    Sedonia Small Rodgers-Jones RDCS Referring Phys: [id number] Terita Hejl  IMPRESSIONS   1. S/P AVR with mean gradient 15 mmHg and trace AI. 2. Left ventricular ejection fraction, by estimation, is 60 to 65%. The left ventricle has normal function. The left ventricle has no regional wall motion abnormalities. Left ventricular diastolic parameters are consistent with Grade II diastolic dysfunction (pseudonormalization). 3. Right ventricular systolic function is normal. The right ventricular size is normal. 4. Left atrial size was mildly dilated. 5. The mitral valve is normal in structure. Mild mitral valve regurgitation. No evidence of mitral stenosis. 6. The aortic valve has been repaired/replaced. Aortic valve regurgitation is trivial. No aortic stenosis is present. There is a 21 mm Magna Ease pericardial valve present in the aortic position. Procedure Date: 05/03/16. 7. The inferior vena cava is normal in size with greater than 50% respiratory variability, suggesting right atrial pressure of 3 mmHg.  Comparison(s): No significant change from prior study.  FINDINGS Left Ventricle: Left ventricular ejection fraction, by estimation, is 60 to 65%. The left ventricle has normal function. The left ventricle has no regional wall motion abnormalities. The left ventricular internal cavity size was normal in size. There is no left ventricular hypertrophy. Left ventricular diastolic parameters are consistent with Grade II diastolic dysfunction (pseudonormalization).  Right Ventricle: The right ventricular size is normal. Right ventricular systolic function is normal.  Left  Atrium: Left atrial size was mildly dilated.  Right Atrium: Right atrial size was normal in size.  Pericardium: There is no evidence of pericardial effusion.  Mitral Valve: The mitral valve is normal in structure. Mild mitral valve regurgitation. No evidence of mitral valve stenosis.  Tricuspid Valve: The tricuspid valve is normal in structure. Tricuspid valve regurgitation is trivial. No evidence of tricuspid stenosis.  The aortic valve has been repaired/replaced. Aortic valve regurgitation is trivial. No aortic stenosis is present. There is a 21 mm Magna Ease pericardial valve present in the aortic position. Procedure Date: 05/03/16. Pulmonic Valve: The pulmonic valve was normal in structure. Pulmonic valve regurgitation is trivial. No evidence of pulmonic stenosis.  Aorta: The aortic root is normal in size and structure.  Venous: The inferior vena cava is normal in size with greater than 50% respiratory variability, suggesting right atrial pressure of 3 mmHg.  IAS/Shunts: No atrial level shunt detected by color flow Doppler.  Additional Comments: S/P AVR with mean gradient 15 mmHg and trace AI.   LEFT VENTRICLE PLAX 2D LVIDd:         4.40 cm Diastology LVIDs:  2.70 cm LV e' medial:    6.48 cm/s LV PW:         0.90 cm LV E/e' medial:  13.7 LV IVS:        0.90 cm LV e' lateral:   9.36 cm/s LV E/e' lateral: 9.5   RIGHT VENTRICLE             IVC RV Basal diam:  3.60 cm     IVC diam: 1.20 cm RV S prime:     10.55 cm/s TAPSE (M-mode): 1.7 cm  LEFT ATRIUM             Index        RIGHT ATRIUM          Index LA diam:        4.00 cm 2.34 cm/m   RA Area:     9.85 cm LA Vol (A2C):   65.1 ml 38.15 ml/m  RA Volume:   20.10 ml 11.78 ml/m LA Vol (A4C):   63.7 ml 37.33 ml/m LA Biplane Vol: 64.8 ml 37.97 ml/m AORTIC VALVE AV Vmax:           243.20 cm/s AV Vmean:          178.400 cm/s AV VTI:            0.499 m AV Peak Grad:      23.7 mmHg AV Mean Grad:      14.6 mmHg LVOT  Vmax:         86.27 cm/s LVOT Vmean:        57.300 cm/s LVOT VTI:          0.177 m LVOT/AV VTI ratio: 0.35 AI PHT:            520 msec  AORTA Ao Root diam: 2.90 cm Ao Asc diam:  3.60 cm  MITRAL VALVE MV Area (PHT): 3.21 cm    SHUNTS MV Decel Time: 236 msec    Systemic VTI: 0.18 m MV E velocity: 88.60 cm/s MV A velocity: 76.25 cm/s MV E/A ratio:  1.16  Olga Millers MD Electronically signed by Olga Millers MD Signature Date/Time: 02/14/2023/11:55:25 AM    Final  TEE  ECHO TEE 06/28/2018  Narrative TRANSESOPHOGEAL ECHO REPORT    Patient Name:   Wanda [name] Date of Exam: 06/28/2018 Medical Rec #:  [id number]    Height:       64.0 in Accession #:    [id number]   Weight:       151.2 lb Date of Birth:  Oct 06, 1950     BSA:          1.74 m Patient Age:    68 years     BP:           94/75 mmHg Patient Gender: F            HR:           92 bpm. Exam Location:  Inpatient   Procedure: Transesophageal Echo  Indications:     Atrial fibrillation 427.31/I48.91  History:         Patient has prior history of Echocardiogram examinations, most recent 06/26/2018. CHF Atrial Fibrillation and PVC Aortic Valve Disease and Mitral Valve Disease Risk Factors: Former Smoker. Aortic Valve: A bioprosthetic aortic valve  Sonographer:     Ross Ludwig RDCS (AE) Referring Phys:  [id number] Faustino Congress NELSON Diagnosing Phys: [name] MD    PROCEDURE: Patients was monitored while under deep sedation. The  transesophogeal probe was passed through the esophogus of the patient. Image quality was excellent. The patient developed no complications during the procedure.  IMPRESSIONS   1. The left ventricle has severely reduced systolic function, with an ejection fraction of 20-25%. The cavity size was mildly dilated. Left ventricular diastolic function could not be evaluated. 2. The right ventricle has moderately reduced systolic function. Right ventricular systolic pressure normal. 3. Left  atrial size was severely dilated. 4. Right atrial size was moderately dilated. 5. The mitral valve is grossly normal. Mild thickening of the mitral valve leaflet. Mitral valve regurgitation is moderate by color flow Doppler. 6. A bioprosthesis valve is present in the aortic position. 7. Aortic valve regurgitation is trivial by color flow Doppler.  SUMMARY  LVEF severely decreased estimated at 20-25%. Moderately decreased RVEF. Bi-Atrial dilatation. No thrombus in the left atrium or left atrial appendage. S/P AVR with a bioprosthesis, trivial AI. Moderate MR. Mild TR. FINDINGS Left Ventricle: The left ventricle has severely reduced systolic function, with an ejection fraction of 20-25%. The cavity size was mildly dilated. Left ventricular diastolic function could not be evaluated. Right Ventricle: The right ventricle has moderately reduced systolic function. Right ventricular systolic pressure normal. Left Atrium: Left atrial size was severely dilated. Right Atrium: Right atrial size was moderately dilated. Right atrial pressure is estimated at 10 mmHg. Mitral Valve: The mitral valve is grossly normal. Mild thickening of the mitral valve leaflet. Mitral valve regurgitation is moderate by color flow Doppler. Tricuspid Valve: The tricuspid valve was normal in structure. Tricuspid valve regurgitation is mild by color flow Doppler. Aortic Valve: Aortic valve regurgitation is trivial by color flow Doppler. A bioprosthetic aortic valve valve is present in the aortic position. Venous: The inferior vena cava was not well visualized.  [name] MD Electronically signed by [name] MD Signature Date/Time: 06/28/2018/2:42:09 PM    Final            EKG:   EKG Interpretation Date/Time:  Thursday April 06 2023 14:33:26 EST Ventricular Rate:  57 PR Interval:  158 QRS Duration:  118 QT Interval:  500 QTC Calculation: 486 R Axis:   -21  Text Interpretation: Sinus bradycardia  Incomplete left bundle branch block Minimal voltage criteria for LVH, may be normal variant ( Cornell product ) Nonspecific ST and T wave abnormality Prolonged QT When compared with ECG of 17-Dec-2020 10:47, PR interval has decreased Confirmed by Tonny Bollman [phone]) on 04/06/2023 2:52:04 PM    Recent Labs: No results found for requested labs within last 365 days.  Recent Lipid Panel    Component Value Date/Time   CHOL 247 (H) 08/18/2015 1600   TRIG 73 08/18/2015 1600   HDL 108 08/18/2015 1600   CHOLHDL 2.3 08/18/2015 1600   VLDL 15 08/18/2015 1600   LDLCALC 124 08/18/2015 1600     Risk Assessment/Calculations:    CHA2DS2-VASc Score = 3   This indicates a 3.2% annual risk of stroke. The patient's score is based upon: CHF History: 1 HTN History: 0 Diabetes History: 0 Stroke History: 0 Vascular Disease History: 0 Age Score: 1 Gender Score: 1         Physical Exam:    VS:  BP 122/84   Pulse (!) 57   Ht 5\' 4"  (1.626 m)   Wt 148 lb 3.2 oz (67.2 kg)   LMP 08/12/2004   SpO2 93%   BMI 25.44 kg/m     Wt Readings from Last 3 Encounters:  04/06/23 148  lb 3.2 oz (67.2 kg)  04/07/22 145 lb (65.8 kg)  04/16/21 154 lb 12.8 oz (70.2 kg)     GEN:  Well nourished, well developed in no acute distress HEENT: Normal NECK: No JVD; No carotid bruits LYMPHATICS: No lymphadenopathy CARDIAC: RRR, 2/6 systolic murmur at the right upper sternal border RESPIRATORY:  Clear to auscultation without rales, wheezing or rhonchi  ABDOMEN: Soft, non-tender, non-distended MUSCULOSKELETAL:  No edema; No deformity  SKIN: Warm and dry NEUROLOGIC:  Alert and oriented x 3 PSYCHIATRIC:  Normal affect   Assessment & Plan Persistent atrial fibrillation (HCC) Maintaining sinus rhythm post ablation.  Continue apixaban for anticoagulation. Heart failure with improved ejection fraction (HFimpEF) (HCC) Patient remains clinically stable on low-dose metoprolol succinate and furosemide.  She had  hypotension and with GDMT.  Her LVEF has completely normalized at 60 to 65% and LV dysfunction was related to atrial fibrillation and tachycardia mediated cardiomyopathy.  She has normal RV function.  She will continue current management. Aortic valve disease Patient remains clinically stable on aspirin.  I reviewed her recent echocardiogram demonstrating normal function of her aortic bioprosthesis with a mean gradient of 15 mmHg and trace AI.  She should follow SBE prophylaxis when indicated.  Follow-up 1 year.      Medication Adjustments/Labs and Tests Ordered: Current medicines are reviewed at length with the patient today.  Concerns regarding medicines are outlined above.  Orders Placed This Encounter  Procedures   CBC   Comprehensive metabolic panel   Lipid panel   EKG 12-Lead   Meds ordered this encounter  Medications   rizatriptan (MAXALT-MLT) 10 MG disintegrating tablet    Sig: Take 1 tablet (10 mg total) by mouth as needed for migraine.    Dispense:  12 tablet    Refill:  0    Patient Instructions  Medication Instructions:  Rizatriptan has been refilled *If you need a refill on your cardiac medications before your next appointment, please call your pharmacy*   Lab Work: CBC, CMET and FASTING lipids If you have labs (blood work) drawn today and your tests are completely normal, you will receive your results only by: MyChart Message (if you have MyChart) OR A paper copy in the mail If you have any lab test that is abnormal or we need to change your treatment, we will call you to review the results.  Follow-Up: At [facility], you and your health needs are our priority.  As part of our continuing mission to provide you with exceptional heart care, we have created designated Provider Care Teams.  These Care Teams include your primary Cardiologist (physician) and Advanced Practice Providers (APPs -  Physician Assistants and Nurse Practitioners) who all work together  to provide you with the care you need, when you need it.  We recommend signing up for the patient portal called "MyChart".  Sign up information is provided on this After Visit Summary.  MyChart is used to connect with patients for Virtual Visits (Telemedicine).  Patients are able to view lab/test results, encounter notes, upcoming appointments, etc.  Non-urgent messages can be sent to your provider as well.   To learn more about what you can do with MyChart, go to [email or web address].    Your next appointment:   1 year(s)  Provider:   Tonny Bollman, MD     Other Instructions   1st Floor: - Lobby - Registration  - Pharmacy  - Lab - Cafe  2nd Floor: - PV Lab - Diagnostic  Testing (echo, CT, nuclear med)  3rd Floor: - Vacant  4th Floor: - TCTS (cardiothoracic surgery) - AFib Clinic - Structural Heart Clinic - Vascular Surgery  - Vascular Ultrasound  5th Floor: - HeartCare Cardiology (general and EP) - Clinical Pharmacy for coumadin, hypertension, lipid, weight-loss medications, and med management appointments    Valet parking services will be available as well.          Signed, Tonny Bollman, MD  04/13/2023 6:21 AM    [facility]

## 2023-04-13 NOTE — Assessment & Plan Note (Signed)
Maintaining sinus rhythm post ablation.  Continue apixaban for anticoagulation.

## 2023-04-15 LAB — CBC
Hematocrit: 42.6 % (ref 34.0–46.6)
Hemoglobin: 14.1 g/dL (ref 11.1–15.9)
MCH: 31.3 pg (ref 26.6–33.0)
MCHC: 33.1 g/dL (ref 31.5–35.7)
MCV: 95 fL (ref 79–97)
Platelets: 233 10*3/uL (ref 150–450)
RBC: 4.51 x10E6/uL (ref 3.77–5.28)
RDW: 11.7 % (ref 11.7–15.4)
WBC: 3.9 10*3/uL (ref 3.4–10.8)

## 2023-04-15 LAB — COMPREHENSIVE METABOLIC PANEL
ALT: 12 [IU]/L (ref 0–32)
AST: 17 [IU]/L (ref 0–40)
Albumin: 4.8 g/dL (ref 3.9–4.9)
Alkaline Phosphatase: 70 [IU]/L (ref 44–121)
BUN/Creatinine Ratio: 14 (ref 12–28)
BUN: 17 mg/dL (ref 8–27)
Bilirubin Total: 0.5 mg/dL (ref 0.0–1.2)
CO2: 25 mmol/L (ref 20–29)
Calcium: 9.5 mg/dL (ref 8.7–10.3)
Chloride: 102 mmol/L (ref 96–106)
Creatinine, Ser: 1.24 mg/dL — ABNORMAL HIGH (ref 0.57–1.00)
Globulin, Total: 2.1 g/dL (ref 1.5–4.5)
Glucose: 105 mg/dL — ABNORMAL HIGH (ref 70–99)
Potassium: 4 mmol/L (ref 3.5–5.2)
Sodium: 142 mmol/L (ref 134–144)
Total Protein: 6.9 g/dL (ref 6.0–8.5)
eGFR: 47 mL/min/{1.73_m2} — ABNORMAL LOW (ref >59)

## 2023-04-15 LAB — LIPID PANEL
Chol/HDL Ratio: 2.8 {ratio} (ref 0.0–4.4)
Cholesterol, Total: 264 mg/dL — ABNORMAL HIGH (ref 100–199)
HDL: 93 mg/dL (ref >39)
LDL Chol Calc (NIH): 157 mg/dL — ABNORMAL HIGH (ref 0–99)
Triglycerides: 83 mg/dL (ref 0–149)
VLDL Cholesterol Cal: 14 mg/dL (ref 5–40)

## 2023-04-16 ENCOUNTER — Other Ambulatory Visit: Payer: Self-pay | Admitting: Cardiovascular Disease

## 2023-04-16 DIAGNOSIS — I4819 Other persistent atrial fibrillation: Secondary | ICD-10-CM

## 2023-04-17 NOTE — Telephone Encounter (Signed)
Prescription refill request for Eliquis received. Indication:afib Last office visit:1/25 Scr:1.24  1/25 Age: 73 Weight:67.2  kg  Prescription refilled

## 2023-07-14 ENCOUNTER — Other Ambulatory Visit: Payer: Self-pay | Admitting: Cardiovascular Disease

## 2023-09-11 ENCOUNTER — Other Ambulatory Visit: Payer: Self-pay | Admitting: Cardiovascular Disease

## 2023-10-12 ENCOUNTER — Other Ambulatory Visit: Payer: Self-pay | Admitting: Cardiovascular Disease

## 2023-10-12 DIAGNOSIS — I4819 Other persistent atrial fibrillation: Secondary | ICD-10-CM

## 2023-10-12 NOTE — Telephone Encounter (Signed)
 Pt last saw Dr Wonda 04/06/23, last labs 04/14/23 Creat 1.24, age 73, weight 67.2kg, based on specified criteria pt is on appropriate dosage of Eliquis  5mg  BID for afib.  Will refill rx.

## 2024-02-21 ENCOUNTER — Telehealth: Payer: Self-pay | Admitting: Cardiovascular Disease

## 2024-02-21 DIAGNOSIS — I4819 Other persistent atrial fibrillation: Secondary | ICD-10-CM

## 2024-02-21 MED ORDER — APIXABAN 5 MG PO TABS: 5.0000 mg | ORAL_TABLET | Freq: Two times a day (BID) | ORAL | 3 refills | Status: AC

## 2024-02-21 NOTE — Telephone Encounter (Signed)
 Rx for Eliquis  sent to pharmacy. Per note: Patient will check back in January to schedule follow-up appt with Dr. Wonda.

## 2024-02-21 NOTE — Telephone Encounter (Signed)
° °*  STAT* If patient is at the pharmacy, call can be transferred to refill team.   1. Which medications need to be refilled? (please list name of each medication and dose if known)   ELIQUIS  5 MG TABS tablet   2. Which pharmacy/location (including street and city if local pharmacy) is medication to be sent to?  [facility] [place], [place] - [address]    3. Do they need a 30 day or 90 day supply?  30 days  Patient as requested to schedule her f/u with Wonda but he will not have openings until April. Patient will check back with us  in January

## 2024-03-21 ENCOUNTER — Encounter: Payer: Self-pay | Admitting: Cardiovascular Disease

## 2024-04-02 ENCOUNTER — Encounter: Payer: Self-pay | Admitting: Cardiovascular Disease

## 2024-04-02 ENCOUNTER — Ambulatory Visit: Attending: Cardiovascular Disease | Admitting: Cardiovascular Disease

## 2024-04-02 VITALS — BP 108/78 | HR 77 | Ht 64.0 in | Wt 149.4 lb

## 2024-04-02 DIAGNOSIS — I5020 Unspecified systolic (congestive) heart failure: Secondary | ICD-10-CM | POA: Diagnosis not present

## 2024-04-02 DIAGNOSIS — I4819 Other persistent atrial fibrillation: Secondary | ICD-10-CM | POA: Diagnosis not present

## 2024-04-02 DIAGNOSIS — I359 Nonrheumatic aortic valve disorder, unspecified: Secondary | ICD-10-CM

## 2024-04-02 LAB — CBC

## 2024-04-02 NOTE — Assessment & Plan Note (Addendum)
 Well-controlled after redo ablation.  Continue apixaban  for anticoagulation.  Agree with staying off of metoprolol  with her symptomatic hypotension when she takes this.  She has always run a low blood pressure.

## 2024-04-02 NOTE — Progress Notes (Signed)
 " Cardiology Office Note:    Date:  04/02/2024   ID:  Wanda [name], DOB 05/03/1950, MRN [id number]  PCP:  Jordan, [name], MD    [facility] Providers Cardiologist:  Ozell Fell, MD     Referring MD: Jordan, [name], MD   Chief Complaint  Patient presents with   Atrial Fibrillation    History of Present Illness:    Wanda [name] is a 74 y.o. female with a hx of aortic valve disease and atrial fibrillation, presenting for follow-up evaluation.  The patient underwent bioprosthetic aortic valve replacement in 2018 for treatment of severe symptomatic aortic insufficiency.  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  She was treated with atrial fibrillation ablation in 2020 and she had a very good response to this.  Her LVEF improved to the 50 to 55% range.  She has had difficulty tolerating medical therapy including low-dose beta-blockade.  She was unable to tolerate Tikosyn  because of QT prolongation.  She did well after her ablation until 2022 when she developed recurrent atrial fibrillation.  She ultimately underwent redo A-fib ablation.  Recurrent A-fib necessitated a course of amiodarone  but the patient is now off of all antiarrhythmic drug Rx.   She is here alone today. She fell in October and had right rib fractures treated conservatively, struggling with pain for a good while but doing better now.  She has discontinued low-dose metoprolol  because of lightheadedness and low blood pressure.  She is feeling better.  She denies chest pain, shortness of breath, or heart palpitations.  No bleeding problems apixaban .  Current Medications: Active Medications[1]   Allergies:   Tikosyn  [dofetilide ], Codeine, and Vicodin [hydrocodone-acetaminophen ]   ROS:   Please see the history of present illness.    All other systems reviewed and are negative.  EKGs/Labs/Other Studies  Reviewed:    The following studies were reviewed today: Cardiac Studies & Procedures   ______________________________________________________________________________________________ CARDIAC CATHETERIZATION  CARDIAC CATHETERIZATION 04/05/2016  Conclusion 1. Severe aortic valve regurgitation 2. Widely patent coronary arteries with minimal irregularity of the LAD at the origin of the first diagonal 3. Normal right heart hemodynamics  Findings Coronary Findings Diagnostic  Dominance: Right  Left Anterior Descending The lesion is calcified. there is minimal irregularity of the proximal LAD at the origin of the first diagonal with associated calcification.  Ramus Intermedius Vessel is angiographically normal.  Left Circumflex Vessel is angiographically normal.  Right Coronary Artery Vessel is angiographically normal.  Intervention  No interventions have been documented.     ECHOCARDIOGRAM  ECHOCARDIOGRAM COMPLETE 02/14/2023  Narrative ECHOCARDIOGRAM REPORT    Patient Name:   Wanda [name]  Date of Exam: 02/14/2023 Medical Rec #:  [id number]     Height:       64.0 in Accession #:    [id number]    Weight:       145.0 lb Date of Birth:  12-27-1950      BSA:          1.706 m Patient Age:    72 years      BP:           120/80 mmHg Patient Gender: F             HR:           74 bpm. Exam Location:  [address]  Procedure: 2D Echo, Cardiac Doppler and Color Doppler  Indications:    I50.32 Heart Failure with Improved Ejection Fraction Z95.2 Aortic Valve Replacement  History:        Patient has prior history of Echocardiogram examinations, most recent 04/16/2021. CHF, AVR; Signs/Symptoms:Dyspnea. Nonischemic cardiomyopathy. PVC's. Aortic Valve: 21 mm Magna Ease pericardial valve is present in the aortic position. Procedure Date: 05/03/16.  Sonographer:    Carl Rodgers-Jones RDCS Referring Phys: [id number] Reata Petrov  IMPRESSIONS   1. S/P AVR with mean gradient 15  mmHg and trace AI. 2. Left ventricular ejection fraction, by estimation, is 60 to 65%. The left ventricle has normal function. The left ventricle has no regional wall motion abnormalities. Left ventricular diastolic parameters are consistent with Grade II diastolic dysfunction (pseudonormalization). 3. Right ventricular systolic function is normal. The right ventricular size is normal. 4. Left atrial size was mildly dilated. 5. The mitral valve is normal in structure. Mild mitral valve regurgitation. No evidence of mitral stenosis. 6. The aortic valve has been repaired/replaced. Aortic valve regurgitation is trivial. No aortic stenosis is present. There is a 21 mm Magna Ease pericardial valve present in the aortic position. Procedure Date: 05/03/16. 7. The inferior vena cava is normal in size with greater than 50% respiratory variability, suggesting right atrial pressure of 3 mmHg.  Comparison(s): No significant change from prior study.  FINDINGS Left Ventricle: Left ventricular ejection fraction, by estimation, is 60 to 65%. The left ventricle has normal function. The left ventricle has no regional wall motion abnormalities. The left ventricular internal cavity size was normal in size. There is no left ventricular hypertrophy. Left ventricular diastolic parameters are consistent with Grade II diastolic dysfunction (pseudonormalization).  Right Ventricle: The right ventricular size is normal. Right ventricular systolic function is normal.  Left Atrium: Left atrial size was mildly dilated.  Right Atrium: Right atrial size was normal in size.  Pericardium: There is no evidence of pericardial effusion.  Mitral Valve: The mitral valve is normal in structure. Mild mitral valve regurgitation. No evidence of mitral valve stenosis.  Tricuspid Valve: The tricuspid valve is normal in structure. Tricuspid valve regurgitation is trivial. No evidence of tricuspid stenosis.  The aortic valve has been  repaired/replaced. Aortic valve regurgitation is trivial. No aortic stenosis is present. There is a 21 mm Magna Ease pericardial valve present in the aortic position. Procedure Date: 05/03/16. Pulmonic Valve: The pulmonic valve was normal in structure. Pulmonic valve regurgitation is trivial. No evidence of pulmonic stenosis.  Aorta: The aortic root is normal in size and structure.  Venous: The inferior vena cava is normal in size with greater than 50% respiratory variability, suggesting right atrial pressure of 3 mmHg.  IAS/Shunts: No atrial level shunt detected by color flow Doppler.  Additional Comments: S/P AVR with mean gradient 15 mmHg and trace AI.   LEFT VENTRICLE PLAX 2D LVIDd:         4.40 cm Diastology LVIDs:         2.70 cm LV e' medial:    6.48 cm/s LV PW:         0.90 cm LV E/e' medial:  13.7 LV IVS:        0.90 cm LV e' lateral:   9.36 cm/s LV E/e' lateral: 9.5   RIGHT VENTRICLE             IVC RV Basal diam:  3.60 cm     IVC diam: 1.20 cm RV S prime:     10.55 cm/s TAPSE (M-mode): 1.7 cm  LEFT ATRIUM  Index        RIGHT ATRIUM          Index LA diam:        4.00 cm 2.34 cm/m   RA Area:     9.85 cm LA Vol (A2C):   65.1 ml 38.15 ml/m  RA Volume:   20.10 ml 11.78 ml/m LA Vol (A4C):   63.7 ml 37.33 ml/m LA Biplane Vol: 64.8 ml 37.97 ml/m AORTIC VALVE AV Vmax:           243.20 cm/s AV Vmean:          178.400 cm/s AV VTI:            0.499 m AV Peak Grad:      23.7 mmHg AV Mean Grad:      14.6 mmHg LVOT Vmax:         86.27 cm/s LVOT Vmean:        57.300 cm/s LVOT VTI:          0.177 m LVOT/AV VTI ratio: 0.35 AI PHT:            520 msec  AORTA Ao Root diam: 2.90 cm Ao Asc diam:  3.60 cm  MITRAL VALVE MV Area (PHT): 3.21 cm    SHUNTS MV Decel Time: 236 msec    Systemic VTI: 0.18 m MV E velocity: 88.60 cm/s MV A velocity: 76.25 cm/s MV E/A ratio:  1.16  Redell Shallow MD Electronically signed by Redell Shallow MD Signature Date/Time:  02/14/2023/11:55:25 AM    Final   TEE  ECHO TEE 06/28/2018  Narrative TRANSESOPHOGEAL ECHO REPORT    Patient Name:   RIANNAH STAGNER Date of Exam: 06/28/2018 Medical Rec #:  [id number]    Height:       64.0 in Accession #:    [id number]   Weight:       151.2 lb Date of Birth:  Apr 13, 1950     BSA:          1.74 m Patient Age:    68 years     BP:           94/75 mmHg Patient Gender: F            HR:           92 bpm. Exam Location:  Inpatient   Procedure: Transesophageal Echo  Indications:     Atrial fibrillation 427.31/I48.91  History:         Patient has prior history of Echocardiogram examinations, most recent 06/26/2018. CHF Atrial Fibrillation and PVC Aortic Valve Disease and Mitral Valve Disease Risk Factors: Former Smoker. Aortic Valve: A bioprosthetic aortic valve  Sonographer:     Rome Eans RDCS (AE) Referring Phys:  [id number] LEIM DEL NELSON Diagnosing Phys: Leim Moose MD    PROCEDURE: Patients was monitored while under deep sedation. The transesophogeal probe was passed through the esophogus of the patient. Image quality was excellent. The patient developed no complications during the procedure.  IMPRESSIONS   1. The left ventricle has severely reduced systolic function, with an ejection fraction of 20-25%. The cavity size was mildly dilated. Left ventricular diastolic function could not be evaluated. 2. The right ventricle has moderately reduced systolic function. Right ventricular systolic pressure normal. 3. Left atrial size was severely dilated. 4. Right atrial size was moderately dilated. 5. The mitral valve is grossly normal. Mild thickening of the mitral valve leaflet. Mitral valve regurgitation is moderate by color flow Doppler. 6.  A bioprosthesis valve is present in the aortic position. 7. Aortic valve regurgitation is trivial by color flow Doppler.  SUMMARY  LVEF severely decreased estimated at 20-25%. Moderately decreased RVEF. Bi-Atrial  dilatation. No thrombus in the left atrium or left atrial appendage. S/P AVR with a bioprosthesis, trivial AI. Moderate MR. Mild TR. FINDINGS Left Ventricle: The left ventricle has severely reduced systolic function, with an ejection fraction of 20-25%. The cavity size was mildly dilated. Left ventricular diastolic function could not be evaluated. Right Ventricle: The right ventricle has moderately reduced systolic function. Right ventricular systolic pressure normal. Left Atrium: Left atrial size was severely dilated. Right Atrium: Right atrial size was moderately dilated. Right atrial pressure is estimated at 10 mmHg. Mitral Valve: The mitral valve is grossly normal. Mild thickening of the mitral valve leaflet. Mitral valve regurgitation is moderate by color flow Doppler. Tricuspid Valve: The tricuspid valve was normal in structure. Tricuspid valve regurgitation is mild by color flow Doppler. Aortic Valve: Aortic valve regurgitation is trivial by color flow Doppler. A bioprosthetic aortic valve valve is present in the aortic position. Venous: The inferior vena cava was not well visualized.  Leim Moose MD Electronically signed by Leim Moose MD Signature Date/Time: 06/28/2018/2:42:09 PM    Final        ______________________________________________________________________________________________      EKG:   EKG Interpretation Date/Time:  Tuesday April 02 2024 08:56:06 EST Ventricular Rate:  77 PR Interval:  194 QRS Duration:  120 QT Interval:  428 QTC Calculation: 484 R Axis:   -22  Text Interpretation: Normal sinus rhythm Left ventricular hypertrophy with QRS widening and repolarization abnormality ( Cornell product ) When compared with ECG of 06-Apr-2023 14:33, T wave inversion no longer evident in Inferior leads T wave inversion no longer evident in Anterior leads T wave inversion now evident in Lateral leads Confirmed by Wonda Sharper [phone]) on 04/02/2024  9:15:49 AM    Recent Labs: 04/14/2023: ALT 12; BUN 17; Creatinine, Ser 1.24; Hemoglobin 14.1; Platelets 233; Potassium 4.0; Sodium 142  Recent Lipid Panel    Component Value Date/Time   CHOL 264 (H) 04/14/2023 1510   TRIG 83 04/14/2023 1510   HDL 93 04/14/2023 1510   CHOLHDL 2.8 04/14/2023 1510   CHOLHDL 2.3 08/18/2015 1600   VLDL 15 08/18/2015 1600   LDLCALC 157 (H) 04/14/2023 1510     Risk Assessment/Calculations:    CHA2DS2-VASc Score = 3   This indicates a 3.2% annual risk of stroke. The patient's score is based upon: CHF History: 1 HTN History: 0 Diabetes History: 0 Stroke History: 0 Vascular Disease History: 0 Age Score: 1 Gender Score: 1               Physical Exam:    VS:  BP 108/78   Pulse 77   Ht 5' 4 (1.626 m)   Wt 149 lb 6.4 oz (67.8 kg)   LMP 08/12/2004   SpO2 97%   BMI 25.64 kg/m     Wt Readings from Last 3 Encounters:  04/02/24 149 lb 6.4 oz (67.8 kg)  04/06/23 148 lb 3.2 oz (67.2 kg)  04/07/22 145 lb (65.8 kg)     GEN:  Well nourished, well developed in no acute distress HEENT: Normal NECK: No JVD; No carotid bruits LYMPHATICS: No lymphadenopathy CARDIAC: RRR, 2/6 systolic murmur at the right upper sternal border, no diastolic murmur RESPIRATORY:  Clear to auscultation without rales, wheezing or rhonchi  ABDOMEN: Soft, non-tender, non-distended MUSCULOSKELETAL:  No edema;  No deformity  SKIN: Warm and dry NEUROLOGIC:  Alert and oriented x 3 PSYCHIATRIC:  Normal affect   Assessment & Plan Persistent atrial fibrillation (HCC) Well-controlled after redo ablation.  Continue apixaban  for anticoagulation.  Agree with staying off of metoprolol  with her symptomatic hypotension when she takes this.  She has always run a low blood pressure. Heart failure with improved ejection fraction (HFimpEF) (HCC) Last assessment of LVEF by echo December 2024 showed normal ejection fraction of 60 to 65%, grade 2 diastolic dysfunction, normal RV function,  and normal function of her aortic valve bioprosthesis.  Repeat echocardiogram 1 year. Aortic valve disease Mean transvalvular gradient 15 mmHg with trace AI on last echocardiogram 1 year ago.  Patient follows SBE prophylaxis when indicated.  Recommend follow-up of her aortic prosthesis next year prior to her return office visit.  Her exam is unchanged.   The patient is clinically stable.  Will check a CBC and metabolic panel today to monitor blood counts on chronic oral anticoagulation and to monitor renal function and electrolytes on low-dose oral diuretic therapy.  I will see her back in 1 year with an echocardiogram prior to her office visit.  Medication Adjustments/Labs and Tests Ordered: Current medicines are reviewed at length with the patient today.  Concerns regarding medicines are outlined above.  Orders Placed This Encounter  Procedures   CBC   Comprehensive metabolic panel with GFR   EKG [id number]   ECHOCARDIOGRAM COMPLETE   No orders of the defined types were placed in this encounter.   Patient Instructions  Medication Instructions:  No medication changes were made at this visit. Continue current regimen.   *If you need a refill on your cardiac medications before your next appointment, please call your pharmacy*  Lab Work: To be completed today: CBC and CMP  If you have labs (blood work) drawn today and your tests are completely normal, you will receive your results only by: MyChart Message (if you have MyChart) OR A paper copy in the mail If you have any lab test that is abnormal or we need to change your treatment, we will call you to review the results.  Testing/Procedures: Your physician has requested that you have an echocardiogram prior to 1 year follow-up with Dr. Wonda. Echocardiography is a painless test that uses sound waves to create images of your heart. It provides your doctor with information about the size and shape of your heart and how well your hearts  chambers and valves are working. This procedure takes approximately one hour. There are no restrictions for this procedure. Please do NOT wear cologne, perfume, aftershave, or lotions (deodorant is allowed). Please arrive 15 minutes prior to your appointment time.  Please note: We ask at that you not bring children with you during ultrasound (echo/ vascular) testing. Due to room size and safety concerns, children are not allowed in the ultrasound rooms during exams. Our front office staff cannot provide observation of children in our lobby area while testing is being conducted. An adult accompanying a patient to their appointment will only be allowed in the ultrasound room at the discretion of the ultrasound technician under special circumstances. We apologize for any inconvenience.   Follow-Up: At [facility], you and your health needs are our priority.  As part of our continuing mission to provide you with exceptional heart care, our providers are all part of one team.  This team includes your primary Cardiologist (physician) and Advanced Practice Providers or APPs (Physician Assistants  and Nurse Practitioners) who all work together to provide you with the care you need, when you need it.  Your next appointment:   1 year(s)  Provider:   Ozell Fell, MD     Signed, Ozell Fell, MD  04/02/2024 10:18 AM    [facility]     [1]  Current Meds  Medication Sig   acetaminophen  (TYLENOL ) 500 MG tablet Take 500-1,000 mg by mouth every 6 (six) hours as needed (for pain.).   apixaban  (ELIQUIS ) 5 MG TABS tablet Take 1 tablet (5 mg total) by mouth 2 (two) times daily.   bimatoprost (LUMIGAN) 0.03 % ophthalmic solution Place 1 drop into both eyes at bedtime.   furosemide  (LASIX ) 20 MG tablet TAKE 1 TABLET EACH DAY.   hydrOXYzine (VISTARIL) 25 MG capsule Take 25 mg by mouth daily. (Patient taking differently: Take 25 mg by mouth every 8 (eight) hours as needed for anxiety.)    LORazepam  (ATIVAN ) 0.5 MG tablet Take 0.5 mg by mouth daily as needed for anxiety.   metoprolol  succinate (TOPROL  XL) 25 MG 24 hr tablet Take 0.5 tablets (12.5 mg total) by mouth daily.   rizatriptan  (MAXALT -MLT) 10 MG disintegrating tablet Take 1 tablet (10 mg total) by mouth as needed for migraine.   VYVANSE 50 MG capsule Take 50 mg by mouth every morning.   "

## 2024-04-02 NOTE — Patient Instructions (Signed)
 Medication Instructions:  No medication changes were made at this visit. Continue current regimen.   *If you need a refill on your cardiac medications before your next appointment, please call your pharmacy*  Lab Work: To be completed today: CBC and CMP  If you have labs (blood work) drawn today and your tests are completely normal, you will receive your results only by: MyChart Message (if you have MyChart) OR A paper copy in the mail If you have any lab test that is abnormal or we need to change your treatment, we will call you to review the results.  Testing/Procedures: Your physician has requested that you have an echocardiogram prior to 1 year follow-up with Dr. Wonda. Echocardiography is a painless test that uses sound waves to create images of your heart. It provides your doctor with information about the size and shape of your heart and how well your hearts chambers and valves are working. This procedure takes approximately one hour. There are no restrictions for this procedure. Please do NOT wear cologne, perfume, aftershave, or lotions (deodorant is allowed). Please arrive 15 minutes prior to your appointment time.  Please note: We ask at that you not bring children with you during ultrasound (echo/ vascular) testing. Due to room size and safety concerns, children are not allowed in the ultrasound rooms during exams. Our front office staff cannot provide observation of children in our lobby area while testing is being conducted. An adult accompanying a patient to their appointment will only be allowed in the ultrasound room at the discretion of the ultrasound technician under special circumstances. We apologize for any inconvenience.   Follow-Up: At [facility], you and your health needs are our priority.  As part of our continuing mission to provide you with exceptional heart care, our providers are all part of one team.  This team includes your primary Cardiologist  (physician) and Advanced Practice Providers or APPs (Physician Assistants and Nurse Practitioners) who all work together to provide you with the care you need, when you need it.  Your next appointment:   1 year(s)  Provider:   Ozell Wonda, MD

## 2024-04-03 ENCOUNTER — Ambulatory Visit: Payer: Self-pay | Admitting: Cardiovascular Disease

## 2024-04-03 LAB — COMPREHENSIVE METABOLIC PANEL WITH GFR
ALT: 11 IU/L (ref 0–32)
AST: 16 IU/L (ref 0–40)
Albumin: 4.5 g/dL (ref 3.8–4.8)
Alkaline Phosphatase: 69 IU/L (ref 49–135)
BUN/Creatinine Ratio: 19 (ref 12–28)
BUN: 23 mg/dL (ref 8–27)
Bilirubin Total: 0.3 mg/dL (ref 0.0–1.2)
CO2: 25 mmol/L (ref 20–29)
Calcium: 9.5 mg/dL (ref 8.7–10.3)
Chloride: 100 mmol/L (ref 96–106)
Creatinine, Ser: 1.23 mg/dL — ABNORMAL HIGH (ref 0.57–1.00)
Globulin, Total: 2.3 g/dL (ref 1.5–4.5)
Glucose: 75 mg/dL (ref 70–99)
Potassium: 4 mmol/L (ref 3.5–5.2)
Sodium: 141 mmol/L (ref 134–144)
Total Protein: 6.8 g/dL (ref 6.0–8.5)
eGFR: 47 mL/min/1.73 — ABNORMAL LOW

## 2024-04-03 LAB — CBC
Hematocrit: 40.5 % (ref 34.0–46.6)
Hemoglobin: 13.4 g/dL (ref 11.1–15.9)
MCH: 31.2 pg (ref 26.6–33.0)
MCHC: 33.1 g/dL (ref 31.5–35.7)
MCV: 94 fL (ref 79–97)
Platelets: 247 x10E3/uL (ref 150–450)
RBC: 4.3 x10E6/uL (ref 3.77–5.28)
RDW: 12.3 % (ref 11.7–15.4)
WBC: 4.9 x10E3/uL (ref 3.4–10.8)

## 2024-08-14 ENCOUNTER — Ambulatory Visit: Admitting: Cardiovascular Disease

## 2025-04-02 ENCOUNTER — Ambulatory Visit (HOSPITAL_COMMUNITY)
